# Patient Record
Sex: Male | Born: 1935
Health system: Southern US, Community
[De-identification: ages and names within clinical notes are randomized; demographics above are authoritative.]

## PROBLEM LIST (undated history)

## (undated) DIAGNOSIS — Z8711 Personal history of peptic ulcer disease: Secondary | ICD-10-CM

## (undated) DIAGNOSIS — N400 Enlarged prostate without lower urinary tract symptoms: Secondary | ICD-10-CM

## (undated) DIAGNOSIS — D494 Neoplasm of unspecified behavior of bladder: Secondary | ICD-10-CM

## (undated) DIAGNOSIS — IMO0001 Reserved for inherently not codable concepts without codable children: Secondary | ICD-10-CM

## (undated) DIAGNOSIS — Z8719 Personal history of other diseases of the digestive system: Secondary | ICD-10-CM

## (undated) DIAGNOSIS — E785 Hyperlipidemia, unspecified: Secondary | ICD-10-CM

## (undated) DIAGNOSIS — H9190 Unspecified hearing loss, unspecified ear: Secondary | ICD-10-CM

## (undated) DIAGNOSIS — I10 Essential (primary) hypertension: Secondary | ICD-10-CM

## (undated) DIAGNOSIS — M549 Dorsalgia, unspecified: Secondary | ICD-10-CM

## (undated) HISTORY — PX: TONSILLECTOMY: SUR1361

## (undated) HISTORY — DX: Hyperlipidemia, unspecified: E78.5

## (undated) HISTORY — DX: Essential (primary) hypertension: I10

## (undated) HISTORY — PX: CATARACT EXTRACTION W/ INTRAOCULAR LENS  IMPLANT, BILATERAL: SHX1307

---
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
## 2001-09-29 ENCOUNTER — Other Ambulatory Visit: Admission: RE | Admit: 2001-09-29 | Discharge: 2001-09-29 | Payer: Self-pay | Admitting: Internal Medicine

## 2004-12-24 ENCOUNTER — Ambulatory Visit: Payer: Self-pay | Admitting: Internal Medicine

## 2005-01-13 ENCOUNTER — Ambulatory Visit: Payer: Self-pay | Admitting: Internal Medicine

## 2005-01-20 ENCOUNTER — Encounter: Admission: RE | Admit: 2005-01-20 | Discharge: 2005-01-20 | Payer: Self-pay | Admitting: Internal Medicine

## 2009-12-27 ENCOUNTER — Encounter (INDEPENDENT_AMBULATORY_CARE_PROVIDER_SITE_OTHER): Payer: Self-pay | Admitting: *Deleted

## 2010-07-12 ENCOUNTER — Telehealth: Payer: Self-pay | Admitting: Internal Medicine

## 2011-01-10 ENCOUNTER — Encounter (INDEPENDENT_AMBULATORY_CARE_PROVIDER_SITE_OTHER): Payer: Self-pay | Admitting: *Deleted

## 2011-01-14 NOTE — Progress Notes (Signed)
Summary: Schedule Colonoscopy  Phone Note Outgoing Call Call back at Home Phone [phone]   Call placed by: Harlow Mares CMA Duncan Dull),  July 12, 2010 1:59 PM Call placed to: Patient Summary of Call: spoke to the pt and he will call back to schedule, he is not interested at this time.  Initial call taken by: Harlow Mares CMA (AAMA),  July 12, 2010 2:00 PM

## 2011-01-14 NOTE — Letter (Signed)
Summary: Colonoscopy Letter  [facility]  [address] [place], [place] [address]   Phone: [phone]  Fax: [phone]      December 27, 2009 MRN: [id number]   GABRYEL FILES [address] [place], [place]  [address]   Dear Mr. DOREN,   According to your medical record, it is time for you to schedule a Colonoscopy. The American Cancer Society recommends this procedure as a method to detect early colon cancer. Patients with a family history of colon cancer, or a personal history of colon polyps or inflammatory bowel disease are at increased risk.  This letter has beeen generated based on the recommendations made at the time of your procedure. If you feel that in your particular situation this may no longer apply, please contact our office.  Please call our office at [phone] to schedule this appointment or to update your records at your earliest convenience.  Thank you for cooperating with [place] to provide you with the very best care possible.   Sincerely,  Wilhemina Bonito. Marina Goodell, M.D.  [facility] [phone]

## 2011-01-16 NOTE — Letter (Signed)
Summary: Pre Visit Letter Revised  [facility]  [address] [place], [place] [address]   Phone: [phone]  Fax: [phone]        01/10/2011 MRN: [id number] Paul [name] [address] [place], [place]  [address]             Procedure Date:  02/11/2011 @ 10:30   Recall colon-Dr. Marina Goodell   Welcome to the Gastroenterology Division at [facility].    You are scheduled to see a nurse for your pre-procedure visit on 01/28/2011 at 11:00 on the 3rd floor at [facility], [address]  We ask that you try to arrive at our office 15 minutes prior to your appointment time to allow for check-in.  Please take a minute to review the attached form.  If you answer "Yes" to one or more of the questions on the first page, we ask that you call the person listed at your earliest opportunity.  If you answer "No" to all of the questions, please complete the rest of the form and bring it to your appointment.    Your nurse visit will consist of discussing your medical and surgical history, your immediate family medical history, and your medications.   If you are unable to list all of your medications on the form, please bring the medication bottles to your appointment and we will list them.  We will need to be aware of both prescribed and over the counter drugs.  We will need to know exact dosage information as well.    Please be prepared to read and sign documents such as consent forms, a financial agreement, and acknowledgement forms.  If necessary, and with your consent, a friend or relative is welcome to sit-in on the nurse visit with you.  Please bring your insurance card so that we may make a copy of it.  If your insurance requires a referral to see a specialist, please bring your referral form from your primary care physician.  No co-pay is required for this nurse visit.     If you cannot keep your appointment, please call [phone] to cancel or reschedule prior to  your appointment date.  This allows [place] the opportunity to schedule an appointment for another patient in need of care.    Thank you for choosing [facility] for your medical needs.  We appreciate the opportunity to care for you.  Please visit [place] at our website  to learn more about our practice.  Sincerely, The Gastroenterology Division

## 2011-01-27 ENCOUNTER — Encounter (INDEPENDENT_AMBULATORY_CARE_PROVIDER_SITE_OTHER): Payer: Self-pay | Admitting: *Deleted

## 2011-01-28 ENCOUNTER — Encounter: Payer: Self-pay | Admitting: Internal Medicine

## 2011-02-05 NOTE — Miscellaneous (Signed)
Summary: [facility] Previsit/prep  Clinical Lists Changes  Medications: Added new medication of MOVIPREP 100 GM  SOLR (PEG-KCL-NACL-NASULF-NA ASC-C) As per prep instructions. - Signed Rx of MOVIPREP 100 GM  SOLR (PEG-KCL-NACL-NASULF-NA ASC-C) As per prep instructions.;  #1 x 0;  Signed;  Entered by: [name] RN;  Authorized by: Hilarie Fredrickson MD;  Method used: Electronically to [facility] [address], [place], [place], [place]  [address], Ph: [phone], Fax: [phone] Observations: Added new observation of NKA: T (01/28/2011 10:47)    Prescriptions: MOVIPREP 100 GM  SOLR (PEG-KCL-NACL-NASULF-NA ASC-C) As per prep instructions.  #1 x 0   Entered by:   [name] RN   Authorized by:   Hilarie Fredrickson MD   Signed by:   [name] RN on 01/28/2011   Method used:   Electronically to        [facility] (retail)       [address]       [place], [place]  [address]       Ph: [phone]       Fax: [phone]   RxID:   [id number]

## 2011-02-05 NOTE — Letter (Signed)
Summary: [facility] Instructions  [facility]  [address] [place], [place] [address]   Phone: [phone]  Fax: [phone]       Paul [name]    [birth year]    MRN: [id number]        Procedure Day Dorna Bloom:  Paul [name]  02/11/11     Arrival Time:  9:30AM     Procedure Time:  10:30AM     Location of Procedure:                    _ X_  [facility] (4th Floor)  PREPARATION FOR COLONOSCOPY WITH MOVIPREP   Starting 5 days prior to your procedure 02/06/11 do not eat nuts, seeds, popcorn, corn, beans, peas,  salads, or any raw vegetables.  Do not take any fiber supplements (e.g. Metamucil, Citrucel, and Benefiber).  THE DAY BEFORE YOUR PROCEDURE         DATE: 02/10/11  DAY: MONDAY  1.  Drink clear liquids the entire day-NO SOLID FOOD  2.  Do not drink anything colored red or purple.  Avoid juices with pulp.  No orange juice.  3.  Drink at least 64 oz. (8 glasses) of fluid/clear liquids during the day to prevent dehydration and help the prep work efficiently.  CLEAR LIQUIDS INCLUDE: Water Jello Ice Popsicles Tea (sugar ok, no milk/cream) Powdered fruit flavored drinks Coffee (sugar ok, no milk/cream) Gatorade Juice: apple, white grape, white cranberry  Lemonade Clear bullion, consomm, broth Carbonated beverages (any kind) Strained chicken noodle soup Hard Candy                             4.  In the morning, mix first dose of MoviPrep solution:    Empty 1 Pouch A and 1 Pouch B into the disposable container    Add lukewarm drinking water to the top line of the container. Mix to dissolve    Refrigerate (mixed solution should be used within 24 hrs)  5.  Begin drinking the prep at 5:00 p.m. The MoviPrep container is divided by 4 marks.   Every 15 minutes drink the solution down to the next mark (approximately 8 oz) until the full liter is complete.   6.  Follow completed prep with 16 oz of clear liquid of your choice (Nothing red or purple).   Continue to drink clear liquids until bedtime.  7.  Before going to bed, mix second dose of MoviPrep solution:    Empty 1 Pouch A and 1 Pouch B into the disposable container    Add lukewarm drinking water to the top line of the container. Mix to dissolve    Refrigerate  THE DAY OF YOUR PROCEDURE      DATE: 02/11/11   DAY: TUESDAY  Beginning at 5:30AM (5 hours before procedure):         1. Every 15 minutes, drink the solution down to the next mark (approx 8 oz) until the full liter is complete.  2. Follow completed prep with 16 oz. of clear liquid of your choice.    3. You may drink clear liquids until 8:30AM (2 HOURS BEFORE PROCEDURE).   MEDICATION INSTRUCTIONS  Unless otherwise instructed, you should take regular prescription medications with a small sip of water   as early as possible the morning of your procedure.      OTHER INSTRUCTIONS  You will need a responsible adult at least 75 years  of age to accompany you and drive you home.   This person must remain in the waiting room during your procedure.  Wear loose fitting clothing that is easily removed.  Leave jewelry and other valuables at home.  However, you may wish to bring a book to read or  an iPod/MP3 player to listen to music as you wait for your procedure to start.  Remove all body piercing jewelry and leave at home.  Total time from sign-in until discharge is approximately 2-3 hours.  You should go home directly after your procedure and rest.  You can resume normal activities the  day after your procedure.  The day of your procedure you should not:   Drive   Make legal decisions   Operate machinery   Drink alcohol   Return to work  You will receive specific instructions about eating, activities and medications before you leave.    The above instructions have been reviewed and explained to me by  [name] RN  January 28, 2011 11:10 AM     I fully understand and can verbalize these  instructions _____________________________ Date _________

## 2011-02-11 ENCOUNTER — Other Ambulatory Visit: Payer: Self-pay | Admitting: Internal Medicine

## 2011-02-11 ENCOUNTER — Other Ambulatory Visit (AMBULATORY_SURGERY_CENTER): Payer: Medicare Other | Admitting: Internal Medicine

## 2011-02-11 DIAGNOSIS — Z86010 Personal history of colonic polyps: Secondary | ICD-10-CM

## 2011-02-11 DIAGNOSIS — D126 Benign neoplasm of colon, unspecified: Secondary | ICD-10-CM

## 2011-02-11 DIAGNOSIS — Z1211 Encounter for screening for malignant neoplasm of colon: Secondary | ICD-10-CM

## 2011-02-11 DIAGNOSIS — K5730 Diverticulosis of large intestine without perforation or abscess without bleeding: Secondary | ICD-10-CM

## 2011-02-17 ENCOUNTER — Encounter: Payer: Self-pay | Admitting: Internal Medicine

## 2011-02-20 NOTE — Procedures (Addendum)
Summary: Colonoscopy  Patient: Paul [name] Note: All result statuses are Final unless otherwise noted.  Tests: (1) Colonoscopy (COL)   COL Colonoscopy           DONE     [facility]     [address]     [place], [place]  [address]          COLONOSCOPY PROCEDURE REPORT          PATIENT:  [name], Paul  MR#:  [id number]     BIRTHDATE:  February 13, 1930, 80 yrs. old  GENDER:  male     ENDOSCOPIST:  Wilhemina Bonito. Eda Keys, MD     REF. BY:  Surveillance Program Recall,     PROCEDURE DATE:  02/11/2011     PROCEDURE:  Colonoscopy with snare polypectomy x 1     ASA CLASS:  Class II     INDICATIONS:  history of pre-cancerous (adenomatous) colon polyps,     surveillance and high-risk screening 2002, 2006 w/ TA     MEDICATIONS:   Fentanyl 50 mcg IV, Versed 5 mg IV          DESCRIPTION OF PROCEDURE:   After the risks benefits and     alternatives of the procedure were thoroughly explained, informed     consent was obtained.  Digital rectal exam was performed and     revealed no abnormalities.   The LB 180AL [id number] endoscope was     introduced through the anus and advanced to the cecum, which was     identified by both the appendix and ileocecal valve, without     limitations.Time to cecum =8:12 min.  The quality of the prep was     excellent, using MoviPrep.  The instrument was then slowly     withdrawn (time = 8:57 min) as the colon was fully examined.     <<PROCEDUREIMAGES>>          FINDINGS:  A diminutive polyp was found in the sigmoid colon.     Polyp was snared without cautery. Retrieval was successful.     Moderate diverticulosis was found in the left colon.  Otherwise     normal colonoscopy without other polyps, masses, vascular     ectasias, or inflammatory changes.   Retroflexed views in the     rectum revealed small  internal hemorrhoids.    The scope was then     withdrawn from the patient and the procedure completed.          COMPLICATIONS:  None       ENDOSCOPIC IMPRESSION:     1) Diminutive polyp in the sigmoid colon - removed     2) Moderate diverticulosis in the left colon     3) Otherwise normal colonoscopy     4) Internal hemorrhoids     RECOMMENDATIONS:     1) Return to the care of your primary provider. GI follow up as     needed          ______________________________     Wilhemina Bonito. Eda Keys, MD          CC:  Rodrigo Ran, MD;  The Patient          n.     eSIGNED:   Wilhemina Bonito. Eda Keys at 02/11/2011 11:57 AM          Coral Spikes, [id number]  Note: An exclamation mark (!) indicates a result that was not dispersed into  the flowsheet. Document Creation Date: 02/11/2011 11:58 AM _______________________________________________________________________  (1) Order result status: Final Collection or observation date-time: 02/11/2011 11:46 Requested date-time:  Receipt date-time:  Reported date-time:  Referring Physician:   Ordering Physician: Fransico Setters [phone]) Specimen Source:  Source: Launa Grill Order Number: [id number] Lab site:

## 2011-02-25 NOTE — Letter (Signed)
Summary: Patient Notice- Polyp Results  [facility]  [address] [place], [place] [address]   Phone: [phone]  Fax: [phone]        February 17, 2011 MRN: [id number]    Paul [name] [address] [place], [place]  [address]    Dear [name],  I am pleased to inform you that the colon polyp(s) removed during your recent colonoscopy was (were) found to be benign (no cancer detected) upon pathologic examination.    Additional information/recommendations:  __ No further action with gastroenterology is needed at this time. Please      follow-up with your primary care physician for your other healthcare      needs.  _  Please call us if you are having persistent problems or have questions about your condition that have not been fully answered at this time.  Sincerely,  Hilarie Fredrickson MD  This letter has been electronically signed by your physician.  Appended Document: Patient Notice- Polyp Results letter mailed

## 2012-01-16 ENCOUNTER — Other Ambulatory Visit: Payer: Self-pay | Admitting: Urology

## 2012-01-17 MED ORDER — MITOMYCIN CHEMO FOR BLADDER INSTILLATION 40 MG: 40.0000 mg | Freq: Once | INTRAVENOUS | Status: DC

## 2012-01-21 ENCOUNTER — Encounter (HOSPITAL_BASED_OUTPATIENT_CLINIC_OR_DEPARTMENT_OTHER): Payer: Self-pay | Admitting: *Deleted

## 2012-01-21 NOTE — Progress Notes (Signed)
NPO AFTER MN. ARRIVES AT 1015. NEEDS ISTAT AND EKG. MAY TAKE TYLENOL IF NEEDED W/ SIP OF WATER.

## 2012-01-25 DIAGNOSIS — C679 Malignant neoplasm of bladder, unspecified: Secondary | ICD-10-CM

## 2012-01-26 ENCOUNTER — Encounter (HOSPITAL_BASED_OUTPATIENT_CLINIC_OR_DEPARTMENT_OTHER): Payer: Self-pay | Admitting: Anesthesiology

## 2012-01-26 ENCOUNTER — Ambulatory Visit (HOSPITAL_BASED_OUTPATIENT_CLINIC_OR_DEPARTMENT_OTHER)
Admission: RE | Admit: 2012-01-26 | Discharge: 2012-01-26 | Disposition: A | Discharge status: Home / Self Care | Payer: Medicare Other | Source: Ambulatory Visit | Attending: Urology | Admitting: Urology

## 2012-01-26 ENCOUNTER — Ambulatory Visit (HOSPITAL_BASED_OUTPATIENT_CLINIC_OR_DEPARTMENT_OTHER): Payer: Medicare Other | Admitting: Anesthesiology

## 2012-01-26 ENCOUNTER — Other Ambulatory Visit: Payer: Self-pay

## 2012-01-26 ENCOUNTER — Encounter (HOSPITAL_BASED_OUTPATIENT_CLINIC_OR_DEPARTMENT_OTHER)
Admission: RE | Disposition: A | Discharge status: Home / Self Care | Payer: Self-pay | Source: Ambulatory Visit | Attending: Urology

## 2012-01-26 ENCOUNTER — Other Ambulatory Visit: Payer: Self-pay | Admitting: Urology

## 2012-01-26 ENCOUNTER — Encounter (HOSPITAL_BASED_OUTPATIENT_CLINIC_OR_DEPARTMENT_OTHER): Payer: Self-pay | Admitting: *Deleted

## 2012-01-26 DIAGNOSIS — I10 Essential (primary) hypertension: Secondary | ICD-10-CM | POA: Insufficient documentation

## 2012-01-26 DIAGNOSIS — Z7982 Long term (current) use of aspirin: Secondary | ICD-10-CM | POA: Insufficient documentation

## 2012-01-26 DIAGNOSIS — C679 Malignant neoplasm of bladder, unspecified: Secondary | ICD-10-CM

## 2012-01-26 DIAGNOSIS — E7800 Pure hypercholesterolemia, unspecified: Secondary | ICD-10-CM | POA: Insufficient documentation

## 2012-01-26 DIAGNOSIS — Z79899 Other long term (current) drug therapy: Secondary | ICD-10-CM | POA: Insufficient documentation

## 2012-01-26 DIAGNOSIS — R3129 Other microscopic hematuria: Secondary | ICD-10-CM | POA: Insufficient documentation

## 2012-01-26 HISTORY — DX: Unspecified hearing loss, unspecified ear: H91.90

## 2012-01-26 HISTORY — DX: Neoplasm of unspecified behavior of bladder: D49.4

## 2012-01-26 HISTORY — DX: Benign prostatic hyperplasia without lower urinary tract symptoms: N40.0

## 2012-01-26 HISTORY — PX: TRANSURETHRAL RESECTION OF BLADDER TUMOR: SHX2575

## 2012-01-26 HISTORY — DX: Personal history of peptic ulcer disease: Z87.11

## 2012-01-26 HISTORY — DX: Personal history of other diseases of the digestive system: Z87.19

## 2012-01-26 HISTORY — DX: Reserved for inherently not codable concepts without codable children: IMO0001

## 2012-01-26 HISTORY — DX: Dorsalgia, unspecified: M54.9

## 2012-01-26 LAB — POCT I-STAT 4, (NA,K, GLUC, HGB,HCT)
Glucose, Bld: 100 mg/dL — ABNORMAL HIGH (ref 70–99)
HCT: 42 % (ref 39.0–52.0)
Hemoglobin: 14.3 g/dL (ref 13.0–17.0)
Potassium: 4.2 mEq/L (ref 3.5–5.1)
Sodium: 141 mEq/L (ref 135–145)

## 2012-01-26 SURGERY — TURBT (TRANSURETHRAL RESECTION OF BLADDER TUMOR)
Anesthesia: General | Site: Bladder | Wound class: Clean Contaminated

## 2012-01-26 MED ORDER — OXYBUTYNIN CHLORIDE 5 MG PO TABS: 5.0000 mg | ORAL_TABLET | Freq: Once | ORAL | Status: DC

## 2012-01-26 MED ORDER — PHENAZOPYRIDINE HCL 200 MG PO TABS: 200.0000 mg | ORAL_TABLET | Freq: Three times a day (TID) | ORAL | Status: AC | PRN

## 2012-01-26 MED ORDER — STERILE WATER FOR IRRIGATION IR SOLN
Status: DC | PRN
  Administered 2012-01-26: 10 mL

## 2012-01-26 MED ORDER — PROPOFOL 10 MG/ML IV EMUL
INTRAVENOUS | Status: DC | PRN
  Administered 2012-01-26: 190 mg via INTRAVENOUS

## 2012-01-26 MED ORDER — PHENAZOPYRIDINE HCL 200 MG PO TABS
200.0000 mg | ORAL_TABLET | Freq: Once | ORAL | Status: AC
  Administered 2012-01-26: 200 mg via ORAL

## 2012-01-26 MED ORDER — SODIUM CHLORIDE 0.9 % IR SOLN
Status: DC | PRN
  Administered 2012-01-26: 6000 mL

## 2012-01-26 MED ORDER — MITOMYCIN CHEMO FOR BLADDER INSTILLATION 40 MG
40.0000 mg | Freq: Once | INTRAVENOUS | Status: AC
  Administered 2012-01-26: 40 mg via INTRAVESICAL
  Filled 2012-01-26: qty 40

## 2012-01-26 MED ORDER — HYDROCODONE-ACETAMINOPHEN 10-325 MG PO TABS
1.0000 | ORAL_TABLET | Freq: Four times a day (QID) | ORAL | Status: DC | PRN
  Administered 2012-01-26: 1 via ORAL

## 2012-01-26 MED ORDER — LACTATED RINGERS IV SOLN: INTRAVENOUS | Status: DC

## 2012-01-26 MED ORDER — MEPERIDINE HCL 25 MG/ML IJ SOLN: 6.2500 mg | INTRAMUSCULAR | Status: DC | PRN

## 2012-01-26 MED ORDER — ONDANSETRON HCL 4 MG/2ML IJ SOLN
INTRAMUSCULAR | Status: DC | PRN
  Administered 2012-01-26: 4 mg via INTRAVENOUS

## 2012-01-26 MED ORDER — PROMETHAZINE HCL 25 MG/ML IJ SOLN: 6.2500 mg | INTRAMUSCULAR | Status: DC | PRN

## 2012-01-26 MED ORDER — CIPROFLOXACIN IN D5W 200 MG/100ML IV SOLN
200.0000 mg | INTRAVENOUS | Status: AC
  Administered 2012-01-26: 200 mg via INTRAVENOUS

## 2012-01-26 MED ORDER — HYDROCODONE-ACETAMINOPHEN 10-300 MG PO TABS: 1.0000 | ORAL_TABLET | Freq: Four times a day (QID) | ORAL | Status: DC | PRN

## 2012-01-26 MED ORDER — LACTATED RINGERS IV SOLN
INTRAVENOUS | Status: DC
  Administered 2012-01-26 (×2): via INTRAVENOUS

## 2012-01-26 MED ORDER — FENTANYL CITRATE 0.05 MG/ML IJ SOLN: 25.0000 ug | INTRAMUSCULAR | Status: DC | PRN

## 2012-01-26 MED ORDER — LIDOCAINE HCL (CARDIAC) 20 MG/ML IV SOLN
INTRAVENOUS | Status: DC | PRN
  Administered 2012-01-26: 80 mg via INTRAVENOUS

## 2012-01-26 MED ORDER — FENTANYL CITRATE 0.05 MG/ML IJ SOLN
INTRAMUSCULAR | Status: DC | PRN
  Administered 2012-01-26: 50 ug via INTRAVENOUS

## 2012-01-26 MED ORDER — TAMSULOSIN HCL 0.4 MG PO CAPS: 0.4000 mg | ORAL_CAPSULE | Freq: Once | ORAL | Status: DC

## 2012-01-26 SURGICAL SUPPLY — 30 items
BAG DRAIN URO-CYSTO SKYTR STRL (DRAIN) ×2 IMPLANT
BAG URINE DRAINAGE (UROLOGICAL SUPPLIES) ×2 IMPLANT
BAG URINE LEG 19OZ MD ST LTX (BAG) IMPLANT
CANISTER SUCT LVC 12 LTR MEDI- (MISCELLANEOUS) ×2 IMPLANT
CATH COUDE FOLEY 2W 5CC 20FR (CATHETERS) ×2 IMPLANT
CATH FOLEY 2WAY SLVR  5CC 20FR (CATHETERS)
CATH FOLEY 2WAY SLVR  5CC 22FR (CATHETERS)
CATH FOLEY 2WAY SLVR  5CC 24FR (CATHETERS) ×1
CATH FOLEY 2WAY SLVR 5CC 20FR (CATHETERS) IMPLANT
CATH FOLEY 2WAY SLVR 5CC 22FR (CATHETERS) IMPLANT
CATH FOLEY 2WAY SLVR 5CC 24FR (CATHETERS) ×1 IMPLANT
CLOTH BEACON ORANGE TIMEOUT ST (SAFETY) ×2 IMPLANT
DRAPE CAMERA CLOSED 9X96 (DRAPES) ×2 IMPLANT
ELECT BUTTON BIOP 24F 90D PLAS (MISCELLANEOUS) IMPLANT
ELECT LOOP HF 26F 30D .35MM (CUTTING LOOP) IMPLANT
ELECT REM PT RETURN 9FT ADLT (ELECTROSURGICAL) ×2
ELECTRODE REM PT RTRN 9FT ADLT (ELECTROSURGICAL) ×1 IMPLANT
EVACUATOR MICROVAS BLADDER (UROLOGICAL SUPPLIES) ×2 IMPLANT
GLOVE BIO SURGEON STRL SZ8 (GLOVE) ×2 IMPLANT
GOWN PREVENTION PLUS LG XLONG (DISPOSABLE) ×2 IMPLANT
GOWN STRL REIN XL XLG (GOWN DISPOSABLE) ×2 IMPLANT
HOLDER FOLEY CATH W/STRAP (MISCELLANEOUS) ×2 IMPLANT
IV NS IRRIG 3000ML ARTHROMATIC (IV SOLUTION) ×6 IMPLANT
KIT ASPIRATION TUBING (SET/KITS/TRAYS/PACK) ×2 IMPLANT
LOOP CUTTING 24FR OLYMPUS (CUTTING LOOP) ×2 IMPLANT
PACK CYSTOSCOPY (CUSTOM PROCEDURE TRAY) ×2 IMPLANT
PLUG CATH AND CAP STER (CATHETERS) IMPLANT
SET ASPIRATION TUBING (TUBING) IMPLANT
SYRINGE IRR TOOMEY STRL 70CC (SYRINGE) ×2 IMPLANT
WATER STERILE IRR 3000ML UROMA (IV SOLUTION) ×2 IMPLANT

## 2012-01-26 NOTE — Transfer of Care (Signed)
Immediate Anesthesia Transfer of Care Note  Patient: Paul [name]  Procedure(s) Performed:  TRANSURETHRAL RESECTION OF BLADDER TUMOR (TURBT) - GYRUS MYTOMICIN C   Patient Location: PACU  Anesthesia Type: General  Level of Consciousness: sedated  Airway & Oxygen Therapy: Patient Spontanous Breathing and Patient connected to nasal cannula oxygen  Post-op Assessment: Report given to PACU RN and Post -op Vital signs reviewed and stable  Post vital signs: Reviewed and stable  Complications: No apparent anesthesia complications

## 2012-01-26 NOTE — Op Note (Signed)
PATIENT:  Coral Spikes  PRE-OPERATIVE DIAGNOSIS: Bladder tumor  POST-OPERATIVE DIAGNOSIS: Same  PROCEDURE:  Procedure(s): TRANSURETHRAL RESECTION OF BLADDER TUMOR (TURBT) (1.6cm.)  SURGEON:  Surgeon(s): Garnett Farm  ANESTHESIA:   General  EBL:  less than 50 mL  DRAINS: Urinary Catheter (20 Fr. Foley)   SPECIMEN:  Source of Specimen: 1. Bladder tumor 2. Base of bladder tumor  DISPOSITION OF SPECIMEN:  PATHOLOGY  Indication:  Mr. Delker is a 78 year old male patient who was referred for microscopic hematuria. He was found on evaluation to have no abnormality of the kidneys or ureters. Cystoscopically I noted a tumor on the superior wall of the bladder on the left-hand side. We therefore discussed resection of the tumor for staging and grading purposes.  Description of operation: The patient was taken to the operating room and administered general anesthesia. He was then placed on the table and moved to the dorsal lithotomy position after which his genitalia was sterilely prepped and draped. An official timeout was then performed.  I noted his urethral meatus was somewhat snug and therefore dilated the meatus with [employer] sounds from 24 up to 28 [place]. The 26 French resectoscope with Timberlake obturator was then introduced into the bladder and the obturator was removed. The resectoscope element with 12 lens was then inserted and the bladder was fully and systematically inspected. Ureteral orifices were noted to be in the normal anatomic positions. 2+ trabeculation was noted. The tumor was identified on the superior wall bladder on the left-hand side. No other lesions were identified within the bladder.  I first began by resecting the papillary portion of the tumor down to the bladder wall. I then used the Microvasive evacuator to remove all the portions of the bladder tumor that were resected. I then resected the base of the bladder tumor and fulgurated the base as well as  surrounding mucosa. Reinspection of the bladder revealed all obvious tumor had been fully resected and there was no evidence of perforation. The Microvasive evacuator was then used to irrigate the bladder and remove all of the portions of tissue which were sent to pathology. I then removed the resectoscope.  A 20 French Foley catheter was then inserted in the bladder and irrigated. The irrigant returned slightly pink with no clots. The catheter was placed on mild traction. The patient was taken to recovery room in stable and satisfactory condition. He tolerated procedure well and there were no intraoperative complications.   In the recovery room he was instilled with 40 mg of mitomycin-C in 40 cc of water and the catheter was plugged. This will remain indwelling for approximately one hour. It will then be drained from the bladder and the catheter will be removed and the patient discharged home.  PLAN OF CARE: Discharge to home after PACU  PATIENT DISPOSITION:  PACU - hemodynamically stable.

## 2012-01-26 NOTE — Anesthesia Procedure Notes (Signed)
Procedure Name: LMA Insertion Performed by: Helyne Genther Pre-anesthesia Checklist: Patient identified, Emergency Drugs available, Suction available and Patient being monitored Patient Re-evaluated:Patient Re-evaluated prior to inductionOxygen Delivery Method: Circle System Utilized Preoxygenation: Pre-oxygenation with 100% oxygen Intubation Type: IV induction Ventilation: Mask ventilation without difficulty LMA: LMA inserted LMA Size: 4.0 Number of attempts: 1 Placement Confirmation: positive ETCO2 Tube secured with: Tape Dental Injury: Teeth and Oropharynx as per pre-operative assessment      

## 2012-01-26 NOTE — Anesthesia Preprocedure Evaluation (Addendum)
Anesthesia Evaluation  Patient identified by MRN, date of birth, ID band Patient awake    Reviewed: Allergy & Precautions, H&P , NPO status , Patient's Chart, lab work & pertinent test results  Airway Mallampati: II TM Distance: >3 FB Neck ROM: Full    Dental No notable dental hx. (+) Partial Lower and Partial Upper   Pulmonary neg pulmonary ROS,  clear to auscultation  Pulmonary exam normal       Cardiovascular neg cardio ROS Regular Normal    Neuro/Psych Negative Neurological ROS  Negative Psych ROS   GI/Hepatic negative GI ROS, Neg liver ROS,   Endo/Other  Negative Endocrine ROS  Renal/GU negative Renal ROS  Genitourinary negative   Musculoskeletal negative musculoskeletal ROS (+)   Abdominal   Peds negative pediatric ROS (+)  Hematology negative hematology ROS (+)   Anesthesia Other Findings   Reproductive/Obstetrics negative OB ROS                          Anesthesia Physical Anesthesia Plan  ASA: II  Anesthesia Plan: General   Post-op Pain Management:    Induction: Intravenous  Airway Management Planned:   Additional Equipment:   Intra-op Plan:   Post-operative Plan: Extubation in OR  Informed Consent: I have reviewed the patients History and Physical, chart, labs and discussed the procedure including the risks, benefits and alternatives for the proposed anesthesia with the patient or authorized representative who has indicated his/her understanding and acceptance.   Dental advisory given  Plan Discussed with: CRNA  Anesthesia Plan Comments:         Anesthesia Quick Evaluation

## 2012-01-26 NOTE — Anesthesia Postprocedure Evaluation (Signed)
  Anesthesia Post-op Note  Patient: Paul [name]  Procedure(s) Performed:  TRANSURETHRAL RESECTION OF BLADDER TUMOR (TURBT) - GYRUS MYTOMICIN C   Patient Location: PACU  Anesthesia Type: General  Level of Consciousness: awake and alert   Airway and Oxygen Therapy: Patient Spontanous Breathing  Post-op Pain: mild  Post-op Assessment: Post-op Vital signs reviewed, Patient's Cardiovascular Status Stable, Respiratory Function Stable, Patent Airway and No signs of Nausea or vomiting  Post-op Vital Signs: stable  Complications: No apparent anesthesia complications

## 2012-01-26 NOTE — H&P (Signed)
History of Present Illness          Paul [name] is a 78 year old male patient who returns for followup of persistent microscopic hematuria. He was found in 12/12 and a normal creatinine of 0.9, a serum calcium of 9.8 and a PSA on finasteride of 0.35. He has never seen any gross hematuria nor has he every been told he has had microscopic hematuria in the past. He has no history of kidney stones although his father has had kidney stones in the past. He has no voiding symptoms either irritative or obstructive at this time.   Interval history: Since I seen him last he has not noted any gross hematuria nor has he had any new irritative voiding symptoms or passed anything that he would describe his tissue or blood clots.   Past Medical History Problems  1. History of  Hypercholesterolemia 272.0 2. History of  Hypertension 401.9  Surgical History Problems  1. History of  Appendectomy 2. History of  Tonsillectomy  Current Meds 1. Aspirin 81 MG Oral Tablet; Therapy: (Recorded:17Jan2013) to 2. Finasteride 5 MG Oral Tablet; Therapy: (Recorded:17Jan2013) to 3. Lisinopril 20 MG Oral Tablet; Therapy: (Recorded:17Jan2013) to 4. Simvastatin 20 MG Oral Tablet; Therapy: (Recorded:17Jan2013) to 5. Zoloft 100 MG Oral Tablet; Therapy: (Recorded:17Jan2013) to  Allergies Medication  1. No Known Drug Allergies  Family History Problems  1. Family history of  Death In The Family Father 2. Family history of  Death In The Family Mother 3. Family history of  Family Health Status Children ___ Living Daughters 4. Family history of  Family Health Status Children ___ Living Sons 5. Family history of  Lung Cancer V16.1 6. Family history of  Nephrolithiasis  Social History Problems  1. Caffeine Use 2. Marital History - Currently Married 3. Never A Smoker 4. Occupation: Retired Nurse, children's  5. History of  Alcohol Use  Review of Systems Genitourinary, constitutional, skin, eye, otolaryngeal,  hematologic/lymphatic, cardiovascular, pulmonary, endocrine, musculoskeletal, gastrointestinal, neurological and psychiatric system(s) were reviewed and pertinent findings if present are noted.  Genitourinary: hematuria.  Gastrointestinal: diarrhea.  Hematologic/Lymphatic: a tendency to easily bruise.  Musculoskeletal: back pain and joint pain.  Neurological: dizziness.    Vitals Vital Signs BMI Calculated: 24.55 BSA Calculated: 1.87 Height: 5 ft 8 in Weight: 162 lb  Blood Pressure: 146 / 83 Heart Rate: 83  Physical Exam Constitutional: Well nourished and well developed . No acute distress.  ENT:. The ears and nose are normal in appearance.  Neck: The appearance of the neck is normal and no neck mass is present.  Pulmonary: No respiratory distress and normal respiratory rhythm and effort.  Cardiovascular: Heart rate and rhythm are normal . No peripheral edema.  Abdomen: The abdomen is soft and nontender. No masses are palpated. No CVA tenderness. No hernias are palpable. No hepatosplenomegaly noted.  Rectal: Rectal exam demonstrates normal sphincter tone, no tenderness and no masses. The prostate has no nodularity and is not tender. The left seminal vesicle is nonpalpable. The right seminal vesicle is nonpalpable. The perineum is normal on inspection.  Genitourinary: Examination of the penis demonstrates no discharge, no masses, no lesions and a normal meatus. The scrotum is without lesions. The right epididymis is palpably normal and non-tender. The left epididymis is palpably normal and non-tender. The right testis is non-tender and without masses. The left testis is non-tender and without masses.  Lymphatics: The femoral and inguinal nodes are not enlarged or tender.  Skin: Normal skin turgor, no visible rash and  no visible skin lesions.  Neuro/Psych:. Mood and affect are appropriate.   AU CT-HEMATURIA PROTOCOL 21Jan2013 12:00AM Paul [name]   Test Name Result Flag Reference  **  RADIOLOGY REPORT BY [facility] ** ORIGINAL APPROVED BY: Genevive Bi, M.D. ON: 01/05/2012 16:27:22   *RADIOLOGY REPORT*  Clinical Data: Microscopic hematuria.  CT ABDOMEN AND PELVIS WITHOUT AND WITH CONTRAST  Technique: Multidetector CT imaging of the abdomen and pelvis was performed without contrast material in one or both body regions, followed by contrast material(s) and further sections in one or both body regions.  Contrast: 125 ml Isovue  Comparison: None.  Findings:  Renal: No nephrolithiasis or ureterolithiasis. No enhancing renal cortical lesion are present. Delayed pyelogram phase imaging demonstrates no filling defects within the collecting systems or ureters. Within the left anterior wall the bladder, there is a rounded filling defect with frond like projections measuring 16 mm x 16 mm (image 72, series 6). The lesions enhances on the earlier contrast series (image 69, series 3).  There is lung bases are clear. There is a small hypodense lesion in the right hepatic lobe measuring 5 mm (image 22) which is too small to characterize. The gallbladder, pancreas, spleen, adrenal glands are normal.  The stomach, small bowel, and colon show no acute findings. There are diverticula of the sigmoid colon.  Abdominal aorta is heavily calcified but nonaneurysmal. No retroperitoneal lymphadenopathy.  Prostate gland is mildly enlarged at 53 mm. Enhancing lesion within the left anterior wall the bladder as described above. No pelvic lymphadenopathy. Review of bone windows demonstrates no aggressive osseous lesions.  IMPRESSION:  1.. Enhancing lesion within the left anterior wall of the bladder with frond like projections is concerning for a bladder neoplasm. Recommend cystoscopy for further evaluation. 2. No evidence of filling defects within the renal collecting systems ureters.  3. Small hypodense lesion within the right hepatic lobe is too small to  characterize. 4. No evidence of lymphadenopathy. 5. Prostate hypertrophy    Assessment Assessed  1. Working diagnosis of  Transitional Cell Carcinoma Of The Bladder 188.9 2. Benign Prostatic Hypertrophy With Urinary Obstruction 600.01   I went over his CT scan results with him today which is revealed no abnormality of the upper tract. Cystoscopically I found a bladder tumor on the posterior left wall bladder that appears to be a papillary transitional cell carcinoma. I went over the fact that this needs to be resected and that it is almost certainly malignant. The resection will allow both grading and staging of the tumor. I then went over the procedure in detail including its risks and complications. We discussed the alternatives and the probability of success. He understands and has elected to proceed.   Plan   1. I'm going to have him stop his aspirin until after the surgery. 2. He will be scheduled for outpatient transurethral resection of his bladder tumor.

## 2012-01-27 ENCOUNTER — Encounter (HOSPITAL_BASED_OUTPATIENT_CLINIC_OR_DEPARTMENT_OTHER): Payer: Self-pay | Admitting: Urology

## 2012-01-27 NOTE — Progress Notes (Signed)
Patient having difficulty passing his urine has called Dr. Vernie Ammons office  And spoke with his nurse

## 2012-02-05 ENCOUNTER — Other Ambulatory Visit: Payer: Self-pay | Admitting: Urology

## 2012-02-27 ENCOUNTER — Encounter (HOSPITAL_BASED_OUTPATIENT_CLINIC_OR_DEPARTMENT_OTHER): Payer: Self-pay | Admitting: *Deleted

## 2012-02-27 NOTE — Progress Notes (Signed)
To [facility] at 0615.Istat on arrival,Ekg in epic chart. Npo after mn.

## 2012-03-05 NOTE — H&P (Signed)
History of Present Illness     Transitional cell carcinoma of the bladder: He was evaluated for the source of microscopic hematuria and underwent a CT scan which revealed normal upper tracts and cystoscopy which revealed an obvious tumor in his bladder. Cystoscopically I noted a tumor on the left superior wall of the bladder which was resected on 01/26/12. His pathology revealed high-grade transitional cell carcinoma but there was no evidence of stromal or muscular invasion (Ta,G3).  Interval history:he has tolerated his catheter well. He returns today to undergo voiding trial after having been placed on an alpha-blocker in addition to his 5 alpha reductase inhibitor. He's been tolerating his catheter well.   Past Medical History Problems  1. History of  Acute Urinary Retention 788.20 2. History of  Hypercholesterolemia 272.0 3. History of  Hypertension 401.9  Surgical History Problems  1. History of  Appendectomy 2. History of  Tonsillectomy  Current Meds 1. Aspirin 81 MG Oral Tablet; Therapy: (Recorded:17Jan2013) to 2. Finasteride 5 MG Oral Tablet; Therapy: (Recorded:17Jan2013) to 3. Lisinopril 20 MG Oral Tablet; Therapy: (Recorded:17Jan2013) to 4. Simvastatin 20 MG Oral Tablet; Therapy: (Recorded:17Jan2013) to 5. Zoloft 100 MG Oral Tablet; Therapy: (Recorded:17Jan2013) to  Allergies Medication  1. No Known Drug Allergies  Family History Problems  1. Family history of  Death In The Family Father 2. Family history of  Death In The Family Mother 3. Family history of  Family Health Status Children ___ Living Daughters 4. Family history of  Family Health Status Children ___ Living Sons 5. Family history of  Lung Cancer V16.1 6. Family history of  Nephrolithiasis  Social History Problems  1. Caffeine Use 2. Marital History - Currently Married 3. Never A Smoker 4. Occupation: Retired Nurse, children's  5. History of  Alcohol Use  Review of Systems Genitourinary, constitutional, skin,  eye, otolaryngeal, hematologic/lymphatic, cardiovascular, pulmonary, endocrine, musculoskeletal, gastrointestinal, neurological and psychiatric system(s) were reviewed and pertinent findings if present are noted.  Genitourinary: hematuria.  Gastrointestinal: diarrhea.  Hematologic/Lymphatic: a tendency to easily bruise.  Musculoskeletal: back pain and joint pain.  Neurological: dizziness.    Vitals Vital Signs BMI Calculated: 24.55 BSA Calculated: 1.87 Height: 5 ft 8 in Weight: 162 lb  Blood Pressure: 146 / 83 Heart Rate: 83  Physical Exam Constitutional: Well nourished and well developed . No acute distress.  ENT:. The ears and nose are normal in appearance.  Neck: The appearance of the neck is normal and no neck mass is present.  Pulmonary: No respiratory distress and normal respiratory rhythm and effort.  Cardiovascular: Heart rate and rhythm are normal . No peripheral edema.  Abdomen: The abdomen is soft and nontender. No masses are palpated. No CVA tenderness. No hernias are palpable. No hepatosplenomegaly noted.  Rectal: Rectal exam demonstrates normal sphincter tone, no tenderness and no masses. The prostate has no nodularity and is not tender. The left seminal vesicle is nonpalpable. The right seminal vesicle is nonpalpable. The perineum is normal on inspection.  Genitourinary: Examination of the penis demonstrates no discharge, no masses, no lesions and a normal meatus. The scrotum is without lesions. The right epididymis is palpably normal and non-tender. The left epididymis is palpably normal and non-tender. The right testis is non-tender and without masses. The left testis is non-tender and without masses.  Lymphatics: The femoral and inguinal nodes are not enlarged or tender.  Skin: Normal skin turgor, no visible rash and no visible skin lesions.  Neuro/Psych:. Mood and affect are appropriate.    Results/Data  The following images/tracing/specimen were independently  visualized:  Flow rate as below.  Flow Rate: Instilled volume 250 ml . Voided 214 ml. A peak flow rate of 36ml/s, mean flow rate of 53ml/s and Bell-shaped flow curve .    Assessment Assessed  1. Acute Urinary Retention 788.20 2. Transitional Cell Carcinoma Of The Bladder 188.9 3. Benign Prostatic Hypertrophy With Urinary Obstruction 600.01      We will go over his pathology report today which has revealed high-grade transitional cell carcinoma with no evidence of stromal invasion (Ta,G3). He received mitomycin-C postoperatively. We discussed the fact that with a high grade tumor such as this and he repeat biopsy of the bladder at the site of resection is indicated in order to both be sure of complete clearance of the tumor as well as document the absence of muscular invasion. If in fact there is no evidence of invasion then he would benefit from an induction course of BCG followed by maintenance.  Because he developed urinary retention I have recommended he remain on tamsulosin in addition to his finasteride. We then discussed the fact that when I go back in for my repeat biopsy of his bladder there is again the risk of retention although that risk would probably be less since he is on an alpha-blocker as well. We discussed transurethral resection of the prostate which I think is more than is necessary but he would very possibly benefit from a transurethral incision of his prostate and we discussed that procedure today. I went over its potential risks and complications and he would like to proceed with that at the time of his repeat bladder biopsy.   Plan Benign Prostatic Hypertrophy With Urinary Obstruction (600.01)     1. Cipro 500 mg for 2 doses. 2. He'll be scheduled for repeat bladder biopsy and transurethral incision of his prostate as an outpatient.

## 2012-03-08 ENCOUNTER — Ambulatory Visit (HOSPITAL_BASED_OUTPATIENT_CLINIC_OR_DEPARTMENT_OTHER)
Admission: RE | Admit: 2012-03-08 | Discharge: 2012-03-08 | Disposition: A | Discharge status: Home / Self Care | Payer: Medicare Other | Source: Ambulatory Visit | Attending: Urology | Admitting: Urology

## 2012-03-08 ENCOUNTER — Encounter (HOSPITAL_BASED_OUTPATIENT_CLINIC_OR_DEPARTMENT_OTHER): Payer: Self-pay | Admitting: *Deleted

## 2012-03-08 ENCOUNTER — Encounter (HOSPITAL_BASED_OUTPATIENT_CLINIC_OR_DEPARTMENT_OTHER): Payer: Self-pay | Admitting: Anesthesiology

## 2012-03-08 ENCOUNTER — Encounter (HOSPITAL_BASED_OUTPATIENT_CLINIC_OR_DEPARTMENT_OTHER)
Admission: RE | Disposition: A | Discharge status: Home / Self Care | Payer: Self-pay | Source: Ambulatory Visit | Attending: Urology

## 2012-03-08 ENCOUNTER — Ambulatory Visit (HOSPITAL_BASED_OUTPATIENT_CLINIC_OR_DEPARTMENT_OTHER): Payer: Medicare Other | Admitting: Anesthesiology

## 2012-03-08 DIAGNOSIS — E7800 Pure hypercholesterolemia, unspecified: Secondary | ICD-10-CM | POA: Insufficient documentation

## 2012-03-08 DIAGNOSIS — N138 Other obstructive and reflux uropathy: Secondary | ICD-10-CM | POA: Insufficient documentation

## 2012-03-08 DIAGNOSIS — N401 Enlarged prostate with lower urinary tract symptoms: Secondary | ICD-10-CM | POA: Insufficient documentation

## 2012-03-08 DIAGNOSIS — Z79899 Other long term (current) drug therapy: Secondary | ICD-10-CM | POA: Insufficient documentation

## 2012-03-08 DIAGNOSIS — C679 Malignant neoplasm of bladder, unspecified: Secondary | ICD-10-CM

## 2012-03-08 DIAGNOSIS — I10 Essential (primary) hypertension: Secondary | ICD-10-CM | POA: Insufficient documentation

## 2012-03-08 DIAGNOSIS — Z7982 Long term (current) use of aspirin: Secondary | ICD-10-CM | POA: Insufficient documentation

## 2012-03-08 HISTORY — PX: TRANSURETHRAL INCISION OF PROSTATE: SHX2573

## 2012-03-08 HISTORY — PX: CYSTOSCOPY WITH BIOPSY: SHX5122

## 2012-03-08 LAB — POCT I-STAT 4, (NA,K, GLUC, HGB,HCT)
Glucose, Bld: 102 mg/dL — ABNORMAL HIGH (ref 70–99)
HCT: 42 % (ref 39.0–52.0)
Hemoglobin: 14.3 g/dL (ref 13.0–17.0)
Potassium: 3.8 mEq/L (ref 3.5–5.1)
Sodium: 144 mEq/L (ref 135–145)

## 2012-03-08 SURGERY — CYSTOSCOPY, WITH BIOPSY
Anesthesia: General | Site: Bladder | Wound class: Clean Contaminated

## 2012-03-08 MED ORDER — FENTANYL CITRATE 0.05 MG/ML IJ SOLN
INTRAMUSCULAR | Status: DC | PRN
  Administered 2012-03-08: 25 ug via INTRAVENOUS
  Administered 2012-03-08: 50 ug via INTRAVENOUS
  Administered 2012-03-08: 25 ug via INTRAVENOUS

## 2012-03-08 MED ORDER — HYDROCODONE-ACETAMINOPHEN 10-300 MG PO TABS: 1.0000 | ORAL_TABLET | Freq: Four times a day (QID) | ORAL | Status: DC | PRN

## 2012-03-08 MED ORDER — LACTATED RINGERS IV SOLN
INTRAVENOUS | Status: DC
  Administered 2012-03-08 (×3): via INTRAVENOUS

## 2012-03-08 MED ORDER — SODIUM CHLORIDE 0.9 % IR SOLN
Status: DC | PRN
  Administered 2012-03-08: 3000 mL

## 2012-03-08 MED ORDER — FENTANYL CITRATE 0.05 MG/ML IJ SOLN: 25.0000 ug | INTRAMUSCULAR | Status: DC | PRN

## 2012-03-08 MED ORDER — MEPERIDINE HCL 25 MG/ML IJ SOLN: 6.2500 mg | INTRAMUSCULAR | Status: DC | PRN

## 2012-03-08 MED ORDER — LACTATED RINGERS IV SOLN: INTRAVENOUS | Status: DC

## 2012-03-08 MED ORDER — CIPROFLOXACIN IN D5W 200 MG/100ML IV SOLN
200.0000 mg | INTRAVENOUS | Status: AC
  Administered 2012-03-08: 200 mg via INTRAVENOUS

## 2012-03-08 MED ORDER — PHENAZOPYRIDINE HCL 200 MG PO TABS
200.0000 mg | ORAL_TABLET | Freq: Once | ORAL | Status: AC
  Administered 2012-03-08: 200 mg via ORAL

## 2012-03-08 MED ORDER — LIDOCAINE HCL (CARDIAC) 20 MG/ML IV SOLN
INTRAVENOUS | Status: DC | PRN
  Administered 2012-03-08: 80 mg via INTRAVENOUS

## 2012-03-08 MED ORDER — PROPOFOL 10 MG/ML IV EMUL
INTRAVENOUS | Status: DC | PRN
  Administered 2012-03-08: 200 mg via INTRAVENOUS

## 2012-03-08 MED ORDER — PHENAZOPYRIDINE HCL 200 MG PO TABS: 200.0000 mg | ORAL_TABLET | Freq: Three times a day (TID) | ORAL | Status: AC | PRN

## 2012-03-08 MED ORDER — EPHEDRINE SULFATE 50 MG/ML IJ SOLN
INTRAMUSCULAR | Status: DC | PRN
  Administered 2012-03-08: 10 mg via INTRAVENOUS

## 2012-03-08 MED ORDER — PROMETHAZINE HCL 25 MG/ML IJ SOLN: 6.2500 mg | INTRAMUSCULAR | Status: DC | PRN

## 2012-03-08 SURGICAL SUPPLY — 35 items
BAG DRAIN URO-CYSTO SKYTR STRL (DRAIN) ×2 IMPLANT
BAG URINE DRAINAGE (UROLOGICAL SUPPLIES) IMPLANT
BAG URINE LEG 19OZ MD ST LTX (BAG) IMPLANT
CANISTER SUCT LVC 12 LTR MEDI- (MISCELLANEOUS) ×4 IMPLANT
CATH FOLEY 2WAY SLVR  5CC 20FR (CATHETERS)
CATH FOLEY 2WAY SLVR  5CC 22FR (CATHETERS)
CATH FOLEY 2WAY SLVR  5CC 24FR (CATHETERS)
CATH FOLEY 2WAY SLVR 5CC 20FR (CATHETERS) IMPLANT
CATH FOLEY 2WAY SLVR 5CC 22FR (CATHETERS) IMPLANT
CATH FOLEY 2WAY SLVR 5CC 24FR (CATHETERS) IMPLANT
CLOTH BEACON ORANGE TIMEOUT ST (SAFETY) ×2 IMPLANT
DRAPE CAMERA CLOSED 9X96 (DRAPES) ×2 IMPLANT
ELECT BUTTON BIOP 24F 90D PLAS (MISCELLANEOUS) IMPLANT
ELECT LOOP HF 26F 30D .35MM (CUTTING LOOP) IMPLANT
ELECT NEEDLE 45D HF 24-28F 12D (CUTTING LOOP) IMPLANT
ELECT REM PT RETURN 9FT ADLT (ELECTROSURGICAL)
ELECT RESECT VAPORIZE 12D CBL (ELECTRODE) ×2 IMPLANT
ELECTRODE REM PT RTRN 9FT ADLT (ELECTROSURGICAL) IMPLANT
EVACUATOR MICROVAS BLADDER (UROLOGICAL SUPPLIES) IMPLANT
GLOVE BIO SURGEON STRL SZ8 (GLOVE) ×2 IMPLANT
GLOVE BIOGEL M 6.5 STRL (GLOVE) ×2 IMPLANT
GLOVE INDICATOR 6.5 STRL GRN (GLOVE) ×2 IMPLANT
GOWN PREVENTION PLUS LG XLONG (DISPOSABLE) ×2 IMPLANT
GOWN STRL REIN XL XLG (GOWN DISPOSABLE) ×2 IMPLANT
GOWN XL W/COTTON TOWEL STD (GOWNS) ×2 IMPLANT
HOLDER FOLEY CATH W/STRAP (MISCELLANEOUS) IMPLANT
IV NS IRRIG 3000ML ARTHROMATIC (IV SOLUTION) ×4 IMPLANT
KIT ASPIRATION TUBING (SET/KITS/TRAYS/PACK) ×2 IMPLANT
LOOP CUTTING 24FR OLYMPUS (CUTTING LOOP) IMPLANT
LOOP ELECTRODE 28FR (MISCELLANEOUS) IMPLANT
NEEDLE HYPO 22GX1.5 SAFETY (NEEDLE) IMPLANT
NS IRRIG 500ML POUR BTL (IV SOLUTION) ×2 IMPLANT
PACK CYSTOSCOPY (CUSTOM PROCEDURE TRAY) ×2 IMPLANT
PLUG CATH AND CAP STER (CATHETERS) IMPLANT
WATER STERILE IRR 3000ML UROMA (IV SOLUTION) IMPLANT

## 2012-03-08 NOTE — Anesthesia Preprocedure Evaluation (Signed)
Anesthesia Evaluation  Patient identified by MRN, date of birth, ID band Patient awake    Reviewed: Allergy & Precautions, H&P , NPO status , Patient's Chart, lab work & pertinent test results  Airway Mallampati: II TM Distance: >3 FB Neck ROM: Full    Dental No notable dental hx. (+) Partial Lower and Partial Upper   Pulmonary neg pulmonary ROS,  breath sounds clear to auscultation  Pulmonary exam normal       Cardiovascular negative cardio ROS  Rhythm:Regular Rate:Normal     Neuro/Psych negative neurological ROS  negative psych ROS   GI/Hepatic negative GI ROS, Neg liver ROS,   Endo/Other  negative endocrine ROS  Renal/GU negative Renal ROS  negative genitourinary   Musculoskeletal negative musculoskeletal ROS (+)   Abdominal   Peds negative pediatric ROS (+)  Hematology negative hematology ROS (+)   Anesthesia Other Findings   Reproductive/Obstetrics negative OB ROS                           Anesthesia Physical  Anesthesia Plan  ASA: II  Anesthesia Plan: General   Post-op Pain Management:    Induction: Intravenous  Airway Management Planned: LMA  Additional Equipment:   Intra-op Plan:   Post-operative Plan:   Informed Consent: I have reviewed the patients History and Physical, chart, labs and discussed the procedure including the risks, benefits and alternatives for the proposed anesthesia with the patient or authorized representative who has indicated his/her understanding and acceptance.   Dental advisory given  Plan Discussed with: CRNA  Anesthesia Plan Comments:         Anesthesia Quick Evaluation

## 2012-03-08 NOTE — Interval H&P Note (Signed)
History and Physical Interval Note:  03/08/2012 7:21 AM  Paul [name]  has presented today for surgery, with the diagnosis of bladder cancer, bph  The various methods of treatment have been discussed with the patient and family. After consideration of risks, benefits and other options for treatment, the patient has consented to  Procedure(s) (LRB): CYSTOSCOPY WITH BIOPSY (N/A) TRANSURETHRAL INCISION OF THE PROSTATE (TUIP) (N/A) as a surgical intervention .  The patients' history has been reviewed, patient examined, no change in status, stable for surgery.  I have reviewed the patients' chart and labs.  Questions were answered to the patient's satisfaction.     Garnett Farm

## 2012-03-08 NOTE — Anesthesia Postprocedure Evaluation (Signed)
  Anesthesia Post-op Note  Patient: Paul [name]  Procedure(s) Performed: Procedure(s) (LRB): CYSTOSCOPY WITH BIOPSY (N/A) TRANSURETHRAL INCISION OF THE PROSTATE (TUIP) (N/A)  Patient Location: PACU  Anesthesia Type: General  Level of Consciousness: awake and alert   Airway and Oxygen Therapy: Patient Spontanous Breathing  Post-op Pain: mild  Post-op Assessment: Post-op Vital signs reviewed, Patient's Cardiovascular Status Stable, Respiratory Function Stable, Patent Airway and No signs of Nausea or vomiting  Post-op Vital Signs: stable  Complications: No apparent anesthesia complications

## 2012-03-08 NOTE — Transfer of Care (Signed)
Immediate Anesthesia Transfer of Care Note  Patient: Paul [name]  Procedure(s) Performed: Procedure(s) (LRB): CYSTOSCOPY WITH BIOPSY (N/A) TRANSURETHRAL INCISION OF THE PROSTATE (TUIP) (N/A)  Patient Location: Patient transported to PACU with oxygen via face mask at 4 Liters / Min  Anesthesia Type: General  Level of Consciousness: awake and alert   Airway & Oxygen Therapy: Patient Spontanous Breathing and Patient connected to face mask oxygen  Post-op Assessment: Report given to PACU RN and Post -op Vital signs reviewed and stable  Post vital signs: Reviewed and stable  Dentition: Teeth and oropharynx remain in pre-op condition  Complications: No apparent anesthesia complications

## 2012-03-08 NOTE — Anesthesia Procedure Notes (Signed)
Procedure Name: LMA Insertion Date/Time: 03/08/2012 7:45 AM Performed by: Fran Lowes Pre-anesthesia Checklist: Patient identified, Emergency Drugs available, Suction available and Patient being monitored Patient Re-evaluated:Patient Re-evaluated prior to inductionOxygen Delivery Method: Circle System Utilized Preoxygenation: Pre-oxygenation with 100% oxygen Intubation Type: IV induction Ventilation: Mask ventilation without difficulty LMA: LMA inserted LMA Size: 4.0 Number of attempts: 1 Airway Equipment and Method: bite block Placement Confirmation: positive ETCO2 Tube secured with: Tape Dental Injury: Teeth and Oropharynx as per pre-operative assessment

## 2012-03-08 NOTE — Discharge Instructions (Signed)

## 2012-03-08 NOTE — Op Note (Signed)
PATIENT:  Paul [name]  PRE-OPERATIVE DIAGNOSIS: 1. History of high-grade transitional cell carcinoma of the bladder 2. BPH with outlet obstruction  POST-OPERATIVE DIAGNOSIS: Same  PROCEDURE:  Procedure(s): 1. Cold cup biopsy of the bladder. 2. Transurethral incision of the prostate  SURGEON:  Surgeon(s): Garnett Farm  ANESTHESIA:   General  EBL:  Minimal  DRAINS: None  SPECIMEN:  Source of Specimen: Previous tumor resection site  DISPOSITION OF SPECIMEN:  PATHOLOGY  Indication: Paul [name] is a 78 year old male who was evaluated for microscopic hematuria. CT scan revealed normal upper tacks and cystoscopy I found a tumor in the bladder on the left superior wall. This was resected and revealed high-grade transitional cell carcinoma with no evidence of invasion. He received postoperative mitomycin-C. He returns today for repeat biopsy of his resection site. Because he developed urinary retention postoperatively and has underlying significant outlet obstructive symptoms we discussed proceeding with a transurethral incision of the prostate.  Description of operation: The patient was taken to the operating room and administered general anesthesia. He was then placed on the table and moved to the dorsal lithotomy position after which his genitalia was sterilely prepped and draped. An official timeout was then performed.  The 22 French cystoscope was then passed under direct vision with the 12 lens and the urethra was noted be normal. The prostatic urethra revealed some slight bilobar hypertrophy with a high median lobe/median bar. The bladder was entered and again the ureteral orifices were noted to be of normal configuration and position and well away from the bladder neck. The bladder was then fully and systematically inspected and no obvious tumors stones or bladder lesions were seen. The previous resection site on the superior wall to the left of midline was again noted. There was an  area adjacent to this that appeared to possibly be TCCA. I introduced the cold cup biopsy forceps and obtained a biopsy from the most suspicious-appearing area and then obtained deeper biopsies from the previous resection site.  The 26 French resectoscope with Timberlake obturator was then introduced into the bladder and the obturator was removed. The resectoscope element with 12 lens was then inserted and the bladder. I used the Gyrus button and first fulgurated the biopsy site. I then fulgurated the surrounding mucosa especially in the area where it appeared somewhat abnormal. No further abnormal mucosa could be identified after this was complete.  I then turned my attention to the prostatic urethra and began incising at the 6:00 position from the bladder neck back to the level of the ureter. As I made this incision the prostatic urethra opened up nicely. I incised deeply into the prostate in the midline and then fulgurated all bleeding points. I reinspected the bladder and noted to be intact with no evidence of perforation or injury. Ureteral orifices were noted to be intact. I therefore removed the resectoscope after draining the bladder and the patient was awakened and taken to the recovery room in stable and satisfactory condition. He tolerated the procedure well no intraoperative complications.  PLAN OF CARE: Discharge to home after PACU  PATIENT DISPOSITION:  PACU - hemodynamically stable.

## 2012-03-09 ENCOUNTER — Encounter (HOSPITAL_BASED_OUTPATIENT_CLINIC_OR_DEPARTMENT_OTHER): Payer: Self-pay | Admitting: Urology

## 2012-03-16 ENCOUNTER — Ambulatory Visit (HOSPITAL_COMMUNITY)
Admission: RE | Admit: 2012-03-16 | Discharge: 2012-03-16 | Disposition: A | Discharge status: Home / Self Care | Payer: Medicare Other | Source: Ambulatory Visit | Attending: Internal Medicine | Admitting: Internal Medicine

## 2012-03-16 DIAGNOSIS — R0989 Other specified symptoms and signs involving the circulatory and respiratory systems: Secondary | ICD-10-CM | POA: Insufficient documentation

## 2012-03-16 DIAGNOSIS — R0609 Other forms of dyspnea: Secondary | ICD-10-CM | POA: Insufficient documentation

## 2012-03-16 MED ORDER — ALBUTEROL SULFATE (5 MG/ML) 0.5% IN NEBU
2.5000 mg | INHALATION_SOLUTION | Freq: Once | RESPIRATORY_TRACT | Status: AC
  Administered 2012-03-16: 2.5 mg via RESPIRATORY_TRACT

## 2012-04-08 ENCOUNTER — Other Ambulatory Visit: Payer: Self-pay | Admitting: Cardiology

## 2012-04-09 ENCOUNTER — Ambulatory Visit (INDEPENDENT_AMBULATORY_CARE_PROVIDER_SITE_OTHER): Payer: Medicare Other | Admitting: Cardiology

## 2012-04-09 ENCOUNTER — Encounter: Payer: Self-pay | Admitting: Cardiology

## 2012-04-09 VITALS — BP 117/63 | HR 75 | Ht 67.0 in | Wt 154.0 lb

## 2012-04-09 DIAGNOSIS — I739 Peripheral vascular disease, unspecified: Secondary | ICD-10-CM

## 2012-04-09 DIAGNOSIS — I10 Essential (primary) hypertension: Secondary | ICD-10-CM

## 2012-04-09 DIAGNOSIS — R0609 Other forms of dyspnea: Secondary | ICD-10-CM

## 2012-04-09 DIAGNOSIS — R0600 Dyspnea, unspecified: Secondary | ICD-10-CM | POA: Insufficient documentation

## 2012-04-09 NOTE — Assessment & Plan Note (Signed)
Patient has left femoral bruit and diminished DP pulse on the left. He has claudication but only after walking three quarters of a mile. Check ABIs with Doppler. Add aspirin 81 mg daily. He has not tolerated statins previously.

## 2012-04-09 NOTE — Assessment & Plan Note (Signed)
Etiology unclear. He apparently has had pulmonary evaluation which was unremarkable. I will schedule a stress echocardiogram to quantify LV function and to exclude ischemia. There may be a component of deconditioning as he has had decreasing activities for one year. He attributes this to depression.

## 2012-04-09 NOTE — Progress Notes (Signed)
HPI: 78 year-old male for evaluation of dyspnea. Note recent laboratories showed a normal TSH. Patient states that in the past 2 years he has had problems with depression. He retired 2-1/2 years ago and lost his grandson approximately 4 years ago. He has had minimal activity in the past year. Over the past year he has noticed progressive dyspnea on exertion. There is no orthopnea, PND, pedal edema, palpitations, syncope or chest pain. He has also had some decreased appetite and weight loss. He also notes pain in his left lower extremity after ambulating approximately 3/4 of a mile. Because of his dyspnea we were asked to evaluate.  Current Outpatient Prescriptions  Medication Sig Dispense Refill  . acetaminophen (TYLENOL) 500 MG tablet Take 500 mg by mouth every 6 (six) hours as needed.      . Aspirin-Acetaminophen-Caffeine (GOODY HEADACHE PO) Take by mouth as needed. GOODY POWDER      . buPROPion (WELLBUTRIN) 100 MG tablet 150 mg daily      . finasteride (PROSCAR) 5 MG tablet Take 5 mg by mouth daily.      Marland Kitchen lisinopril (PRINIVIL,ZESTRIL) 20 MG tablet Take 20 mg by mouth daily.      . sertraline (ZOLOFT) 100 MG tablet 150 mg po qd      . Tamsulosin HCl (FLOMAX) 0.4 MG CAPS Take 0.4 mg by mouth daily.      Marland Kitchen aspirin EC 81 MG tablet Take 1 tablet (81 mg total) by mouth daily.  150 tablet  2  . DISCONTD: simvastatin (ZOCOR) 20 MG tablet Take 20 mg by mouth every evening.        No Known Allergies  Past Medical History  Diagnosis Date  . Bladder tumor   . Personal history of gastric ulcer   . Impaired hearing BILATERAL HEARING AIDS  . BPH (benign prostatic hyperplasia)   . Hypertension   . Hyperlipidemia   . Back pain     Past Surgical History  Procedure Date  . Appendectomy 1957  . Tonsillectomy CHILD  . Cataract extraction w/ intraocular lens  implant, bilateral   . Transurethral resection of bladder tumor 01/26/2012    Procedure: TRANSURETHRAL RESECTION OF BLADDER TUMOR (TURBT);   Surgeon: Garnett Farm, MD;  Location: [facility];  Service: Urology;  Laterality: N/A;  GYRUS MYTOMICIN C   . Cystoscopy with biopsy 03/08/2012    Procedure: CYSTOSCOPY WITH BIOPSY;  Surgeon: Garnett Farm, MD;  Location: [facility];  Service: Urology;  Laterality: N/A;  gyrus  . Transurethral incision of prostate 03/08/2012    Procedure: TRANSURETHRAL INCISION OF THE PROSTATE (TUIP);  Surgeon: Garnett Farm, MD;  Location: [facility];  Service: Urology;  Laterality: N/A;    History   Social History  . Marital Status: Married    Spouse Name: N/A    Number of Children: 3  . Years of Education: N/A   Occupational History  . Not on file.   Social History Main Topics  . Smoking status: Former Smoker -- 30 years    Types: Cigarettes    Quit date: 01/20/1981  . Smokeless tobacco: Never Used  . Alcohol Use: No  . Drug Use: No  . Sexually Active:    Other Topics Concern  . Not on file   Social History Narrative  . No narrative on file    No family history on file.  ROS: Recent problems with hematuria and bladder tumor removed. Also complains of weight  loss, depression and dyspnea. no fevers or chills, productive cough, hemoptysis, dysphasia, odynophagia, melena, hematochezia, dysuria,  rash, seizure activity, orthopnea, PND, pedal edema. Remaining systems are negative.  Physical Exam:   Blood pressure 117/63, pulse 75, height 5\' 7"  (1.702 m), weight 69.854 kg (154 lb).  General:  Well developed/well nourished in NAD Skin warm/dry Patient not depressed No peripheral clubbing Back-normal HEENT-normal/normal eyelids Neck supple/normal carotid upstroke bilaterally; no bruits; no JVD; no thyromegaly chest - CTA/ normal expansion CV - RRR/normal S1 and S2; no murmurs, rubs or gallops;  PMI nondisplaced Abdomen -NT/ND, no HSM, no mass, + bowel sounds, no bruit 2+ femoral pulses, left femoral bruit Ext-no edema, chords, 2+ DP on  the right and left not palpable. Neuro-grossly nonfocal  ECG 01/26/2012-sinus rhythm at a rate of 80. Left axis deviation. RV conduction delay. No ST changes.

## 2012-04-09 NOTE — Patient Instructions (Signed)
Your physician recommends that you schedule a follow-up appointment in: AS NEEDED PENDING TEST RESULTS  Your physician has requested that you have a stress echocardiogram. For further information please visit [email or web address] Please follow instruction sheet as given.   Your physician has requested that you have a lower extremity arterial duplex. During this test ultrasound are used to evaluate arterial blood flow in the legs. Allow one hour for this exam. There are no restrictions or special instructions.    START ASPIRIN 81 MG ONCE DAILY

## 2012-04-09 NOTE — Assessment & Plan Note (Signed)
Blood pressure controlled. Continue present medications. 

## 2012-04-16 ENCOUNTER — Other Ambulatory Visit: Payer: Self-pay | Admitting: Cardiology

## 2012-04-16 DIAGNOSIS — R0609 Other forms of dyspnea: Secondary | ICD-10-CM

## 2012-04-16 DIAGNOSIS — R0989 Other specified symptoms and signs involving the circulatory and respiratory systems: Secondary | ICD-10-CM

## 2012-04-16 DIAGNOSIS — R0600 Dyspnea, unspecified: Secondary | ICD-10-CM

## 2012-04-20 ENCOUNTER — Encounter (INDEPENDENT_AMBULATORY_CARE_PROVIDER_SITE_OTHER): Payer: Medicare Other

## 2012-04-20 DIAGNOSIS — I70219 Atherosclerosis of native arteries of extremities with intermittent claudication, unspecified extremity: Secondary | ICD-10-CM

## 2012-04-20 DIAGNOSIS — I739 Peripheral vascular disease, unspecified: Secondary | ICD-10-CM

## 2012-04-21 ENCOUNTER — Other Ambulatory Visit: Payer: Self-pay | Admitting: Cardiology

## 2012-04-21 DIAGNOSIS — I739 Peripheral vascular disease, unspecified: Secondary | ICD-10-CM

## 2012-04-23 ENCOUNTER — Other Ambulatory Visit (HOSPITAL_COMMUNITY): Payer: Medicare Other

## 2012-04-27 ENCOUNTER — Other Ambulatory Visit: Payer: Self-pay

## 2012-04-27 ENCOUNTER — Ambulatory Visit (HOSPITAL_COMMUNITY): Payer: Medicare Other | Attending: Cardiology

## 2012-04-27 DIAGNOSIS — R0602 Shortness of breath: Secondary | ICD-10-CM

## 2012-04-27 DIAGNOSIS — R0600 Dyspnea, unspecified: Secondary | ICD-10-CM

## 2012-04-27 DIAGNOSIS — E119 Type 2 diabetes mellitus without complications: Secondary | ICD-10-CM | POA: Insufficient documentation

## 2012-04-27 DIAGNOSIS — R0989 Other specified symptoms and signs involving the circulatory and respiratory systems: Secondary | ICD-10-CM | POA: Insufficient documentation

## 2012-04-27 DIAGNOSIS — R0609 Other forms of dyspnea: Secondary | ICD-10-CM | POA: Insufficient documentation

## 2012-05-04 ENCOUNTER — Encounter (INDEPENDENT_AMBULATORY_CARE_PROVIDER_SITE_OTHER): Payer: Medicare Other

## 2012-05-04 DIAGNOSIS — I739 Peripheral vascular disease, unspecified: Secondary | ICD-10-CM

## 2012-05-04 DIAGNOSIS — I70219 Atherosclerosis of native arteries of extremities with intermittent claudication, unspecified extremity: Secondary | ICD-10-CM

## 2012-05-05 ENCOUNTER — Encounter: Payer: Self-pay | Admitting: Physician Assistant

## 2012-05-05 ENCOUNTER — Ambulatory Visit (INDEPENDENT_AMBULATORY_CARE_PROVIDER_SITE_OTHER): Payer: Medicare Other | Admitting: Physician Assistant

## 2012-05-05 DIAGNOSIS — R0600 Dyspnea, unspecified: Secondary | ICD-10-CM

## 2012-05-05 DIAGNOSIS — R0602 Shortness of breath: Secondary | ICD-10-CM

## 2012-05-05 DIAGNOSIS — R0609 Other forms of dyspnea: Secondary | ICD-10-CM

## 2012-05-05 NOTE — Procedures (Signed)
Exercise Treadmill Test  Pre-Exercise Testing Evaluation Rhythm: normal sinus  Rate: 77   PR:  .17 QRS:  .09  QT:  .36 QTc: .41     Test  Exercise Tolerance Test Ordering MD: Olga Millers, MD  Interpreting MD: Tereso Newcomer PA-C  Unique Test No: 1  Treadmill:  1  Indication for ETT: exertional dyspnea  Contraindication to ETT: No   Stress Modality: exercise - treadmill  Cardiac Imaging Performed: non   Protocol: Naughton B/P199/70  Max MPHR (bpm):  141 85% MPR (bpm):  119  MPHR obtained (bpm):  122 % MPHR obtained:  86%  Reached 85% MPHR (min:sec):  12:00 Total Exercise Time (min-sec):  12:20  Workload in METS:  6.3 Borg Scale: 13  Reason ETT Terminated:  desired heart rate attained    ST Segment Analysis At Rest: normal ST segments - no evidence of significant ST depression With Exercise: no evidence of significant ST depression  Other Information Arrhythmia:  No Angina during ETT:  absent (0) Quality of ETT:  diagnostic  ETT Interpretation:  normal - no evidence of ischemia by ST analysis  Comments: Fair exercise tolerance. Patient with poor balance at the beginning of the test and we switched him to Naughton protocol. No chest pain. Normal BP response to exercise. No ST-T changes to suggest ischemia.   Recommendations: Follow up with Dr. Olga Millers as directed. Tereso Newcomer, PA-C  3:37 PM 05/05/2012

## 2012-05-26 ENCOUNTER — Ambulatory Visit (INDEPENDENT_AMBULATORY_CARE_PROVIDER_SITE_OTHER): Payer: Medicare Other | Admitting: Cardiovascular Disease

## 2012-05-26 ENCOUNTER — Encounter: Payer: Self-pay | Admitting: Cardiovascular Disease

## 2012-05-26 VITALS — BP 128/70 | HR 68 | Ht 67.0 in | Wt 154.4 lb

## 2012-05-26 DIAGNOSIS — I739 Peripheral vascular disease, unspecified: Secondary | ICD-10-CM | POA: Insufficient documentation

## 2012-05-26 MED ORDER — CILOSTAZOL 50 MG PO TABS: 50.0000 mg | ORAL_TABLET | Freq: Two times a day (BID) | ORAL | Status: DC

## 2012-05-26 NOTE — Assessment & Plan Note (Signed)
The patient has evidence of underlying peripheral arterial disease. He has left calf claudication which does not seem to be lifestyle limiting. However, this definitely affected his ability to exercise as he used to walk 2-3 miles everyday in the past. Currently he started having discomfort after three quarters of a mile.  He does not have evidence of critical limb ischemia. I discussed with him the natural history and management of peripheral arterial disease. He is not a smoker. He is on aspirin daily. Unfortunately, he is intolerant to statins. I discussed with him the importance of healthy diet and regular exercise. I advised him to continue his walking program which can likely improve his walking distance. We also discussed the possibility of proceeding with invasive angiography and possible percutaneous intervention. However, at this point he is having other issues which cannot be accounted for by his peripheral disease such as fatigue and weight loss. He is also undergoing treatment for bladder cancer. I will start him today on Pletal 50 mg twice daily. I will have him followup in 6 months from now. I explained to him that if his symptoms worsen he can always call me to schedule abdominal aortogram and lower extremity runoff.

## 2012-05-26 NOTE — Patient Instructions (Addendum)
Your physician wants you to follow-up in: 6 months.   You will receive a reminder letter in the mail two months in advance. If you don't receive a letter, please call our office to schedule the follow-up appointment.  Your physician has recommended you make the following change in your medication: Start Pletal 50mg  twice daily

## 2012-05-26 NOTE — Progress Notes (Signed)
HPI  This is a 78 year old male who was referred by Dr. Jens Som for evaluation and management of newly diagnosed peripheral arterial disease. He was seen recently for evaluation of fatigue and dyspnea. He underwent an echocardiogram as well as a treadmill stress test. Both of them were unremarkable. He was noted on physical exam to have femoral bruits with absent pulses in the left foot. He underwent evaluation with an ABI which was normal on the right side and mildly reduced on the left side at 0.72. He had an arterial duplex ultrasound performed which showed significant focal mid SFA stenosis.  The patient has been suffering over the last few months from symptoms of fatigue, depression and weight loss. He was diagnosed with bladder cancer in March of this year and is currently undergoing treatment with localized intra- bladder injection likely with BCG. He does have left calf claudication which started a few years ago. It has been overall stable. It happens after he walks about three quarters of a mile. He used to be more active in the past and was able to walk 2-3 miles without limitations. There is no rest pain. There is no history of nonhealing ulcers. He is not able to tolerate statins due to severe muscle cramps.  No Known Allergies   Current Outpatient Prescriptions on File Prior to Visit  Medication Sig Dispense Refill  . acetaminophen (TYLENOL) 500 MG tablet Take 500 mg by mouth every 6 (six) hours as needed.      Marland Kitchen aspirin EC 81 MG tablet Take 1 tablet (81 mg total) by mouth daily.  150 tablet  2  . Aspirin-Acetaminophen-Caffeine (GOODY HEADACHE PO) Take by mouth as needed. GOODY POWDER      . buPROPion (WELLBUTRIN) 100 MG tablet 150 mg daily      . finasteride (PROSCAR) 5 MG tablet Take 5 mg by mouth daily.      Marland Kitchen lisinopril (PRINIVIL,ZESTRIL) 20 MG tablet Take 20 mg by mouth daily.      . sertraline (ZOLOFT) 100 MG tablet 150 mg po qd      . Tamsulosin HCl (FLOMAX) 0.4 MG CAPS  Take 0.4 mg by mouth daily.      Marland Kitchen DISCONTD: simvastatin (ZOCOR) 20 MG tablet Take 20 mg by mouth every evening.         Past Medical History  Diagnosis Date  . Bladder tumor   . Personal history of gastric ulcer   . Impaired hearing BILATERAL HEARING AIDS  . BPH (benign prostatic hyperplasia)   . Hypertension   . Hyperlipidemia   . Back pain      Past Surgical History  Procedure Date  . Appendectomy 1957  . Tonsillectomy CHILD  . Cataract extraction w/ intraocular lens  implant, bilateral   . Transurethral resection of bladder tumor 01/26/2012    Procedure: TRANSURETHRAL RESECTION OF BLADDER TUMOR (TURBT);  Surgeon: Garnett Farm, MD;  Location: [facility];  Service: Urology;  Laterality: N/A;  GYRUS MYTOMICIN C   . Cystoscopy with biopsy 03/08/2012    Procedure: CYSTOSCOPY WITH BIOPSY;  Surgeon: Garnett Farm, MD;  Location: [facility];  Service: Urology;  Laterality: N/A;  gyrus  . Transurethral incision of prostate 03/08/2012    Procedure: TRANSURETHRAL INCISION OF THE PROSTATE (TUIP);  Surgeon: Garnett Farm, MD;  Location: [facility];  Service: Urology;  Laterality: N/A;     No family history on file.   History   Social  History  . Marital Status: Married    Spouse Name: N/A    Number of Children: 3  . Years of Education: N/A   Occupational History  . Not on file.   Social History Main Topics  . Smoking status: Former Smoker -- 30 years    Types: Cigarettes    Quit date: 01/20/1981  . Smokeless tobacco: Never Used  . Alcohol Use: No  . Drug Use: No  . Sexually Active:    Other Topics Concern  . Not on file   Social History Narrative  . No narrative on file     PHYSICAL EXAM   BP 128/70  Pulse 68  Ht 5\' 7"  (1.702 m)  Wt 154 lb 6.4 oz (70.035 kg)  BMI 24.18 kg/m2  Constitutional: He is oriented to person, place, and time. He appears well-developed and well-nourished. No distress.  HENT: No  nasal discharge.  Head: Normocephalic and atraumatic.  Eyes: Pupils are equal and round. Right eye exhibits no discharge. Left eye exhibits no discharge.  Neck: Normal range of motion. Neck supple. No JVD present. No thyromegaly present.  Cardiovascular: Normal rate, regular rhythm, normal heart sounds and. Exam reveals no gallop and no friction rub. No murmur heard.  Pulmonary/Chest: Effort normal and breath sounds normal. No stridor. No respiratory distress. He has no wheezes. He has no rales. He exhibits no tenderness.  Abdominal: Soft. Bowel sounds are normal. He exhibits no distension. There is no tenderness. There is no rebound and no guarding.  Musculoskeletal: Normal range of motion. He exhibits no edema and no tenderness.  Neurological: He is alert and oriented to person, place, and time. Coordination normal.  Skin: Skin is warm and dry. No rash noted. He is not diaphoretic. No erythema. No pallor.  Psychiatric: He has a normal mood and affect. His behavior is normal. Judgment and thought content normal.  Vascular: Femoral pulses are normal bilaterally but there are bilateral bruits louder on the left side. PT/DP normal and the right side and very faint on the left side.      ASSESSMENT AND PLAN

## 2012-09-15 ENCOUNTER — Other Ambulatory Visit: Payer: Self-pay | Admitting: Dermatology

## 2012-11-01 ENCOUNTER — Other Ambulatory Visit: Payer: Self-pay | Admitting: Dermatology

## 2012-11-24 ENCOUNTER — Ambulatory Visit (INDEPENDENT_AMBULATORY_CARE_PROVIDER_SITE_OTHER): Payer: Medicare Other | Admitting: Cardiovascular Disease

## 2012-11-24 ENCOUNTER — Encounter: Payer: Self-pay | Admitting: Cardiovascular Disease

## 2012-11-24 VITALS — BP 168/82 | HR 83 | Ht 67.0 in | Wt 159.8 lb

## 2012-11-24 DIAGNOSIS — I10 Essential (primary) hypertension: Secondary | ICD-10-CM

## 2012-11-24 DIAGNOSIS — I739 Peripheral vascular disease, unspecified: Secondary | ICD-10-CM

## 2012-11-24 MED ORDER — CILOSTAZOL 50 MG PO TABS: 50.0000 mg | ORAL_TABLET | Freq: Two times a day (BID) | ORAL | Status: AC

## 2012-11-24 NOTE — Patient Instructions (Addendum)
Your physician wants you to follow-up in: 6 months  You will receive a reminder letter in the mail two months in advance. If you don't receive a letter, please call our office to schedule the follow-up appointment.  Your physician recommends that you continue on your current medications as directed. Please refer to the Current Medication list given to you today.  

## 2012-11-25 ENCOUNTER — Encounter: Payer: Self-pay | Admitting: Cardiovascular Disease

## 2012-11-25 NOTE — Assessment & Plan Note (Signed)
Patient had mild claudication with mildly reduced ABI. He responded very well to treatment with Pletal with no physical limitations at this time. Thus, I recommend continuing current management. No indication to proceed with angiography. I will have him followup with me in 6 months. I will likely repeat his ABI in a year from now to ensure stability.

## 2012-11-25 NOTE — Progress Notes (Signed)
HPI  This is an 78 year old male who is here today for a followup visit regarding mild claudication and peripheral arterial disease. He was seen by Dr. Jens Som early this year for evaluation of fatigue and dyspnea. He underwent an echocardiogram as well as a treadmill stress test. Both of them were unremarkable. He was noted on physical exam to have femoral bruits with absent pulses in the left foot. He underwent evaluation with an ABI which was normal on the right side and mildly reduced on the left side at 0.72. He had an arterial duplex ultrasound performed which showed significant focal mid SFA stenosis.   He was diagnosed with bladder cancer in March of this year and underwent treatment with localized intra- bladder injection . He reported left calf claudication after  three quarters of a mile. Due to mild symptoms, I elected to start him on Pletal and encouraged regular walking program. Within 2 weeks of starting Pletal, he reports complete resolution of claudication. He is walking almost daily more than a mile without any symptoms.  No Known Allergies   Current Outpatient Prescriptions on File Prior to Visit  Medication Sig Dispense Refill  . acetaminophen (TYLENOL) 500 MG tablet Take 500 mg by mouth every 6 (six) hours as needed.      Marland Kitchen aspirin EC 81 MG tablet Take 1 tablet (81 mg total) by mouth daily.  150 tablet  2  . Aspirin-Acetaminophen-Caffeine (GOODY HEADACHE PO) Take by mouth as needed. GOODY POWDER      . Tamsulosin HCl (FLOMAX) 0.4 MG CAPS Take 0.4 mg by mouth daily.      . [DISCONTINUED] simvastatin (ZOCOR) 20 MG tablet Take 20 mg by mouth every evening.         Past Medical History  Diagnosis Date  . Bladder tumor   . Personal history of gastric ulcer   . Impaired hearing BILATERAL HEARING AIDS  . BPH (benign prostatic hyperplasia)   . Hypertension   . Hyperlipidemia   . Back pain      Past Surgical History  Procedure Date  . Appendectomy 1957  .  Tonsillectomy CHILD  . Cataract extraction w/ intraocular lens  implant, bilateral   . Transurethral resection of bladder tumor 01/26/2012    Procedure: TRANSURETHRAL RESECTION OF BLADDER TUMOR (TURBT);  Surgeon: Garnett Farm, MD;  Location: [facility];  Service: Urology;  Laterality: N/A;  GYRUS MYTOMICIN C   . Cystoscopy with biopsy 03/08/2012    Procedure: CYSTOSCOPY WITH BIOPSY;  Surgeon: Garnett Farm, MD;  Location: [facility];  Service: Urology;  Laterality: N/A;  gyrus  . Transurethral incision of prostate 03/08/2012    Procedure: TRANSURETHRAL INCISION OF THE PROSTATE (TUIP);  Surgeon: Garnett Farm, MD;  Location: [facility];  Service: Urology;  Laterality: N/A;     No family history on file.   History   Social History  . Marital Status: Married    Spouse Name: N/A    Number of Children: 3  . Years of Education: N/A   Occupational History  . Not on file.   Social History Main Topics  . Smoking status: Former Smoker -- 30 years    Types: Cigarettes    Quit date: 01/20/1981  . Smokeless tobacco: Never Used  . Alcohol Use: No  . Drug Use: No  . Sexually Active:    Other Topics Concern  . Not on file   Social History Narrative  .  No narrative on file     PHYSICAL EXAM   BP 168/82  Pulse 83  Ht 5\' 7"  (1.702 m)  Wt 159 lb 12.8 oz (72.485 kg)  BMI 25.03 kg/m2  SpO2 96%  Constitutional: He is oriented to person, place, and time. He appears well-developed and well-nourished. No distress.  HENT: No nasal discharge.  Head: Normocephalic and atraumatic.  Eyes: Pupils are equal and round. Right eye exhibits no discharge. Left eye exhibits no discharge.  Neck: Normal range of motion. Neck supple. No JVD present. No thyromegaly present.  Cardiovascular: Normal rate, regular rhythm, normal heart sounds and. Exam reveals no gallop and no friction rub. No murmur heard.  Pulmonary/Chest: Effort normal and breath  sounds normal. No stridor. No respiratory distress. He has no wheezes. He has no rales. He exhibits no tenderness.  Abdominal: Soft. Bowel sounds are normal. He exhibits no distension. There is no tenderness. There is no rebound and no guarding.  Musculoskeletal: Normal range of motion. He exhibits no edema and no tenderness.  Neurological: He is alert and oriented to person, place, and time. Coordination normal.  Skin: Skin is warm and dry. No rash noted. He is not diaphoretic. No erythema. No pallor.  Psychiatric: He has a normal mood and affect. His behavior is normal. Judgment and thought content normal.  Vascular: Femoral pulses are normal bilaterally but there are bilateral bruits louder on the left side. PT/DP normal and the right side and very faint on the left side.      ASSESSMENT AND PLAN

## 2012-11-25 NOTE — Assessment & Plan Note (Signed)
The patient was taken off lisinopril recently due to low blood pressure. His blood pressure is mildly elevated today. Continue to monitor for now.

## 2012-12-14 ENCOUNTER — Other Ambulatory Visit: Payer: Self-pay | Admitting: Dermatology

## 2013-03-17 ENCOUNTER — Telehealth: Payer: Self-pay | Admitting: Cardiovascular Disease

## 2013-03-17 NOTE — Telephone Encounter (Signed)
Pt was notified.  Ok was faxed to Dr Morrison Old at the listed number.

## 2013-03-17 NOTE — Telephone Encounter (Signed)
He can come off Pletal as needed.

## 2013-03-17 NOTE — Telephone Encounter (Signed)
New Prob   Pt is having some dental work done. Dentist requesting pt to come off PLETAL until further notice. Requesting fax to be sent to release pt off this medication. Would like to speak to nurse.  Fax Number: [phone]

## 2013-03-17 NOTE — Telephone Encounter (Signed)
Paul [name] is having all his teeth pulled for dentures.  He needs to be off his Pletal until he gets his dentures.  It will probably be at least a month.  Paul Rodeheaver states his bruising is worse since starting the Pletal.

## 2013-05-24 ENCOUNTER — Ambulatory Visit (INDEPENDENT_AMBULATORY_CARE_PROVIDER_SITE_OTHER): Payer: Medicare Other | Admitting: Cardiovascular Disease

## 2013-05-24 ENCOUNTER — Encounter: Payer: Self-pay | Admitting: Cardiovascular Disease

## 2013-05-24 VITALS — BP 130/60 | HR 79 | Ht 67.0 in | Wt 154.8 lb

## 2013-05-24 DIAGNOSIS — I739 Peripheral vascular disease, unspecified: Secondary | ICD-10-CM

## 2013-05-24 NOTE — Assessment & Plan Note (Signed)
Patient had mild claudication with mildly reduced ABI. He responded very well to treatment with Pletal with no physical limitations at this time. He is able to walk 2 miles without symptoms. Thus, I recommend continuing current management. No indication for angiography.  Follow up in 1 year or earlier if needed.  He reports no hyperlipidemia. I recommend considering a statin if LDL >100 given given presence if PAD.  He is not a smoker and does not have diabetes. Thus, risk of progression is overall low.

## 2013-05-24 NOTE — Patient Instructions (Addendum)
Your physician wants you to follow-up in: 1 YEAR.  You will receive a reminder letter in the mail two months in advance. If you don't receive a letter, please call our office to schedule the follow-up appointment.  Your physician recommends that you continue on your current medications as directed. Please refer to the Current Medication list given to you today.  

## 2013-05-24 NOTE — Progress Notes (Signed)
HPI  This is an 79 year old male who is here today for a followup visit regarding mild claudication and peripheral arterial disease. He was seen last year by Dr. Jens Som for evaluation of fatigue and dyspnea. He underwent an echocardiogram as well as a treadmill stress test. Both of them were unremarkable. He was noted on physical exam to have femoral bruits with absent pulses in the left foot. He underwent evaluation with an ABI which was normal on the right side and mildly reduced on the left side at 0.72. He had an arterial duplex ultrasound performed which showed significant focal mid SFA stenosis.   He was diagnosed with bladder cancer in March of 2013 and underwent treatment with localized intra- bladder injection . He reported left calf claudication after  three quarters of a mile. Due to mild symptoms, I elected to start him on Pletal and encouraged regular walking program. Within 2 weeks of starting Pletal, he had complete resolution of claudication.  He is doing well with no recurrent symptoms. He is able to walk 2 miles without claudication.   No Known Allergies   Current Outpatient Prescriptions on File Prior to Visit  Medication Sig Dispense Refill  . acetaminophen (TYLENOL) 500 MG tablet Take 500 mg by mouth every 6 (six) hours as needed.      . Aspirin-Acetaminophen-Caffeine (GOODY HEADACHE PO) Take by mouth as needed. GOODY POWDER      . cilostazol (PLETAL) 50 MG tablet Take 1 tablet (50 mg total) by mouth 2 (two) times daily.  60 tablet  6  . Tamsulosin HCl (FLOMAX) 0.4 MG CAPS Take 0.4 mg by mouth daily.      . [DISCONTINUED] simvastatin (ZOCOR) 20 MG tablet Take 20 mg by mouth every evening.       No current facility-administered medications on file prior to visit.     Past Medical History  Diagnosis Date  . Bladder tumor   . Personal history of gastric ulcer   . Impaired hearing BILATERAL HEARING AIDS  . BPH (benign prostatic hyperplasia)   . Hypertension   .  Hyperlipidemia   . Back pain      Past Surgical History  Procedure Laterality Date  . Appendectomy  1957  . Tonsillectomy  CHILD  . Cataract extraction w/ intraocular lens  implant, bilateral    . Transurethral resection of bladder tumor  01/26/2012    Procedure: TRANSURETHRAL RESECTION OF BLADDER TUMOR (TURBT);  Surgeon: Garnett Farm, MD;  Location: [facility];  Service: Urology;  Laterality: N/A;  GYRUS MYTOMICIN C   . Cystoscopy with biopsy  03/08/2012    Procedure: CYSTOSCOPY WITH BIOPSY;  Surgeon: Garnett Farm, MD;  Location: [facility];  Service: Urology;  Laterality: N/A;  gyrus  . Transurethral incision of prostate  03/08/2012    Procedure: TRANSURETHRAL INCISION OF THE PROSTATE (TUIP);  Surgeon: Garnett Farm, MD;  Location: [facility];  Service: Urology;  Laterality: N/A;     No family history on file.   History   Social History  . Marital Status: Married    Spouse Name: N/A    Number of Children: 3  . Years of Education: N/A   Occupational History  . Not on file.   Social History Main Topics  . Smoking status: Former Smoker -- 30 years    Types: Cigarettes    Quit date: 01/20/1981  . Smokeless tobacco: Never Used  . Alcohol Use: No  .  Drug Use: No  . Sexually Active:    Other Topics Concern  . Not on file   Social History Narrative  . No narrative on file     PHYSICAL EXAM   BP 130/60  Pulse 79  Ht 5\' 7"  (1.702 m)  Wt 154 lb 12.8 oz (70.217 kg)  BMI 24.24 kg/m2  SpO2 98%  Constitutional: He is oriented to person, place, and time. He appears well-developed and well-nourished. No distress.  HENT: No nasal discharge.  Head: Normocephalic and atraumatic.  Eyes: Pupils are equal and round. Right eye exhibits no discharge. Left eye exhibits no discharge.  Neck: Normal range of motion. Neck supple. No JVD present. No thyromegaly present.  Cardiovascular: Normal rate, regular rhythm, normal heart  sounds and. Exam reveals no gallop and no friction rub. No murmur heard.  Pulmonary/Chest: Effort normal and breath sounds normal. No stridor. No respiratory distress. He has no wheezes. He has no rales. He exhibits no tenderness.  Abdominal: Soft. Bowel sounds are normal. He exhibits no distension. There is no tenderness. There is no rebound and no guarding.  Musculoskeletal: Normal range of motion. He exhibits no edema and no tenderness.  Neurological: He is alert and oriented to person, place, and time. Coordination normal.  Skin: Skin is warm and dry. No rash noted. He is not diaphoretic. No erythema. No pallor.  Psychiatric: He has a normal mood and affect. His behavior is normal. Judgment and thought content normal.  Vascular: Femoral pulses are normal bilaterally but there are bilateral bruits louder on the left side. PT/DP normal and the right side and very faint on the left side.      ASSESSMENT AND PLAN

## 2013-06-07 ENCOUNTER — Ambulatory Visit: Payer: Medicare Other | Admitting: Cardiovascular Disease

## 2014-02-28 ENCOUNTER — Other Ambulatory Visit: Payer: Self-pay

## 2014-02-28 MED ORDER — CILOSTAZOL 50 MG PO TABS: 50.0000 mg | ORAL_TABLET | Freq: Two times a day (BID) | ORAL | Status: DC

## 2014-05-30 ENCOUNTER — Encounter: Payer: Self-pay | Admitting: Cardiovascular Disease

## 2014-05-30 ENCOUNTER — Ambulatory Visit (INDEPENDENT_AMBULATORY_CARE_PROVIDER_SITE_OTHER): Payer: Medicare HMO | Admitting: Cardiovascular Disease

## 2014-05-30 VITALS — BP 120/60 | HR 73 | Ht 67.0 in | Wt 152.8 lb

## 2014-05-30 DIAGNOSIS — I10 Essential (primary) hypertension: Secondary | ICD-10-CM

## 2014-05-30 DIAGNOSIS — I739 Peripheral vascular disease, unspecified: Secondary | ICD-10-CM

## 2014-05-30 NOTE — Assessment & Plan Note (Signed)
He is doing well overall with no reported claudication. Continue small dose Pletal.

## 2014-05-30 NOTE — Progress Notes (Signed)
HPI  This is an 79 year old male who is here today for a followup visit regarding mild claudication and peripheral arterial disease.Previous echocardiogram and treadmill stress test were unremarkable. He was noted on physical exam to have femoral bruits with absent pulses in the left foot. He underwent evaluation with an ABI which was normal on the right side and mildly reduced on the left side at 0.72. He had an arterial duplex ultrasound performed which showed significant focal left mid SFA stenosis.   He was diagnosed with bladder cancer in March of 2013 and underwent treatment with localized intra- bladder injection . He reported left calf claudication after  three quarters of a mile. Due to mild symptoms, I elected to start him on Pletal and encouraged regular walking program. He denies claudication. No chest pain or dyspnea. He just feels not as energetic as before.   No Known Allergies   Current Outpatient Prescriptions on File Prior to Visit  Medication Sig Dispense Refill  . acetaminophen (TYLENOL) 500 MG tablet Take 500 mg by mouth every 6 (six) hours as needed.      . Aspirin-Acetaminophen (GOODYS BODY PAIN PO) Take by mouth. Pt stated he takes [employer] as needed      . Aspirin-Acetaminophen-Caffeine (GOODY HEADACHE PO) Take by mouth as needed. GOODY POWDER      . cilostazol (PLETAL) 50 MG tablet Take 1 tablet (50 mg total) by mouth 2 (two) times daily.  60 tablet  3  . lisinopril (PRINIVIL,ZESTRIL) 20 MG tablet Take 20 mg by mouth daily.       . Tamsulosin HCl (FLOMAX) 0.4 MG CAPS Take 0.4 mg by mouth daily.      . [DISCONTINUED] simvastatin (ZOCOR) 20 MG tablet Take 20 mg by mouth every evening.       No current facility-administered medications on file prior to visit.     Past Medical History  Diagnosis Date  . Bladder tumor   . Personal history of gastric ulcer   . Impaired hearing BILATERAL HEARING AIDS  . BPH (benign prostatic hyperplasia)   . Hypertension     . Hyperlipidemia   . Back pain      Past Surgical History  Procedure Laterality Date  . Appendectomy  1957  . Tonsillectomy  CHILD  . Cataract extraction w/ intraocular lens  implant, bilateral    . Transurethral resection of bladder tumor  01/26/2012    Procedure: TRANSURETHRAL RESECTION OF BLADDER TUMOR (TURBT);  Surgeon: Claybon Jabs, MD;  Location: [facility];  Service: Urology;  Laterality: N/A;  GYRUS MYTOMICIN C   . Cystoscopy with biopsy  03/08/2012    Procedure: CYSTOSCOPY WITH BIOPSY;  Surgeon: Claybon Jabs, MD;  Location: [facility];  Service: Urology;  Laterality: N/A;  gyrus  . Transurethral incision of prostate  03/08/2012    Procedure: TRANSURETHRAL INCISION OF THE PROSTATE (TUIP);  Surgeon: Claybon Jabs, MD;  Location: [facility];  Service: Urology;  Laterality: N/A;     No family history on file.   History   Social History  . Marital Status: Married    Spouse Name: N/A    Number of Children: 3  . Years of Education: N/A   Occupational History  . Not on file.   Social History Main Topics  . Smoking status: Former Smoker -- 30 years    Types: Cigarettes    Quit date: 01/20/1981  . Smokeless tobacco: Never Used  .  Alcohol Use: No  . Drug Use: No  . Sexual Activity:    Other Topics Concern  . Not on file   Social History Narrative  . No narrative on file     PHYSICAL EXAM   BP 120/60  Pulse 73  Ht 5\' 7"  (1.702 m)  Wt 152 lb 12.8 oz (69.31 kg)  BMI 23.93 kg/m2  Constitutional: He is oriented to person, place, and time. He appears well-developed and well-nourished. No distress.  HENT: No nasal discharge.  Head: Normocephalic and atraumatic.  Eyes: Pupils are equal and round. Right eye exhibits no discharge. Left eye exhibits no discharge.  Neck: Normal range of motion. Neck supple. No JVD present. No thyromegaly present.  Cardiovascular: Normal rate, regular rhythm, normal heart sounds  and. Exam reveals no gallop and no friction rub. No murmur heard.  Pulmonary/Chest: Effort normal and breath sounds normal. No stridor. No respiratory distress. He has no wheezes. He has no rales. He exhibits no tenderness.  Abdominal: Soft. Bowel sounds are normal. He exhibits no distension. There is no tenderness. There is no rebound and no guarding.  Musculoskeletal: Normal range of motion. He exhibits no edema and no tenderness.  Neurological: He is alert and oriented to person, place, and time. Coordination normal.  Skin: Skin is warm and dry. No rash noted. He is not diaphoretic. No erythema. No pallor.  Psychiatric: He has a normal mood and affect. His behavior is normal. Judgment and thought content normal.  Vascular: Femoral pulses are normal bilaterally but there are bilateral bruits louder on the left side. PT/DP normal and the right side and very faint on the left side.   EKG: NSR   ASSESSMENT AND PLAN

## 2014-05-30 NOTE — Assessment & Plan Note (Signed)
BP is well controlled 

## 2014-05-30 NOTE — Patient Instructions (Signed)
Your physician wants you to follow-up in: 1 YEAR with Dr Fletcher Anon.  You will receive a reminder letter in the mail two months in advance. If you don't receive a letter, please call our office to schedule the follow-up appointment.  Your physician recommends that you continue on your current medications as directed. Please refer to the Current Medication list given to you today.

## 2014-08-15 ENCOUNTER — Other Ambulatory Visit: Payer: Self-pay | Admitting: *Deleted

## 2014-08-15 MED ORDER — CILOSTAZOL 50 MG PO TABS: 50.0000 mg | ORAL_TABLET | Freq: Two times a day (BID) | ORAL | Status: DC

## 2015-05-08 ENCOUNTER — Other Ambulatory Visit: Payer: Self-pay | Admitting: *Deleted

## 2015-05-08 MED ORDER — CILOSTAZOL 50 MG PO TABS: 50.0000 mg | ORAL_TABLET | Freq: Two times a day (BID) | ORAL | Status: DC

## 2015-07-20 ENCOUNTER — Other Ambulatory Visit: Payer: Self-pay | Admitting: Cardiovascular Disease

## 2015-07-20 NOTE — Telephone Encounter (Signed)
Please review for refill. Thanks!  

## 2015-08-28 ENCOUNTER — Encounter: Payer: Self-pay | Admitting: Cardiovascular Disease

## 2015-08-28 ENCOUNTER — Ambulatory Visit (INDEPENDENT_AMBULATORY_CARE_PROVIDER_SITE_OTHER): Payer: PPO | Admitting: Cardiovascular Disease

## 2015-08-28 VITALS — BP 120/64 | HR 68 | Ht 67.0 in | Wt 151.6 lb

## 2015-08-28 DIAGNOSIS — R0609 Other forms of dyspnea: Secondary | ICD-10-CM

## 2015-08-28 DIAGNOSIS — R0600 Dyspnea, unspecified: Secondary | ICD-10-CM

## 2015-08-28 DIAGNOSIS — I10 Essential (primary) hypertension: Secondary | ICD-10-CM

## 2015-08-28 DIAGNOSIS — F329 Major depressive disorder, single episode, unspecified: Secondary | ICD-10-CM

## 2015-08-28 DIAGNOSIS — R0602 Shortness of breath: Secondary | ICD-10-CM

## 2015-08-28 DIAGNOSIS — I739 Peripheral vascular disease, unspecified: Secondary | ICD-10-CM

## 2015-08-28 DIAGNOSIS — F32A Depression, unspecified: Secondary | ICD-10-CM

## 2015-08-28 NOTE — Assessment & Plan Note (Signed)
The patient has known left SFA disease but currently is asymptomatic likely due to being an active. Continue medical therapy.

## 2015-08-28 NOTE — Assessment & Plan Note (Signed)
I requested a pharmacologic nuclear stress test to evaluate this. The patient is not able to exercise on a treadmill.

## 2015-08-28 NOTE — Progress Notes (Signed)
HPI  This is an 81 year old male who is here today for a followup visit regarding mild claudication and peripheral arterial disease. Previous echocardiogram and treadmill stress test were unremarkable in 2013. He was noted on physical exam to have femoral bruits with absent pulses in the left foot. He underwent evaluation with an ABI which was normal on the right side and mildly reduced on the left side at 0.72. He had an arterial duplex ultrasound performed which showed significant focal left mid SFA stenosis.   He was diagnosed with bladder cancer in March of 2013 and underwent treatment with localized intra- bladder injection . He is not aware of left calf claudication but he admits to not being very active. He complains of exertional dyspnea and getting tired and fatigued easily with no chest discomfort. He reports low energy with negative workup done with his primary care physician. He has arthritis affecting his right knee. The patient reports lack of interest in doing things. His wife thinks that he is depressed.  Allergies  Allergen Reactions  . Livalo [Pitavastatin] Other (See Comments)    Cramping and back pain     Current Outpatient Prescriptions on File Prior to Visit  Medication Sig Dispense Refill  . Aspirin-Acetaminophen (GOODYS BODY PAIN PO) Take by mouth. Pt stated he takes [employer] as needed    . lisinopril (PRINIVIL,ZESTRIL) 20 MG tablet Take 20 mg by mouth daily.     . Tamsulosin HCl (FLOMAX) 0.4 MG CAPS Take 0.4 mg by mouth daily.    . [DISCONTINUED] simvastatin (ZOCOR) 20 MG tablet Take 20 mg by mouth every evening.     No current facility-administered medications on file prior to visit.     Past Medical History  Diagnosis Date  . Bladder tumor   . Personal history of gastric ulcer   . Impaired hearing BILATERAL HEARING AIDS  . BPH (benign prostatic hyperplasia)   . Hypertension   . Hyperlipidemia   . Back pain      Past Surgical History    Procedure Laterality Date  . Appendectomy  1957  . Tonsillectomy  CHILD  . Cataract extraction w/ intraocular lens  implant, bilateral    . Transurethral resection of bladder tumor  01/26/2012    Procedure: TRANSURETHRAL RESECTION OF BLADDER TUMOR (TURBT);  Surgeon: Claybon Jabs, MD;  Location: [facility];  Service: Urology;  Laterality: N/A;  GYRUS MYTOMICIN C   . Cystoscopy with biopsy  03/08/2012    Procedure: CYSTOSCOPY WITH BIOPSY;  Surgeon: Claybon Jabs, MD;  Location: [facility];  Service: Urology;  Laterality: N/A;  gyrus  . Transurethral incision of prostate  03/08/2012    Procedure: TRANSURETHRAL INCISION OF THE PROSTATE (TUIP);  Surgeon: Claybon Jabs, MD;  Location: [facility];  Service: Urology;  Laterality: N/A;     Family History  Problem Relation Age of Onset  . Kidney cancer Mother   . Hypertension Father      Social History   Social History  . Marital Status: Married    Spouse Name: N/A  . Number of Children: 3  . Years of Education: N/A   Occupational History  . Not on file.   Social History Main Topics  . Smoking status: Former Smoker -- 30 years    Types: Cigarettes    Quit date: 01/20/1981  . Smokeless tobacco: Never Used  . Alcohol Use: No  . Drug Use: No  . Sexual  Activity: Not on file   Other Topics Concern  . Not on file   Social History Narrative     PHYSICAL EXAM   BP 120/64 mmHg  Pulse 68  Ht 5\' 7"  (1.702 m)  Wt 151 lb 9.6 oz (68.765 kg)  BMI 23.74 kg/m2  Constitutional: He is oriented to person, place, and time. He appears well-developed and well-nourished. No distress.  HENT: No nasal discharge.  Head: Normocephalic and atraumatic.  Eyes: Pupils are equal and round. Right eye exhibits no discharge. Left eye exhibits no discharge.  Neck: Normal range of motion. Neck supple. No JVD present. No thyromegaly present.  Cardiovascular: Normal rate, regular rhythm, normal heart  sounds and. Exam reveals no gallop and no friction rub. No murmur heard.  Pulmonary/Chest: Effort normal and breath sounds normal. No stridor. No respiratory distress. He has no wheezes. He has no rales. He exhibits no tenderness.  Abdominal: Soft. Bowel sounds are normal. He exhibits no distension. There is no tenderness. There is no rebound and no guarding.  Musculoskeletal: Normal range of motion. He exhibits no edema and no tenderness.  Neurological: He is alert and oriented to person, place, and time. Coordination normal.  Skin: Skin is warm and dry. No rash noted. He is not diaphoretic. No erythema. No pallor.  Psychiatric: He has a normal mood and affect. His behavior is normal. Judgment and thought content normal.  Vascular: Femoral pulses are normal bilaterally but there are bilateral bruits louder on the left side. PT/DP normal and the right side and very faint on the left side.   EKG: NSR   ASSESSMENT AND PLAN

## 2015-08-28 NOTE — Assessment & Plan Note (Signed)
The patient reports lack of interest and motivation. His wife definitely thinks that he is depressed. He had gradual weight loss over the last few years.  I think he might benefit from treatment with an SSRI and I asked him to address that with his primary care physician.

## 2015-08-28 NOTE — Patient Instructions (Signed)
Medication Instructions:  Your physician recommends that you continue on your current medications as directed. Please refer to the Current Medication list given to you today.  Labwork: No new orders.   Testing/Procedures: Your physician has requested that you have a lexiscan myoview. For further information please visit [email or web address]. Please follow instruction sheet, as given.  Follow-Up: Your physician wants you to follow-up in: 1 YEAR with Dr Fletcher Anon.  You will receive a reminder letter in the mail two months in advance. If you don't receive a letter, please call our office to schedule the follow-up appointment.   Any Other Special Instructions Will Be Listed Below (If Applicable).

## 2015-08-28 NOTE — Assessment & Plan Note (Signed)
Blood pressure is controlled on current medications. 

## 2015-08-30 ENCOUNTER — Telehealth (HOSPITAL_COMMUNITY): Payer: Self-pay

## 2015-08-30 NOTE — Telephone Encounter (Signed)
Patient given detailed instructions per Myocardial Perfusion Study Information Sheet for test on 09-04-2015 at 0700. Patient notified to arrive 15 minutes early and that it is imperative to arrive on time for appointment to keep from having the test rescheduled.  If you need to cancel or reschedule your appointment, please call the office within 24 hours of your appointment. Failure to do so may result in a cancellation of your appointment, and a $50 no show fee. Patient verbalized understanding. Oletta Lamas, Shoua Ressler A

## 2015-09-04 ENCOUNTER — Ambulatory Visit (HOSPITAL_COMMUNITY): Payer: PPO | Attending: Cardiology

## 2015-09-04 DIAGNOSIS — R5383 Other fatigue: Secondary | ICD-10-CM | POA: Insufficient documentation

## 2015-09-04 DIAGNOSIS — I10 Essential (primary) hypertension: Secondary | ICD-10-CM | POA: Insufficient documentation

## 2015-09-04 DIAGNOSIS — R0609 Other forms of dyspnea: Secondary | ICD-10-CM | POA: Insufficient documentation

## 2015-09-04 DIAGNOSIS — R0602 Shortness of breath: Secondary | ICD-10-CM | POA: Insufficient documentation

## 2015-09-04 DIAGNOSIS — R9439 Abnormal result of other cardiovascular function study: Secondary | ICD-10-CM | POA: Diagnosis not present

## 2015-09-04 LAB — MYOCARDIAL PERFUSION IMAGING
CHL CUP NUCLEAR SDS: 4
CHL CUP NUCLEAR SRS: 2
CHL CUP NUCLEAR SSS: 5
LHR: 0.29
LV sys vol: 31 mL
LVDIAVOL: 87 mL
NUC STRESS TID: 1.02
Peak HR: 93 {beats}/min
Rest HR: 68 {beats}/min

## 2015-09-04 MED ORDER — TECHNETIUM TC 99M SESTAMIBI GENERIC - CARDIOLITE
32.2000 | Freq: Once | INTRAVENOUS | Status: AC | PRN
  Administered 2015-09-04: 32.2 via INTRAVENOUS

## 2015-09-04 MED ORDER — TECHNETIUM TC 99M SESTAMIBI GENERIC - CARDIOLITE
10.1000 | Freq: Once | INTRAVENOUS | Status: AC | PRN
  Administered 2015-09-04: 10.1 via INTRAVENOUS

## 2015-09-04 MED ORDER — REGADENOSON 0.4 MG/5ML IV SOLN
0.4000 mg | Freq: Once | INTRAVENOUS | Status: AC
  Administered 2015-09-04: 0.4 mg via INTRAVENOUS

## 2016-01-01 DIAGNOSIS — N138 Other obstructive and reflux uropathy: Secondary | ICD-10-CM | POA: Diagnosis not present

## 2016-01-01 DIAGNOSIS — N401 Enlarged prostate with lower urinary tract symptoms: Secondary | ICD-10-CM | POA: Diagnosis not present

## 2016-01-01 DIAGNOSIS — Z8551 Personal history of malignant neoplasm of bladder: Secondary | ICD-10-CM | POA: Diagnosis not present

## 2016-01-01 DIAGNOSIS — Z0000 Encounter for general adult medical examination without abnormal findings: Secondary | ICD-10-CM | POA: Diagnosis not present

## 2016-01-01 DIAGNOSIS — N99111 Postprocedural bulbous urethral stricture: Secondary | ICD-10-CM | POA: Diagnosis not present

## 2016-01-18 ENCOUNTER — Other Ambulatory Visit: Payer: Self-pay | Admitting: Cardiovascular Disease

## 2016-02-20 ENCOUNTER — Encounter: Payer: Self-pay | Admitting: Internal Medicine

## 2016-04-15 DIAGNOSIS — H04123 Dry eye syndrome of bilateral lacrimal glands: Secondary | ICD-10-CM | POA: Diagnosis not present

## 2016-04-15 DIAGNOSIS — H11153 Pinguecula, bilateral: Secondary | ICD-10-CM | POA: Diagnosis not present

## 2016-04-15 DIAGNOSIS — H18413 Arcus senilis, bilateral: Secondary | ICD-10-CM | POA: Diagnosis not present

## 2016-04-15 DIAGNOSIS — Z961 Presence of intraocular lens: Secondary | ICD-10-CM | POA: Diagnosis not present

## 2016-04-15 DIAGNOSIS — D2312 Other benign neoplasm of skin of left eyelid, including canthus: Secondary | ICD-10-CM | POA: Diagnosis not present

## 2016-05-09 DIAGNOSIS — I10 Essential (primary) hypertension: Secondary | ICD-10-CM | POA: Diagnosis not present

## 2016-05-09 DIAGNOSIS — E784 Other hyperlipidemia: Secondary | ICD-10-CM | POA: Diagnosis not present

## 2016-05-09 DIAGNOSIS — Z125 Encounter for screening for malignant neoplasm of prostate: Secondary | ICD-10-CM | POA: Diagnosis not present

## 2016-05-09 DIAGNOSIS — E538 Deficiency of other specified B group vitamins: Secondary | ICD-10-CM | POA: Diagnosis not present

## 2016-05-09 DIAGNOSIS — R8299 Other abnormal findings in urine: Secondary | ICD-10-CM | POA: Diagnosis not present

## 2016-05-09 DIAGNOSIS — N390 Urinary tract infection, site not specified: Secondary | ICD-10-CM | POA: Diagnosis not present

## 2016-05-09 DIAGNOSIS — R7301 Impaired fasting glucose: Secondary | ICD-10-CM | POA: Diagnosis not present

## 2016-05-16 DIAGNOSIS — D126 Benign neoplasm of colon, unspecified: Secondary | ICD-10-CM | POA: Diagnosis not present

## 2016-05-16 DIAGNOSIS — R413 Other amnesia: Secondary | ICD-10-CM | POA: Diagnosis not present

## 2016-05-16 DIAGNOSIS — I7389 Other specified peripheral vascular diseases: Secondary | ICD-10-CM | POA: Diagnosis not present

## 2016-05-16 DIAGNOSIS — E538 Deficiency of other specified B group vitamins: Secondary | ICD-10-CM | POA: Diagnosis not present

## 2016-05-16 DIAGNOSIS — R252 Cramp and spasm: Secondary | ICD-10-CM | POA: Diagnosis not present

## 2016-05-16 DIAGNOSIS — E298 Other testicular dysfunction: Secondary | ICD-10-CM | POA: Diagnosis not present

## 2016-05-16 DIAGNOSIS — Z6822 Body mass index (BMI) 22.0-22.9, adult: Secondary | ICD-10-CM | POA: Diagnosis not present

## 2016-05-16 DIAGNOSIS — R3129 Other microscopic hematuria: Secondary | ICD-10-CM | POA: Diagnosis not present

## 2016-05-16 DIAGNOSIS — F329 Major depressive disorder, single episode, unspecified: Secondary | ICD-10-CM | POA: Diagnosis not present

## 2016-05-16 DIAGNOSIS — Z1389 Encounter for screening for other disorder: Secondary | ICD-10-CM | POA: Diagnosis not present

## 2016-05-16 DIAGNOSIS — R634 Abnormal weight loss: Secondary | ICD-10-CM | POA: Diagnosis not present

## 2016-05-16 DIAGNOSIS — Z0000 Encounter for general adult medical examination without abnormal findings: Secondary | ICD-10-CM | POA: Diagnosis not present

## 2016-05-16 DIAGNOSIS — C679 Malignant neoplasm of bladder, unspecified: Secondary | ICD-10-CM | POA: Diagnosis not present

## 2016-05-20 ENCOUNTER — Other Ambulatory Visit: Payer: Self-pay | Admitting: Internal Medicine

## 2016-05-20 DIAGNOSIS — R413 Other amnesia: Secondary | ICD-10-CM

## 2016-05-21 DIAGNOSIS — Z1212 Encounter for screening for malignant neoplasm of rectum: Secondary | ICD-10-CM | POA: Diagnosis not present

## 2016-05-27 ENCOUNTER — Ambulatory Visit
Admission: RE | Admit: 2016-05-27 | Discharge: 2016-05-27 | Disposition: A | Discharge status: Home / Self Care | Payer: PPO | Source: Ambulatory Visit | Attending: Internal Medicine | Admitting: Internal Medicine

## 2016-05-27 DIAGNOSIS — R413 Other amnesia: Secondary | ICD-10-CM

## 2016-06-09 DIAGNOSIS — H0014 Chalazion left upper eyelid: Secondary | ICD-10-CM | POA: Diagnosis not present

## 2016-09-10 DIAGNOSIS — Z9849 Cataract extraction status, unspecified eye: Secondary | ICD-10-CM | POA: Diagnosis not present

## 2016-09-10 DIAGNOSIS — H18413 Arcus senilis, bilateral: Secondary | ICD-10-CM | POA: Diagnosis not present

## 2016-09-10 DIAGNOSIS — H52223 Regular astigmatism, bilateral: Secondary | ICD-10-CM | POA: Diagnosis not present

## 2016-09-10 DIAGNOSIS — Z961 Presence of intraocular lens: Secondary | ICD-10-CM | POA: Diagnosis not present

## 2016-09-10 DIAGNOSIS — D3130 Benign neoplasm of unspecified choroid: Secondary | ICD-10-CM | POA: Diagnosis not present

## 2016-09-10 DIAGNOSIS — H524 Presbyopia: Secondary | ICD-10-CM | POA: Diagnosis not present

## 2016-09-10 DIAGNOSIS — H04123 Dry eye syndrome of bilateral lacrimal glands: Secondary | ICD-10-CM | POA: Diagnosis not present

## 2016-09-10 DIAGNOSIS — H11153 Pinguecula, bilateral: Secondary | ICD-10-CM | POA: Diagnosis not present

## 2016-09-10 DIAGNOSIS — H11423 Conjunctival edema, bilateral: Secondary | ICD-10-CM | POA: Diagnosis not present

## 2016-09-10 DIAGNOSIS — H5202 Hypermetropia, left eye: Secondary | ICD-10-CM | POA: Diagnosis not present

## 2016-10-03 ENCOUNTER — Other Ambulatory Visit: Payer: Self-pay | Admitting: Cardiovascular Disease

## 2016-10-06 ENCOUNTER — Other Ambulatory Visit: Payer: Self-pay

## 2016-10-06 NOTE — Telephone Encounter (Signed)
Please review for refill. Thanks!  

## 2016-11-13 DIAGNOSIS — R7301 Impaired fasting glucose: Secondary | ICD-10-CM | POA: Diagnosis not present

## 2016-11-13 DIAGNOSIS — I10 Essential (primary) hypertension: Secondary | ICD-10-CM | POA: Diagnosis not present

## 2016-11-13 DIAGNOSIS — N401 Enlarged prostate with lower urinary tract symptoms: Secondary | ICD-10-CM | POA: Diagnosis not present

## 2016-11-13 DIAGNOSIS — R413 Other amnesia: Secondary | ICD-10-CM | POA: Diagnosis not present

## 2016-11-13 DIAGNOSIS — Z6823 Body mass index (BMI) 23.0-23.9, adult: Secondary | ICD-10-CM | POA: Diagnosis not present

## 2016-12-09 ENCOUNTER — Other Ambulatory Visit: Payer: Self-pay | Admitting: Cardiovascular Disease

## 2017-01-05 DIAGNOSIS — N99111 Postprocedural bulbous urethral stricture: Secondary | ICD-10-CM | POA: Diagnosis not present

## 2017-01-05 DIAGNOSIS — Z8551 Personal history of malignant neoplasm of bladder: Secondary | ICD-10-CM | POA: Diagnosis not present

## 2017-01-29 DIAGNOSIS — I739 Peripheral vascular disease, unspecified: Secondary | ICD-10-CM | POA: Diagnosis not present

## 2017-01-29 DIAGNOSIS — H539 Unspecified visual disturbance: Secondary | ICD-10-CM | POA: Diagnosis not present

## 2017-01-29 DIAGNOSIS — R42 Dizziness and giddiness: Secondary | ICD-10-CM | POA: Diagnosis not present

## 2017-01-29 DIAGNOSIS — R5383 Other fatigue: Secondary | ICD-10-CM | POA: Diagnosis not present

## 2017-01-29 DIAGNOSIS — R112 Nausea with vomiting, unspecified: Secondary | ICD-10-CM | POA: Diagnosis not present

## 2017-01-29 DIAGNOSIS — I10 Essential (primary) hypertension: Secondary | ICD-10-CM | POA: Diagnosis not present

## 2017-01-30 ENCOUNTER — Other Ambulatory Visit: Payer: Self-pay | Admitting: Internal Medicine

## 2017-01-30 DIAGNOSIS — H539 Unspecified visual disturbance: Secondary | ICD-10-CM

## 2017-01-30 DIAGNOSIS — R42 Dizziness and giddiness: Secondary | ICD-10-CM

## 2017-01-30 DIAGNOSIS — I639 Cerebral infarction, unspecified: Secondary | ICD-10-CM | POA: Diagnosis not present

## 2017-02-02 ENCOUNTER — Other Ambulatory Visit: Payer: Self-pay | Admitting: Internal Medicine

## 2017-02-02 DIAGNOSIS — H539 Unspecified visual disturbance: Secondary | ICD-10-CM

## 2017-02-02 DIAGNOSIS — I69998 Other sequelae following unspecified cerebrovascular disease: Principal | ICD-10-CM

## 2017-02-04 ENCOUNTER — Ambulatory Visit (HOSPITAL_COMMUNITY)
Admission: RE | Admit: 2017-02-04 | Discharge: 2017-02-04 | Disposition: A | Discharge status: Home / Self Care | Payer: PPO | Source: Ambulatory Visit | Attending: Vascular Surgery | Admitting: Vascular Surgery

## 2017-02-04 ENCOUNTER — Other Ambulatory Visit (HOSPITAL_COMMUNITY): Payer: Self-pay | Admitting: Internal Medicine

## 2017-02-04 DIAGNOSIS — I6381 Other cerebral infarction due to occlusion or stenosis of small artery: Secondary | ICD-10-CM

## 2017-02-04 DIAGNOSIS — I639 Cerebral infarction, unspecified: Secondary | ICD-10-CM

## 2017-02-04 LAB — VAS US CAROTID
LCCAPDIAS: 24 cm/s
LEFT ECA DIAS: -14 cm/s
LICADDIAS: -21 cm/s
LICAPDIAS: -25 cm/s
Left CCA dist dias: 21 cm/s
Left CCA dist sys: 96 cm/s
Left CCA prox sys: 124 cm/s
Left ICA dist sys: -67 cm/s
Left ICA prox sys: -93 cm/s
RCCADSYS: -88 cm/s
RCCAPDIAS: 16 cm/s
RIGHT CCA MID DIAS: 13 cm/s
RIGHT ECA DIAS: 9 cm/s
Right CCA prox sys: 65 cm/s

## 2017-02-12 DIAGNOSIS — Z6822 Body mass index (BMI) 22.0-22.9, adult: Secondary | ICD-10-CM | POA: Diagnosis not present

## 2017-02-12 DIAGNOSIS — R7301 Impaired fasting glucose: Secondary | ICD-10-CM | POA: Diagnosis not present

## 2017-02-12 DIAGNOSIS — I10 Essential (primary) hypertension: Secondary | ICD-10-CM | POA: Diagnosis not present

## 2017-02-12 DIAGNOSIS — I638 Other cerebral infarction: Secondary | ICD-10-CM | POA: Diagnosis not present

## 2017-02-12 DIAGNOSIS — I7389 Other specified peripheral vascular diseases: Secondary | ICD-10-CM | POA: Diagnosis not present

## 2017-02-12 DIAGNOSIS — H539 Unspecified visual disturbance: Secondary | ICD-10-CM | POA: Diagnosis not present

## 2017-02-25 DIAGNOSIS — D3131 Benign neoplasm of right choroid: Secondary | ICD-10-CM | POA: Diagnosis not present

## 2017-02-25 DIAGNOSIS — H40013 Open angle with borderline findings, low risk, bilateral: Secondary | ICD-10-CM | POA: Diagnosis not present

## 2017-02-25 DIAGNOSIS — H524 Presbyopia: Secondary | ICD-10-CM | POA: Diagnosis not present

## 2017-02-25 DIAGNOSIS — H5202 Hypermetropia, left eye: Secondary | ICD-10-CM | POA: Diagnosis not present

## 2017-02-25 DIAGNOSIS — H52223 Regular astigmatism, bilateral: Secondary | ICD-10-CM | POA: Diagnosis not present

## 2017-02-25 DIAGNOSIS — H35033 Hypertensive retinopathy, bilateral: Secondary | ICD-10-CM | POA: Diagnosis not present

## 2017-02-25 DIAGNOSIS — I10 Essential (primary) hypertension: Secondary | ICD-10-CM | POA: Diagnosis not present

## 2017-06-29 DIAGNOSIS — N390 Urinary tract infection, site not specified: Secondary | ICD-10-CM | POA: Diagnosis not present

## 2017-06-29 DIAGNOSIS — R7301 Impaired fasting glucose: Secondary | ICD-10-CM | POA: Diagnosis not present

## 2017-06-29 DIAGNOSIS — E538 Deficiency of other specified B group vitamins: Secondary | ICD-10-CM | POA: Diagnosis not present

## 2017-06-29 DIAGNOSIS — R8299 Other abnormal findings in urine: Secondary | ICD-10-CM | POA: Diagnosis not present

## 2017-06-29 DIAGNOSIS — Z125 Encounter for screening for malignant neoplasm of prostate: Secondary | ICD-10-CM | POA: Diagnosis not present

## 2017-06-29 DIAGNOSIS — I10 Essential (primary) hypertension: Secondary | ICD-10-CM | POA: Diagnosis not present

## 2017-06-29 DIAGNOSIS — E784 Other hyperlipidemia: Secondary | ICD-10-CM | POA: Diagnosis not present

## 2017-07-06 DIAGNOSIS — Z0000 Encounter for general adult medical examination without abnormal findings: Secondary | ICD-10-CM | POA: Diagnosis not present

## 2017-07-06 DIAGNOSIS — N401 Enlarged prostate with lower urinary tract symptoms: Secondary | ICD-10-CM | POA: Diagnosis not present

## 2017-07-06 DIAGNOSIS — R42 Dizziness and giddiness: Secondary | ICD-10-CM | POA: Diagnosis not present

## 2017-07-06 DIAGNOSIS — I638 Other cerebral infarction: Secondary | ICD-10-CM | POA: Diagnosis not present

## 2017-07-06 DIAGNOSIS — C679 Malignant neoplasm of bladder, unspecified: Secondary | ICD-10-CM | POA: Diagnosis not present

## 2017-07-06 DIAGNOSIS — I7389 Other specified peripheral vascular diseases: Secondary | ICD-10-CM | POA: Diagnosis not present

## 2017-07-06 DIAGNOSIS — R808 Other proteinuria: Secondary | ICD-10-CM | POA: Diagnosis not present

## 2017-07-06 DIAGNOSIS — R413 Other amnesia: Secondary | ICD-10-CM | POA: Diagnosis not present

## 2017-07-06 DIAGNOSIS — R7301 Impaired fasting glucose: Secondary | ICD-10-CM | POA: Diagnosis not present

## 2017-07-06 DIAGNOSIS — Z1389 Encounter for screening for other disorder: Secondary | ICD-10-CM | POA: Diagnosis not present

## 2017-07-06 DIAGNOSIS — H538 Other visual disturbances: Secondary | ICD-10-CM | POA: Diagnosis not present

## 2017-07-06 DIAGNOSIS — Z6823 Body mass index (BMI) 23.0-23.9, adult: Secondary | ICD-10-CM | POA: Diagnosis not present

## 2017-07-07 DIAGNOSIS — Z1212 Encounter for screening for malignant neoplasm of rectum: Secondary | ICD-10-CM | POA: Diagnosis not present

## 2017-11-11 DIAGNOSIS — N401 Enlarged prostate with lower urinary tract symptoms: Secondary | ICD-10-CM | POA: Diagnosis not present

## 2017-11-11 DIAGNOSIS — E298 Other testicular dysfunction: Secondary | ICD-10-CM | POA: Diagnosis not present

## 2017-12-17 DIAGNOSIS — H52223 Regular astigmatism, bilateral: Secondary | ICD-10-CM | POA: Diagnosis not present

## 2017-12-17 DIAGNOSIS — H43812 Vitreous degeneration, left eye: Secondary | ICD-10-CM | POA: Diagnosis not present

## 2017-12-17 DIAGNOSIS — H11153 Pinguecula, bilateral: Secondary | ICD-10-CM | POA: Diagnosis not present

## 2017-12-17 DIAGNOSIS — Z961 Presence of intraocular lens: Secondary | ICD-10-CM | POA: Diagnosis not present

## 2017-12-17 DIAGNOSIS — H40003 Preglaucoma, unspecified, bilateral: Secondary | ICD-10-CM | POA: Diagnosis not present

## 2017-12-17 DIAGNOSIS — H11441 Conjunctival cysts, right eye: Secondary | ICD-10-CM | POA: Diagnosis not present

## 2017-12-17 DIAGNOSIS — H04123 Dry eye syndrome of bilateral lacrimal glands: Secondary | ICD-10-CM | POA: Diagnosis not present

## 2017-12-17 DIAGNOSIS — H40013 Open angle with borderline findings, low risk, bilateral: Secondary | ICD-10-CM | POA: Diagnosis not present

## 2017-12-17 DIAGNOSIS — H5202 Hypermetropia, left eye: Secondary | ICD-10-CM | POA: Diagnosis not present

## 2017-12-17 DIAGNOSIS — H18413 Arcus senilis, bilateral: Secondary | ICD-10-CM | POA: Diagnosis not present

## 2017-12-17 DIAGNOSIS — D3131 Benign neoplasm of right choroid: Secondary | ICD-10-CM | POA: Diagnosis not present

## 2018-01-04 DIAGNOSIS — R413 Other amnesia: Secondary | ICD-10-CM | POA: Diagnosis not present

## 2018-01-04 DIAGNOSIS — I10 Essential (primary) hypertension: Secondary | ICD-10-CM | POA: Diagnosis not present

## 2018-01-04 DIAGNOSIS — Z6824 Body mass index (BMI) 24.0-24.9, adult: Secondary | ICD-10-CM | POA: Diagnosis not present

## 2018-01-04 DIAGNOSIS — D692 Other nonthrombocytopenic purpura: Secondary | ICD-10-CM | POA: Diagnosis not present

## 2018-01-04 DIAGNOSIS — I6389 Other cerebral infarction: Secondary | ICD-10-CM | POA: Diagnosis not present

## 2018-01-06 DIAGNOSIS — N3943 Post-void dribbling: Secondary | ICD-10-CM | POA: Diagnosis not present

## 2018-01-06 DIAGNOSIS — N99111 Postprocedural bulbous urethral stricture: Secondary | ICD-10-CM | POA: Diagnosis not present

## 2018-01-06 DIAGNOSIS — Z8551 Personal history of malignant neoplasm of bladder: Secondary | ICD-10-CM | POA: Diagnosis not present

## 2018-01-25 DIAGNOSIS — T1490XA Injury, unspecified, initial encounter: Secondary | ICD-10-CM | POA: Diagnosis not present

## 2018-08-02 DIAGNOSIS — R7301 Impaired fasting glucose: Secondary | ICD-10-CM | POA: Diagnosis not present

## 2018-08-02 DIAGNOSIS — E7849 Other hyperlipidemia: Secondary | ICD-10-CM | POA: Diagnosis not present

## 2018-08-02 DIAGNOSIS — R82998 Other abnormal findings in urine: Secondary | ICD-10-CM | POA: Diagnosis not present

## 2018-08-02 DIAGNOSIS — I10 Essential (primary) hypertension: Secondary | ICD-10-CM | POA: Diagnosis not present

## 2018-08-02 DIAGNOSIS — E538 Deficiency of other specified B group vitamins: Secondary | ICD-10-CM | POA: Diagnosis not present

## 2018-08-02 DIAGNOSIS — Z125 Encounter for screening for malignant neoplasm of prostate: Secondary | ICD-10-CM | POA: Diagnosis not present

## 2018-08-11 DIAGNOSIS — Z6822 Body mass index (BMI) 22.0-22.9, adult: Secondary | ICD-10-CM | POA: Diagnosis not present

## 2018-08-11 DIAGNOSIS — E538 Deficiency of other specified B group vitamins: Secondary | ICD-10-CM | POA: Diagnosis not present

## 2018-08-11 DIAGNOSIS — Z1389 Encounter for screening for other disorder: Secondary | ICD-10-CM | POA: Diagnosis not present

## 2018-08-11 DIAGNOSIS — D126 Benign neoplasm of colon, unspecified: Secondary | ICD-10-CM | POA: Diagnosis not present

## 2018-08-11 DIAGNOSIS — I6389 Other cerebral infarction: Secondary | ICD-10-CM | POA: Diagnosis not present

## 2018-08-11 DIAGNOSIS — D692 Other nonthrombocytopenic purpura: Secondary | ICD-10-CM | POA: Diagnosis not present

## 2018-08-11 DIAGNOSIS — C679 Malignant neoplasm of bladder, unspecified: Secondary | ICD-10-CM | POA: Diagnosis not present

## 2018-08-11 DIAGNOSIS — Z23 Encounter for immunization: Secondary | ICD-10-CM | POA: Diagnosis not present

## 2018-08-11 DIAGNOSIS — N401 Enlarged prostate with lower urinary tract symptoms: Secondary | ICD-10-CM | POA: Diagnosis not present

## 2018-08-11 DIAGNOSIS — I7389 Other specified peripheral vascular diseases: Secondary | ICD-10-CM | POA: Diagnosis not present

## 2018-08-11 DIAGNOSIS — H9193 Unspecified hearing loss, bilateral: Secondary | ICD-10-CM | POA: Diagnosis not present

## 2018-08-11 DIAGNOSIS — R413 Other amnesia: Secondary | ICD-10-CM | POA: Diagnosis not present

## 2018-08-11 DIAGNOSIS — Z0000 Encounter for general adult medical examination without abnormal findings: Secondary | ICD-10-CM | POA: Diagnosis not present

## 2018-08-17 DIAGNOSIS — Z1212 Encounter for screening for malignant neoplasm of rectum: Secondary | ICD-10-CM | POA: Diagnosis not present

## 2018-08-20 ENCOUNTER — Other Ambulatory Visit (HOSPITAL_COMMUNITY): Payer: Self-pay | Admitting: Internal Medicine

## 2018-08-20 DIAGNOSIS — R1310 Dysphagia, unspecified: Secondary | ICD-10-CM

## 2018-08-27 ENCOUNTER — Ambulatory Visit (HOSPITAL_COMMUNITY)
Admission: RE | Admit: 2018-08-27 | Discharge: 2018-08-27 | Disposition: A | Discharge status: Home / Self Care | Payer: PPO | Source: Ambulatory Visit | Attending: Internal Medicine | Admitting: Internal Medicine

## 2018-08-27 DIAGNOSIS — E785 Hyperlipidemia, unspecified: Secondary | ICD-10-CM | POA: Insufficient documentation

## 2018-08-27 DIAGNOSIS — R1310 Dysphagia, unspecified: Secondary | ICD-10-CM | POA: Diagnosis not present

## 2018-08-27 DIAGNOSIS — I739 Peripheral vascular disease, unspecified: Secondary | ICD-10-CM | POA: Diagnosis not present

## 2018-08-27 DIAGNOSIS — R1314 Dysphagia, pharyngoesophageal phase: Secondary | ICD-10-CM | POA: Diagnosis not present

## 2018-08-27 DIAGNOSIS — M5030 Other cervical disc degeneration, unspecified cervical region: Secondary | ICD-10-CM | POA: Diagnosis not present

## 2018-08-27 DIAGNOSIS — Z8711 Personal history of peptic ulcer disease: Secondary | ICD-10-CM | POA: Insufficient documentation

## 2018-08-27 DIAGNOSIS — I10 Essential (primary) hypertension: Secondary | ICD-10-CM | POA: Insufficient documentation

## 2018-08-27 DIAGNOSIS — Z87891 Personal history of nicotine dependence: Secondary | ICD-10-CM | POA: Insufficient documentation

## 2018-08-27 DIAGNOSIS — M2578 Osteophyte, vertebrae: Secondary | ICD-10-CM | POA: Insufficient documentation

## 2018-08-27 DIAGNOSIS — F329 Major depressive disorder, single episode, unspecified: Secondary | ICD-10-CM | POA: Diagnosis not present

## 2018-08-27 DIAGNOSIS — Z8673 Personal history of transient ischemic attack (TIA), and cerebral infarction without residual deficits: Secondary | ICD-10-CM | POA: Diagnosis not present

## 2018-08-27 DIAGNOSIS — R05 Cough: Secondary | ICD-10-CM | POA: Insufficient documentation

## 2018-08-27 DIAGNOSIS — N400 Enlarged prostate without lower urinary tract symptoms: Secondary | ICD-10-CM | POA: Insufficient documentation

## 2018-10-15 DIAGNOSIS — H5202 Hypermetropia, left eye: Secondary | ICD-10-CM | POA: Diagnosis not present

## 2018-10-15 DIAGNOSIS — H18413 Arcus senilis, bilateral: Secondary | ICD-10-CM | POA: Diagnosis not present

## 2018-10-15 DIAGNOSIS — H5211 Myopia, right eye: Secondary | ICD-10-CM | POA: Diagnosis not present

## 2018-10-15 DIAGNOSIS — H524 Presbyopia: Secondary | ICD-10-CM | POA: Diagnosis not present

## 2018-10-15 DIAGNOSIS — H40011 Open angle with borderline findings, low risk, right eye: Secondary | ICD-10-CM | POA: Diagnosis not present

## 2018-10-15 DIAGNOSIS — Z961 Presence of intraocular lens: Secondary | ICD-10-CM | POA: Diagnosis not present

## 2018-10-15 DIAGNOSIS — H11153 Pinguecula, bilateral: Secondary | ICD-10-CM | POA: Diagnosis not present

## 2018-10-15 DIAGNOSIS — H52223 Regular astigmatism, bilateral: Secondary | ICD-10-CM | POA: Diagnosis not present

## 2018-10-15 DIAGNOSIS — H5111 Convergence insufficiency: Secondary | ICD-10-CM | POA: Diagnosis not present

## 2019-02-22 DIAGNOSIS — R1319 Other dysphagia: Secondary | ICD-10-CM | POA: Diagnosis not present

## 2019-02-22 DIAGNOSIS — L570 Actinic keratosis: Secondary | ICD-10-CM | POA: Diagnosis not present

## 2019-02-22 DIAGNOSIS — I6389 Other cerebral infarction: Secondary | ICD-10-CM | POA: Diagnosis not present

## 2019-02-22 DIAGNOSIS — I10 Essential (primary) hypertension: Secondary | ICD-10-CM | POA: Diagnosis not present

## 2019-02-22 DIAGNOSIS — Z6823 Body mass index (BMI) 23.0-23.9, adult: Secondary | ICD-10-CM | POA: Diagnosis not present

## 2019-02-22 DIAGNOSIS — D692 Other nonthrombocytopenic purpura: Secondary | ICD-10-CM | POA: Diagnosis not present

## 2019-02-22 DIAGNOSIS — M1990 Unspecified osteoarthritis, unspecified site: Secondary | ICD-10-CM | POA: Diagnosis not present

## 2019-02-22 DIAGNOSIS — I7389 Other specified peripheral vascular diseases: Secondary | ICD-10-CM | POA: Diagnosis not present

## 2019-02-22 DIAGNOSIS — F3289 Other specified depressive episodes: Secondary | ICD-10-CM | POA: Diagnosis not present

## 2019-02-24 DIAGNOSIS — Z8551 Personal history of malignant neoplasm of bladder: Secondary | ICD-10-CM | POA: Diagnosis not present

## 2019-02-24 DIAGNOSIS — N99111 Postprocedural bulbous urethral stricture: Secondary | ICD-10-CM | POA: Diagnosis not present

## 2019-09-07 DIAGNOSIS — I10 Essential (primary) hypertension: Secondary | ICD-10-CM | POA: Diagnosis not present

## 2019-09-07 DIAGNOSIS — Z125 Encounter for screening for malignant neoplasm of prostate: Secondary | ICD-10-CM | POA: Diagnosis not present

## 2019-09-07 DIAGNOSIS — E538 Deficiency of other specified B group vitamins: Secondary | ICD-10-CM | POA: Diagnosis not present

## 2019-09-07 DIAGNOSIS — R7301 Impaired fasting glucose: Secondary | ICD-10-CM | POA: Diagnosis not present

## 2019-09-13 DIAGNOSIS — R809 Proteinuria, unspecified: Secondary | ICD-10-CM | POA: Diagnosis not present

## 2019-09-13 DIAGNOSIS — R82998 Other abnormal findings in urine: Secondary | ICD-10-CM | POA: Diagnosis not present

## 2019-09-14 DIAGNOSIS — D126 Benign neoplasm of colon, unspecified: Secondary | ICD-10-CM | POA: Diagnosis not present

## 2019-09-14 DIAGNOSIS — R809 Proteinuria, unspecified: Secondary | ICD-10-CM | POA: Diagnosis not present

## 2019-09-14 DIAGNOSIS — D692 Other nonthrombocytopenic purpura: Secondary | ICD-10-CM | POA: Diagnosis not present

## 2019-09-14 DIAGNOSIS — R1310 Dysphagia, unspecified: Secondary | ICD-10-CM | POA: Diagnosis not present

## 2019-09-14 DIAGNOSIS — I739 Peripheral vascular disease, unspecified: Secondary | ICD-10-CM | POA: Diagnosis not present

## 2019-09-14 DIAGNOSIS — Z1331 Encounter for screening for depression: Secondary | ICD-10-CM | POA: Diagnosis not present

## 2019-09-14 DIAGNOSIS — Z0000 Encounter for general adult medical examination without abnormal findings: Secondary | ICD-10-CM | POA: Diagnosis not present

## 2019-09-14 DIAGNOSIS — E291 Testicular hypofunction: Secondary | ICD-10-CM | POA: Diagnosis not present

## 2019-09-14 DIAGNOSIS — R634 Abnormal weight loss: Secondary | ICD-10-CM | POA: Diagnosis not present

## 2019-09-14 DIAGNOSIS — C679 Malignant neoplasm of bladder, unspecified: Secondary | ICD-10-CM | POA: Diagnosis not present

## 2019-09-14 DIAGNOSIS — I639 Cerebral infarction, unspecified: Secondary | ICD-10-CM | POA: Diagnosis not present

## 2019-09-14 DIAGNOSIS — R413 Other amnesia: Secondary | ICD-10-CM | POA: Diagnosis not present

## 2019-09-14 DIAGNOSIS — Z1212 Encounter for screening for malignant neoplasm of rectum: Secondary | ICD-10-CM | POA: Diagnosis not present

## 2019-09-14 DIAGNOSIS — N401 Enlarged prostate with lower urinary tract symptoms: Secondary | ICD-10-CM | POA: Diagnosis not present

## 2019-09-15 DIAGNOSIS — Z23 Encounter for immunization: Secondary | ICD-10-CM | POA: Diagnosis not present

## 2019-09-19 DIAGNOSIS — I739 Peripheral vascular disease, unspecified: Secondary | ICD-10-CM | POA: Diagnosis not present

## 2019-09-19 DIAGNOSIS — H9190 Unspecified hearing loss, unspecified ear: Secondary | ICD-10-CM | POA: Diagnosis not present

## 2019-09-19 DIAGNOSIS — I119 Hypertensive heart disease without heart failure: Secondary | ICD-10-CM | POA: Diagnosis not present

## 2019-09-19 DIAGNOSIS — H539 Unspecified visual disturbance: Secondary | ICD-10-CM | POA: Diagnosis not present

## 2019-09-19 DIAGNOSIS — K5790 Diverticulosis of intestine, part unspecified, without perforation or abscess without bleeding: Secondary | ICD-10-CM | POA: Diagnosis not present

## 2019-09-19 DIAGNOSIS — N529 Male erectile dysfunction, unspecified: Secondary | ICD-10-CM | POA: Diagnosis not present

## 2019-09-19 DIAGNOSIS — N401 Enlarged prostate with lower urinary tract symptoms: Secondary | ICD-10-CM | POA: Diagnosis not present

## 2019-09-19 DIAGNOSIS — R1310 Dysphagia, unspecified: Secondary | ICD-10-CM | POA: Diagnosis not present

## 2019-09-19 DIAGNOSIS — E291 Testicular hypofunction: Secondary | ICD-10-CM | POA: Diagnosis not present

## 2019-09-19 DIAGNOSIS — F4320 Adjustment disorder, unspecified: Secondary | ICD-10-CM | POA: Diagnosis not present

## 2019-09-19 DIAGNOSIS — D126 Benign neoplasm of colon, unspecified: Secondary | ICD-10-CM | POA: Diagnosis not present

## 2019-09-19 DIAGNOSIS — M5136 Other intervertebral disc degeneration, lumbar region: Secondary | ICD-10-CM | POA: Diagnosis not present

## 2019-09-19 DIAGNOSIS — F329 Major depressive disorder, single episode, unspecified: Secondary | ICD-10-CM | POA: Diagnosis not present

## 2019-09-19 DIAGNOSIS — R413 Other amnesia: Secondary | ICD-10-CM | POA: Diagnosis not present

## 2019-09-19 DIAGNOSIS — R634 Abnormal weight loss: Secondary | ICD-10-CM | POA: Diagnosis not present

## 2019-09-19 DIAGNOSIS — E538 Deficiency of other specified B group vitamins: Secondary | ICD-10-CM | POA: Diagnosis not present

## 2019-09-19 DIAGNOSIS — D692 Other nonthrombocytopenic purpura: Secondary | ICD-10-CM | POA: Diagnosis not present

## 2019-09-19 DIAGNOSIS — G629 Polyneuropathy, unspecified: Secondary | ICD-10-CM | POA: Diagnosis not present

## 2019-09-19 DIAGNOSIS — E785 Hyperlipidemia, unspecified: Secondary | ICD-10-CM | POA: Diagnosis not present

## 2019-09-19 DIAGNOSIS — N138 Other obstructive and reflux uropathy: Secondary | ICD-10-CM | POA: Diagnosis not present

## 2019-09-19 DIAGNOSIS — R42 Dizziness and giddiness: Secondary | ICD-10-CM | POA: Diagnosis not present

## 2019-09-19 DIAGNOSIS — L570 Actinic keratosis: Secondary | ICD-10-CM | POA: Diagnosis not present

## 2019-09-19 DIAGNOSIS — M47816 Spondylosis without myelopathy or radiculopathy, lumbar region: Secondary | ICD-10-CM | POA: Diagnosis not present

## 2019-09-19 DIAGNOSIS — Z7902 Long term (current) use of antithrombotics/antiplatelets: Secondary | ICD-10-CM | POA: Diagnosis not present

## 2019-09-19 DIAGNOSIS — R7301 Impaired fasting glucose: Secondary | ICD-10-CM | POA: Diagnosis not present

## 2019-09-27 DIAGNOSIS — L570 Actinic keratosis: Secondary | ICD-10-CM | POA: Diagnosis not present

## 2019-09-27 DIAGNOSIS — N138 Other obstructive and reflux uropathy: Secondary | ICD-10-CM | POA: Diagnosis not present

## 2019-09-27 DIAGNOSIS — K5790 Diverticulosis of intestine, part unspecified, without perforation or abscess without bleeding: Secondary | ICD-10-CM | POA: Diagnosis not present

## 2019-09-27 DIAGNOSIS — M47816 Spondylosis without myelopathy or radiculopathy, lumbar region: Secondary | ICD-10-CM | POA: Diagnosis not present

## 2019-09-27 DIAGNOSIS — R1310 Dysphagia, unspecified: Secondary | ICD-10-CM | POA: Diagnosis not present

## 2019-09-27 DIAGNOSIS — R42 Dizziness and giddiness: Secondary | ICD-10-CM | POA: Diagnosis not present

## 2019-09-27 DIAGNOSIS — D692 Other nonthrombocytopenic purpura: Secondary | ICD-10-CM | POA: Diagnosis not present

## 2019-09-27 DIAGNOSIS — F329 Major depressive disorder, single episode, unspecified: Secondary | ICD-10-CM | POA: Diagnosis not present

## 2019-09-27 DIAGNOSIS — H9190 Unspecified hearing loss, unspecified ear: Secondary | ICD-10-CM | POA: Diagnosis not present

## 2019-09-27 DIAGNOSIS — E785 Hyperlipidemia, unspecified: Secondary | ICD-10-CM | POA: Diagnosis not present

## 2019-09-27 DIAGNOSIS — N529 Male erectile dysfunction, unspecified: Secondary | ICD-10-CM | POA: Diagnosis not present

## 2019-09-27 DIAGNOSIS — N401 Enlarged prostate with lower urinary tract symptoms: Secondary | ICD-10-CM | POA: Diagnosis not present

## 2019-09-27 DIAGNOSIS — D126 Benign neoplasm of colon, unspecified: Secondary | ICD-10-CM | POA: Diagnosis not present

## 2019-09-27 DIAGNOSIS — E291 Testicular hypofunction: Secondary | ICD-10-CM | POA: Diagnosis not present

## 2019-09-27 DIAGNOSIS — R413 Other amnesia: Secondary | ICD-10-CM | POA: Diagnosis not present

## 2019-09-27 DIAGNOSIS — I119 Hypertensive heart disease without heart failure: Secondary | ICD-10-CM | POA: Diagnosis not present

## 2019-09-27 DIAGNOSIS — R634 Abnormal weight loss: Secondary | ICD-10-CM | POA: Diagnosis not present

## 2019-09-27 DIAGNOSIS — M5136 Other intervertebral disc degeneration, lumbar region: Secondary | ICD-10-CM | POA: Diagnosis not present

## 2019-09-27 DIAGNOSIS — Z7902 Long term (current) use of antithrombotics/antiplatelets: Secondary | ICD-10-CM | POA: Diagnosis not present

## 2019-09-27 DIAGNOSIS — F4320 Adjustment disorder, unspecified: Secondary | ICD-10-CM | POA: Diagnosis not present

## 2019-09-27 DIAGNOSIS — H539 Unspecified visual disturbance: Secondary | ICD-10-CM | POA: Diagnosis not present

## 2019-09-27 DIAGNOSIS — I739 Peripheral vascular disease, unspecified: Secondary | ICD-10-CM | POA: Diagnosis not present

## 2019-09-27 DIAGNOSIS — R7301 Impaired fasting glucose: Secondary | ICD-10-CM | POA: Diagnosis not present

## 2019-09-27 DIAGNOSIS — G629 Polyneuropathy, unspecified: Secondary | ICD-10-CM | POA: Diagnosis not present

## 2019-09-27 DIAGNOSIS — E538 Deficiency of other specified B group vitamins: Secondary | ICD-10-CM | POA: Diagnosis not present

## 2019-10-10 DIAGNOSIS — I119 Hypertensive heart disease without heart failure: Secondary | ICD-10-CM | POA: Diagnosis not present

## 2019-10-10 DIAGNOSIS — N529 Male erectile dysfunction, unspecified: Secondary | ICD-10-CM | POA: Diagnosis not present

## 2019-10-10 DIAGNOSIS — R413 Other amnesia: Secondary | ICD-10-CM | POA: Diagnosis not present

## 2019-10-10 DIAGNOSIS — K5790 Diverticulosis of intestine, part unspecified, without perforation or abscess without bleeding: Secondary | ICD-10-CM | POA: Diagnosis not present

## 2019-10-10 DIAGNOSIS — E291 Testicular hypofunction: Secondary | ICD-10-CM | POA: Diagnosis not present

## 2019-10-10 DIAGNOSIS — H9190 Unspecified hearing loss, unspecified ear: Secondary | ICD-10-CM | POA: Diagnosis not present

## 2019-10-10 DIAGNOSIS — R42 Dizziness and giddiness: Secondary | ICD-10-CM | POA: Diagnosis not present

## 2019-10-10 DIAGNOSIS — Z7902 Long term (current) use of antithrombotics/antiplatelets: Secondary | ICD-10-CM | POA: Diagnosis not present

## 2019-10-10 DIAGNOSIS — H539 Unspecified visual disturbance: Secondary | ICD-10-CM | POA: Diagnosis not present

## 2019-10-10 DIAGNOSIS — I739 Peripheral vascular disease, unspecified: Secondary | ICD-10-CM | POA: Diagnosis not present

## 2019-10-10 DIAGNOSIS — N401 Enlarged prostate with lower urinary tract symptoms: Secondary | ICD-10-CM | POA: Diagnosis not present

## 2019-10-10 DIAGNOSIS — R7301 Impaired fasting glucose: Secondary | ICD-10-CM | POA: Diagnosis not present

## 2019-10-10 DIAGNOSIS — N138 Other obstructive and reflux uropathy: Secondary | ICD-10-CM | POA: Diagnosis not present

## 2019-10-10 DIAGNOSIS — F4320 Adjustment disorder, unspecified: Secondary | ICD-10-CM | POA: Diagnosis not present

## 2019-10-10 DIAGNOSIS — F329 Major depressive disorder, single episode, unspecified: Secondary | ICD-10-CM | POA: Diagnosis not present

## 2019-10-10 DIAGNOSIS — G629 Polyneuropathy, unspecified: Secondary | ICD-10-CM | POA: Diagnosis not present

## 2019-10-10 DIAGNOSIS — D126 Benign neoplasm of colon, unspecified: Secondary | ICD-10-CM | POA: Diagnosis not present

## 2019-10-10 DIAGNOSIS — D692 Other nonthrombocytopenic purpura: Secondary | ICD-10-CM | POA: Diagnosis not present

## 2019-10-10 DIAGNOSIS — R1310 Dysphagia, unspecified: Secondary | ICD-10-CM | POA: Diagnosis not present

## 2019-10-10 DIAGNOSIS — E785 Hyperlipidemia, unspecified: Secondary | ICD-10-CM | POA: Diagnosis not present

## 2019-10-10 DIAGNOSIS — R634 Abnormal weight loss: Secondary | ICD-10-CM | POA: Diagnosis not present

## 2019-10-10 DIAGNOSIS — M47816 Spondylosis without myelopathy or radiculopathy, lumbar region: Secondary | ICD-10-CM | POA: Diagnosis not present

## 2019-10-10 DIAGNOSIS — M5136 Other intervertebral disc degeneration, lumbar region: Secondary | ICD-10-CM | POA: Diagnosis not present

## 2019-10-10 DIAGNOSIS — L570 Actinic keratosis: Secondary | ICD-10-CM | POA: Diagnosis not present

## 2019-10-10 DIAGNOSIS — E538 Deficiency of other specified B group vitamins: Secondary | ICD-10-CM | POA: Diagnosis not present

## 2020-02-10 ENCOUNTER — Ambulatory Visit: Payer: PPO | Attending: Internal Medicine

## 2020-02-10 DIAGNOSIS — Z23 Encounter for immunization: Secondary | ICD-10-CM

## 2020-02-10 NOTE — Progress Notes (Signed)
   Covid-19 Vaccination Clinic  Name:  CAMILE RUDDLE    MRN: [id number] DOB: 02/17/1930  02/10/2020  Mr. Writt was observed post Covid-19 immunization for 15 minutes without incidence. He was provided with Vaccine Information Sheet and instruction to access the V-Safe system.   Mr. Gongwer was instructed to call 911 with any severe reactions post vaccine: Marland Kitchen Difficulty breathing  . Swelling of your face and throat  . A fast heartbeat  . A bad rash all over your body  . Dizziness and weakness    Immunizations Administered    Name Date Dose VIS Date Route   Pfizer COVID-19 Vaccine 02/10/2020 12:33 PM 0.3 mL 11/25/2019 Intramuscular   Manufacturer: [place]   Lot: [id number]   [place]: [id number]

## 2020-03-06 ENCOUNTER — Ambulatory Visit: Payer: PPO | Attending: Internal Medicine

## 2020-03-06 DIAGNOSIS — Z23 Encounter for immunization: Secondary | ICD-10-CM

## 2020-03-06 NOTE — Progress Notes (Signed)
   Covid-19 Vaccination Clinic  Name:  Paul [name]    MRN: [id number] DOB: [birth year]  03/06/2020  [name] was observed post Covid-19 immunization for 15 minutes without incident. He was provided with Vaccine Information Sheet and instruction to access the V-Safe system.   [name] was instructed to call 911 with any severe reactions post vaccine: Marland Kitchen Difficulty breathing  . Swelling of face and throat  . A fast heartbeat  . A bad rash all over body  . Dizziness and weakness   Immunizations Administered    Name Date Dose VIS Date Route   Pfizer COVID-19 Vaccine 03/06/2020  3:37 PM 0.3 mL 11/25/2019 Intramuscular   Manufacturer: [place]   Lot: [id number]   [place]: [id number]

## 2020-03-21 DIAGNOSIS — R634 Abnormal weight loss: Secondary | ICD-10-CM | POA: Diagnosis not present

## 2020-03-21 DIAGNOSIS — C679 Malignant neoplasm of bladder, unspecified: Secondary | ICD-10-CM | POA: Diagnosis not present

## 2020-03-21 DIAGNOSIS — I10 Essential (primary) hypertension: Secondary | ICD-10-CM | POA: Diagnosis not present

## 2020-03-21 DIAGNOSIS — R7301 Impaired fasting glucose: Secondary | ICD-10-CM | POA: Diagnosis not present

## 2020-03-21 DIAGNOSIS — D692 Other nonthrombocytopenic purpura: Secondary | ICD-10-CM | POA: Diagnosis not present

## 2020-03-21 DIAGNOSIS — F0390 Unspecified dementia without behavioral disturbance: Secondary | ICD-10-CM | POA: Diagnosis not present

## 2020-03-21 DIAGNOSIS — I639 Cerebral infarction, unspecified: Secondary | ICD-10-CM | POA: Diagnosis not present

## 2020-03-21 DIAGNOSIS — F329 Major depressive disorder, single episode, unspecified: Secondary | ICD-10-CM | POA: Diagnosis not present

## 2020-03-21 DIAGNOSIS — R2689 Other abnormalities of gait and mobility: Secondary | ICD-10-CM | POA: Diagnosis not present

## 2020-04-24 ENCOUNTER — Emergency Department (HOSPITAL_COMMUNITY): Payer: PPO

## 2020-04-24 ENCOUNTER — Encounter (HOSPITAL_COMMUNITY): Payer: Self-pay | Admitting: Emergency Medicine

## 2020-04-24 ENCOUNTER — Emergency Department (HOSPITAL_COMMUNITY)
Admission: EM | Admit: 2020-04-24 | Discharge: 2020-04-25 | Disposition: A | Discharge status: Home / Self Care | Payer: PPO | Attending: Emergency Medicine | Admitting: Emergency Medicine

## 2020-04-24 ENCOUNTER — Other Ambulatory Visit: Payer: Self-pay

## 2020-04-24 DIAGNOSIS — S3991XA Unspecified injury of abdomen, initial encounter: Secondary | ICD-10-CM | POA: Diagnosis present

## 2020-04-24 DIAGNOSIS — I10 Essential (primary) hypertension: Secondary | ICD-10-CM | POA: Diagnosis not present

## 2020-04-24 DIAGNOSIS — S2242XA Multiple fractures of ribs, left side, initial encounter for closed fracture: Secondary | ICD-10-CM | POA: Insufficient documentation

## 2020-04-24 DIAGNOSIS — N3090 Cystitis, unspecified without hematuria: Secondary | ICD-10-CM | POA: Insufficient documentation

## 2020-04-24 DIAGNOSIS — Y92018 Other place in single-family (private) house as the place of occurrence of the external cause: Secondary | ICD-10-CM | POA: Insufficient documentation

## 2020-04-24 DIAGNOSIS — W19XXXA Unspecified fall, initial encounter: Secondary | ICD-10-CM

## 2020-04-24 DIAGNOSIS — N400 Enlarged prostate without lower urinary tract symptoms: Secondary | ICD-10-CM | POA: Diagnosis not present

## 2020-04-24 DIAGNOSIS — R52 Pain, unspecified: Secondary | ICD-10-CM

## 2020-04-24 DIAGNOSIS — Z8551 Personal history of malignant neoplasm of bladder: Secondary | ICD-10-CM | POA: Diagnosis not present

## 2020-04-24 DIAGNOSIS — S0990XA Unspecified injury of head, initial encounter: Secondary | ICD-10-CM | POA: Diagnosis not present

## 2020-04-24 DIAGNOSIS — S3992XA Unspecified injury of lower back, initial encounter: Secondary | ICD-10-CM | POA: Diagnosis not present

## 2020-04-24 DIAGNOSIS — Z79899 Other long term (current) drug therapy: Secondary | ICD-10-CM | POA: Diagnosis not present

## 2020-04-24 DIAGNOSIS — Z87891 Personal history of nicotine dependence: Secondary | ICD-10-CM | POA: Diagnosis not present

## 2020-04-24 DIAGNOSIS — S301XXA Contusion of abdominal wall, initial encounter: Secondary | ICD-10-CM | POA: Insufficient documentation

## 2020-04-24 DIAGNOSIS — W01198A Fall on same level from slipping, tripping and stumbling with subsequent striking against other object, initial encounter: Secondary | ICD-10-CM | POA: Diagnosis not present

## 2020-04-24 DIAGNOSIS — Y9389 Activity, other specified: Secondary | ICD-10-CM | POA: Diagnosis not present

## 2020-04-24 DIAGNOSIS — Y998 Other external cause status: Secondary | ICD-10-CM | POA: Diagnosis not present

## 2020-04-24 LAB — URINALYSIS, ROUTINE W REFLEX MICROSCOPIC
Bilirubin Urine: NEGATIVE
Glucose, UA: NEGATIVE mg/dL
Ketones, ur: NEGATIVE mg/dL
Nitrite: POSITIVE — AB
Protein, ur: 30 mg/dL — AB
Specific Gravity, Urine: 1.025 (ref 1.005–1.030)
pH: 5 (ref 5.0–8.0)

## 2020-04-24 LAB — I-STAT CHEM 8, ED
BUN: 23 mg/dL (ref 8–23)
Calcium, Ion: 1.19 mmol/L (ref 1.15–1.40)
Chloride: 103 mmol/L (ref 98–111)
Creatinine, Ser: 1.3 mg/dL — ABNORMAL HIGH (ref 0.61–1.24)
Glucose, Bld: 127 mg/dL — ABNORMAL HIGH (ref 70–99)
HCT: 43 % (ref 39.0–52.0)
Hemoglobin: 14.6 g/dL (ref 13.0–17.0)
Potassium: 4.3 mmol/L (ref 3.5–5.1)
Sodium: 142 mmol/L (ref 135–145)
TCO2: 27 mmol/L (ref 22–32)

## 2020-04-24 LAB — CBG MONITORING, ED: Glucose-Capillary: 129 mg/dL — ABNORMAL HIGH (ref 70–99)

## 2020-04-24 MED ORDER — ACETAMINOPHEN 325 MG PO TABS
650.0000 mg | ORAL_TABLET | Freq: Once | ORAL | Status: AC
  Administered 2020-04-24: 23:00:00 650 mg via ORAL
  Filled 2020-04-24: qty 2

## 2020-04-24 MED ORDER — CEPHALEXIN 500 MG PO CAPS: 500.0000 mg | ORAL_CAPSULE | Freq: Four times a day (QID) | ORAL | 0 refills | Status: AC

## 2020-04-24 MED ORDER — ACETAMINOPHEN 325 MG PO TABS: 650.0000 mg | ORAL_TABLET | Freq: Four times a day (QID) | ORAL | 1 refills | Status: DC | PRN

## 2020-04-24 MED ORDER — IOHEXOL 300 MG/ML  SOLN
100.0000 mL | Freq: Once | INTRAMUSCULAR | Status: AC | PRN
  Administered 2020-04-24: 23:00:00 100 mL via INTRAVENOUS

## 2020-04-24 MED ORDER — CEPHALEXIN 250 MG PO CAPS
500.0000 mg | ORAL_CAPSULE | Freq: Once | ORAL | Status: AC
  Administered 2020-04-24: 500 mg via ORAL
  Filled 2020-04-24: qty 2

## 2020-04-24 MED ORDER — LIDOCAINE 5 % EX PTCH
1.0000 | MEDICATED_PATCH | CUTANEOUS | Status: DC
  Administered 2020-04-24: 1 via TRANSDERMAL
  Filled 2020-04-24: qty 1

## 2020-04-24 NOTE — ED Provider Notes (Signed)
[facility] EMERGENCY DEPARTMENT Provider Note   CSN: [id number] Arrival date & time: 04/24/20  2152     History Chief Complaint  Patient presents with  . Fall    Back Pain     Paul [name] is a 86 y.o. male presenting to emergency department with mechanical fall.  The patient reportedly was at home and lost his balance and fell over backwards.  He struck his left back and left flank on a desk [employer], which his daughter says was split in half.  Patient also reports he struck the back of his head.  He did not lose consciousness.  He is on Plavix.  He has not gone up since his injury.  He reports significant pain around his left flank where he has a region of bruising.  He denies any pain or numbness in his hips or his lower extremities.  He denies any pain in his neck.  HPI     Past Medical History:  Diagnosis Date  . Back pain   . Bladder tumor   . BPH (benign prostatic hyperplasia)   . Hyperlipidemia   . Hypertension   . Impaired hearing BILATERAL HEARING AIDS  . Personal history of gastric ulcer     Patient Active Problem List   Diagnosis Date Noted  . Exertional dyspnea 08/28/2015  . Depressive disorder 08/28/2015  . PAD (peripheral artery disease) ([place]) 05/26/2012  . Dyspnea 04/09/2012  . Hypertension 04/09/2012  . Claudication ([place]) 04/09/2012  . Transitional cell carcinoma of bladder ([place]) 01/25/2012    Past Surgical History:  Procedure Laterality Date  . APPENDECTOMY  1957  . CATARACT EXTRACTION W/ INTRAOCULAR LENS  IMPLANT, BILATERAL    . CYSTOSCOPY WITH BIOPSY  03/08/2012   Procedure: CYSTOSCOPY WITH BIOPSY;  Surgeon: Claybon Jabs, MD;  Location: [facility];  Service: Urology;  Laterality: N/A;  gyrus  . TONSILLECTOMY  CHILD  . TRANSURETHRAL INCISION OF PROSTATE  03/08/2012   Procedure: TRANSURETHRAL INCISION OF THE PROSTATE (TUIP);  Surgeon: Claybon Jabs, MD;  Location: [facility];  Service:  Urology;  Laterality: N/A;  . TRANSURETHRAL RESECTION OF BLADDER TUMOR  01/26/2012   Procedure: TRANSURETHRAL RESECTION OF BLADDER TUMOR (TURBT);  Surgeon: Claybon Jabs, MD;  Location: [facility];  Service: Urology;  Laterality: N/A;  GYRUS MYTOMICIN C        Family History  Problem Relation Age of Onset  . Kidney cancer Mother   . Hypertension Father     Social History   Tobacco Use  . Smoking status: Former Smoker    Years: 30.00    Types: Cigarettes    Quit date: 01/20/1981    Years since quitting: 39.2  . Smokeless tobacco: Never Used  Substance Use Topics  . Alcohol use: No  . Drug use: No    Home Medications Prior to Admission medications   Medication Sig Start Date End Date Taking? Authorizing Provider  Aspirin-Acetaminophen (GOODYS BODY PAIN PO) Take by mouth. Pt stated he takes [employer] as needed    [provider]  cilostazol (PLETAL) 50 MG tablet TAKE ONE TABLET BY MOUTH TWICE DAILY 10/06/16   Wellington Hampshire, MD  ezetimibe (ZETIA) 10 MG tablet Take 5 mg by mouth daily.    [provider]  lisinopril (PRINIVIL,ZESTRIL) 20 MG tablet Take 20 mg by mouth daily.  05/21/13   [provider]  Tamsulosin HCl (FLOMAX) 0.4 MG CAPS Take  0.4 mg by mouth daily.    [provider]  simvastatin (ZOCOR) 20 MG tablet Take 20 mg by mouth every evening.  02/27/12  [provider]    Allergies    Livalo [pitavastatin]  Review of Systems   Review of Systems  Constitutional: Negative for chills and fever.  Eyes: Negative for pain and visual disturbance.  Respiratory: Negative for cough and shortness of breath.   Cardiovascular: Negative for chest pain and palpitations.  Gastrointestinal: Negative for abdominal pain and vomiting.  Genitourinary: Negative for dysuria and hematuria.  Musculoskeletal: Positive for arthralgias and myalgias. Negative for neck pain and neck stiffness.  Skin: Negative for color change  and rash.  Neurological: Negative for syncope, light-headedness and headaches.  Psychiatric/Behavioral: Negative for agitation and confusion.  All other systems reviewed and are negative.   Physical Exam Updated Vital Signs BP (!) 210/117 (BP Location: Right Arm)   Pulse 82   Temp 98.5 F (36.9 C) (Oral)   Resp 14   Ht 5\' 11"  (1.803 m)   Wt 75 kg   SpO2 96%   BMI 23.06 kg/m   Physical Exam Vitals and nursing note reviewed.  Constitutional:      Appearance: He is well-developed.  HENT:     Head: Normocephalic and atraumatic.  Eyes:     Conjunctiva/sclera: Conjunctivae normal.  Cardiovascular:     Rate and Rhythm: Normal rate and regular rhythm.     Pulses: Normal pulses.  Pulmonary:     Effort: Pulmonary effort is normal.     Breath sounds: Normal breath sounds.  Abdominal:     Palpations: Abdomen is soft.     Tenderness: There is no abdominal tenderness.  Musculoskeletal:     Cervical back: Neck supple.     Comments: Ecchymoses and tenderness to left flank, hematoma approx size of palm at this location Paraspinal lower back muscular tenderness, R > L No pelvic tenderness or instability Full active and passive ROM at the hips bilaterally  Skin:    General: Skin is warm and dry.  Neurological:     General: No focal deficit present.     Mental Status: He is alert and oriented to person, place, and time.     Comments: No T or C spine tenderness Minor L spine tenderness (also present off midline)  Psychiatric:        Mood and Affect: Mood normal.        Behavior: Behavior normal.     ED Results / Procedures / Treatments   Labs (all labs ordered are listed, but only abnormal results are displayed) Labs Reviewed  CBG MONITORING, ED - Abnormal; Notable for the following components:      Result Value   Glucose-Capillary 129 (*)    All other components within normal limits    EKG EKG Interpretation  Date/Time:  Tuesday Apr 24 2020 22:04:05 EDT Ventricular  Rate:  82 PR Interval:    QRS Duration: 95 QT Interval:  347 QTC Calculation: 406 R Axis:   -39 Text Interpretation: Sinus rhythm Left axis deviation No STEMI Confirmed by Octaviano Glow [phone]) on 04/24/2020 10:05:41 PM   Radiology No results found.  Procedures Procedures (including critical care time)  Medications Ordered in ED Medications - No data to display  ED Course  I have reviewed the triage vital signs and the nursing notes.  Pertinent labs & imaging results that were available during my care of the patient were reviewed by me and  considered in my medical decision making (see chart for details).  86 yo male here after a mechanical fall, with left flank pain and hematoma on exam.  No focal rib tenderness.  Possible L-spine tenderness, although he is having lower back muscular spasms, difficult to determine.  Also struck his head on the ground, no LOC.  Plan for [facility], CT abdomen/pelvis and L-spine imaging.  Neurovascularly intact Doubtful of hip fracture  Will check UA for evidence of blood, cannot exclude possibility of renal contusion given location of injury  Ordered lidoderm patch and tylenol for pain control istat for Cr check  Signed out to overnight EDP with plan to f/u on CT imaging, if benign can consider discharge.  Home PT face-to-face order placed.  Clinical Course as of Apr 24 2302  Tue Apr 24, 2020  2303 Nitrite(!): POSITIVE [MT]    Clinical Course User Index [MT] Langston Masker Carola Rhine, MD    Final Clinical Impression(s) / ED Diagnoses Final diagnoses:  None    Rx / DC Orders ED Discharge Orders    None       Dalaya Suppa, Carola Rhine, MD 04/25/20 [phone]

## 2020-04-24 NOTE — ED Notes (Signed)
CBG Resuslts of 129 reported to [place], Therapist, sports.

## 2020-04-24 NOTE — ED Triage Notes (Signed)
Patient arrived with PTAR from home lost his balance and fell backwards , no LOC , reports left low back pain , alert and oriented x4, respirations unlabored .

## 2020-04-24 NOTE — ED Notes (Signed)
Transported to CT scan

## 2020-04-25 MED ORDER — DOCUSATE SODIUM 100 MG PO CAPS
100.0000 mg | ORAL_CAPSULE | Freq: Two times a day (BID) | ORAL | 0 refills | Status: DC
Start: 2020-04-25 — End: 2021-01-02

## 2020-04-25 MED ORDER — CEPHALEXIN 500 MG PO CAPS
500.0000 mg | ORAL_CAPSULE | Freq: Four times a day (QID) | ORAL | 0 refills | Status: DC
Start: 2020-04-25 — End: 2020-05-31

## 2020-04-25 MED ORDER — IBUPROFEN 400 MG PO TABS
400.0000 mg | ORAL_TABLET | Freq: Three times a day (TID) | ORAL | 0 refills | Status: AC
Start: 2020-04-25 — End: 2020-04-28

## 2020-04-25 MED ORDER — OXYCODONE-ACETAMINOPHEN 5-325 MG PO TABS: 1.0000 | ORAL_TABLET | Freq: Two times a day (BID) | ORAL | 0 refills | Status: DC | PRN

## 2020-04-25 MED ORDER — OXYCODONE-ACETAMINOPHEN 5-325 MG PO TABS
1.0000 | ORAL_TABLET | Freq: Once | ORAL | Status: AC
  Administered 2020-04-25: 1 via ORAL
  Filled 2020-04-25: qty 1

## 2020-04-25 NOTE — Discharge Instructions (Addendum)
Please use the oxycodone only as absolutely necessary preferably only at night when the pain is severe.  Use ibuprofen 3 times a day for the first 3 days and then as needed after that up to 3 times a day.  This can have side effect of ulcers or GI bleeding so if you start having dark stools or blood in your stools please stop it immediately.  Please use incentive spirometer multiple times throughout the day to help keep your lungs inflated.

## 2020-04-25 NOTE — ED Provider Notes (Signed)
1:24 AM Assumed care from Dr. Langston Masker, please see their note for full history, physical and decision making until this point. In brief this is a 86 y.o. year old male who presented to the ED tonight with Fall (Back Pain )     Total discharge pending CT scan to evaluate for traumatic injury after a mechanical fall and found to have 3 rib fractures that explains his pain.  Instructed how to use incentive spirometer.  Careful instruction on when to use narcotic pain medication and to be using stool softeners if taking it regularly.  Also discussed how long it would take to heal.  Found to have an enlarged prostate which has a history of prostate cancer so he will follow-up with his urologist regarding that.  Already started on Keflex by previous provider so prescription given for the same.  Decided to treat for 10 days based on his enlarged prostate in case it was prostatitis.  Discharge instructions, including strict return precautions for new or worsening symptoms, given. Patient and/or family verbalized understanding and agreement with the plan as described.   Labs, studies and imaging reviewed by myself and considered in medical decision making if ordered. Imaging interpreted by radiology.  Labs Reviewed  URINALYSIS, ROUTINE W REFLEX MICROSCOPIC - Abnormal; Notable for the following components:      Result Value   Hgb urine dipstick SMALL (*)    Protein, ur 30 (*)    Nitrite POSITIVE (*)    Leukocytes,Ua TRACE (*)    Bacteria, UA RARE (*)    All other components within normal limits  CBG MONITORING, ED - Abnormal; Notable for the following components:   Glucose-Capillary 129 (*)    All other components within normal limits  I-STAT CHEM 8, ED - Abnormal; Notable for the following components:   Creatinine, Ser 1.30 (*)    Glucose, Bld 127 (*)    All other components within normal limits  URINE CULTURE    CT L-SPINE NO CHARGE  Final Result    CT ABDOMEN PELVIS W CONTRAST  Final Result    CT Head Wo Contrast  Final Result      No follow-ups on file.    Lakhia Gengler, Corene Cornea, MD 04/25/20 [phone]

## 2020-04-26 LAB — URINE CULTURE

## 2020-05-01 DIAGNOSIS — Z8551 Personal history of malignant neoplasm of bladder: Secondary | ICD-10-CM | POA: Diagnosis not present

## 2020-05-01 DIAGNOSIS — R310 Gross hematuria: Secondary | ICD-10-CM | POA: Diagnosis not present

## 2020-05-01 DIAGNOSIS — R8279 Other abnormal findings on microbiological examination of urine: Secondary | ICD-10-CM | POA: Diagnosis not present

## 2020-05-03 DIAGNOSIS — M1990 Unspecified osteoarthritis, unspecified site: Secondary | ICD-10-CM | POA: Diagnosis not present

## 2020-05-03 DIAGNOSIS — F0390 Unspecified dementia without behavioral disturbance: Secondary | ICD-10-CM | POA: Diagnosis not present

## 2020-05-03 DIAGNOSIS — H9190 Unspecified hearing loss, unspecified ear: Secondary | ICD-10-CM | POA: Diagnosis not present

## 2020-05-03 DIAGNOSIS — N39498 Other specified urinary incontinence: Secondary | ICD-10-CM | POA: Diagnosis not present

## 2020-05-03 DIAGNOSIS — N401 Enlarged prostate with lower urinary tract symptoms: Secondary | ICD-10-CM | POA: Diagnosis not present

## 2020-05-03 DIAGNOSIS — F329 Major depressive disorder, single episode, unspecified: Secondary | ICD-10-CM | POA: Diagnosis not present

## 2020-05-03 DIAGNOSIS — M519 Unspecified thoracic, thoracolumbar and lumbosacral intervertebral disc disorder: Secondary | ICD-10-CM | POA: Diagnosis not present

## 2020-05-03 DIAGNOSIS — F4320 Adjustment disorder, unspecified: Secondary | ICD-10-CM | POA: Diagnosis not present

## 2020-05-03 DIAGNOSIS — R1310 Dysphagia, unspecified: Secondary | ICD-10-CM | POA: Diagnosis not present

## 2020-05-03 DIAGNOSIS — N3090 Cystitis, unspecified without hematuria: Secondary | ICD-10-CM | POA: Diagnosis not present

## 2020-05-03 DIAGNOSIS — H539 Unspecified visual disturbance: Secondary | ICD-10-CM | POA: Diagnosis not present

## 2020-05-03 DIAGNOSIS — S301XXD Contusion of abdominal wall, subsequent encounter: Secondary | ICD-10-CM | POA: Diagnosis not present

## 2020-05-03 DIAGNOSIS — Z9181 History of falling: Secondary | ICD-10-CM | POA: Diagnosis not present

## 2020-05-03 DIAGNOSIS — Z87891 Personal history of nicotine dependence: Secondary | ICD-10-CM | POA: Diagnosis not present

## 2020-05-03 DIAGNOSIS — Z8546 Personal history of malignant neoplasm of prostate: Secondary | ICD-10-CM | POA: Diagnosis not present

## 2020-05-03 DIAGNOSIS — J90 Pleural effusion, not elsewhere classified: Secondary | ICD-10-CM | POA: Diagnosis not present

## 2020-05-03 DIAGNOSIS — E785 Hyperlipidemia, unspecified: Secondary | ICD-10-CM | POA: Diagnosis not present

## 2020-05-03 DIAGNOSIS — Z8673 Personal history of transient ischemic attack (TIA), and cerebral infarction without residual deficits: Secondary | ICD-10-CM | POA: Diagnosis not present

## 2020-05-03 DIAGNOSIS — Z8551 Personal history of malignant neoplasm of bladder: Secondary | ICD-10-CM | POA: Diagnosis not present

## 2020-05-03 DIAGNOSIS — I739 Peripheral vascular disease, unspecified: Secondary | ICD-10-CM | POA: Diagnosis not present

## 2020-05-03 DIAGNOSIS — S2242XD Multiple fractures of ribs, left side, subsequent encounter for fracture with routine healing: Secondary | ICD-10-CM | POA: Diagnosis not present

## 2020-05-03 DIAGNOSIS — D126 Benign neoplasm of colon, unspecified: Secondary | ICD-10-CM | POA: Diagnosis not present

## 2020-05-10 DIAGNOSIS — R8279 Other abnormal findings on microbiological examination of urine: Secondary | ICD-10-CM | POA: Diagnosis not present

## 2020-05-15 DIAGNOSIS — Z8551 Personal history of malignant neoplasm of bladder: Secondary | ICD-10-CM | POA: Diagnosis not present

## 2020-05-15 DIAGNOSIS — D126 Benign neoplasm of colon, unspecified: Secondary | ICD-10-CM | POA: Diagnosis not present

## 2020-05-15 DIAGNOSIS — S301XXD Contusion of abdominal wall, subsequent encounter: Secondary | ICD-10-CM | POA: Diagnosis not present

## 2020-05-15 DIAGNOSIS — N3090 Cystitis, unspecified without hematuria: Secondary | ICD-10-CM | POA: Diagnosis not present

## 2020-05-15 DIAGNOSIS — I739 Peripheral vascular disease, unspecified: Secondary | ICD-10-CM | POA: Diagnosis not present

## 2020-05-15 DIAGNOSIS — H9190 Unspecified hearing loss, unspecified ear: Secondary | ICD-10-CM | POA: Diagnosis not present

## 2020-05-15 DIAGNOSIS — E785 Hyperlipidemia, unspecified: Secondary | ICD-10-CM | POA: Diagnosis not present

## 2020-05-15 DIAGNOSIS — N401 Enlarged prostate with lower urinary tract symptoms: Secondary | ICD-10-CM | POA: Diagnosis not present

## 2020-05-15 DIAGNOSIS — M519 Unspecified thoracic, thoracolumbar and lumbosacral intervertebral disc disorder: Secondary | ICD-10-CM | POA: Diagnosis not present

## 2020-05-15 DIAGNOSIS — Z87891 Personal history of nicotine dependence: Secondary | ICD-10-CM | POA: Diagnosis not present

## 2020-05-15 DIAGNOSIS — N39498 Other specified urinary incontinence: Secondary | ICD-10-CM | POA: Diagnosis not present

## 2020-05-15 DIAGNOSIS — M1990 Unspecified osteoarthritis, unspecified site: Secondary | ICD-10-CM | POA: Diagnosis not present

## 2020-05-15 DIAGNOSIS — R1310 Dysphagia, unspecified: Secondary | ICD-10-CM | POA: Diagnosis not present

## 2020-05-15 DIAGNOSIS — F329 Major depressive disorder, single episode, unspecified: Secondary | ICD-10-CM | POA: Diagnosis not present

## 2020-05-15 DIAGNOSIS — Z9181 History of falling: Secondary | ICD-10-CM | POA: Diagnosis not present

## 2020-05-15 DIAGNOSIS — F4320 Adjustment disorder, unspecified: Secondary | ICD-10-CM | POA: Diagnosis not present

## 2020-05-15 DIAGNOSIS — J90 Pleural effusion, not elsewhere classified: Secondary | ICD-10-CM | POA: Diagnosis not present

## 2020-05-15 DIAGNOSIS — H539 Unspecified visual disturbance: Secondary | ICD-10-CM | POA: Diagnosis not present

## 2020-05-15 DIAGNOSIS — Z8546 Personal history of malignant neoplasm of prostate: Secondary | ICD-10-CM | POA: Diagnosis not present

## 2020-05-15 DIAGNOSIS — S2242XD Multiple fractures of ribs, left side, subsequent encounter for fracture with routine healing: Secondary | ICD-10-CM | POA: Diagnosis not present

## 2020-05-15 DIAGNOSIS — F0390 Unspecified dementia without behavioral disturbance: Secondary | ICD-10-CM | POA: Diagnosis not present

## 2020-05-15 DIAGNOSIS — Z8673 Personal history of transient ischemic attack (TIA), and cerebral infarction without residual deficits: Secondary | ICD-10-CM | POA: Diagnosis not present

## 2020-05-16 DIAGNOSIS — F4320 Adjustment disorder, unspecified: Secondary | ICD-10-CM | POA: Diagnosis not present

## 2020-05-16 DIAGNOSIS — S2242XD Multiple fractures of ribs, left side, subsequent encounter for fracture with routine healing: Secondary | ICD-10-CM | POA: Diagnosis not present

## 2020-05-16 DIAGNOSIS — M519 Unspecified thoracic, thoracolumbar and lumbosacral intervertebral disc disorder: Secondary | ICD-10-CM | POA: Diagnosis not present

## 2020-05-16 DIAGNOSIS — E785 Hyperlipidemia, unspecified: Secondary | ICD-10-CM | POA: Diagnosis not present

## 2020-05-16 DIAGNOSIS — J90 Pleural effusion, not elsewhere classified: Secondary | ICD-10-CM | POA: Diagnosis not present

## 2020-05-16 DIAGNOSIS — Z9181 History of falling: Secondary | ICD-10-CM | POA: Diagnosis not present

## 2020-05-16 DIAGNOSIS — N3090 Cystitis, unspecified without hematuria: Secondary | ICD-10-CM | POA: Diagnosis not present

## 2020-05-16 DIAGNOSIS — Z8551 Personal history of malignant neoplasm of bladder: Secondary | ICD-10-CM | POA: Diagnosis not present

## 2020-05-16 DIAGNOSIS — N401 Enlarged prostate with lower urinary tract symptoms: Secondary | ICD-10-CM | POA: Diagnosis not present

## 2020-05-16 DIAGNOSIS — Z8546 Personal history of malignant neoplasm of prostate: Secondary | ICD-10-CM | POA: Diagnosis not present

## 2020-05-16 DIAGNOSIS — N39498 Other specified urinary incontinence: Secondary | ICD-10-CM | POA: Diagnosis not present

## 2020-05-16 DIAGNOSIS — M1990 Unspecified osteoarthritis, unspecified site: Secondary | ICD-10-CM | POA: Diagnosis not present

## 2020-05-16 DIAGNOSIS — R1310 Dysphagia, unspecified: Secondary | ICD-10-CM | POA: Diagnosis not present

## 2020-05-16 DIAGNOSIS — H9190 Unspecified hearing loss, unspecified ear: Secondary | ICD-10-CM | POA: Diagnosis not present

## 2020-05-16 DIAGNOSIS — I739 Peripheral vascular disease, unspecified: Secondary | ICD-10-CM | POA: Diagnosis not present

## 2020-05-16 DIAGNOSIS — F0390 Unspecified dementia without behavioral disturbance: Secondary | ICD-10-CM | POA: Diagnosis not present

## 2020-05-16 DIAGNOSIS — S301XXD Contusion of abdominal wall, subsequent encounter: Secondary | ICD-10-CM | POA: Diagnosis not present

## 2020-05-16 DIAGNOSIS — H539 Unspecified visual disturbance: Secondary | ICD-10-CM | POA: Diagnosis not present

## 2020-05-16 DIAGNOSIS — Z8673 Personal history of transient ischemic attack (TIA), and cerebral infarction without residual deficits: Secondary | ICD-10-CM | POA: Diagnosis not present

## 2020-05-16 DIAGNOSIS — Z87891 Personal history of nicotine dependence: Secondary | ICD-10-CM | POA: Diagnosis not present

## 2020-05-16 DIAGNOSIS — F329 Major depressive disorder, single episode, unspecified: Secondary | ICD-10-CM | POA: Diagnosis not present

## 2020-05-16 DIAGNOSIS — D126 Benign neoplasm of colon, unspecified: Secondary | ICD-10-CM | POA: Diagnosis not present

## 2020-05-31 ENCOUNTER — Other Ambulatory Visit: Payer: Self-pay

## 2020-05-31 ENCOUNTER — Ambulatory Visit: Payer: PPO | Admitting: Podiatry

## 2020-05-31 ENCOUNTER — Encounter: Payer: Self-pay | Admitting: Podiatry

## 2020-05-31 VITALS — Temp 97.6°F

## 2020-05-31 DIAGNOSIS — M2041 Other hammer toe(s) (acquired), right foot: Secondary | ICD-10-CM | POA: Diagnosis not present

## 2020-05-31 DIAGNOSIS — M79674 Pain in right toe(s): Secondary | ICD-10-CM

## 2020-05-31 DIAGNOSIS — M79675 Pain in left toe(s): Secondary | ICD-10-CM | POA: Diagnosis not present

## 2020-05-31 DIAGNOSIS — B351 Tinea unguium: Secondary | ICD-10-CM

## 2020-05-31 DIAGNOSIS — L84 Corns and callosities: Secondary | ICD-10-CM | POA: Diagnosis not present

## 2020-05-31 NOTE — Progress Notes (Signed)
   Subjective:    Patient ID: Paul [name], male    DOB: 11-Nov-1930, 86 y.o.   MRN: [id number]  HPI    Review of Systems  All other systems reviewed and are negative.      Objective:   Physical Exam        Assessment & Plan:

## 2020-06-01 NOTE — Progress Notes (Signed)
Subjective:   Patient ID: Paul [name], male   DOB: 86 y.o.   MRN: [id number]   HPI Patient presents with caregiver with chronic nail disease 1-5 both feet that are very thickened and they cannot cut and lesion fifth digit right that is painful when pressed and make shoe gear difficult.  Patient's family states that he is not in good health and that they cannot handle this condition for him and he does not smoke and is not active   Review of Systems  All other systems reviewed and are negative.       Objective:  Physical Exam Vitals and nursing note reviewed.  Constitutional:      Appearance: He is well-developed.  Pulmonary:     Effort: Pulmonary effort is normal.  Musculoskeletal:        General: Normal range of motion.  Skin:    General: Skin is warm.  Neurological:     Mental Status: He is alert.     Neurovascular status was found to be diminished with diminished pulses PT DP bilateral and diminished range of motion of the subtalar midtarsal joint along with diminished muscle strength.  Patient is using a walker has severely thickened nailbeds 1-5 both feet that are painful when pressed and make shoe gear painful.  Also has keratotic lesion digit 5 right with moderate rotation of the toe that is painful when palpated and has good digital perfusion      Assessment:  At risk patient with vascular disease with mycotic nail infection 1-5 both feet painful hammertoe deformity fifth right with rotation of the toe and keratotic lesion formation     Plan:  H&P reviewed condition with family.  Today aggressive debridement accomplished carefully taking out the corners and lesion debridement accomplished fifth digit.  I educated them on shoe gear modifications and patient will be seen back by physician for regular care every 3 months and is encouraged to call with concerns

## 2020-06-04 DIAGNOSIS — N39498 Other specified urinary incontinence: Secondary | ICD-10-CM | POA: Diagnosis not present

## 2020-06-04 DIAGNOSIS — F4320 Adjustment disorder, unspecified: Secondary | ICD-10-CM | POA: Diagnosis not present

## 2020-06-04 DIAGNOSIS — H9190 Unspecified hearing loss, unspecified ear: Secondary | ICD-10-CM | POA: Diagnosis not present

## 2020-06-04 DIAGNOSIS — R1310 Dysphagia, unspecified: Secondary | ICD-10-CM | POA: Diagnosis not present

## 2020-06-04 DIAGNOSIS — Z8673 Personal history of transient ischemic attack (TIA), and cerebral infarction without residual deficits: Secondary | ICD-10-CM | POA: Diagnosis not present

## 2020-06-04 DIAGNOSIS — N3090 Cystitis, unspecified without hematuria: Secondary | ICD-10-CM | POA: Diagnosis not present

## 2020-06-04 DIAGNOSIS — Z87891 Personal history of nicotine dependence: Secondary | ICD-10-CM | POA: Diagnosis not present

## 2020-06-04 DIAGNOSIS — E785 Hyperlipidemia, unspecified: Secondary | ICD-10-CM | POA: Diagnosis not present

## 2020-06-04 DIAGNOSIS — Z9181 History of falling: Secondary | ICD-10-CM | POA: Diagnosis not present

## 2020-06-04 DIAGNOSIS — N401 Enlarged prostate with lower urinary tract symptoms: Secondary | ICD-10-CM | POA: Diagnosis not present

## 2020-06-04 DIAGNOSIS — M519 Unspecified thoracic, thoracolumbar and lumbosacral intervertebral disc disorder: Secondary | ICD-10-CM | POA: Diagnosis not present

## 2020-06-04 DIAGNOSIS — M1990 Unspecified osteoarthritis, unspecified site: Secondary | ICD-10-CM | POA: Diagnosis not present

## 2020-06-04 DIAGNOSIS — Z8551 Personal history of malignant neoplasm of bladder: Secondary | ICD-10-CM | POA: Diagnosis not present

## 2020-06-04 DIAGNOSIS — Z8546 Personal history of malignant neoplasm of prostate: Secondary | ICD-10-CM | POA: Diagnosis not present

## 2020-06-04 DIAGNOSIS — J90 Pleural effusion, not elsewhere classified: Secondary | ICD-10-CM | POA: Diagnosis not present

## 2020-06-04 DIAGNOSIS — I739 Peripheral vascular disease, unspecified: Secondary | ICD-10-CM | POA: Diagnosis not present

## 2020-06-04 DIAGNOSIS — F329 Major depressive disorder, single episode, unspecified: Secondary | ICD-10-CM | POA: Diagnosis not present

## 2020-06-04 DIAGNOSIS — D126 Benign neoplasm of colon, unspecified: Secondary | ICD-10-CM | POA: Diagnosis not present

## 2020-06-04 DIAGNOSIS — S2242XD Multiple fractures of ribs, left side, subsequent encounter for fracture with routine healing: Secondary | ICD-10-CM | POA: Diagnosis not present

## 2020-06-04 DIAGNOSIS — H539 Unspecified visual disturbance: Secondary | ICD-10-CM | POA: Diagnosis not present

## 2020-06-04 DIAGNOSIS — S301XXD Contusion of abdominal wall, subsequent encounter: Secondary | ICD-10-CM | POA: Diagnosis not present

## 2020-06-04 DIAGNOSIS — F0390 Unspecified dementia without behavioral disturbance: Secondary | ICD-10-CM | POA: Diagnosis not present

## 2020-06-27 DIAGNOSIS — H539 Unspecified visual disturbance: Secondary | ICD-10-CM | POA: Diagnosis not present

## 2020-06-27 DIAGNOSIS — N3090 Cystitis, unspecified without hematuria: Secondary | ICD-10-CM | POA: Diagnosis not present

## 2020-06-27 DIAGNOSIS — Z8673 Personal history of transient ischemic attack (TIA), and cerebral infarction without residual deficits: Secondary | ICD-10-CM | POA: Diagnosis not present

## 2020-06-27 DIAGNOSIS — I739 Peripheral vascular disease, unspecified: Secondary | ICD-10-CM | POA: Diagnosis not present

## 2020-06-27 DIAGNOSIS — M1990 Unspecified osteoarthritis, unspecified site: Secondary | ICD-10-CM | POA: Diagnosis not present

## 2020-06-27 DIAGNOSIS — F0390 Unspecified dementia without behavioral disturbance: Secondary | ICD-10-CM | POA: Diagnosis not present

## 2020-06-27 DIAGNOSIS — J90 Pleural effusion, not elsewhere classified: Secondary | ICD-10-CM | POA: Diagnosis not present

## 2020-06-27 DIAGNOSIS — F4320 Adjustment disorder, unspecified: Secondary | ICD-10-CM | POA: Diagnosis not present

## 2020-06-27 DIAGNOSIS — S301XXD Contusion of abdominal wall, subsequent encounter: Secondary | ICD-10-CM | POA: Diagnosis not present

## 2020-06-27 DIAGNOSIS — Z87891 Personal history of nicotine dependence: Secondary | ICD-10-CM | POA: Diagnosis not present

## 2020-06-27 DIAGNOSIS — D126 Benign neoplasm of colon, unspecified: Secondary | ICD-10-CM | POA: Diagnosis not present

## 2020-06-27 DIAGNOSIS — M519 Unspecified thoracic, thoracolumbar and lumbosacral intervertebral disc disorder: Secondary | ICD-10-CM | POA: Diagnosis not present

## 2020-06-27 DIAGNOSIS — H9190 Unspecified hearing loss, unspecified ear: Secondary | ICD-10-CM | POA: Diagnosis not present

## 2020-06-27 DIAGNOSIS — Z8551 Personal history of malignant neoplasm of bladder: Secondary | ICD-10-CM | POA: Diagnosis not present

## 2020-06-27 DIAGNOSIS — F329 Major depressive disorder, single episode, unspecified: Secondary | ICD-10-CM | POA: Diagnosis not present

## 2020-06-27 DIAGNOSIS — Z9181 History of falling: Secondary | ICD-10-CM | POA: Diagnosis not present

## 2020-06-27 DIAGNOSIS — R1310 Dysphagia, unspecified: Secondary | ICD-10-CM | POA: Diagnosis not present

## 2020-06-27 DIAGNOSIS — N39498 Other specified urinary incontinence: Secondary | ICD-10-CM | POA: Diagnosis not present

## 2020-06-27 DIAGNOSIS — N401 Enlarged prostate with lower urinary tract symptoms: Secondary | ICD-10-CM | POA: Diagnosis not present

## 2020-06-27 DIAGNOSIS — S2242XD Multiple fractures of ribs, left side, subsequent encounter for fracture with routine healing: Secondary | ICD-10-CM | POA: Diagnosis not present

## 2020-06-27 DIAGNOSIS — Z8546 Personal history of malignant neoplasm of prostate: Secondary | ICD-10-CM | POA: Diagnosis not present

## 2020-06-27 DIAGNOSIS — E785 Hyperlipidemia, unspecified: Secondary | ICD-10-CM | POA: Diagnosis not present

## 2020-06-29 ENCOUNTER — Emergency Department (HOSPITAL_COMMUNITY)
Admission: EM | Admit: 2020-06-29 | Discharge: 2020-06-29 | Disposition: A | Discharge status: Home / Self Care | Payer: PPO | Attending: Emergency Medicine | Admitting: Emergency Medicine

## 2020-06-29 ENCOUNTER — Emergency Department (HOSPITAL_COMMUNITY): Payer: PPO

## 2020-06-29 DIAGNOSIS — Y939 Activity, unspecified: Secondary | ICD-10-CM | POA: Diagnosis not present

## 2020-06-29 DIAGNOSIS — S0990XA Unspecified injury of head, initial encounter: Secondary | ICD-10-CM | POA: Diagnosis present

## 2020-06-29 DIAGNOSIS — R001 Bradycardia, unspecified: Secondary | ICD-10-CM | POA: Diagnosis not present

## 2020-06-29 DIAGNOSIS — Y999 Unspecified external cause status: Secondary | ICD-10-CM | POA: Diagnosis not present

## 2020-06-29 DIAGNOSIS — Y92009 Unspecified place in unspecified non-institutional (private) residence as the place of occurrence of the external cause: Secondary | ICD-10-CM | POA: Insufficient documentation

## 2020-06-29 DIAGNOSIS — I10 Essential (primary) hypertension: Secondary | ICD-10-CM | POA: Diagnosis not present

## 2020-06-29 DIAGNOSIS — W19XXXA Unspecified fall, initial encounter: Secondary | ICD-10-CM | POA: Diagnosis not present

## 2020-06-29 DIAGNOSIS — W133XXA Fall through floor, initial encounter: Secondary | ICD-10-CM | POA: Insufficient documentation

## 2020-06-29 DIAGNOSIS — S0083XA Contusion of other part of head, initial encounter: Secondary | ICD-10-CM

## 2020-06-29 DIAGNOSIS — M7981 Nontraumatic hematoma of soft tissue: Secondary | ICD-10-CM | POA: Diagnosis not present

## 2020-06-29 DIAGNOSIS — R0902 Hypoxemia: Secondary | ICD-10-CM | POA: Diagnosis not present

## 2020-06-29 DIAGNOSIS — S80211A Abrasion, right knee, initial encounter: Secondary | ICD-10-CM | POA: Diagnosis not present

## 2020-06-29 NOTE — ED Triage Notes (Signed)
Pt arrived via [facility] from home s/p mechanical fall. Pt states he is suppose to use walker with all ambulation. Pt states he was not using his walker and attempted to walk onto back board falling and hitting head only. Pt denies LOC. Small hematoma above R eyebrow. A & O on arrival, 18g R AC

## 2020-06-29 NOTE — Discharge Instructions (Addendum)
Make sure you are using your walker at all times.  If you start having confusion, trouble walking or other complaints return to the ER

## 2020-06-29 NOTE — ED Notes (Signed)
Pt ambulated independently with steady gait using a walker.

## 2020-06-29 NOTE — ED Notes (Signed)
Pt in CT at this time.

## 2020-06-29 NOTE — ED Provider Notes (Signed)
[facility] EMERGENCY DEPARTMENT Provider Note   CSN: [id number] Arrival date & time: 06/29/20  1934     History Chief Complaint  Patient presents with  . Fall    Paul [name] is a 86 y.o. male.  Patient is an 86 year old male with a history of hypertension, hyperlipidemia, PAD on Plavix who presents today as a level two trauma after a fall with head injury. Patient reports that he was walking in from the porch and was not using his walker when his legs gave out causing him to fall forward and hit his face on the floor. He remembers the fall and injury and denies any loss of consciousness. Patient reports that he has having some tenderness over the right eyebrow where he hit his head but denies vision changes, neck pain. He has no pain in his chest, abdomen or upper or lower extremities. Patient reports earlier today he was in his normal state of health. He has been eating and drinking normally. He denies any dizziness or palpitations before his fall.  The history is provided by the patient and the EMS personnel.  Fall This is a new problem. The current episode started 1 to 2 hours ago. The problem occurs constantly. The problem has not changed since onset.Associated symptoms include headaches. Pertinent negatives include no chest pain, no abdominal pain and no shortness of breath. Nothing aggravates the symptoms. Nothing relieves the symptoms. He has tried nothing for the symptoms.       No past medical history on file.  There are no problems to display for this patient.      No family history on file.  Social History   Tobacco Use  . Smoking status: Not on file  Substance Use Topics  . Alcohol use: Not on file  . Drug use: Not on file    Home Medications Prior to Admission medications   Not on File    Allergies    Patient has no allergy information on record.  Review of Systems   Review of Systems  Respiratory: Negative for shortness of  breath.   Cardiovascular: Negative for chest pain.  Gastrointestinal: Negative for abdominal pain.  Neurological: Positive for headaches.  All other systems reviewed and are negative.   Physical Exam Updated Vital Signs BP (!) 194/102   Pulse 77   Temp 98.6 F (37 C) (Oral)   Resp 18   Ht 5\' 8"  (1.727 m)   Wt 65.8 kg   SpO2 97%   BMI 22.05 kg/m   Physical Exam Vitals and nursing note reviewed.  Constitutional:      General: He is not in acute distress.    Appearance: He is well-developed and normal weight.  HENT:     Head: Normocephalic. Contusion present.   Eyes:     Conjunctiva/sclera: Conjunctivae normal.     Pupils: Pupils are equal, round, and reactive to light.  Cardiovascular:     Rate and Rhythm: Normal rate and regular rhythm.     Heart sounds: No murmur heard.   Pulmonary:     Effort: Pulmonary effort is normal. No respiratory distress.     Breath sounds: Normal breath sounds. No wheezing or rales.  Abdominal:     General: There is no distension.     Palpations: Abdomen is soft.     Tenderness: There is no abdominal tenderness. There is no guarding or rebound.  Musculoskeletal:        General: No tenderness. Normal  range of motion.     Cervical back: Normal range of motion and neck supple. No spinous process tenderness or muscular tenderness.     Comments: Minimal superficial abrasion over the right patella but no knee pain and full rom of the knee  Skin:    General: Skin is warm and dry.     Findings: No erythema or rash.  Neurological:     General: No focal deficit present.     Mental Status: He is alert and oriented to person, place, and time. Mental status is at baseline.  Psychiatric:        Mood and Affect: Mood normal.        Behavior: Behavior normal.        Thought Content: Thought content normal.     ED Results / Procedures / Treatments   Labs (all labs ordered are listed, but only abnormal results are displayed) Labs Reviewed - No  data to display  EKG None  Radiology CT HEAD WO CONTRAST  Result Date: 06/29/2020 CLINICAL DATA:  Mechanical fall EXAM: CT HEAD WITHOUT CONTRAST TECHNIQUE: Contiguous axial images were obtained from the base of the skull through the vertex without intravenous contrast. COMPARISON:  None. FINDINGS: Brain: No evidence of acute territorial infarction, hemorrhage, hydrocephalus,extra-axial collection or mass lesion/mass effect. There is dilatation the ventricles and sulci consistent with age-related atrophy. Low-attenuation changes in the deep white matter consistent with small vessel ischemia. Vascular: No hyperdense vessel or unexpected calcification. Skull: The skull is intact. No fracture or focal lesion identified. Sinuses/Orbits: The visualized paranasal sinuses and mastoid air cells are clear. The orbits and globes intact. Other: Small soft tissue hematoma seen overlying the right frontal skull. IMPRESSION: No acute intracranial abnormality. Findings consistent with age related atrophy and chronic small vessel ischemia Small soft tissue hematoma overlying the right frontal skull. Electronically Signed   By: Prudencio Pair M.D.   On: 06/29/2020 20:14    Procedures Procedures (including critical care time)  Medications Ordered in ED Medications - No data to display  ED Course  I have reviewed the triage vital signs and the nursing notes.  Pertinent labs & imaging results that were available during my care of the patient were reviewed by me and considered in my medical decision making (see chart for details).    MDM Rules/Calculators/A&P                         Elderly male presenting from home today after a fall at home when he was not using his walker.  He denies unilateral weakness or numbness.  He has no evidence of stroke on exam but is hypertensive here.  Patient did have an injury to the right eyebrow and is on Plavix for PAD.  He denies any loss of consciousness and denies symptoms  concerning for respiratory or cardiac cause for his fall.  No symptoms to suggest syncope.  Patient has no neck pain or other signs of injury.  Will do head CT to rule out hemorrhage.  9:09 PM CT neg for intracranial injury.  Pt was able to ambulate with a walker and daughter was present and agrees he is at his baseline.  MDM Number of Diagnoses or Management Options   Amount and/or Complexity of Data Reviewed Tests in the radiology section of CPT: ordered and reviewed Independent visualization of images, tracings, or specimens: yes  Risk of Complications, Morbidity, and/or Mortality Presenting problems: moderate Diagnostic procedures: low  Management options: low  Patient Progress Patient progress: stable    Final Clinical Impression(s) / ED Diagnoses Final diagnoses:  Fall, initial encounter  Traumatic hematoma of forehead, initial encounter    Rx / DC Orders ED Discharge Orders    None       Blanchie Dessert, MD 06/29/20 2112

## 2020-06-29 NOTE — ED Notes (Signed)
Pt returned from CT, family at bedside.

## 2020-09-05 ENCOUNTER — Ambulatory Visit: Payer: PPO | Admitting: Podiatry

## 2020-09-14 DIAGNOSIS — E785 Hyperlipidemia, unspecified: Secondary | ICD-10-CM | POA: Diagnosis not present

## 2020-09-14 DIAGNOSIS — E538 Deficiency of other specified B group vitamins: Secondary | ICD-10-CM | POA: Diagnosis not present

## 2020-09-14 DIAGNOSIS — E291 Testicular hypofunction: Secondary | ICD-10-CM | POA: Diagnosis not present

## 2020-09-19 DIAGNOSIS — I10 Essential (primary) hypertension: Secondary | ICD-10-CM | POA: Diagnosis not present

## 2020-09-19 DIAGNOSIS — R809 Proteinuria, unspecified: Secondary | ICD-10-CM | POA: Diagnosis not present

## 2020-09-19 DIAGNOSIS — C679 Malignant neoplasm of bladder, unspecified: Secondary | ICD-10-CM | POA: Diagnosis not present

## 2020-09-19 DIAGNOSIS — Z1331 Encounter for screening for depression: Secondary | ICD-10-CM | POA: Diagnosis not present

## 2020-09-19 DIAGNOSIS — Z0000 Encounter for general adult medical examination without abnormal findings: Secondary | ICD-10-CM | POA: Diagnosis not present

## 2020-09-19 DIAGNOSIS — R0609 Other forms of dyspnea: Secondary | ICD-10-CM | POA: Diagnosis not present

## 2020-09-19 DIAGNOSIS — D692 Other nonthrombocytopenic purpura: Secondary | ICD-10-CM | POA: Diagnosis not present

## 2020-09-19 DIAGNOSIS — R7301 Impaired fasting glucose: Secondary | ICD-10-CM | POA: Diagnosis not present

## 2020-09-19 DIAGNOSIS — I739 Peripheral vascular disease, unspecified: Secondary | ICD-10-CM | POA: Diagnosis not present

## 2020-09-19 DIAGNOSIS — F0390 Unspecified dementia without behavioral disturbance: Secondary | ICD-10-CM | POA: Diagnosis not present

## 2020-09-19 DIAGNOSIS — R2689 Other abnormalities of gait and mobility: Secondary | ICD-10-CM | POA: Diagnosis not present

## 2020-09-19 DIAGNOSIS — I639 Cerebral infarction, unspecified: Secondary | ICD-10-CM | POA: Diagnosis not present

## 2020-09-22 DIAGNOSIS — Z23 Encounter for immunization: Secondary | ICD-10-CM | POA: Diagnosis not present

## 2020-12-26 ENCOUNTER — Emergency Department (HOSPITAL_COMMUNITY): Payer: PPO

## 2020-12-26 ENCOUNTER — Other Ambulatory Visit: Payer: Self-pay

## 2020-12-26 ENCOUNTER — Emergency Department (HOSPITAL_COMMUNITY)
Admission: EM | Admit: 2020-12-26 | Discharge: 2020-12-26 | Disposition: A | Discharge status: Home / Self Care | Payer: PPO | Attending: Emergency Medicine | Admitting: Emergency Medicine

## 2020-12-26 DIAGNOSIS — R0781 Pleurodynia: Secondary | ICD-10-CM | POA: Insufficient documentation

## 2020-12-26 DIAGNOSIS — Z5321 Procedure and treatment not carried out due to patient leaving prior to being seen by health care provider: Secondary | ICD-10-CM | POA: Insufficient documentation

## 2020-12-26 DIAGNOSIS — R1111 Vomiting without nausea: Secondary | ICD-10-CM | POA: Diagnosis not present

## 2020-12-26 DIAGNOSIS — J90 Pleural effusion, not elsewhere classified: Secondary | ICD-10-CM | POA: Diagnosis not present

## 2020-12-26 DIAGNOSIS — R0789 Other chest pain: Secondary | ICD-10-CM | POA: Diagnosis not present

## 2020-12-26 DIAGNOSIS — R079 Chest pain, unspecified: Secondary | ICD-10-CM | POA: Diagnosis not present

## 2020-12-26 DIAGNOSIS — R0902 Hypoxemia: Secondary | ICD-10-CM | POA: Diagnosis not present

## 2020-12-26 LAB — BASIC METABOLIC PANEL
Anion gap: 10 (ref 5–15)
BUN: 17 mg/dL (ref 8–23)
CO2: 25 mmol/L (ref 22–32)
Calcium: 9.2 mg/dL (ref 8.9–10.3)
Chloride: 104 mmol/L (ref 98–111)
Creatinine, Ser: 1.11 mg/dL (ref 0.61–1.24)
GFR, Estimated: >60 mL/min (ref >60)
Glucose, Bld: 119 mg/dL — ABNORMAL HIGH (ref 70–99)
Potassium: 4.3 mmol/L (ref 3.5–5.1)
Sodium: 139 mmol/L (ref 135–145)

## 2020-12-26 LAB — CBC
HCT: 44 % (ref 39.0–52.0)
Hemoglobin: 13.8 g/dL (ref 13.0–17.0)
MCH: 28.8 pg (ref 26.0–34.0)
MCHC: 31.4 g/dL (ref 30.0–36.0)
MCV: 91.7 fL (ref 80.0–100.0)
Platelets: 356 10*3/uL (ref 150–400)
RBC: 4.8 MIL/uL (ref 4.22–5.81)
RDW: 13.3 % (ref 11.5–15.5)
WBC: 11.6 10*3/uL — ABNORMAL HIGH (ref 4.0–10.5)
nRBC: 0 % (ref 0.0–0.2)

## 2020-12-26 LAB — TROPONIN I (HIGH SENSITIVITY): Troponin I (High Sensitivity): 5 ng/L (ref <18)

## 2020-12-26 NOTE — ED Notes (Signed)
Pts daughter arrived to pick pt up. Pt does not want to stay in waiting room.

## 2020-12-26 NOTE — ED Triage Notes (Signed)
Pt reports L sided rib cage pain that he noticed after he woke up this morning. Pain worse with inspiration and movement. 1 nitro and 324 ASA given by EMS. Denies dizziness/shob, although endorses nausea at the onset of his pain but denies at present.

## 2020-12-28 DIAGNOSIS — J189 Pneumonia, unspecified organism: Secondary | ICD-10-CM | POA: Diagnosis not present

## 2020-12-28 DIAGNOSIS — R0789 Other chest pain: Secondary | ICD-10-CM | POA: Diagnosis not present

## 2020-12-28 DIAGNOSIS — H6123 Impacted cerumen, bilateral: Secondary | ICD-10-CM | POA: Diagnosis not present

## 2020-12-28 DIAGNOSIS — I10 Essential (primary) hypertension: Secondary | ICD-10-CM | POA: Diagnosis not present

## 2020-12-28 DIAGNOSIS — E785 Hyperlipidemia, unspecified: Secondary | ICD-10-CM | POA: Diagnosis not present

## 2020-12-28 DIAGNOSIS — F329 Major depressive disorder, single episode, unspecified: Secondary | ICD-10-CM | POA: Diagnosis not present

## 2021-01-02 ENCOUNTER — Encounter: Payer: Self-pay | Admitting: Cardiovascular Disease

## 2021-01-02 ENCOUNTER — Ambulatory Visit: Payer: PPO | Admitting: Cardiovascular Disease

## 2021-01-02 ENCOUNTER — Telehealth: Payer: Self-pay | Admitting: Cardiovascular Disease

## 2021-01-02 ENCOUNTER — Other Ambulatory Visit: Payer: Self-pay

## 2021-01-02 VITALS — BP 110/52 | HR 79 | Ht 68.0 in | Wt 152.0 lb

## 2021-01-02 DIAGNOSIS — I739 Peripheral vascular disease, unspecified: Secondary | ICD-10-CM | POA: Diagnosis not present

## 2021-01-02 DIAGNOSIS — I10 Essential (primary) hypertension: Secondary | ICD-10-CM | POA: Diagnosis not present

## 2021-01-02 DIAGNOSIS — E785 Hyperlipidemia, unspecified: Secondary | ICD-10-CM | POA: Diagnosis not present

## 2021-01-02 DIAGNOSIS — R0789 Other chest pain: Secondary | ICD-10-CM

## 2021-01-02 NOTE — Telephone Encounter (Signed)
lmov to schedule appt per  Per Dr Joylene Draft: STAT to dr. Fletcher Anon or his PA next week. had an er visit for CP  2 days ago. i am calling it unstable angina. i have added imdur. he is elderly and we will not be "too" aggressive but need him seen next week

## 2021-01-02 NOTE — Patient Instructions (Signed)
Medication Instructions:  Your physician recommends that you continue on your current medications as directed. Please refer to the Current Medication list given to you today.  *If you need a refill on your cardiac medications before your next appointment, please call your pharmacy*   Lab Work: None ordered If you have labs (blood work) drawn today and your tests are completely normal, you will receive your results only by: . MyChart Message (if you have MyChart) OR . A paper copy in the mail If you have any lab test that is abnormal or we need to change your treatment, we will call you to review the results.   Testing/Procedures: None ordered   Follow-Up: At [facility], you and your health needs are our priority.  As part of our continuing mission to provide you with exceptional heart care, we have created designated Provider Care Teams.  These Care Teams include your primary Cardiologist (physician) and Advanced Practice Providers (APPs -  Physician Assistants and Nurse Practitioners) who all work together to provide you with the care you need, when you need it.  We recommend signing up for the patient portal called "MyChart".  Sign up information is provided on this After Visit Summary.  MyChart is used to connect with patients for Virtual Visits (Telemedicine).  Patients are able to view lab/test results, encounter notes, upcoming appointments, etc.  Non-urgent messages can be sent to your provider as well.   To learn more about what you can do with MyChart, go to [email or web address]://[email or web address].com.    Your next appointment:   As needed   The format for your next appointment:   In Person  Provider:   You may see Muhammad Arida, MD or one of the following Advanced Practice Providers on your designated Care Team:    Christopher Berge, NP  Ryan Dunn, PA-C  Jacquelyn Visser, PA-C  Cadence Furth, PA-C  Caitlin Walker, NP    Other Instructions N/A  

## 2021-01-02 NOTE — Progress Notes (Signed)
Cardiology Office Note   Date:  01/02/2021   ID:  Paul [name], DOB Nov 28, 1930, MRN [id number]  PCP:  Crist Infante, MD  Cardiologist:   Kathlyn Sacramento, MD   Chief Complaint  Patient presents with  . New Patient (Initial Visit)    Referred by Dr. Joylene Draft for chest pain. Patient states he has swelling in ankles from time to time.  Meds reviewed verbally with patient.       History of Present Illness: Paul [name] is a 87 y.o. male who was referred by Dr. Haynes Kerns for evaluation of chest pain. The patient was seen by me in the past most recently in 2016. Previous echocardiogram and treadmill stress test were unremarkable in 2013. He was diagnosed with peripheral arterial disease in 2013. ABI which was normal on the right side and mildly reduced on the left side at 0.72. Arterial duplex which showed severe focal left mid SFA stenosis. He was treated medically given lack of symptoms. He had bladder cancer in March of 2013 and underwent treatment with localized intra- bladder injection . He did have a Lexiscan Myoview done in September 2016 which overall was low risk although there was small reversibility in the distal anterior and apical segment. EF was normal. He has chronic medical conditions include hyperlipidemia and hypertension. He went to the emergency room on January 12th with left-sided rib cage pain which he noticed after he woke up from sleep. Pain was worse with inspiration and movement. He had labs done which were unremarkable with the exception of mild leukocytosis at 11.6. Troponin was normal at 5. Chest x-ray showed small to moderate left pleural effusion with possible left basilar opacity representing atelectasis, aspiration or pneumonia. He is a poor historian overall but reports that the chest pain happened at night and it was a dull aching sensation with no radiation.  It lasted for about 12 hours continuous and then symptoms resolved.  No shortness of breath.  He did  feel nauseous.  He is not very active and walks slowly with a walker and frequently uses a wheelchair.  He does report recent dry cough but no fever.  He was prescribed Imdur recently but reports that the chest pain was resolved even before starting the medication.   Past Medical History:  Diagnosis Date  . Back pain   . Bladder tumor   . BPH (benign prostatic hyperplasia)   . Hyperlipidemia   . Hypertension   . Impaired hearing BILATERAL HEARING AIDS  . Personal history of gastric ulcer     Past Surgical History:  Procedure Laterality Date  . APPENDECTOMY  1957  . CATARACT EXTRACTION W/ INTRAOCULAR LENS  IMPLANT, BILATERAL    . CYSTOSCOPY WITH BIOPSY  03/08/2012   Procedure: CYSTOSCOPY WITH BIOPSY;  Surgeon: Claybon Jabs, MD;  Location: [facility];  Service: Urology;  Laterality: N/A;  gyrus  . TONSILLECTOMY  CHILD  . TRANSURETHRAL INCISION OF PROSTATE  03/08/2012   Procedure: TRANSURETHRAL INCISION OF THE PROSTATE (TUIP);  Surgeon: Claybon Jabs, MD;  Location: [facility];  Service: Urology;  Laterality: N/A;  . TRANSURETHRAL RESECTION OF BLADDER TUMOR  01/26/2012   Procedure: TRANSURETHRAL RESECTION OF BLADDER TUMOR (TURBT);  Surgeon: Claybon Jabs, MD;  Location: [facility];  Service: Urology;  Laterality: N/A;  GYRUS MYTOMICIN C      Current Outpatient Medications  Medication Sig Dispense Refill  . acetaminophen (TYLENOL) 325 MG tablet Take 2  tablets (650 mg total) by mouth every 6 (six) hours as needed for mild pain or moderate pain. 30 tablet 1  . acetaminophen (TYLENOL) 500 MG tablet Take 500 mg by mouth daily as needed for mild pain or headache.    . clopidogrel (PLAVIX) 75 MG tablet Take 75 mg by mouth daily.    Marland Kitchen escitalopram (LEXAPRO) 20 MG tablet Take 20 mg by mouth daily.    Marland Kitchen ezetimibe (ZETIA) 10 MG tablet Take 10 mg by mouth daily.    Marland Kitchen lisinopril (PRINIVIL,ZESTRIL) 20 MG tablet Take 20 mg by mouth daily.     .  isosorbide mononitrate (IMDUR) 60 MG 24 hr tablet Take 60 mg by mouth every morning.     No current facility-administered medications for this visit.    Allergies:   Livalo [pitavastatin], Livalo [pitavastatin], and Other    Social History:  The patient  reports that he quit smoking about 39 years ago. His smoking use included cigarettes. He quit after 30.00 years of use. He has never used smokeless tobacco. He reports that he does not drink alcohol and does not use drugs.   Family History:  The patient's family history includes Hypertension in his father; Kidney cancer in his mother.    ROS:  Please see the history of present illness.   Otherwise, review of systems are positive for none.   All other systems are reviewed and negative.    PHYSICAL EXAM: VS:  BP (!) 110/52 (BP Location: Left Arm, Patient Position: Sitting, Cuff Size: Normal)   Pulse 79   Ht 5\' 8"  (1.727 m)   Wt 152 lb (68.9 kg)   SpO2 94%   BMI 23.11 kg/m  , BMI Body mass index is 23.11 kg/m. GEN: Well nourished, well developed, in no acute distress  HEENT: normal  Neck: no JVD, carotid bruits, or masses Cardiac: RRR; no murmurs, rubs, or gallops,no edema  Respiratory:  clear to auscultation bilaterally, normal work of breathing GI: soft, nontender, nondistended, + BS MS: no deformity or atrophy  Skin: warm and dry, no rash Neuro:  Strength and sensation are intact Psych: euthymic mood, full affect   EKG:  EKG is ordered today. The ekg ordered today demonstrates normal sinus rhythm with no significant ST or T wave changes.   Recent Labs: 12/26/2020: BUN 17; Creatinine, Ser 1.11; Hemoglobin 13.8; Platelets 356; Potassium 4.3; Sodium 139    Lipid Panel No results found for: CHOL, TRIG, HDL, CHOLHDL, VLDL, LDLCALC, LDLDIRECT    Wt Readings from Last 3 Encounters:  01/02/21 152 lb (68.9 kg)  06/29/20 145 lb (65.8 kg)  04/24/20 165 lb 5.5 oz (75 kg)      No flowsheet data found.    ASSESSMENT AND  PLAN:  1. Chest pain: Seems to be atypical and likely musculoskeletal based on his symptoms.  He reports no further chest pain.  Given risk factors and vague symptoms, I suggested a Lexiscan nuclear stress test for risk stratification but the patient wants to think about it and talk with his wife before proceeding.  2. Peripheral arterial disease: No claudication.  His functional capacity is not great.  3. Essential hypertension: Blood pressures controlled.  4. Hyperlipidemia: Currently on Zetia.  5.  Small left pleural effusion with possible underlying atelectasis or mass.  No clear symptoms of pneumonia.  I recommend a repeat chest x-ray in few weeks to ensure resolution or improvement and if there is no improvement, consider CT scan or referral to pulmonary.  Disposition:   FU with  Me as needed  Signed,  Kathlyn Sacramento, MD  01/02/2021 4:57 PM    [place]

## 2021-01-09 ENCOUNTER — Telehealth: Payer: Self-pay | Admitting: Cardiovascular Disease

## 2021-01-09 NOTE — Telephone Encounter (Signed)
Patient daughter on dpr calling to confirm home medication discussed at visit recently   Patient taking   Plavix 75 mg po q d  Lisinopril 20 mg po q d  Donepezil 5 mg po q d  Isosorbide ER 60 mg po q d  lexepro 20 mg po q d  Ezetimibe 10 mg po q d  Men over 50 gummy vitamin 2 gummies q d    Daughter asking which leg and how severe his blockage is and if interventions are needed or possible.  Patient and family still thinking over doing stress test.

## 2021-01-09 NOTE — Telephone Encounter (Signed)
Spoke with the patients daughter [name]. Updated the patients medication list.  Reviewed the patients recent o/v with Dr. Fletcher Anon. Melanie's questions answered to the best if my ability. She has no further questions at this time. [name] sts that the patient has not had any reoccurrence of chest pain. Patient is to f/u with Dr. Fletcher Anon prn. They will contact the office to schedule the myoview if cardiac symptoms develop.

## 2021-04-13 DIAGNOSIS — E785 Hyperlipidemia, unspecified: Secondary | ICD-10-CM | POA: Diagnosis not present

## 2021-04-13 DIAGNOSIS — F0390 Unspecified dementia without behavioral disturbance: Secondary | ICD-10-CM | POA: Diagnosis not present

## 2021-04-13 DIAGNOSIS — I10 Essential (primary) hypertension: Secondary | ICD-10-CM | POA: Diagnosis not present

## 2021-05-14 DIAGNOSIS — E785 Hyperlipidemia, unspecified: Secondary | ICD-10-CM | POA: Diagnosis not present

## 2021-05-14 DIAGNOSIS — F0390 Unspecified dementia without behavioral disturbance: Secondary | ICD-10-CM | POA: Diagnosis not present

## 2021-05-14 DIAGNOSIS — I10 Essential (primary) hypertension: Secondary | ICD-10-CM | POA: Diagnosis not present

## 2021-06-13 DIAGNOSIS — I10 Essential (primary) hypertension: Secondary | ICD-10-CM | POA: Diagnosis not present

## 2021-06-13 DIAGNOSIS — F0390 Unspecified dementia without behavioral disturbance: Secondary | ICD-10-CM | POA: Diagnosis not present

## 2021-06-13 DIAGNOSIS — E785 Hyperlipidemia, unspecified: Secondary | ICD-10-CM | POA: Diagnosis not present

## 2021-07-04 DIAGNOSIS — E785 Hyperlipidemia, unspecified: Secondary | ICD-10-CM | POA: Diagnosis not present

## 2021-07-04 DIAGNOSIS — E538 Deficiency of other specified B group vitamins: Secondary | ICD-10-CM | POA: Diagnosis not present

## 2021-07-04 DIAGNOSIS — R634 Abnormal weight loss: Secondary | ICD-10-CM | POA: Diagnosis not present

## 2021-07-04 DIAGNOSIS — D692 Other nonthrombocytopenic purpura: Secondary | ICD-10-CM | POA: Diagnosis not present

## 2021-07-04 DIAGNOSIS — R531 Weakness: Secondary | ICD-10-CM | POA: Diagnosis not present

## 2021-07-04 DIAGNOSIS — I10 Essential (primary) hypertension: Secondary | ICD-10-CM | POA: Diagnosis not present

## 2021-07-04 DIAGNOSIS — R2681 Unsteadiness on feet: Secondary | ICD-10-CM | POA: Diagnosis not present

## 2021-07-04 DIAGNOSIS — I739 Peripheral vascular disease, unspecified: Secondary | ICD-10-CM | POA: Diagnosis not present

## 2021-07-04 DIAGNOSIS — C679 Malignant neoplasm of bladder, unspecified: Secondary | ICD-10-CM | POA: Diagnosis not present

## 2021-07-04 DIAGNOSIS — F0390 Unspecified dementia without behavioral disturbance: Secondary | ICD-10-CM | POA: Diagnosis not present

## 2021-07-04 DIAGNOSIS — F329 Major depressive disorder, single episode, unspecified: Secondary | ICD-10-CM | POA: Diagnosis not present

## 2021-07-26 DIAGNOSIS — Z8551 Personal history of malignant neoplasm of bladder: Secondary | ICD-10-CM | POA: Diagnosis not present

## 2021-08-14 DIAGNOSIS — I10 Essential (primary) hypertension: Secondary | ICD-10-CM | POA: Diagnosis not present

## 2021-08-14 DIAGNOSIS — F0390 Unspecified dementia without behavioral disturbance: Secondary | ICD-10-CM | POA: Diagnosis not present

## 2021-08-14 DIAGNOSIS — E785 Hyperlipidemia, unspecified: Secondary | ICD-10-CM | POA: Diagnosis not present

## 2021-09-04 ENCOUNTER — Inpatient Hospital Stay (HOSPITAL_COMMUNITY): Payer: PPO

## 2021-09-04 ENCOUNTER — Encounter (HOSPITAL_COMMUNITY): Payer: Self-pay

## 2021-09-04 ENCOUNTER — Inpatient Hospital Stay (HOSPITAL_COMMUNITY)
Admission: EM | Admit: 2021-09-04 | Discharge: 2021-09-07 | DRG: 178 | Disposition: A | Discharge status: Home Health | Payer: PPO | Attending: Family Medicine | Admitting: Family Medicine

## 2021-09-04 ENCOUNTER — Other Ambulatory Visit: Payer: Self-pay

## 2021-09-04 ENCOUNTER — Emergency Department (HOSPITAL_COMMUNITY): Payer: PPO

## 2021-09-04 DIAGNOSIS — Z79899 Other long term (current) drug therapy: Secondary | ICD-10-CM

## 2021-09-04 DIAGNOSIS — Z87891 Personal history of nicotine dependence: Secondary | ICD-10-CM | POA: Diagnosis not present

## 2021-09-04 DIAGNOSIS — J918 Pleural effusion in other conditions classified elsewhere: Secondary | ICD-10-CM | POA: Diagnosis present

## 2021-09-04 DIAGNOSIS — R0602 Shortness of breath: Secondary | ICD-10-CM

## 2021-09-04 DIAGNOSIS — R531 Weakness: Secondary | ICD-10-CM | POA: Diagnosis not present

## 2021-09-04 DIAGNOSIS — Z8249 Family history of ischemic heart disease and other diseases of the circulatory system: Secondary | ICD-10-CM

## 2021-09-04 DIAGNOSIS — F05 Delirium due to known physiological condition: Secondary | ICD-10-CM | POA: Diagnosis not present

## 2021-09-04 DIAGNOSIS — L8990 Pressure ulcer of unspecified site, unspecified stage: Secondary | ICD-10-CM | POA: Insufficient documentation

## 2021-09-04 DIAGNOSIS — R059 Cough, unspecified: Secondary | ICD-10-CM | POA: Diagnosis not present

## 2021-09-04 DIAGNOSIS — J9811 Atelectasis: Secondary | ICD-10-CM | POA: Diagnosis not present

## 2021-09-04 DIAGNOSIS — I2510 Atherosclerotic heart disease of native coronary artery without angina pectoris: Secondary | ICD-10-CM | POA: Diagnosis present

## 2021-09-04 DIAGNOSIS — N400 Enlarged prostate without lower urinary tract symptoms: Secondary | ICD-10-CM | POA: Diagnosis present

## 2021-09-04 DIAGNOSIS — E785 Hyperlipidemia, unspecified: Secondary | ICD-10-CM

## 2021-09-04 DIAGNOSIS — Z8051 Family history of malignant neoplasm of kidney: Secondary | ICD-10-CM

## 2021-09-04 DIAGNOSIS — L89312 Pressure ulcer of right buttock, stage 2: Secondary | ICD-10-CM | POA: Diagnosis not present

## 2021-09-04 DIAGNOSIS — I10 Essential (primary) hypertension: Secondary | ICD-10-CM | POA: Diagnosis not present

## 2021-09-04 DIAGNOSIS — F32A Depression, unspecified: Secondary | ICD-10-CM | POA: Diagnosis present

## 2021-09-04 DIAGNOSIS — Z8551 Personal history of malignant neoplasm of bladder: Secondary | ICD-10-CM

## 2021-09-04 DIAGNOSIS — J90 Pleural effusion, not elsewhere classified: Secondary | ICD-10-CM | POA: Diagnosis not present

## 2021-09-04 DIAGNOSIS — I739 Peripheral vascular disease, unspecified: Secondary | ICD-10-CM | POA: Diagnosis not present

## 2021-09-04 DIAGNOSIS — Z7902 Long term (current) use of antithrombotics/antiplatelets: Secondary | ICD-10-CM

## 2021-09-04 DIAGNOSIS — U071 COVID-19: Principal | ICD-10-CM

## 2021-09-04 DIAGNOSIS — F03918 Unspecified dementia, unspecified severity, with other behavioral disturbance: Secondary | ICD-10-CM

## 2021-09-04 DIAGNOSIS — W19XXXA Unspecified fall, initial encounter: Secondary | ICD-10-CM

## 2021-09-04 DIAGNOSIS — Z9181 History of falling: Secondary | ICD-10-CM | POA: Diagnosis not present

## 2021-09-04 DIAGNOSIS — R0902 Hypoxemia: Secondary | ICD-10-CM | POA: Diagnosis not present

## 2021-09-04 DIAGNOSIS — I700 Atherosclerosis of aorta: Secondary | ICD-10-CM | POA: Diagnosis not present

## 2021-09-04 DIAGNOSIS — F0390 Unspecified dementia without behavioral disturbance: Secondary | ICD-10-CM | POA: Diagnosis not present

## 2021-09-04 DIAGNOSIS — S0990XA Unspecified injury of head, initial encounter: Secondary | ICD-10-CM | POA: Diagnosis not present

## 2021-09-04 LAB — CBC WITH DIFFERENTIAL/PLATELET
Abs Immature Granulocytes: 0.01 10*3/uL (ref 0.00–0.07)
Basophils Absolute: 0 10*3/uL (ref 0.0–0.1)
Basophils Relative: 0 %
Eosinophils Absolute: 0 10*3/uL (ref 0.0–0.5)
Eosinophils Relative: 0 %
HCT: 39.3 % (ref 39.0–52.0)
Hemoglobin: 12.3 g/dL — ABNORMAL LOW (ref 13.0–17.0)
Immature Granulocytes: 0 %
Lymphocytes Relative: 15 %
Lymphs Abs: 0.8 10*3/uL (ref 0.7–4.0)
MCH: 28.7 pg (ref 26.0–34.0)
MCHC: 31.3 g/dL (ref 30.0–36.0)
MCV: 91.8 fL (ref 80.0–100.0)
Monocytes Absolute: 0.9 10*3/uL (ref 0.1–1.0)
Monocytes Relative: 17 %
Neutro Abs: 3.5 10*3/uL (ref 1.7–7.7)
Neutrophils Relative %: 68 %
Platelets: 289 10*3/uL (ref 150–400)
RBC: 4.28 MIL/uL (ref 4.22–5.81)
RDW: 13.9 % (ref 11.5–15.5)
WBC: 5.2 10*3/uL (ref 4.0–10.5)
nRBC: 0 % (ref 0.0–0.2)

## 2021-09-04 LAB — COMPREHENSIVE METABOLIC PANEL
ALT: 17 U/L (ref 0–44)
AST: 20 U/L (ref 15–41)
Albumin: 4 g/dL (ref 3.5–5.0)
Alkaline Phosphatase: 79 U/L (ref 38–126)
Anion gap: 12 (ref 5–15)
BUN: 22 mg/dL (ref 8–23)
CO2: 24 mmol/L (ref 22–32)
Calcium: 9.1 mg/dL (ref 8.9–10.3)
Chloride: 102 mmol/L (ref 98–111)
Creatinine, Ser: 1.07 mg/dL (ref 0.61–1.24)
GFR, Estimated: >60 mL/min (ref >60)
Glucose, Bld: 91 mg/dL (ref 70–99)
Potassium: 4.6 mmol/L (ref 3.5–5.1)
Sodium: 138 mmol/L (ref 135–145)
Total Bilirubin: 0.6 mg/dL (ref 0.3–1.2)
Total Protein: 7.4 g/dL (ref 6.5–8.1)

## 2021-09-04 LAB — URINALYSIS, ROUTINE W REFLEX MICROSCOPIC
Bilirubin Urine: NEGATIVE
Glucose, UA: NEGATIVE mg/dL
Leukocytes,Ua: NEGATIVE
Nitrite: NEGATIVE
Protein, ur: 30 mg/dL — AB
Specific Gravity, Urine: 1.02 (ref 1.005–1.030)
pH: 6 (ref 5.0–8.0)

## 2021-09-04 MED ORDER — SODIUM CHLORIDE 0.9 % IV SOLN
100.0000 mg | Freq: Once | INTRAVENOUS | Status: AC
  Administered 2021-09-05: 100 mg via INTRAVENOUS
  Filled 2021-09-04: qty 20

## 2021-09-04 MED ORDER — ESCITALOPRAM OXALATE 10 MG PO TABS
20.0000 mg | ORAL_TABLET | Freq: Every day | ORAL | Status: DC
  Administered 2021-09-05 – 2021-09-07 (×3): 20 mg via ORAL
  Filled 2021-09-04 (×3): qty 2

## 2021-09-04 MED ORDER — DONEPEZIL HCL 5 MG PO TABS
10.0000 mg | ORAL_TABLET | Freq: Every day | ORAL | Status: DC
  Administered 2021-09-05 – 2021-09-06 (×3): 10 mg via ORAL
  Filled 2021-09-04 (×3): qty 2

## 2021-09-04 MED ORDER — METHYLPREDNISOLONE SODIUM SUCC 40 MG IJ SOLR
40.0000 mg | Freq: Every day | INTRAMUSCULAR | Status: DC
  Administered 2021-09-05 – 2021-09-07 (×4): 40 mg via INTRAVENOUS
  Filled 2021-09-04 (×4): qty 1

## 2021-09-04 MED ORDER — CLOPIDOGREL BISULFATE 75 MG PO TABS
75.0000 mg | ORAL_TABLET | Freq: Every day | ORAL | Status: DC
  Administered 2021-09-05 – 2021-09-07 (×3): 75 mg via ORAL
  Filled 2021-09-04 (×3): qty 1

## 2021-09-04 MED ORDER — EZETIMIBE 10 MG PO TABS
10.0000 mg | ORAL_TABLET | Freq: Every day | ORAL | Status: DC
  Administered 2021-09-05 – 2021-09-07 (×3): 10 mg via ORAL
  Filled 2021-09-04 (×3): qty 1

## 2021-09-04 MED ORDER — ENOXAPARIN SODIUM 40 MG/0.4ML IJ SOSY
40.0000 mg | PREFILLED_SYRINGE | INTRAMUSCULAR | Status: DC
  Administered 2021-09-05 – 2021-09-06 (×3): 40 mg via SUBCUTANEOUS
  Filled 2021-09-04 (×3): qty 0.4

## 2021-09-04 MED ORDER — ACETAMINOPHEN 500 MG PO TABS: 500.0000 mg | ORAL_TABLET | Freq: Every day | ORAL | Status: DC | PRN

## 2021-09-04 MED ORDER — LISINOPRIL 10 MG PO TABS
10.0000 mg | ORAL_TABLET | Freq: Every day | ORAL | Status: DC
  Administered 2021-09-05 – 2021-09-07 (×3): 10 mg via ORAL
  Filled 2021-09-04 (×3): qty 1

## 2021-09-04 MED ORDER — ISOSORBIDE MONONITRATE ER 60 MG PO TB24
60.0000 mg | ORAL_TABLET | Freq: Every morning | ORAL | Status: DC
  Administered 2021-09-05 – 2021-09-07 (×3): 60 mg via ORAL
  Filled 2021-09-04 (×3): qty 1

## 2021-09-04 MED ORDER — ALBUTEROL SULFATE HFA 108 (90 BASE) MCG/ACT IN AERS: 2.0000 | INHALATION_SPRAY | RESPIRATORY_TRACT | Status: DC | PRN

## 2021-09-04 MED ORDER — SODIUM CHLORIDE 0.9 % IV SOLN
100.0000 mg | Freq: Every day | INTRAVENOUS | Status: DC
  Administered 2021-09-05 – 2021-09-07 (×3): 100 mg via INTRAVENOUS
  Filled 2021-09-04 (×3): qty 20

## 2021-09-04 NOTE — ED Notes (Signed)
Attempted to ambulate pt to monitor pulse oximetry. Pt unable to ambulate even with assistance. Pt was alert but seemed physically weak.

## 2021-09-04 NOTE — ED Notes (Signed)
ERP at bedside

## 2021-09-04 NOTE — ED Notes (Signed)
Pt placed on 2l O2 via N/C

## 2021-09-04 NOTE — ED Notes (Signed)
Patient transported to CT 

## 2021-09-04 NOTE — ED Triage Notes (Signed)
Patient brought in via ems from home. Patient states he has been feeling weak and tired for a few weeks. Family called PCP this morning who tested him for covid-came back positive. Per family patient is normally able to care for self, but has recently not been getting out of bed. PCP sent him here for "infusion" but unable to specify

## 2021-09-04 NOTE — ED Provider Notes (Signed)
[facility] DEPT Provider Note   CSN: [id number] Arrival date & time: 09/04/21  1729     History Chief Complaint  Patient presents with   Fatigue    Paul [name] is a 87 y.o. male.  The history is provided by the patient and medical records. No language interpreter was used.  Cough Cough characteristics:  Non-productive Sputum characteristics:  Unable to specify Severity:  Moderate Onset quality:  Gradual Duration:  2 days (chjronci cough but worse in last 2 days) Timing:  Constant Progression:  Worsening Chronicity:  New Relieved by:  Nothing Worsened by:  Nothing Ineffective treatments:  None tried Associated symptoms: chills, headaches, shortness of breath and sinus congestion   Associated symptoms: no chest pain, no diaphoresis, no fever, no rash, no sore throat and no wheezing       Past Medical History:  Diagnosis Date   Back pain    Bladder tumor    BPH (benign prostatic hyperplasia)    Hyperlipidemia    Hypertension    Impaired hearing BILATERAL HEARING AIDS   Personal history of gastric ulcer     Patient Active Problem List   Diagnosis Date Noted   Exertional dyspnea 08/28/2015   Depressive disorder 08/28/2015   PAD (peripheral artery disease) ([place]) 05/26/2012   Dyspnea 04/09/2012   Hypertension 04/09/2012   Claudication ([place]) 04/09/2012   Transitional cell carcinoma of bladder ([place]) 01/25/2012    Past Surgical History:  Procedure Laterality Date   APPENDECTOMY  1957   CATARACT EXTRACTION W/ INTRAOCULAR LENS  IMPLANT, BILATERAL     CYSTOSCOPY WITH BIOPSY  03/08/2012   Procedure: CYSTOSCOPY WITH BIOPSY;  Surgeon: Claybon Jabs, MD;  Location: [facility];  Service: Urology;  Laterality: N/A;  gyrus   TONSILLECTOMY  CHILD   TRANSURETHRAL INCISION OF PROSTATE  03/08/2012   Procedure: TRANSURETHRAL INCISION OF THE PROSTATE (TUIP);  Surgeon: Claybon Jabs, MD;  Location: [facility];   Service: Urology;  Laterality: N/A;   TRANSURETHRAL RESECTION OF BLADDER TUMOR  01/26/2012   Procedure: TRANSURETHRAL RESECTION OF BLADDER TUMOR (TURBT);  Surgeon: Claybon Jabs, MD;  Location[facility];  Service: Urology;  Laterality: N/A;  GYRUS MYTOMICIN C        Family History  Problem Relation Age of Onset   Kidney cancer Mother    Hypertension Father     Social History   Tobacco Use   Smoking status: Former    Years: 30.00    Types: Cigarettes    Quit date: 01/20/1981    Years since quitting: 40.6   Smokeless tobacco: Never  Substance Use Topics   Alcohol use: No   Drug use: No    Home Medications Prior to Admission medications   Medication Sig Start Date End Date Taking? Authorizing Provider  acetaminophen (TYLENOL) 500 MG tablet Take 500 mg by mouth daily as needed for mild pain or headache.    [provider]  clopidogrel (PLAVIX) 75 MG tablet Take 75 mg by mouth daily. 01/27/20   [provider]  donepezil (ARICEPT) 10 MG tablet Take 10 mg by mouth at bedtime.    [provider]  escitalopram (LEXAPRO) 20 MG tablet Take 20 mg by mouth daily. 01/24/20   [provider]  ezetimibe (ZETIA) 10 MG tablet Take 10 mg by mouth daily.    [provider]  isosorbide mononitrate (IMDUR) 60 MG 24 hr tablet Take 60 mg by mouth  every morning. 12/28/20   [provider]  lisinopril (PRINIVIL,ZESTRIL) 20 MG tablet Take 20 mg by mouth daily.  05/21/13   [provider]  simvastatin (ZOCOR) 20 MG tablet Take 20 mg by mouth every evening.  02/27/12  [provider]    Allergies    Livalo [pitavastatin], Livalo [pitavastatin], and Other  Review of Systems   Review of Systems  Constitutional:  Positive for chills and fatigue. Negative for diaphoresis and fever.  HENT:  Negative for congestion and sore throat.   Respiratory:  Positive for cough and shortness of breath. Negative for chest tightness  and wheezing.   Cardiovascular:  Negative for chest pain and palpitations.  Gastrointestinal:  Negative for abdominal pain, constipation, diarrhea, nausea and vomiting.  Genitourinary:  Negative for dysuria, flank pain and frequency.  Musculoskeletal:  Negative for back pain, neck pain and neck stiffness.  Skin:  Negative for rash and wound.  Neurological:  Positive for headaches. Negative for dizziness, weakness, light-headedness and numbness.  Psychiatric/Behavioral:  Negative for agitation and confusion.   All other systems reviewed and are negative.  Physical Exam Updated Vital Signs BP (!) 167/84 (BP Location: Right Arm)   Pulse 71   Temp 99.1 F (37.3 C) (Oral)   Resp (!) 22   Ht 5\' 8"  (1.727 m)   Wt 68 kg   SpO2 95%   BMI 22.81 kg/m   Physical Exam Vitals and nursing note reviewed.  Constitutional:      General: He is not in acute distress.    Appearance: He is well-developed. He is not ill-appearing, toxic-appearing or diaphoretic.  HENT:     Head: Normocephalic and atraumatic.     Nose: No congestion or rhinorrhea.     Mouth/Throat:     Mouth: Mucous membranes are moist.     Pharynx: No oropharyngeal exudate or posterior oropharyngeal erythema.  Eyes:     Extraocular Movements: Extraocular movements intact.     Conjunctiva/sclera: Conjunctivae normal.     Pupils: Pupils are equal, round, and reactive to light.  Cardiovascular:     Rate and Rhythm: Normal rate and regular rhythm.     Heart sounds: No murmur heard. Pulmonary:     Effort: Pulmonary effort is normal. No respiratory distress.     Breath sounds: Rhonchi present. No wheezing or rales.  Chest:     Chest wall: No tenderness.  Abdominal:     Palpations: Abdomen is soft.     Tenderness: There is no abdominal tenderness. There is no right CVA tenderness, left CVA tenderness, guarding or rebound.  Musculoskeletal:        General: No tenderness.     Cervical back: Neck supple. No tenderness.  Skin:     General: Skin is warm and dry.     Capillary Refill: Capillary refill takes less than 2 seconds.     Findings: No erythema or rash.  Neurological:     General: No focal deficit present.     Mental Status: He is alert. Mental status is at baseline.     Sensory: No sensory deficit.     Motor: No weakness.  Psychiatric:        Mood and Affect: Mood normal.    ED Results / Procedures / Treatments   Labs (all labs ordered are listed, but only abnormal results are displayed) Labs Reviewed  CBC WITH DIFFERENTIAL/PLATELET - Abnormal; Notable for the following components:      Result Value   Hemoglobin  12.3 (*)    All other components within normal limits  URINALYSIS, ROUTINE W REFLEX MICROSCOPIC - Abnormal; Notable for the following components:   Hgb urine dipstick TRACE (*)    Ketones, ur TRACE (*)    Protein, ur 30 (*)    Bacteria, UA RARE (*)    All other components within normal limits  URINE CULTURE  COMPREHENSIVE METABOLIC PANEL  PROCALCITONIN  COMPREHENSIVE METABOLIC PANEL    EKG None  Radiology CT HEAD WO CONTRAST (5MM)  Result Date: 09/04/2021 CLINICAL DATA:  Head trauma. EXAM: CT HEAD WITHOUT CONTRAST TECHNIQUE: Contiguous axial images were obtained from the base of the skull through the vertex without intravenous contrast. COMPARISON:  Head CT dated 06/29/2020 FINDINGS: Brain: Mild age-related atrophy and chronic microvascular ischemic changes. There is no acute intracranial hemorrhage. No mass effect or midline shift. No extra-axial fluid collection. Vascular: No hyperdense vessel or unexpected calcification. Skull: Normal. Negative for fracture or focal lesion. Sinuses/Orbits: No acute finding. Other: None IMPRESSION: 1. No acute intracranial pathology. 2. Mild age-related atrophy and chronic microvascular ischemic changes. Electronically Signed   By: Anner Crete M.D.   On: 09/04/2021 20:26   CT CHEST WO CONTRAST  Result Date: 09/04/2021 CLINICAL DATA:  COVID  positive with shortness of breath and history of chronic left-sided pleural effusion since May 2021. EXAM: CT CHEST WITHOUT CONTRAST TECHNIQUE: Multidetector CT imaging of the chest was performed following the standard protocol without IV contrast. COMPARISON:  None. FINDINGS: Cardiovascular: There is marked severity calcification of the aortic arch and descending thoracic aorta, without evidence of aneurysmal dilatation. Normal heart size with moderate severity coronary artery calcification. No pericardial effusion. Mediastinum/Nodes: No enlarged mediastinal or axillary lymph nodes. Thyroid gland, trachea, and esophagus demonstrate no significant findings. Lungs/Pleura: Moderate severity consolidation is seen within the left lower lobe. Mild to moderate severity posterior right lower lobe linear scarring and/or atelectasis is noted. A small, partially loculated left pleural effusion is seen. No pneumothorax is identified. Upper Abdomen: No acute abnormality. Musculoskeletal: A chronic posterior ninth left rib deformity is seen. Multilevel degenerative changes seen throughout the thoracic spine. IMPRESSION: 1. Moderate severity left lower lobe consolidation consistent with an extensive amount of atelectasis and/or infiltrate. 2. Small, partially loculated left pleural effusion. 3. Mild to moderate severity posterior right lower lobe linear scarring and/or atelectasis. 4. Moderate severity coronary artery disease. 5. Aortic atherosclerosis. Aortic Atherosclerosis (ICD10-I70.0). Electronically Signed   By: Virgina Norfolk M.D.   On: 09/04/2021 23:39   DG Chest Port 1 View  Result Date: 09/04/2021 CLINICAL DATA:  Weak and tired for a few weeks.  Positive COVID. EXAM: PORTABLE CHEST 1 VIEW COMPARISON:  12/26/2020 FINDINGS: Heart size and pulmonary vascularity are normal. Left pleural effusion or thickening, unchanged since prior study. Possible consolidation or collapse of the left lower lung. This is also  unchanged. Right lung is clear. Mediastinal contours appear intact. Calcification of the aorta. IMPRESSION: Moderate-sized chronic left pleural effusion versus pleural thickening with collapse or consolidation in the left lung base. Changes are similar to previous study. Consider underlying obstructing lesion. Right lung is clear. Electronically Signed   By: Lucienne Capers M.D.   On: 09/04/2021 19:20    Procedures Procedures   CRITICAL CARE Performed by: Gwenyth Allegra Jearldine Cassady Total critical care time: 35 minutes Critical care time was exclusive of separately billable procedures and treating other patients. Critical care was necessary to treat or prevent imminent or life-threatening deterioration. Critical care was time spent personally by  me on the following activities: development of treatment plan with patient and/or surrogate as well as nursing, discussions with consultants, evaluation of patient's response to treatment, examination of patient, obtaining history from patient or surrogate, ordering and performing treatments and interventions, ordering and review of laboratory studies, ordering and review of radiographic studies, pulse oximetry and re-evaluation of patient's condition.  Medications Ordered in ED Medications  albuterol (VENTOLIN HFA) 108 (90 Base) MCG/ACT inhaler 2 puff (has no administration in time range)  enoxaparin (LOVENOX) injection 40 mg (has no administration in time range)  methylPREDNISolone sodium succinate (SOLU-MEDROL) 40 mg/mL injection 40 mg (has no administration in time range)  remdesivir 100 mg in sodium chloride 0.9 % 100 mL IVPB (has no administration in time range)    Followed by  remdesivir 100 mg in sodium chloride 0.9 % 100 mL IVPB (has no administration in time range)    Followed by  remdesivir 100 mg in sodium chloride 0.9 % 100 mL IVPB (has no administration in time range)  lisinopril (ZESTRIL) tablet 10 mg (has no administration in time range)   isosorbide mononitrate (IMDUR) 24 hr tablet 60 mg (has no administration in time range)  ezetimibe (ZETIA) tablet 10 mg (has no administration in time range)  acetaminophen (TYLENOL) tablet 500 mg (has no administration in time range)  donepezil (ARICEPT) tablet 10 mg (has no administration in time range)  escitalopram (LEXAPRO) tablet 20 mg (has no administration in time range)  clopidogrel (PLAVIX) tablet 75 mg (has no administration in time range)    ED Course  I have reviewed the triage vital signs and the nursing notes.  Pertinent labs & imaging results that were available during my care of the patient were reviewed by me and considered in my medical decision making (see chart for details).    MDM Rules/Calculators/A&P                           Paul [name] is a 87 y.o. male with a past medical history significant for previous bladder cancer, peripheral arterial disease, hypertension, hyperlipidemia, who was diagnosed with COVID-19 this morning who presents at the direction of PCP for evaluation and possible infusion treatment.  According to family and patient, patient has had 3 falls in the last week and a half.  He had the falls before he was concerned about COVID infection but does report some mild headaches ever since the fall on Monday, 3 days ago.  Patient denies any vision changes, nausea, vomiting, numbness, tingling, weakness.  He says that for the last few days he has had more cough and a family ember was found to have COVID several days ago.  Patient tested this morning and was found to be positive.  He called his PCP who told him to come in for infusion treatment if he is able to go home.  Patient does report that he has had more exertional shortness of breath today than normal.  On my exam, patient does have some rhonchi but no chest or back tenderness.  No neck tenderness.  No focal neurologic deficits.  Patient had some visible congestion and rhinorrhea.  Normal strength  and sensation in extremities.  Abdomen nontender.  Patient otherwise well-appearing.  Oxygen saturations did dip into the low 90s during conversation.  As his symptoms are worsened with exertion with shortness of breath,  we will check pulse oximetry with ambulation to determine if he gets hypoxic.  If  he does get hypoxic, anticipate admission for this 87 year old with COVID and exertional shortness breath with cough.  We will get chest x-ray and some basic labs given the fatigue.  We will get CT head due to the fall.  If he does not hypoxic and work-up is reassuring, anticipate discharge home after infusion treatment.  If he is hypoxic, anticipate admission  9:14 PM CT head did not show acute fracture from the falls and chest x-ray shows similar effusion and consolidation to prior.  More concerning was his ambulatory pulse oximetry that was obtained and it dropped to 84% and he was symptomatic.  With the new hypoxia, new COVID diagnosis, and exertional shortness of breath, do not feel he is safe for discharge home.  Will call for admission for hypoxia with COVID and an 87 year old patient.  They did report that he had decreased urine she will add on a urinalysis however I do not think this is primary problem.  Will speak with medicine about antibiotics or not given the similar appearance to his previous x-ray.   Final Clinical Impression(s) / ED Diagnoses Final diagnoses:  COVID-19  Exertional shortness of breath  Cough  Hypoxia  Fall, initial encounter     Clinical Impression: 1. COVID-19   2. SOB (shortness of breath)   3. Exertional shortness of breath   4. Cough   5. Hypoxia   6. Fall, initial encounter     Disposition: Admit  This note was prepared with assistance of Systems analyst. Occasional wrong-word or sound-a-like substitutions may have occurred due to the inherent limitations of voice recognition software.     Nyja Westbrook, Gwenyth Allegra, MD 09/04/21  2352

## 2021-09-04 NOTE — H&P (Addendum)
History and Physical    Paul [name] UUV:[id number] DOB: 09-13-1930 DOA: 09/04/2021  PCP: Paul Infante, MD  Patient coming from: Home  I have personally briefly reviewed patient's old medical records in [place]  Chief Complaint: cough, weakness  HPI: Paul [name] is a 87 y.o. male with medical history significant for dementia, hx of bladder cancer in remission, HTN, PAD, HLD and depression who presents with increasing weakness and cough.   Patient reports that he has been feeling weak for the past month but otherwise unable to provide further history given dementia.  Daughter at bedside helps with history.  She reports that over the past year he has had numerous falls due to unsteady gait.  For the past week he has fallen about 3 times due to weakness in lower extremity.  Normally ambulates with a walker.  For the past several days had increased weakness and cough.  Son who also helps to care for him tested positive for COVID this weekend.  He has received 2 doses of Pfizer but has not received booster.  He then tested positive for COVID at primary care office and was asked to present for further evaluation in the ED.  ED Course: He had temperature of 99.1, blood pressure systolic of 474Q over 59D.  CBC showed no leukocytosis, hemoglobin of 12.3.  BMP otherwise unremarkable.  CT head negative. Chest x-ray shows chronic left-sided pleural effusion.  Review of Systems: Unable to obtain full ROS given dementia  Past Medical History:  Diagnosis Date   Back pain    Bladder tumor    BPH (benign prostatic hyperplasia)    Hyperlipidemia    Hypertension    Impaired hearing BILATERAL HEARING AIDS   Personal history of gastric ulcer     Past Surgical History:  Procedure Laterality Date   APPENDECTOMY  1957   CATARACT EXTRACTION W/ INTRAOCULAR LENS  IMPLANT, BILATERAL     CYSTOSCOPY WITH BIOPSY  03/08/2012   Procedure: CYSTOSCOPY WITH BIOPSY;  Surgeon: Claybon Jabs, MD;   Location[facility];  Service: Urology;  Laterality: N/A;  gyrus   TONSILLECTOMY  CHILD   TRANSURETHRAL INCISION OF PROSTATE  03/08/2012   Procedure: TRANSURETHRAL INCISION OF THE PROSTATE (TUIP);  Surgeon: Claybon Jabs, MD;  Location: [facility];  Service: Urology;  Laterality: N/A;   TRANSURETHRAL RESECTION OF BLADDER TUMOR  01/26/2012   Procedure: TRANSURETHRAL RESECTION OF BLADDER TUMOR (TURBT);  Surgeon: Claybon Jabs, MD;  Location: [facility];  Service: Urology;  Laterality: N/A;  GYRUS MYTOMICIN C      reports that he quit smoking about 40 years ago. His smoking use included cigarettes. He has never used smokeless tobacco. He reports that he does not drink alcohol and does not use drugs. Social History  Allergies  Allergen Reactions   Livalo [Pitavastatin] Other (See Comments)    Cramping and back pain   Livalo [Pitavastatin] Other (See Comments)    Cramping and back pain   Other Diarrhea and Other (See Comments)    Unnamed patch and tablets for dementia caused diarrhea (Possibly galantamine, from outside source)     Family History  Problem Relation Age of Onset   Kidney cancer Mother    Hypertension Father      Prior to Admission medications   Medication Sig Start Date End Date Taking? Authorizing Provider  acetaminophen (TYLENOL) 500 MG tablet Take 500 mg by mouth daily as needed for mild  pain or headache.   Yes [provider]  clopidogrel (PLAVIX) 75 MG tablet Take 75 mg by mouth daily. 01/27/20  Yes [provider]  donepezil (ARICEPT) 10 MG tablet Take 10 mg by mouth at bedtime.   Yes [provider]  escitalopram (LEXAPRO) 20 MG tablet Take 20 mg by mouth daily. 01/24/20  Yes [provider]  ezetimibe (ZETIA) 10 MG tablet Take 10 mg by mouth daily.   Yes [provider]  isosorbide mononitrate (IMDUR) 60 MG 24 hr tablet Take 60 mg by mouth every morning. 12/28/20  Yes  [provider]  lisinopril (ZESTRIL) 10 MG tablet Take 10 mg by mouth daily. 07/08/21  Yes [provider]  lisinopril (PRINIVIL,ZESTRIL) 20 MG tablet Take 20 mg by mouth daily.  Patient not taking: Reported on 09/04/2021 05/21/13   [provider]  simvastatin (ZOCOR) 20 MG tablet Take 20 mg by mouth every evening.  02/27/12  [provider]    Physical Exam: Vitals:   09/04/21 2230 09/04/21 2300 09/04/21 2330 09/04/21 2351  BP:  (!) 149/76 (!) 167/77 (!) 150/73  Pulse: 72  71 73  Resp: 20 (!) 21 20 20   Temp:    99.4 F (37.4 C)  TempSrc:    Oral  SpO2: 97%  95% 95%  Weight:    65.4 kg  Height:    5\' 8"  (1.727 m)    Constitutional: NAD, calm, comfortable, pleasant elderly male with dementia laying flat in bed and periodically picking at a pulse oximetry on his finger Vitals:   09/04/21 2230 09/04/21 2300 09/04/21 2330 09/04/21 2351  BP:  (!) 149/76 (!) 167/77 (!) 150/73  Pulse: 72  71 73  Resp: 20 (!) 21 20 20   Temp:    99.4 F (37.4 C)  TempSrc:    Oral  SpO2: 97%  95% 95%  Weight:    65.4 kg  Height:    5\' 8"  (1.727 m)   Eyes: PERRL, lids and conjunctivae normal ENMT: Mucous membranes are moist.  Neck: normal, supple Respiratory: clear to auscultation bilaterally, no wheezing, no crackles. Normal respiratory effort on 2L via [place]. No accessory muscle use.  Cardiovascular: Regular rate and rhythm, no murmurs / rubs / gallops. No extremity edema.  Abdomen: no tenderness, no masses palpated. Bowel sounds positive.  Musculoskeletal: no clubbing / cyanosis. No joint deformity upper and lower extremities.Normal muscle tone.  Skin: no rashes, lesions, ulcers. No induration Neurologic: CN 2-12 grossly intact. Sensation intact,  Strength 5/5 in all 4.  From bilateral handgrip. Psychiatric: Alert and oriented to self, place but not time.  Communicative and able to follow commands.  Normal mood.     Labs on Admission: I have personally reviewed  following labs and imaging studies  CBC: Recent Labs  Lab 09/04/21 1814  WBC 5.2  NEUTROABS 3.5  HGB 12.3*  HCT 39.3  MCV 91.8  PLT 951   Basic Metabolic Panel: Recent Labs  Lab 09/04/21 1814  NA 138  K 4.6  CL 102  CO2 24  GLUCOSE 91  BUN 22  CREATININE 1.07  CALCIUM 9.1   GFR: Estimated Creatinine Clearance: 43.3 mL/min (by C-G formula based on SCr of 1.07 mg/dL). Liver Function Tests: Recent Labs  Lab 09/04/21 1814  AST 20  ALT 17  ALKPHOS 79  BILITOT 0.6  PROT 7.4  ALBUMIN 4.0   No results for input(s): LIPASE, AMYLASE in the last 168 hours. No results for input(s): AMMONIA  in the last 168 hours. Coagulation Profile: No results for input(s): INR, PROTIME in the last 168 hours. Cardiac Enzymes: No results for input(s): CKTOTAL, CKMB, CKMBINDEX, TROPONINI in the last 168 hours. BNP (last 3 results) No results for input(s): PROBNP in the last 8760 hours. HbA1C: No results for input(s): HGBA1C in the last 72 hours. CBG: No results for input(s): GLUCAP in the last 168 hours. Lipid Profile: No results for input(s): CHOL, HDL, LDLCALC, TRIG, CHOLHDL, LDLDIRECT in the last 72 hours. Thyroid Function Tests: No results for input(s): TSH, T4TOTAL, FREET4, T3FREE, THYROIDAB in the last 72 hours. Anemia Panel: No results for input(s): VITAMINB12, FOLATE, FERRITIN, TIBC, IRON, RETICCTPCT in the last 72 hours. Urine analysis:    Component Value Date/Time   COLORURINE YELLOW 09/04/2021 2120   APPEARANCEUR CLEAR 09/04/2021 2120   LABSPEC 1.020 09/04/2021 2120   PHURINE 6.0 09/04/2021 2120   GLUCOSEU NEGATIVE 09/04/2021 2120   HGBUR TRACE (A) 09/04/2021 2120   BILIRUBINUR NEGATIVE 09/04/2021 2120   KETONESUR TRACE (A) 09/04/2021 2120   PROTEINUR 30 (A) 09/04/2021 2120   NITRITE NEGATIVE 09/04/2021 2120   LEUKOCYTESUR NEGATIVE 09/04/2021 2120    Radiological Exams on Admission: CT HEAD WO CONTRAST (5MM)  Result Date: 09/04/2021 CLINICAL DATA:  Head trauma.  EXAM: CT HEAD WITHOUT CONTRAST TECHNIQUE: Contiguous axial images were obtained from the base of the skull through the vertex without intravenous contrast. COMPARISON:  Head CT dated 06/29/2020 FINDINGS: Brain: Mild age-related atrophy and chronic microvascular ischemic changes. There is no acute intracranial hemorrhage. No mass effect or midline shift. No extra-axial fluid collection. Vascular: No hyperdense vessel or unexpected calcification. Skull: Normal. Negative for fracture or focal lesion. Sinuses/Orbits: No acute finding. Other: None IMPRESSION: 1. No acute intracranial pathology. 2. Mild age-related atrophy and chronic microvascular ischemic changes. Electronically Signed   By: Anner Crete M.D.   On: 09/04/2021 20:26   CT CHEST WO CONTRAST  Result Date: 09/04/2021 CLINICAL DATA:  COVID positive with shortness of breath and history of chronic left-sided pleural effusion since May 2021. EXAM: CT CHEST WITHOUT CONTRAST TECHNIQUE: Multidetector CT imaging of the chest was performed following the standard protocol without IV contrast. COMPARISON:  None. FINDINGS: Cardiovascular: There is marked severity calcification of the aortic arch and descending thoracic aorta, without evidence of aneurysmal dilatation. Normal heart size with moderate severity coronary artery calcification. No pericardial effusion. Mediastinum/Nodes: No enlarged mediastinal or axillary lymph nodes. Thyroid gland, trachea, and esophagus demonstrate no significant findings. Lungs/Pleura: Moderate severity consolidation is seen within the left lower lobe. Mild to moderate severity posterior right lower lobe linear scarring and/or atelectasis is noted. A small, partially loculated left pleural effusion is seen. No pneumothorax is identified. Upper Abdomen: No acute abnormality. Musculoskeletal: A chronic posterior ninth left rib deformity is seen. Multilevel degenerative changes seen throughout the thoracic spine. IMPRESSION: 1.  Moderate severity left lower lobe consolidation consistent with an extensive amount of atelectasis and/or infiltrate. 2. Small, partially loculated left pleural effusion. 3. Mild to moderate severity posterior right lower lobe linear scarring and/or atelectasis. 4. Moderate severity coronary artery disease. 5. Aortic atherosclerosis. Aortic Atherosclerosis (ICD10-I70.0). Electronically Signed   By: Virgina Norfolk M.D.   On: 09/04/2021 23:39   DG Chest Port 1 View  Result Date: 09/04/2021 CLINICAL DATA:  Weak and tired for a few weeks.  Positive COVID. EXAM: PORTABLE CHEST 1 VIEW COMPARISON:  12/26/2020 FINDINGS: Heart size and pulmonary vascularity are normal. Left pleural effusion or thickening, unchanged since prior study.  Possible consolidation or collapse of the left lower lung. This is also unchanged. Right lung is clear. Mediastinal contours appear intact. Calcification of the aorta. IMPRESSION: Moderate-sized chronic left pleural effusion versus pleural thickening with collapse or consolidation in the left lung base. Changes are similar to previous study. Consider underlying obstructing lesion. Right lung is clear. Electronically Signed   By: Lucienne Capers M.D.   On: 09/04/2021 19:20      Assessment/Plan  Acute hypoxia secondary to COVID-19 viral infection -Patient has been vaccinated with 2 doses of [place] but has not received booster - Admitted on 2 L via nasal cannula.  Wean as tolerated. -Start IV remdesivir and Solu-Medrol  Weakness/fall -Ambulate with a walker but continues to have numerous falls due to unsteady gait.  Previously had home PT but daughter reports has not had any due to short staffing.  She also is interested in getting him home health since wife at home has osteoporosis and has trouble caring for him. -consult PT and TOCM  Chronic left-sided consolidation -Has been noted to be present since May 2021 on a CT abdomen.  Dedicated CT chest without contrast was  obtained which shows likely large left-sided atelectasis. -Procalcitonin is pending but doubt superimposed bacterial pneumonia with no elevation in WBC and chronicity of this finding  Hypertension - Elevated today.  Daughter reports missing medication 2 days ago and today. - Continue lisinopril, Imdur  PAD - Continue Plavix  hyperlipidemia - Continue Zetia  dementia - Continue donezepil, Lexapro     DVT prophylaxis:.Lovenox Code Status: Full Family Communication: Plan discussed with daughter at bedside  disposition Plan: Home with at least 2 midnight stays  Consults called:  Admission status: inpatient  Level of care: Med-Surg  Status is: Inpatient  Remains inpatient appropriate because:Inpatient level of care appropriate due to severity of illness  Dispo: The patient is from: Home              Anticipated d/c is to: Home              Patient currently is not medically stable to d/c.   Difficult to place patient No         Orene Desanctis DO [facility]   If 7PM-7AM, please contact night-coverage [email or web address]   09/05/2021, 2:07 AM

## 2021-09-05 DIAGNOSIS — E785 Hyperlipidemia, unspecified: Secondary | ICD-10-CM

## 2021-09-05 DIAGNOSIS — L8990 Pressure ulcer of unspecified site, unspecified stage: Secondary | ICD-10-CM | POA: Insufficient documentation

## 2021-09-05 DIAGNOSIS — F0390 Unspecified dementia without behavioral disturbance: Secondary | ICD-10-CM | POA: Diagnosis not present

## 2021-09-05 DIAGNOSIS — I10 Essential (primary) hypertension: Secondary | ICD-10-CM | POA: Diagnosis not present

## 2021-09-05 DIAGNOSIS — F03918 Unspecified dementia, unspecified severity, with other behavioral disturbance: Secondary | ICD-10-CM

## 2021-09-05 DIAGNOSIS — U071 COVID-19: Secondary | ICD-10-CM | POA: Diagnosis not present

## 2021-09-05 LAB — COMPREHENSIVE METABOLIC PANEL
ALT: 16 U/L (ref 0–44)
AST: 19 U/L (ref 15–41)
Albumin: 3.7 g/dL (ref 3.5–5.0)
Alkaline Phosphatase: 76 U/L (ref 38–126)
Anion gap: 8 (ref 5–15)
BUN: 23 mg/dL (ref 8–23)
CO2: 25 mmol/L (ref 22–32)
Calcium: 9.1 mg/dL (ref 8.9–10.3)
Chloride: 105 mmol/L (ref 98–111)
Creatinine, Ser: 0.94 mg/dL (ref 0.61–1.24)
GFR, Estimated: >60 mL/min (ref >60)
Glucose, Bld: 138 mg/dL — ABNORMAL HIGH (ref 70–99)
Potassium: 4.6 mmol/L (ref 3.5–5.1)
Sodium: 138 mmol/L (ref 135–145)
Total Bilirubin: 0.4 mg/dL (ref 0.3–1.2)
Total Protein: 7.1 g/dL (ref 6.5–8.1)

## 2021-09-05 LAB — PROCALCITONIN: Procalcitonin: <0.1 ng/mL

## 2021-09-05 LAB — C-REACTIVE PROTEIN: CRP: 10.6 mg/dL — ABNORMAL HIGH (ref <1.0)

## 2021-09-05 NOTE — Evaluation (Signed)
Physical Therapy Evaluation Patient Details Name: Paul [name] MRN: [id number] DOB: 06-24-1930 Today's Date: 09/05/2021  History of Present Illness  87 y.o. male admitted 09/04/21 with increasing weakness, cough, 3 falls in past 1 week. Dx of covid. Pt with medical history significant for dementia, hx of bladder cancer in remission, HTN, PAD, HLD and depression.  Clinical Impression  Pt admitted with above diagnosis. Pt ambulated 75' with RW, no overt loss of balance but he required verbal cues to maintain safe distance from RW and min A to navigate obstacles. He is a high fall risk. Supervision with mobility recommended.  Pt currently with functional limitations due to the deficits listed below (see PT Problem List). Pt will benefit from skilled PT to increase their independence and safety with mobility to allow discharge to the venue listed below.          Recommendations for follow up therapy are one component of a multi-disciplinary discharge planning process, led by the attending physician.  Recommendations may be updated based on patient status, additional functional criteria and insurance authorization.  Follow Up Recommendations Home health PT;Supervision for mobility/OOB    Equipment Recommendations       Recommendations for Other Services       Precautions / Restrictions Precautions Precautions: Fall Precaution Comments: 3 falls in past 1 week Restrictions Weight Bearing Restrictions: No      Mobility  Bed Mobility Overal bed mobility: Modified Independent             General bed mobility comments: used rail, increased time    Transfers Overall transfer level: Needs assistance Equipment used: Rolling walker (2 wheeled) Transfers: Sit to/from Stand Sit to Stand: Min guard         General transfer comment: pt attempted to turn the walker around backwards, required assist to correct this; min/guard for safety 2* multiple recent  falls  Ambulation/Gait Ambulation/Gait assistance: Min guard Gait Distance (Feet): 60 Feet Assistive device: Rolling walker (2 wheeled) Gait Pattern/deviations: Step-through pattern;Trunk flexed;Decreased stride length Gait velocity: WFL   General Gait Details: VCs to step closer to RW, trunk flexed, verbal/manual cues and min A to negotiate obstacles  Stairs            Wheelchair Mobility    Modified Rankin (Stroke Patients Only)       Balance Overall balance assessment: Needs assistance;History of Falls Sitting-balance support: Feet supported;No upper extremity supported Sitting balance-Leahy Scale: Good     Standing balance support: Bilateral upper extremity supported Standing balance-Leahy Scale: Fair Standing balance comment: reliant upon BUE support for dynamic standing balance                             Pertinent Vitals/Pain Pain Assessment: No/denies pain    Home Living Family/patient expects to be discharged to:: Private residence Living Arrangements: Spouse/significant other Available Help at Discharge: Family;Available 24 hours/day           Home Equipment: Walker - 2 wheels;Shower seat      Prior Function Level of Independence: Needs assistance   Gait / Transfers Assistance Needed: walks with RW  ADL's / Homemaking Assistance Needed: son assists with showering        Hand Dominance        Extremity/Trunk Assessment   Upper Extremity Assessment Upper Extremity Assessment: Overall WFL for tasks assessed    Lower Extremity Assessment Lower Extremity Assessment: Overall WFL for tasks assessed  Cervical / Trunk Assessment Cervical / Trunk Assessment: Kyphotic  Communication   Communication: HOH  Cognition Arousal/Alertness: Awake/alert Behavior During Therapy: WFL for tasks assessed/performed Overall Cognitive Status: No family/caregiver present to determine baseline cognitive functioning                                  General Comments: pleasantly confused, oriented to self and location, not to month/year; follows commands, decr short term memory (I told pt we couldn't go in the hallway but he kept attempting to go out his door)      General Comments      Exercises     Assessment/Plan    PT Assessment Patient needs continued PT services  PT Problem List Decreased activity tolerance;Decreased balance;Decreased mobility       PT Treatment Interventions DME instruction;Therapeutic activities;Gait training;Therapeutic exercise;Functional mobility training    PT Goals (Current goals can be found in the Care Plan section)  Acute Rehab PT Goals Patient Stated Goal: pt stated he likes to fly planes PT Goal Formulation: With patient Time For Goal Achievement: 09/19/21 Potential to Achieve Goals: Good    Frequency Min 3X/week   Barriers to discharge        Co-evaluation               AM-PAC PT "6 Clicks" Mobility  Outcome Measure Help needed turning from your back to your side while in a flat bed without using bedrails?: None Help needed moving from lying on your back to sitting on the side of a flat bed without using bedrails?: A Little Help needed moving to and from a bed to a chair (including a wheelchair)?: A Little Help needed standing up from a chair using your arms (e.g., wheelchair or bedside chair)?: None Help needed to walk in hospital room?: A Little Help needed climbing 3-5 steps with a railing? : A Little 6 Click Score: 20    End of Session Equipment Utilized During Treatment: Gait belt Activity Tolerance: Patient tolerated treatment well Patient left: in bed;with call bell/phone within reach;with bed alarm set Nurse Communication: Mobility status PT Visit Diagnosis: Difficulty in walking, not elsewhere classified (R26.2);History of falling (Z91.81);Other abnormalities of gait and mobility (R26.89)    Time: 8469-6295 PT Time Calculation (min)  (ACUTE ONLY): 17 min   Charges:   PT Evaluation $PT Eval Moderate Complexity: 1 Mod         Philomena Doheny PT 09/05/2021  Acute Rehabilitation Services Pager [phone] Office [phone]

## 2021-09-05 NOTE — Progress Notes (Signed)
PROGRESS NOTE  Paul [name]  FYB:[id number] DOB: December 25, 1929 DOA: 09/04/2021 PCP: Crist Infante, MD  Brief Narrative: Paul [name] is a 87 y.o. male with medical history significant for dementia, hx of bladder cancer in remission, HTN, PAD, HLD and depression who presents with increasing weakness and cough.    Patient reports that he has been feeling weak for the past month but otherwise unable to provide further history given dementia.  Daughter at bedside helps with history.  She reports that over the past year he has had numerous falls due to unsteady gait.  For the past week he has fallen about 3 times due to weakness in lower extremity.  Normally ambulates with a walker.  For the past several days had increased weakness and cough.  Son who also helps to care for him tested positive for COVID this weekend.  He has received 2 doses of Pfizer but has not received booster.  He then tested positive for COVID at primary care office and was asked to present for further evaluation in the ED.   ED Course: He had temperature of 99.1, blood pressure systolic of 527P over 82U.  CBC showed no leukocytosis, hemoglobin of 12.3.  BMP otherwise unremarkable.  CT head negative. Chest x-ray shows chronic left-sided pleural effusion.  Assessment & Plan: Principal Problem:   COVID-19 virus infection Active Problems:   Hypertension   PAD (peripheral artery disease) (HCC)   Pressure injury of skin   HLD (hyperlipidemia)   Dementia without behavioral disturbance ([place])  Acute hypoxic respiratory failure due to covid-19 infection: s/p 2 doses Pfizer vaccine in Feb and March 2021. CRP elevated at 10.6.  - Continue remdesivir (9/21 >>)  - Continue low dose solumedrol - Wean oxygen as tolerated - 10 days isolation recommended  Weakness, falls: Acute worsening of chronic fall risk due to covid infection.  - HHPT recommended currently.   PAD, also with moderate CAD on CT, HLD: No claudication or angina at  this time. - Continue plavix, zetia (statin allergy)  Dementia:  - Continue aricept  Depression: Quiescent.  - Continue SSRI  HTN:  - continue lisinopril, imdur.  Left-sided opacity: Most consistent with atelectasis given chronicity (noted as early as May 2021) and negative PCT. - Will defer further work up at this time.  - Incentive spirometry  Bladder cancer: In remission.   Right buttock stage 2 pressure injury POA: Offload, local wound care.   DVT prophylaxis: Lovenox Code Status: Full Family Communication: Daughter by phone.  Disposition Plan:  Status is: Inpatient  Remains inpatient appropriate because:Inpatient level of care appropriate due to severity of illness  Dispo: The patient is from: Home              Anticipated d/c is to: Home with home health services. Of note, also has a pending referral to outpatient palliative with Manufacturing engineer.               Patient currently is not medically stable to d/c.   Consultants:  None  Procedures:  None  Antimicrobials: Remdesivir   Subjective: Pt is confused but conversant reporting no chest pain or dyspnea at this time.   Objective: Vitals:   09/04/21 2300 09/04/21 2330 09/04/21 2351 09/05/21 0801  BP: (!) 149/76 (!) 167/77 (!) 150/73 (!) 167/74  Pulse:  71 73 67  Resp: (!) 21 20 20 20   Temp:   99.4 F (37.4 C) 98.5 F (36.9 C)  TempSrc:   Oral Oral  SpO2:  95% 95% 96%  Weight:   65.4 kg   Height:   5\' 8"  (1.727 m)     Intake/Output Summary (Last 24 hours) at 09/05/2021 1457 Last data filed at 09/05/2021 1039 Gross per 24 hour  Intake 440 ml  Output 200 ml  Net 240 ml   Filed Weights   09/04/21 1747 09/04/21 2351  Weight: 68 kg 65.4 kg    Gen: 87 y.o. male in no distress Pulm: Tachypneic at rest with crackles on L. Elsewhere clear to auscultation bilaterally.  CV: Regular rate and rhythm. No murmur, rub, or gallop. No JVD, no pitting pedal edema. GI: Abdomen soft, non-tender,  non-distended, with normoactive bowel sounds. No organomegaly or masses felt. Ext: Warm, no deformities Skin: No rashes, lesions or ulcers Neuro: Alert and incompletely oriented. No focal neurological deficits. Psych: Judgement and insight appear impaired, but interactive and conversant. Mood & affect appropriate.   Data Reviewed: I have personally reviewed following labs and imaging studies  CBC: Recent Labs  Lab 09/04/21 1814  WBC 5.2  NEUTROABS 3.5  HGB 12.3*  HCT 39.3  MCV 91.8  PLT 546   Basic Metabolic Panel: Recent Labs  Lab 09/04/21 1814 09/05/21 0400  NA 138 138  K 4.6 4.6  CL 102 105  CO2 24 25  GLUCOSE 91 138*  BUN 22 23  CREATININE 1.07 0.94  CALCIUM 9.1 9.1   GFR: Estimated Creatinine Clearance: 49.3 mL/min (by C-G formula based on SCr of 0.94 mg/dL). Liver Function Tests: Recent Labs  Lab 09/04/21 1814 09/05/21 0400  AST 20 19  ALT 17 16  ALKPHOS 79 76  BILITOT 0.6 0.4  PROT 7.4 7.1  ALBUMIN 4.0 3.7   No results for input(s): LIPASE, AMYLASE in the last 168 hours. No results for input(s): AMMONIA in the last 168 hours. Coagulation Profile: No results for input(s): INR, PROTIME in the last 168 hours. Cardiac Enzymes: No results for input(s): CKTOTAL, CKMB, CKMBINDEX, TROPONINI in the last 168 hours. BNP (last 3 results) No results for input(s): PROBNP in the last 8760 hours. HbA1C: No results for input(s): HGBA1C in the last 72 hours. CBG: No results for input(s): GLUCAP in the last 168 hours. Lipid Profile: No results for input(s): CHOL, HDL, LDLCALC, TRIG, CHOLHDL, LDLDIRECT in the last 72 hours. Thyroid Function Tests: No results for input(s): TSH, T4TOTAL, FREET4, T3FREE, THYROIDAB in the last 72 hours. Anemia Panel: No results for input(s): VITAMINB12, FOLATE, FERRITIN, TIBC, IRON, RETICCTPCT in the last 72 hours. Urine analysis:    Component Value Date/Time   COLORURINE YELLOW 09/04/2021 2120   APPEARANCEUR CLEAR 09/04/2021 2120    LABSPEC 1.020 09/04/2021 2120   PHURINE 6.0 09/04/2021 2120   GLUCOSEU NEGATIVE 09/04/2021 2120   HGBUR TRACE (A) 09/04/2021 2120   BILIRUBINUR NEGATIVE 09/04/2021 2120   KETONESUR TRACE (A) 09/04/2021 2120   PROTEINUR 30 (A) 09/04/2021 2120   NITRITE NEGATIVE 09/04/2021 2120   LEUKOCYTESUR NEGATIVE 09/04/2021 2120   No results found for this or any previous visit (from the past 240 hour(s)).    Radiology Studies: CT HEAD WO CONTRAST (5MM)  Result Date: 09/04/2021 CLINICAL DATA:  Head trauma. EXAM: CT HEAD WITHOUT CONTRAST TECHNIQUE: Contiguous axial images were obtained from the base of the skull through the vertex without intravenous contrast. COMPARISON:  Head CT dated 06/29/2020 FINDINGS: Brain: Mild age-related atrophy and chronic microvascular ischemic changes. There is no acute intracranial hemorrhage. No mass effect or midline shift. No extra-axial fluid collection.  Vascular: No hyperdense vessel or unexpected calcification. Skull: Normal. Negative for fracture or focal lesion. Sinuses/Orbits: No acute finding. Other: None IMPRESSION: 1. No acute intracranial pathology. 2. Mild age-related atrophy and chronic microvascular ischemic changes. Electronically Signed   By: Anner Crete M.D.   On: 09/04/2021 20:26   CT CHEST WO CONTRAST  Result Date: 09/04/2021 CLINICAL DATA:  COVID positive with shortness of breath and history of chronic left-sided pleural effusion since May 2021. EXAM: CT CHEST WITHOUT CONTRAST TECHNIQUE: Multidetector CT imaging of the chest was performed following the standard protocol without IV contrast. COMPARISON:  None. FINDINGS: Cardiovascular: There is marked severity calcification of the aortic arch and descending thoracic aorta, without evidence of aneurysmal dilatation. Normal heart size with moderate severity coronary artery calcification. No pericardial effusion. Mediastinum/Nodes: No enlarged mediastinal or axillary lymph nodes. Thyroid gland, trachea,  and esophagus demonstrate no significant findings. Lungs/Pleura: Moderate severity consolidation is seen within the left lower lobe. Mild to moderate severity posterior right lower lobe linear scarring and/or atelectasis is noted. A small, partially loculated left pleural effusion is seen. No pneumothorax is identified. Upper Abdomen: No acute abnormality. Musculoskeletal: A chronic posterior ninth left rib deformity is seen. Multilevel degenerative changes seen throughout the thoracic spine. IMPRESSION: 1. Moderate severity left lower lobe consolidation consistent with an extensive amount of atelectasis and/or infiltrate. 2. Small, partially loculated left pleural effusion. 3. Mild to moderate severity posterior right lower lobe linear scarring and/or atelectasis. 4. Moderate severity coronary artery disease. 5. Aortic atherosclerosis. Aortic Atherosclerosis (ICD10-I70.0). Electronically Signed   By: Virgina Norfolk M.D.   On: 09/04/2021 23:39   DG Chest Port 1 View  Result Date: 09/04/2021 CLINICAL DATA:  Weak and tired for a few weeks.  Positive COVID. EXAM: PORTABLE CHEST 1 VIEW COMPARISON:  12/26/2020 FINDINGS: Heart size and pulmonary vascularity are normal. Left pleural effusion or thickening, unchanged since prior study. Possible consolidation or collapse of the left lower lung. This is also unchanged. Right lung is clear. Mediastinal contours appear intact. Calcification of the aorta. IMPRESSION: Moderate-sized chronic left pleural effusion versus pleural thickening with collapse or consolidation in the left lung base. Changes are similar to previous study. Consider underlying obstructing lesion. Right lung is clear. Electronically Signed   By: Lucienne Capers M.D.   On: 09/04/2021 19:20    Scheduled Meds:  clopidogrel  75 mg Oral Daily   donepezil  10 mg Oral QHS   enoxaparin (LOVENOX) injection  40 mg Subcutaneous Q24H   escitalopram  20 mg Oral Daily   ezetimibe  10 mg Oral Daily    isosorbide mononitrate  60 mg Oral q morning   lisinopril  10 mg Oral Daily   methylPREDNISolone (SOLU-MEDROL) injection  40 mg Intravenous Daily   Continuous Infusions:  remdesivir 100 mg in NS 100 mL 100 mg (09/05/21 1035)     LOS: 1 day   Time spent: 25 minutes.  Patrecia Pour, MD [facility] [email or web address] 09/05/2021, 2:57 PM

## 2021-09-05 NOTE — Progress Notes (Signed)
[facility]) Hospital Liaison note:  This is a pending outpatient-based Palliative Care patient. Will continue to follow for disposition.  Please call with any outpatient palliative questions or concerns.  Thank you, Lorelee Market, LPN [facility] Liaison [phone]

## 2021-09-05 NOTE — TOC Progression Note (Signed)
Transition of Care [facility]) - Progression Note    Patient Details  Name: Paul [name] MRN: [id number] Date of Birth: 1930-01-06  Transition of Care [facility]) CM/SW Contact  Purcell Mouton, RN Phone Number: 09/05/2021, 2:38 PM  Clinical Narrative:    Pt from home with his wife. Tried calling pt's wife with no answer, concerning discharge plans.    Expected Discharge Plan: [place] Barriers to Discharge: No Barriers Identified  Expected Discharge Plan and Services Expected Discharge Plan: [place] arrangements for the past 2 months: Single Family Home                                       Social Determinants of Health (SDOH) Interventions    Readmission Risk Interventions No flowsheet data found.

## 2021-09-06 DIAGNOSIS — I10 Essential (primary) hypertension: Secondary | ICD-10-CM | POA: Diagnosis not present

## 2021-09-06 DIAGNOSIS — U071 COVID-19: Secondary | ICD-10-CM | POA: Diagnosis not present

## 2021-09-06 DIAGNOSIS — E785 Hyperlipidemia, unspecified: Secondary | ICD-10-CM | POA: Diagnosis not present

## 2021-09-06 DIAGNOSIS — F0390 Unspecified dementia without behavioral disturbance: Secondary | ICD-10-CM | POA: Diagnosis not present

## 2021-09-06 LAB — URINE CULTURE

## 2021-09-06 LAB — C-REACTIVE PROTEIN: CRP: 7.3 mg/dL — ABNORMAL HIGH (ref <1.0)

## 2021-09-06 MED ORDER — HALOPERIDOL LACTATE 5 MG/ML IJ SOLN
2.5000 mg | Freq: Once | INTRAMUSCULAR | Status: AC
  Administered 2021-09-06: 2.5 mg via INTRAMUSCULAR
  Filled 2021-09-06: qty 1

## 2021-09-06 NOTE — Progress Notes (Signed)
PROGRESS NOTE  Paul [name]  OHY:[id number] DOB: [birth year] DOA: 09/04/2021 PCP: Crist Infante, MD  Brief Narrative: Paul [name] is a 87 y.o. male with medical history significant for dementia, hx of bladder cancer in remission, HTN, PAD, HLD and depression who presents with increasing weakness and cough.    Patient reports that he has been feeling weak for the past month but otherwise unable to provide further history given dementia.  Daughter at bedside helps with history.  She reports that over the past year he has had numerous falls due to unsteady gait.  For the past week he has fallen about 3 times due to weakness in lower extremity.  Normally ambulates with a walker.  For the past several days had increased weakness and cough.  Son who also helps to care for him tested positive for COVID this weekend.  He has received 2 doses of Pfizer but has not received booster.  He then tested positive for COVID at primary care office and was asked to present for further evaluation in the ED.   ED Course: He had temperature of 99.1, blood pressure systolic of 948N over 46E.  CBC showed no leukocytosis, hemoglobin of 12.3.  BMP otherwise unremarkable.  CT head negative. Chest x-ray shows chronic left-sided pleural effusion.  Assessment & Plan: Principal Problem:   COVID-19 virus infection Active Problems:   Hypertension   PAD (peripheral artery disease) (HCC)   Pressure injury of skin   HLD (hyperlipidemia)   Dementia without behavioral disturbance ([place])  Acute hypoxic respiratory failure due to covid-19 infection: s/p 2 doses Pfizer vaccine in Feb and March 2021. CRP improving but remains grossly elevated in this 87yo M. - Continue remdesivir (9/21 >>)  - Continue low dose solumedrol - 10 days isolation recommended  Weakness, falls: Acute worsening of chronic fall risk due to covid infection.  - HHPT recommended currently.  - Continue OOB as much as possible.  PAD, also with moderate  CAD on CT, HLD: No claudication or angina at this time. - Continue plavix, zetia (statin allergy)  Dementia:  - Continue aricept  Depression: Quiescent.  - Continue SSRI  HTN:  - Continue lisinopril, imdur.  Left-sided opacity: Most consistent with atelectasis given chronicity (noted as early as May 2021) and negative PCT. - Will defer further work up at this time.  - Incentive spirometry  Bladder cancer: In remission.   Right buttock stage 2 pressure injury POA: Offload, local wound care.   DVT prophylaxis: Lovenox Code Status: Full Family Communication: Daughter by phone.  Disposition Plan:  Status is: Inpatient  Remains inpatient appropriate because:Inpatient level of care appropriate due to severity of illness  Dispo: The patient is from: Home              Anticipated d/c is to: Home with home health services. Of note, also has a pending referral to outpatient palliative with Manufacturing engineer.               Patient currently is not medically stable to d/c.   Consultants:  None  Procedures:  None  Antimicrobials: Remdesivir 9/21 >>  Subjective: No fevers, chills, feels weakness is improving. Mild shortness of breath with exertion without chest pain or other new concern. Has only gotten 2 doses of remdesivir  Objective: Vitals:   09/05/21 1700 09/05/21 2100 09/06/21 0632 09/06/21 1229  BP: 125/62 128/66 131/64 132/65  Pulse: 80 64 (!) 58 (!) 59  Resp: 18 16 20  16  Temp: 98.5 F (36.9 C) 98.4 F (36.9 C) 97.8 F (36.6 C) 98.3 F (36.8 C)  TempSrc: Oral Oral  Oral  SpO2: 97% 94% 97% 96%  Weight:      Height:        Intake/Output Summary (Last 24 hours) at 09/06/2021 1533 Last data filed at 09/06/2021 1003 Gross per 24 hour  Intake 480 ml  Output 700 ml  Net -220 ml   Filed Weights   09/04/21 1747 09/04/21 2351  Weight: 68 kg 65.4 kg   Gen: Elderly male in no distress Pulm: Nonlabored breathing room air. Clear. CV: Regular rate and rhythm.  No murmur, rub, or gallop. No JVD, no pitting dependent edema. GI: Abdomen soft, non-tender, non-distended, with normoactive bowel sounds.  Ext: Warm, no deformities Skin: No new rashes, lesions or ulcers on visualized skin. Neuro: Alert and incompletely oriented without focal neurological deficits. Psych: Judgement and insight appear impaired. Mood euthymic & affect congruent. Behavior is appropriate.    Data Reviewed: I have personally reviewed following labs and imaging studies  CBC: Recent Labs  Lab 09/04/21 1814  WBC 5.2  NEUTROABS 3.5  HGB 12.3*  HCT 39.3  MCV 91.8  PLT 295   Basic Metabolic Panel: Recent Labs  Lab 09/04/21 1814 09/05/21 0400  NA 138 138  K 4.6 4.6  CL 102 105  CO2 24 25  GLUCOSE 91 138*  BUN 22 23  CREATININE 1.07 0.94  CALCIUM 9.1 9.1   GFR: Estimated Creatinine Clearance: 49.3 mL/min (by C-G formula based on SCr of 0.94 mg/dL). Liver Function Tests: Recent Labs  Lab 09/04/21 1814 09/05/21 0400  AST 20 19  ALT 17 16  ALKPHOS 79 76  BILITOT 0.6 0.4  PROT 7.4 7.1  ALBUMIN 4.0 3.7   No results for input(s): LIPASE, AMYLASE in the last 168 hours. No results for input(s): AMMONIA in the last 168 hours. Coagulation Profile: No results for input(s): INR, PROTIME in the last 168 hours. Cardiac Enzymes: No results for input(s): CKTOTAL, CKMB, CKMBINDEX, TROPONINI in the last 168 hours. BNP (last 3 results) No results for input(s): PROBNP in the last 8760 hours. HbA1C: No results for input(s): HGBA1C in the last 72 hours. CBG: No results for input(s): GLUCAP in the last 168 hours. Lipid Profile: No results for input(s): CHOL, HDL, LDLCALC, TRIG, CHOLHDL, LDLDIRECT in the last 72 hours. Thyroid Function Tests: No results for input(s): TSH, T4TOTAL, FREET4, T3FREE, THYROIDAB in the last 72 hours. Anemia Panel: No results for input(s): VITAMINB12, FOLATE, FERRITIN, TIBC, IRON, RETICCTPCT in the last 72 hours. Urine analysis:    Component  Value Date/Time   COLORURINE YELLOW 09/04/2021 2120   APPEARANCEUR CLEAR 09/04/2021 2120   LABSPEC 1.020 09/04/2021 2120   PHURINE 6.0 09/04/2021 2120   GLUCOSEU NEGATIVE 09/04/2021 2120   HGBUR TRACE (A) 09/04/2021 2120   BILIRUBINUR NEGATIVE 09/04/2021 2120   KETONESUR TRACE (A) 09/04/2021 2120   PROTEINUR 30 (A) 09/04/2021 2120   NITRITE NEGATIVE 09/04/2021 2120   LEUKOCYTESUR NEGATIVE 09/04/2021 2120   Recent Results (from the past 240 hour(s))  Urine Culture     Status: Abnormal   Collection Time: 09/04/21  9:21 PM   Specimen: Urine, Clean Catch  Result Value Ref Range Status   Specimen Description   Final    URINE, CLEAN CATCH Performed at [facility], [place] [address], [place], [place] [address]    Special Requests   Final    NONE Performed at [facility]  [place] [address], [place], [place] [address]    Culture MULTIPLE SPECIES PRESENT, SUGGEST RECOLLECTION (A)  Final   Report Status 09/06/2021 FINAL  Final      Radiology Studies: CT HEAD WO CONTRAST (5MM)  Result Date: 09/04/2021 CLINICAL DATA:  Head trauma. EXAM: CT HEAD WITHOUT CONTRAST TECHNIQUE: Contiguous axial images were obtained from the base of the skull through the vertex without intravenous contrast. COMPARISON:  Head CT dated 06/29/2020 FINDINGS: Brain: Mild age-related atrophy and chronic microvascular ischemic changes. There is no acute intracranial hemorrhage. No mass effect or midline shift. No extra-axial fluid collection. Vascular: No hyperdense vessel or unexpected calcification. Skull: Normal. Negative for fracture or focal lesion. Sinuses/Orbits: No acute finding. Other: None IMPRESSION: 1. No acute intracranial pathology. 2. Mild age-related atrophy and chronic microvascular ischemic changes. Electronically Signed   By: Anner Crete M.D.   On: 09/04/2021 20:26   CT CHEST WO CONTRAST  Result Date: 09/04/2021 CLINICAL DATA:  COVID positive with shortness of  breath and history of chronic left-sided pleural effusion since May 2021. EXAM: CT CHEST WITHOUT CONTRAST TECHNIQUE: Multidetector CT imaging of the chest was performed following the standard protocol without IV contrast. COMPARISON:  None. FINDINGS: Cardiovascular: There is marked severity calcification of the aortic arch and descending thoracic aorta, without evidence of aneurysmal dilatation. Normal heart size with moderate severity coronary artery calcification. No pericardial effusion. Mediastinum/Nodes: No enlarged mediastinal or axillary lymph nodes. Thyroid gland, trachea, and esophagus demonstrate no significant findings. Lungs/Pleura: Moderate severity consolidation is seen within the left lower lobe. Mild to moderate severity posterior right lower lobe linear scarring and/or atelectasis is noted. A small, partially loculated left pleural effusion is seen. No pneumothorax is identified. Upper Abdomen: No acute abnormality. Musculoskeletal: A chronic posterior ninth left rib deformity is seen. Multilevel degenerative changes seen throughout the thoracic spine. IMPRESSION: 1. Moderate severity left lower lobe consolidation consistent with an extensive amount of atelectasis and/or infiltrate. 2. Small, partially loculated left pleural effusion. 3. Mild to moderate severity posterior right lower lobe linear scarring and/or atelectasis. 4. Moderate severity coronary artery disease. 5. Aortic atherosclerosis. Aortic Atherosclerosis (ICD10-I70.0). Electronically Signed   By: Virgina Norfolk M.D.   On: 09/04/2021 23:39   DG Chest Port 1 View  Result Date: 09/04/2021 CLINICAL DATA:  Weak and tired for a few weeks.  Positive COVID. EXAM: PORTABLE CHEST 1 VIEW COMPARISON:  12/26/2020 FINDINGS: Heart size and pulmonary vascularity are normal. Left pleural effusion or thickening, unchanged since prior study. Possible consolidation or collapse of the left lower lung. This is also unchanged. Right lung is clear.  Mediastinal contours appear intact. Calcification of the aorta. IMPRESSION: Moderate-sized chronic left pleural effusion versus pleural thickening with collapse or consolidation in the left lung base. Changes are similar to previous study. Consider underlying obstructing lesion. Right lung is clear. Electronically Signed   By: Lucienne Capers M.D.   On: 09/04/2021 19:20    Scheduled Meds:  clopidogrel  75 mg Oral Daily   donepezil  10 mg Oral QHS   enoxaparin (LOVENOX) injection  40 mg Subcutaneous Q24H   escitalopram  20 mg Oral Daily   ezetimibe  10 mg Oral Daily   isosorbide mononitrate  60 mg Oral q morning   lisinopril  10 mg Oral Daily   methylPREDNISolone (SOLU-MEDROL) injection  40 mg Intravenous Daily   Continuous Infusions:  remdesivir 100 mg in NS 100 mL 100 mg (09/06/21 1032)     LOS: 2  days   Time spent: 25 minutes.  Patrecia Pour, MD [facility] [email or web address] 09/06/2021, 3:33 PM

## 2021-09-06 NOTE — Progress Notes (Signed)
HOSPITAL MEDICINE OVERNIGHT EVENT NOTE    Notified by nursing that patient is becoming increasingly confused and intermittently agitated, attempting to get out of bed and pulling out his IV.  Patient has a known history of dementia and is currently hospitalized for COVID-19.  Agitation/delirium is likely exacerbated by acute illness in the setting of known history of dementia.    Nursing confirmed the patient is not in pain, does not have a fever and does not have an indwelling Foley catheter.  We will administer a one-time dose of 2.5 mg of IM Haldol and attempt to get patient to comply with care plan and remain in bed.  QTC confirmed to be 404 ms.  Patient is currently being monitored on telemetry.  Vernelle Emerald  MD [facility]

## 2021-09-07 DIAGNOSIS — U071 COVID-19: Secondary | ICD-10-CM | POA: Diagnosis not present

## 2021-09-07 DIAGNOSIS — F0390 Unspecified dementia without behavioral disturbance: Secondary | ICD-10-CM | POA: Diagnosis not present

## 2021-09-07 DIAGNOSIS — E785 Hyperlipidemia, unspecified: Secondary | ICD-10-CM | POA: Diagnosis not present

## 2021-09-07 DIAGNOSIS — I10 Essential (primary) hypertension: Secondary | ICD-10-CM | POA: Diagnosis not present

## 2021-09-07 LAB — C-REACTIVE PROTEIN: CRP: 4.6 mg/dL — ABNORMAL HIGH (ref <1.0)

## 2021-09-07 NOTE — Discharge Summary (Signed)
Physician Discharge Summary  Paul [name] ELF:[id number] DOB: 09-May-1930 DOA: 09/04/2021  PCP: Crist Infante, MD  Admit date: 09/04/2021 Discharge date: 09/07/2021  Admitted From: Home Disposition: Home   Recommendations for Outpatient Follow-up:  Follow up with PCP following 10 day isolation period. Recommend to continue following through with outpatient palliative care referral. Consider follow up imaging of chronic left lung findings (see below for details)  Home Health: PT, OT.  Equipment/Devices: Rolling walker at home Discharge Condition: Stable CODE STATUS: Full Diet recommendation: Heart healthy  Brief/Interim Summary: Paul [name] is a 87 y.o. male with medical history significant for dementia, hx of bladder cancer in remission, HTN, PAD, HLD and depression who presents with increasing weakness and cough.    Patient reports that he has been feeling weak for the past month but otherwise unable to provide further history given dementia.  Daughter at bedside helps with history.  She reports that over the past year he has had numerous falls due to unsteady gait.  For the past week he has fallen about 3 times due to weakness in lower extremity.  Normally ambulates with a walker.  For the past several days had increased weakness and cough.  Son who also helps to care for him tested positive for COVID this weekend.  He has received 2 doses of Pfizer but has not received booster.  He then tested positive for COVID at primary care office and was asked to present for further evaluation in the ED.   ED Course: He had temperature of 99.1, blood pressure systolic of 585I over 77O.  CBC showed no leukocytosis, hemoglobin of 12.3.  BMP otherwise unremarkable.  CT head negative. Chest x-ray shows chronic left-sided pleural effusion without acute infiltrate. CRP was elevated to 10.6 though PCT negative.  Hospital Course: Mr. Presti was admitted, started on remdesivir. due to reports of hypoxia  in the ED, steroids were given, though hypoxia rapidly resolved. He has received 4 doses of remdesivir and solumedrol with sustained diminution of inflammatory markers and clinical stability. His functional mobility has improved from PTA, though he remains a fall risk and will have home health therapies for rehabilitation at home. As anticipated, he suffered some sundowning that seemed to progressively worsen during hospitalization. On 9/24 he is stable for discharge, having maximized benefit of inpatient management.   Discharge Diagnoses:  Principal Problem:   COVID-19 virus infection Active Problems:   Hypertension   PAD (peripheral artery disease) (HCC)   Pressure injury of skin   HLD (hyperlipidemia)   Dementia without behavioral disturbance (HCC)  Covid-19 infection: Pt was reportedly hypoxic in the ED requiring 2L O2 though this rapidly resolved and no hypoxic readings are charted. He was treated with steroids because of this, but will not require steroids at discharge. s/p 2 doses Pfizer vaccine in Feb and March 2021. CRP improved s/p remdesivir x4 doses and he is stable for discharge.  - 10 days isolation recommended   Weakness, falls: Acute worsening of chronic fall risk due to covid infection.  - HHPT and OT will be arranged.  - Continue OOB as much as possible.   PAD, also with moderate CAD on CT, HLD: No claudication or angina at this time. - Continue plavix, zetia (statin allergy)   Dementia with acute hospital delirium: Required IM haldol overnight for patient safety. This poses a significant continued risk to the patient by prolonging hospitalization:  - Continue aricept, anticipate improvement in familiar setting at home.   Depression:  Quiescent.  - Continue SSRI   HTN:  - Continue lisinopril, imdur.   Left-sided opacity: Most consistent with atelectasis given chronicity (noted as early as May 2021) and negative PCT. - Will defer further work up at this time.  -  Incentive spirometry   Bladder cancer: In remission.    Right buttock stage 2 pressure injury POA: Offload, local wound care.   Discharge Instructions  Allergies as of 09/07/2021       Reactions   Livalo [pitavastatin] Other (See Comments)   Cramping and back pain   Livalo [pitavastatin] Other (See Comments)   Cramping and back pain   Other Diarrhea, Other (See Comments)   Unnamed patch and tablets for dementia caused diarrhea (Possibly galantamine, from outside source)         Medication List     TAKE these medications    acetaminophen 500 MG tablet Commonly known as: TYLENOL Take 500 mg by mouth daily as needed for mild pain or headache.   clopidogrel 75 MG tablet Commonly known as: PLAVIX Take 75 mg by mouth daily.   donepezil 10 MG tablet Commonly known as: ARICEPT Take 10 mg by mouth at bedtime.   escitalopram 20 MG tablet Commonly known as: LEXAPRO Take 20 mg by mouth daily.   ezetimibe 10 MG tablet Commonly known as: ZETIA Take 10 mg by mouth daily.   isosorbide mononitrate 60 MG 24 hr tablet Commonly known as: IMDUR Take 60 mg by mouth every morning.   lisinopril 10 MG tablet Commonly known as: ZESTRIL Take 10 mg by mouth daily. What changed: Another medication with the same name was removed. Continue taking this medication, and follow the directions you see here.        Follow-up Information     Crist Infante, MD Follow up.   Specialty: Internal Medicine Contact information: [place] [address] [phone]                Allergies  Allergen Reactions   Livalo [Pitavastatin] Other (See Comments)    Cramping and back pain   Livalo [Pitavastatin] Other (See Comments)    Cramping and back pain   Other Diarrhea and Other (See Comments)    Unnamed patch and tablets for dementia caused diarrhea (Possibly galantamine, from outside source)     Consultations: None  Procedures/Studies: CT HEAD WO CONTRAST  (5MM)  Result Date: 09/04/2021 CLINICAL DATA:  Head trauma. EXAM: CT HEAD WITHOUT CONTRAST TECHNIQUE: Contiguous axial images were obtained from the base of the skull through the vertex without intravenous contrast. COMPARISON:  Head CT dated 06/29/2020 FINDINGS: Brain: Mild age-related atrophy and chronic microvascular ischemic changes. There is no acute intracranial hemorrhage. No mass effect or midline shift. No extra-axial fluid collection. Vascular: No hyperdense vessel or unexpected calcification. Skull: Normal. Negative for fracture or focal lesion. Sinuses/Orbits: No acute finding. Other: None IMPRESSION: 1. No acute intracranial pathology. 2. Mild age-related atrophy and chronic microvascular ischemic changes. Electronically Signed   By: Anner Crete M.D.   On: 09/04/2021 20:26   CT CHEST WO CONTRAST  Result Date: 09/04/2021 CLINICAL DATA:  COVID positive with shortness of breath and history of chronic left-sided pleural effusion since May 2021. EXAM: CT CHEST WITHOUT CONTRAST TECHNIQUE: Multidetector CT imaging of the chest was performed following the standard protocol without IV contrast. COMPARISON:  None. FINDINGS: Cardiovascular: There is marked severity calcification of the aortic arch and descending thoracic aorta, without evidence of aneurysmal dilatation. Normal heart size  with moderate severity coronary artery calcification. No pericardial effusion. Mediastinum/Nodes: No enlarged mediastinal or axillary lymph nodes. Thyroid gland, trachea, and esophagus demonstrate no significant findings. Lungs/Pleura: Moderate severity consolidation is seen within the left lower lobe. Mild to moderate severity posterior right lower lobe linear scarring and/or atelectasis is noted. A small, partially loculated left pleural effusion is seen. No pneumothorax is identified. Upper Abdomen: No acute abnormality. Musculoskeletal: A chronic posterior ninth left rib deformity is seen. Multilevel degenerative  changes seen throughout the thoracic spine. IMPRESSION: 1. Moderate severity left lower lobe consolidation consistent with an extensive amount of atelectasis and/or infiltrate. 2. Small, partially loculated left pleural effusion. 3. Mild to moderate severity posterior right lower lobe linear scarring and/or atelectasis. 4. Moderate severity coronary artery disease. 5. Aortic atherosclerosis. Aortic Atherosclerosis (ICD10-I70.0). Electronically Signed   By: Virgina Norfolk M.D.   On: 09/04/2021 23:39   DG Chest Port 1 View  Result Date: 09/04/2021 CLINICAL DATA:  Weak and tired for a few weeks.  Positive COVID. EXAM: PORTABLE CHEST 1 VIEW COMPARISON:  12/26/2020 FINDINGS: Heart size and pulmonary vascularity are normal. Left pleural effusion or thickening, unchanged since prior study. Possible consolidation or collapse of the left lower lung. This is also unchanged. Right lung is clear. Mediastinal contours appear intact. Calcification of the aorta. IMPRESSION: Moderate-sized chronic left pleural effusion versus pleural thickening with collapse or consolidation in the left lung base. Changes are similar to previous study. Consider underlying obstructing lesion. Right lung is clear. Electronically Signed   By: Lucienne Capers M.D.   On: 09/04/2021 19:20     Subjective: Sitting in chair, got up with RN supervision to bathroom today. Had agitation, pulled out IV and was not redirectable last night prompting haldol IM administration. Calm and conversant today but remains confused. Denies dyspnea or any pain.  Discharge Exam: Vitals:   09/06/21 2101 09/07/21 0620  BP: (!) 160/85 (!) 141/57  Pulse: 71 69  Resp: 20 20  Temp: 98.4 F (36.9 C) 97.6 F (36.4 C)  SpO2: 97% 97%   General: Awake, alert, conversant, sitting in chair today Cardiovascular: RRR, S1/S2 +, no rubs, no gallops Respiratory: Nonlabored and clear Abdominal: Soft, NT, ND, bowel sounds + Extremities: No edema, no cyanosis Neuro:  Alert, disoriented without focal deficit.  Labs: BNP (last 3 results) No results for input(s): BNP in the last 8760 hours. Basic Metabolic Panel: Recent Labs  Lab 09/04/21 1814 09/05/21 0400  NA 138 138  K 4.6 4.6  CL 102 105  CO2 24 25  GLUCOSE 91 138*  BUN 22 23  CREATININE 1.07 0.94  CALCIUM 9.1 9.1   Liver Function Tests: Recent Labs  Lab 09/04/21 1814 09/05/21 0400  AST 20 19  ALT 17 16  ALKPHOS 79 76  BILITOT 0.6 0.4  PROT 7.4 7.1  ALBUMIN 4.0 3.7   No results for input(s): LIPASE, AMYLASE in the last 168 hours. No results for input(s): AMMONIA in the last 168 hours. CBC: Recent Labs  Lab 09/04/21 1814  WBC 5.2  NEUTROABS 3.5  HGB 12.3*  HCT 39.3  MCV 91.8  PLT 289   Cardiac Enzymes: No results for input(s): CKTOTAL, CKMB, CKMBINDEX, TROPONINI in the last 168 hours. BNP: Invalid input(s): POCBNP CBG: No results for input(s): GLUCAP in the last 168 hours. D-Dimer No results for input(s): DDIMER in the last 72 hours. Hgb A1c No results for input(s): HGBA1C in the last 72 hours. Lipid Profile No results for input(s): CHOL, HDL,  LDLCALC, TRIG, CHOLHDL, LDLDIRECT in the last 72 hours. Thyroid function studies No results for input(s): TSH, T4TOTAL, T3FREE, THYROIDAB in the last 72 hours.  Invalid input(s): FREET3 Anemia work up No results for input(s): VITAMINB12, FOLATE, FERRITIN, TIBC, IRON, RETICCTPCT in the last 72 hours. Urinalysis    Component Value Date/Time   COLORURINE YELLOW 09/04/2021 2120   APPEARANCEUR CLEAR 09/04/2021 2120   LABSPEC 1.020 09/04/2021 2120   PHURINE 6.0 09/04/2021 2120   GLUCOSEU NEGATIVE 09/04/2021 2120   HGBUR TRACE (A) 09/04/2021 2120   BILIRUBINUR NEGATIVE 09/04/2021 2120   KETONESUR TRACE (A) 09/04/2021 2120   PROTEINUR 30 (A) 09/04/2021 2120   NITRITE NEGATIVE 09/04/2021 2120   LEUKOCYTESUR NEGATIVE 09/04/2021 2120    Microbiology Recent Results (from the past 240 hour(s))  Urine Culture     Status:  Abnormal   Collection Time: 09/04/21  9:21 PM   Specimen: Urine, Clean Catch  Result Value Ref Range Status   Specimen Description   Final    URINE, CLEAN CATCH Performed at [facility], [place] [address] [place], [place] [address]    Special Requests   Final    NONE Performed at [facility], [place] [address] [place], [place] [address]    Culture MULTIPLE SPECIES PRESENT, SUGGEST RECOLLECTION (A)  Final   Report Status 09/06/2021 FINAL  Final    Time coordinating discharge: Approximately 40 minutes  Patrecia Pour, MD  [facility] 09/07/2021, 11:47 AM

## 2021-09-07 NOTE — Plan of Care (Signed)
Discharge instructions reviewed with patient and daughter, questions answered, verbalized understanding.

## 2021-09-07 NOTE — TOC Transition Note (Signed)
Transition of Care [facility]) - CM/SW Discharge Note   Patient Details  Name: Paul [name] MRN: [id number] Date of Birth: 1930/03/22  [facility]) CM/SW Contact:  Iona Beard, [place] Phone Number: 09/07/2021, 1:39 PM   Clinical Narrative:    [facility] consulted for [facility] needs. [facility] has agreed to accept pt for [facility] PT and OT. CSW updated MD and RN of this. Orders for [facility] have been placed. TOC signing off.   Final next level of care: [place] Barriers to Discharge: Barriers Resolved   Patient Goals and CMS Choice Patient states their goals for this hospitalization and ongoing recovery are:: Home with [facility]   Choice offered to / list presented to : Adult Children, Spouse  Discharge Placement                       Discharge Plan and Services                          HH Arranged: PT, OT HH Agency: [facility] Date [place]: 09/07/21      Social Determinants of Health (SDOH) Interventions     Readmission Risk Interventions No flowsheet data found.

## 2021-09-11 ENCOUNTER — Telehealth: Payer: Self-pay | Admitting: Internal Medicine

## 2021-09-11 NOTE — Telephone Encounter (Signed)
Attempted to contact patient's daughter, Marjorie Smolder, to offer to schedule a Palliative Consult, no answer.  Left message with reason for call along with my name and call back number requesting a return call.

## 2021-09-12 ENCOUNTER — Telehealth: Payer: Self-pay | Admitting: Internal Medicine

## 2021-09-12 NOTE — Telephone Encounter (Signed)
Ret'd call to patient's daughter, Marjorie Smolder, and discussed the Palliative referral/services and she was in agreement with scheduling visit.  I have scheduled an In-home Palliative Consult for 10/09/21 @ 3 PM with Dr. Hollace Kinnier, documentation will be noted in [facility]'s EMR, [facility].

## 2021-09-14 DIAGNOSIS — U071 COVID-19: Secondary | ICD-10-CM | POA: Diagnosis not present

## 2021-09-14 DIAGNOSIS — C679 Malignant neoplasm of bladder, unspecified: Secondary | ICD-10-CM | POA: Diagnosis not present

## 2021-09-14 DIAGNOSIS — J9601 Acute respiratory failure with hypoxia: Secondary | ICD-10-CM | POA: Diagnosis not present

## 2021-09-14 DIAGNOSIS — F0390 Unspecified dementia without behavioral disturbance: Secondary | ICD-10-CM | POA: Diagnosis not present

## 2021-09-14 DIAGNOSIS — I700 Atherosclerosis of aorta: Secondary | ICD-10-CM | POA: Diagnosis not present

## 2021-09-14 DIAGNOSIS — I10 Essential (primary) hypertension: Secondary | ICD-10-CM | POA: Diagnosis not present

## 2021-09-14 DIAGNOSIS — I739 Peripheral vascular disease, unspecified: Secondary | ICD-10-CM | POA: Diagnosis not present

## 2021-09-14 DIAGNOSIS — H9190 Unspecified hearing loss, unspecified ear: Secondary | ICD-10-CM | POA: Diagnosis not present

## 2021-09-14 DIAGNOSIS — Z9089 Acquired absence of other organs: Secondary | ICD-10-CM | POA: Diagnosis not present

## 2021-09-14 DIAGNOSIS — N400 Enlarged prostate without lower urinary tract symptoms: Secondary | ICD-10-CM | POA: Diagnosis not present

## 2021-09-14 DIAGNOSIS — Z9181 History of falling: Secondary | ICD-10-CM | POA: Diagnosis not present

## 2021-09-14 DIAGNOSIS — F329 Major depressive disorder, single episode, unspecified: Secondary | ICD-10-CM | POA: Diagnosis not present

## 2021-09-14 DIAGNOSIS — Z7902 Long term (current) use of antithrombotics/antiplatelets: Secondary | ICD-10-CM | POA: Diagnosis not present

## 2021-09-14 DIAGNOSIS — E785 Hyperlipidemia, unspecified: Secondary | ICD-10-CM | POA: Diagnosis not present

## 2021-09-17 DIAGNOSIS — J9601 Acute respiratory failure with hypoxia: Secondary | ICD-10-CM | POA: Diagnosis not present

## 2021-09-17 DIAGNOSIS — E785 Hyperlipidemia, unspecified: Secondary | ICD-10-CM | POA: Diagnosis not present

## 2021-09-17 DIAGNOSIS — U071 COVID-19: Secondary | ICD-10-CM | POA: Diagnosis not present

## 2021-09-17 DIAGNOSIS — H9190 Unspecified hearing loss, unspecified ear: Secondary | ICD-10-CM | POA: Diagnosis not present

## 2021-09-17 DIAGNOSIS — Z7902 Long term (current) use of antithrombotics/antiplatelets: Secondary | ICD-10-CM | POA: Diagnosis not present

## 2021-09-17 DIAGNOSIS — F0390 Unspecified dementia without behavioral disturbance: Secondary | ICD-10-CM | POA: Diagnosis not present

## 2021-09-17 DIAGNOSIS — I10 Essential (primary) hypertension: Secondary | ICD-10-CM | POA: Diagnosis not present

## 2021-09-17 DIAGNOSIS — Z9181 History of falling: Secondary | ICD-10-CM | POA: Diagnosis not present

## 2021-09-17 DIAGNOSIS — I700 Atherosclerosis of aorta: Secondary | ICD-10-CM | POA: Diagnosis not present

## 2021-09-17 DIAGNOSIS — Z9089 Acquired absence of other organs: Secondary | ICD-10-CM | POA: Diagnosis not present

## 2021-09-17 DIAGNOSIS — F329 Major depressive disorder, single episode, unspecified: Secondary | ICD-10-CM | POA: Diagnosis not present

## 2021-09-17 DIAGNOSIS — C679 Malignant neoplasm of bladder, unspecified: Secondary | ICD-10-CM | POA: Diagnosis not present

## 2021-09-17 DIAGNOSIS — I739 Peripheral vascular disease, unspecified: Secondary | ICD-10-CM | POA: Diagnosis not present

## 2021-09-17 DIAGNOSIS — N400 Enlarged prostate without lower urinary tract symptoms: Secondary | ICD-10-CM | POA: Diagnosis not present

## 2021-09-29 IMAGING — CT CT HEAD W/O CM
4 series · 16 of 47 positions shown, 18 images · non-contrast
Comparison: None.

CLINICAL DATA: Mechanical fall

EXAM:
CT HEAD WITHOUT CONTRAST
TECHNIQUE: Contiguous axial images were obtained from the base of the skull
through the vertex without intravenous contrast.

[Series 3: head without · axial · non-contrast · 0.46mm/px · z∈[-122,+3]mm · 7 of 35 slices shown, 9 images]
[im 5/35  brain]
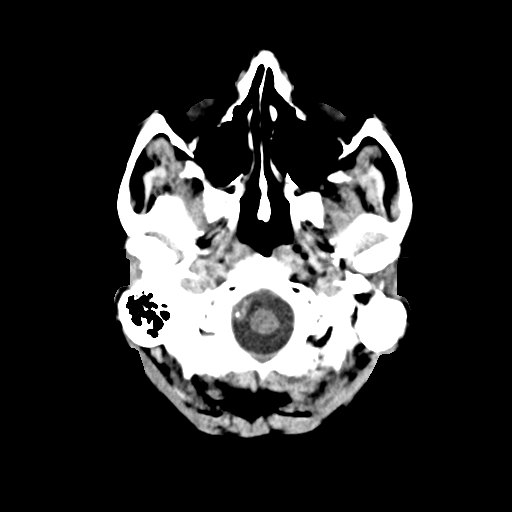
[im 5/35  bone]
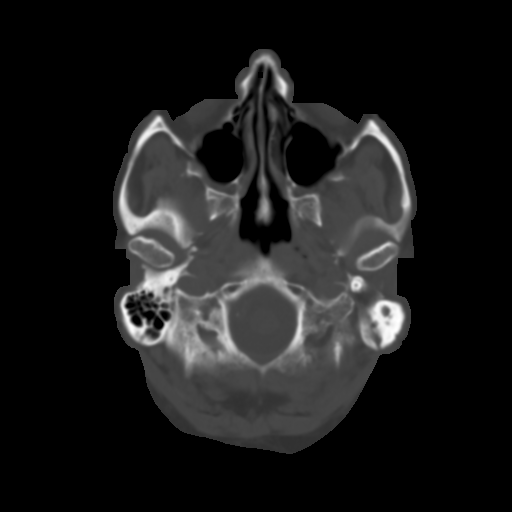
[im 9/35  brain]
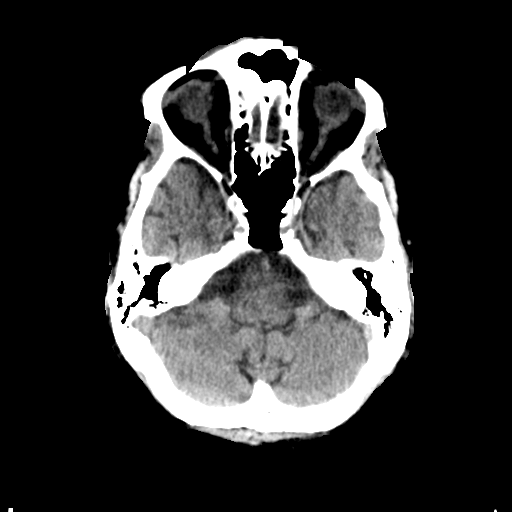
[im 13/35  brain]
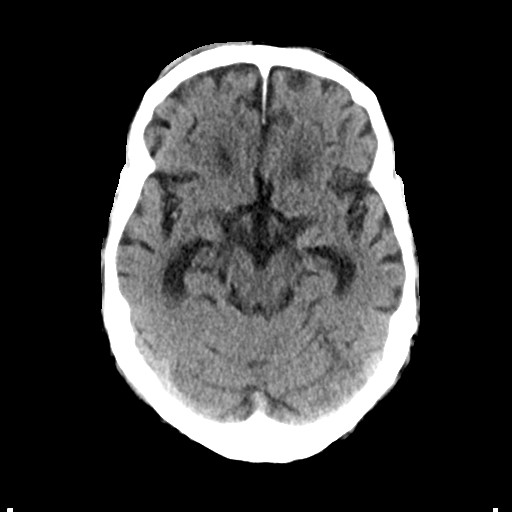
[im 18/35  brain]
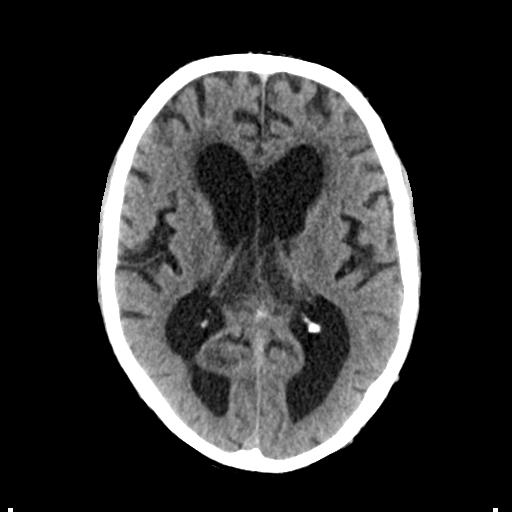
[im 22/35  brain]
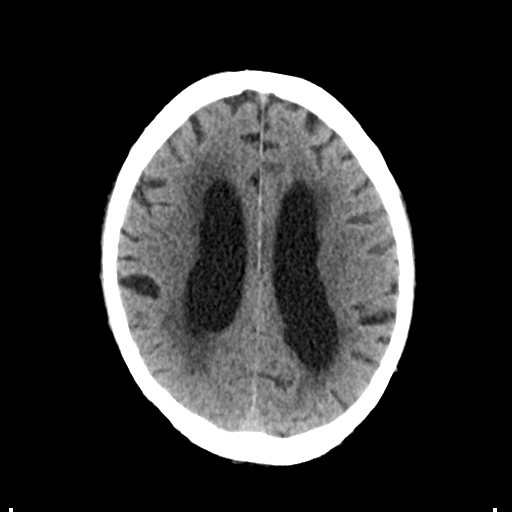
[im 22/35  bone]
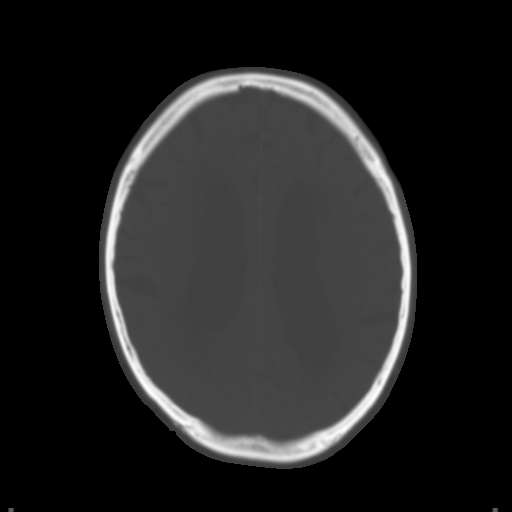
[im 26/35  brain]
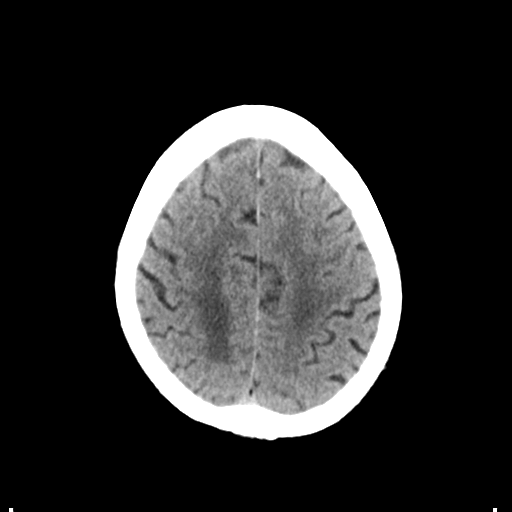
[im 30/35  brain]
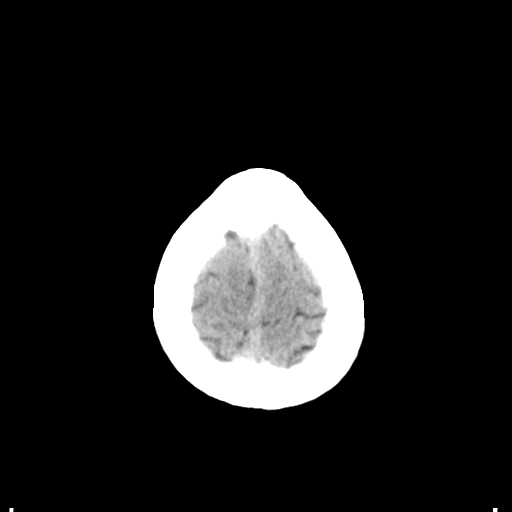

[Series 4: head bone · axial · 0.46mm/px · z∈[-126,-92]mm · 3 of 86 slices shown]
[im 9/86  bone]
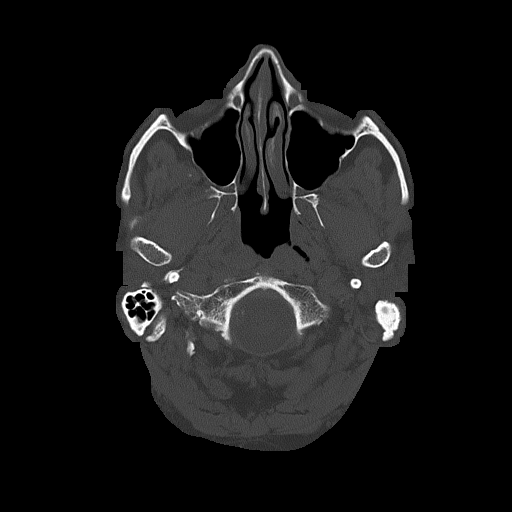
[im 18/86  bone]
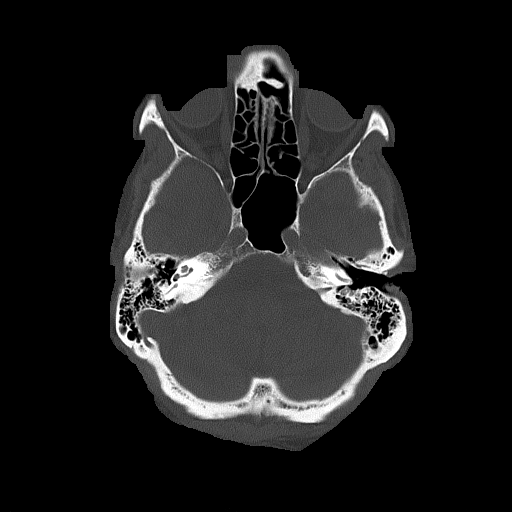
[im 26/86  bone]
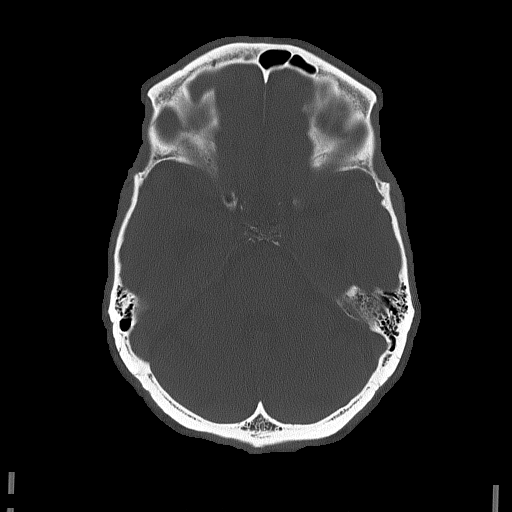

[Series 5: head without cor · coronal · non-contrast · 0.35mm/px · 3 of 73 slices shown]
[im 25/73  brain]
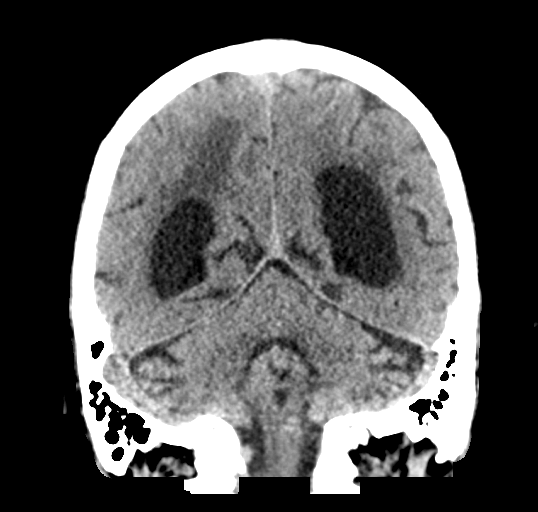
[im 33/73  brain]
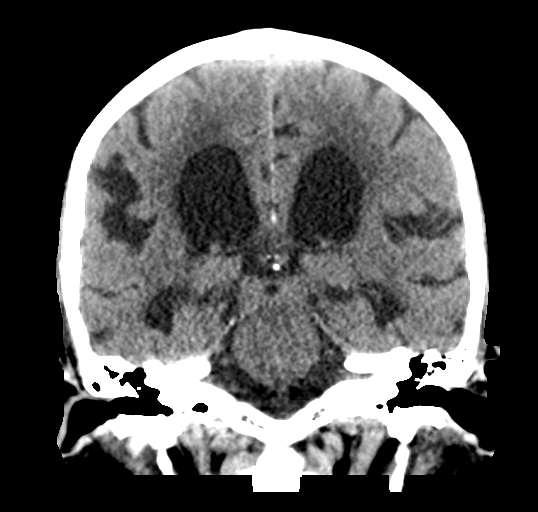
[im 41/73  brain]
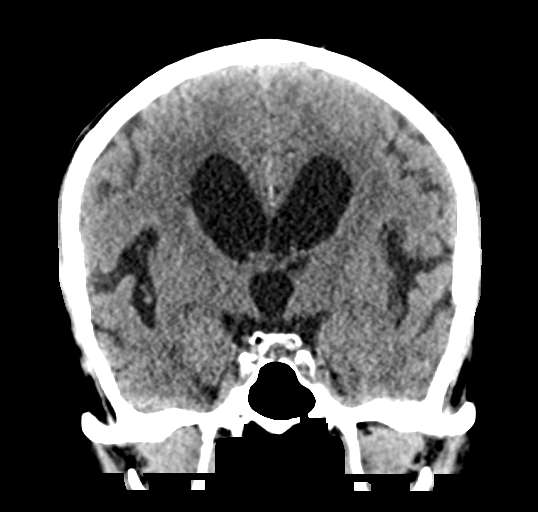

[Series 6: head without sag · sagittal · non-contrast · 0.34mm/px · 3 of 67 slices shown]
[im 23/67  brain]
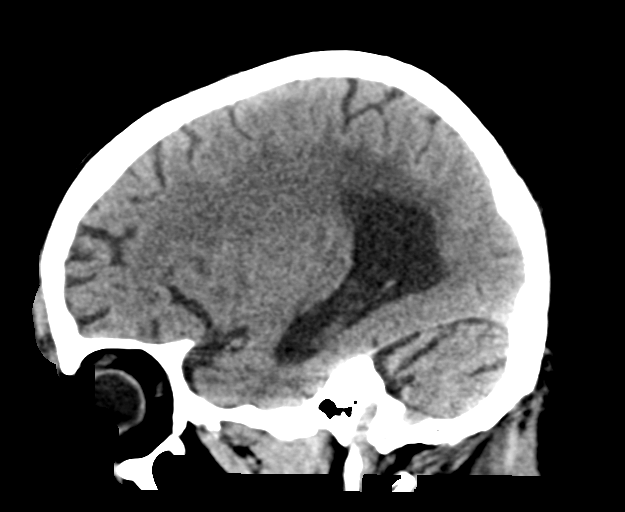
[im 34/67  brain]
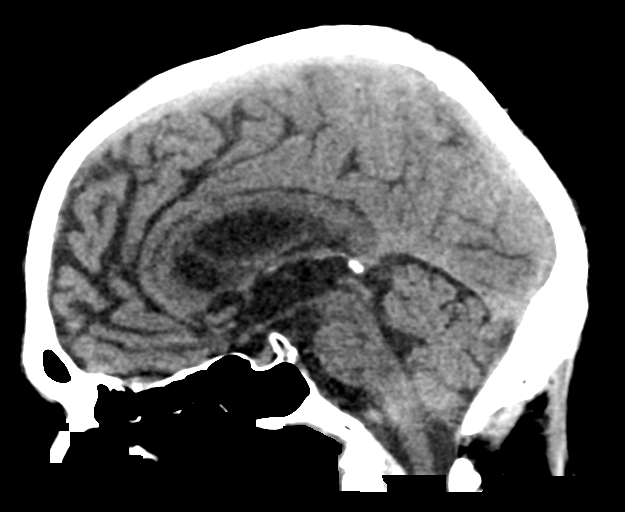
[im 45/67  brain]
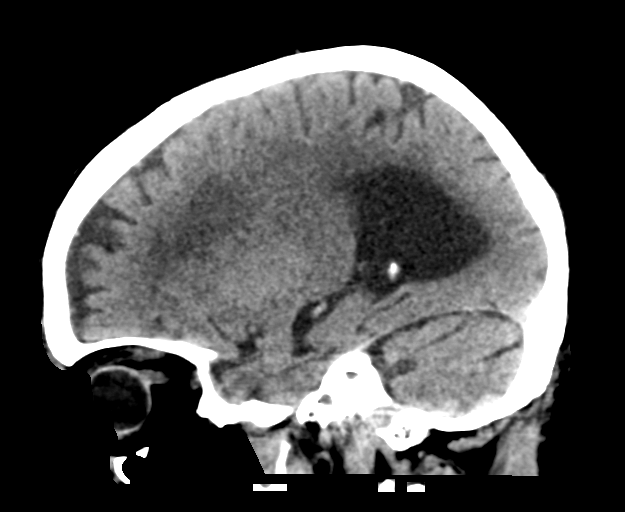

[16 of 47 positions shown; findings below may reference images not displayed]

FINDINGS: Brain: No evidence of acute territorial infarction, hemorrhage,
hydrocephalus,extra-axial collection or mass lesion/mass effect.
There is dilatation the ventricles and sulci consistent with
age-related atrophy. Low-attenuation changes in the deep white
matter consistent with small vessel ischemia.

Vascular: No hyperdense vessel or unexpected calcification.

Skull: The skull is intact. No fracture or focal lesion identified.

Sinuses/Orbits: The visualized paranasal sinuses and mastoid air
cells are clear. The orbits and globes intact.

Other: Small soft tissue hematoma seen overlying the right frontal
skull.
IMPRESSION: No acute intracranial abnormality.

Findings consistent with age related atrophy and chronic small
vessel ischemia

Small soft tissue hematoma overlying the right frontal skull.

## 2021-09-30 DIAGNOSIS — I10 Essential (primary) hypertension: Secondary | ICD-10-CM | POA: Diagnosis not present

## 2021-09-30 DIAGNOSIS — Z9181 History of falling: Secondary | ICD-10-CM | POA: Diagnosis not present

## 2021-09-30 DIAGNOSIS — H9190 Unspecified hearing loss, unspecified ear: Secondary | ICD-10-CM | POA: Diagnosis not present

## 2021-09-30 DIAGNOSIS — E785 Hyperlipidemia, unspecified: Secondary | ICD-10-CM | POA: Diagnosis not present

## 2021-09-30 DIAGNOSIS — C679 Malignant neoplasm of bladder, unspecified: Secondary | ICD-10-CM | POA: Diagnosis not present

## 2021-09-30 DIAGNOSIS — Z9089 Acquired absence of other organs: Secondary | ICD-10-CM | POA: Diagnosis not present

## 2021-09-30 DIAGNOSIS — I739 Peripheral vascular disease, unspecified: Secondary | ICD-10-CM | POA: Diagnosis not present

## 2021-09-30 DIAGNOSIS — F0390 Unspecified dementia without behavioral disturbance: Secondary | ICD-10-CM | POA: Diagnosis not present

## 2021-09-30 DIAGNOSIS — Z7902 Long term (current) use of antithrombotics/antiplatelets: Secondary | ICD-10-CM | POA: Diagnosis not present

## 2021-09-30 DIAGNOSIS — I700 Atherosclerosis of aorta: Secondary | ICD-10-CM | POA: Diagnosis not present

## 2021-09-30 DIAGNOSIS — U071 COVID-19: Secondary | ICD-10-CM | POA: Diagnosis not present

## 2021-09-30 DIAGNOSIS — N400 Enlarged prostate without lower urinary tract symptoms: Secondary | ICD-10-CM | POA: Diagnosis not present

## 2021-09-30 DIAGNOSIS — F329 Major depressive disorder, single episode, unspecified: Secondary | ICD-10-CM | POA: Diagnosis not present

## 2021-09-30 DIAGNOSIS — J9601 Acute respiratory failure with hypoxia: Secondary | ICD-10-CM | POA: Diagnosis not present

## 2021-10-07 DIAGNOSIS — Z23 Encounter for immunization: Secondary | ICD-10-CM | POA: Diagnosis not present

## 2021-10-07 DIAGNOSIS — E538 Deficiency of other specified B group vitamins: Secondary | ICD-10-CM | POA: Diagnosis not present

## 2021-10-07 DIAGNOSIS — R1310 Dysphagia, unspecified: Secondary | ICD-10-CM | POA: Diagnosis not present

## 2021-10-07 DIAGNOSIS — I6990 Unspecified sequelae of unspecified cerebrovascular disease: Secondary | ICD-10-CM | POA: Diagnosis not present

## 2021-10-07 DIAGNOSIS — I10 Essential (primary) hypertension: Secondary | ICD-10-CM | POA: Diagnosis not present

## 2021-10-07 DIAGNOSIS — R634 Abnormal weight loss: Secondary | ICD-10-CM | POA: Diagnosis not present

## 2021-10-07 DIAGNOSIS — I739 Peripheral vascular disease, unspecified: Secondary | ICD-10-CM | POA: Diagnosis not present

## 2021-10-07 DIAGNOSIS — F0390 Unspecified dementia without behavioral disturbance: Secondary | ICD-10-CM | POA: Diagnosis not present

## 2021-10-07 DIAGNOSIS — M1990 Unspecified osteoarthritis, unspecified site: Secondary | ICD-10-CM | POA: Diagnosis not present

## 2021-10-07 DIAGNOSIS — R2681 Unsteadiness on feet: Secondary | ICD-10-CM | POA: Diagnosis not present

## 2021-10-07 DIAGNOSIS — F329 Major depressive disorder, single episode, unspecified: Secondary | ICD-10-CM | POA: Diagnosis not present

## 2021-10-07 DIAGNOSIS — Z0000 Encounter for general adult medical examination without abnormal findings: Secondary | ICD-10-CM | POA: Diagnosis not present

## 2021-10-07 DIAGNOSIS — D692 Other nonthrombocytopenic purpura: Secondary | ICD-10-CM | POA: Diagnosis not present

## 2021-10-09 ENCOUNTER — Other Ambulatory Visit: Payer: PPO | Admitting: Internal Medicine

## 2021-10-09 ENCOUNTER — Other Ambulatory Visit: Payer: Self-pay

## 2021-10-09 DIAGNOSIS — Z515 Encounter for palliative care: Secondary | ICD-10-CM | POA: Diagnosis not present

## 2021-10-09 DIAGNOSIS — F331 Major depressive disorder, recurrent, moderate: Secondary | ICD-10-CM | POA: Diagnosis not present

## 2021-10-09 DIAGNOSIS — R54 Age-related physical debility: Secondary | ICD-10-CM | POA: Diagnosis not present

## 2021-10-09 DIAGNOSIS — F03911 Unspecified dementia, unspecified severity, with agitation: Secondary | ICD-10-CM | POA: Diagnosis not present

## 2021-10-16 DIAGNOSIS — N400 Enlarged prostate without lower urinary tract symptoms: Secondary | ICD-10-CM | POA: Diagnosis not present

## 2021-10-16 DIAGNOSIS — F0390 Unspecified dementia without behavioral disturbance: Secondary | ICD-10-CM | POA: Diagnosis not present

## 2021-10-16 DIAGNOSIS — Z9089 Acquired absence of other organs: Secondary | ICD-10-CM | POA: Diagnosis not present

## 2021-10-16 DIAGNOSIS — I700 Atherosclerosis of aorta: Secondary | ICD-10-CM | POA: Diagnosis not present

## 2021-10-16 DIAGNOSIS — J9601 Acute respiratory failure with hypoxia: Secondary | ICD-10-CM | POA: Diagnosis not present

## 2021-10-16 DIAGNOSIS — E785 Hyperlipidemia, unspecified: Secondary | ICD-10-CM | POA: Diagnosis not present

## 2021-10-16 DIAGNOSIS — I10 Essential (primary) hypertension: Secondary | ICD-10-CM | POA: Diagnosis not present

## 2021-10-16 DIAGNOSIS — U071 COVID-19: Secondary | ICD-10-CM | POA: Diagnosis not present

## 2021-10-16 DIAGNOSIS — Z9181 History of falling: Secondary | ICD-10-CM | POA: Diagnosis not present

## 2021-10-16 DIAGNOSIS — I739 Peripheral vascular disease, unspecified: Secondary | ICD-10-CM | POA: Diagnosis not present

## 2021-10-16 DIAGNOSIS — H9190 Unspecified hearing loss, unspecified ear: Secondary | ICD-10-CM | POA: Diagnosis not present

## 2021-10-16 DIAGNOSIS — F329 Major depressive disorder, single episode, unspecified: Secondary | ICD-10-CM | POA: Diagnosis not present

## 2021-10-16 DIAGNOSIS — Z7902 Long term (current) use of antithrombotics/antiplatelets: Secondary | ICD-10-CM | POA: Diagnosis not present

## 2021-10-16 DIAGNOSIS — C679 Malignant neoplasm of bladder, unspecified: Secondary | ICD-10-CM | POA: Diagnosis not present

## 2021-10-30 DIAGNOSIS — J9601 Acute respiratory failure with hypoxia: Secondary | ICD-10-CM | POA: Diagnosis not present

## 2021-10-30 DIAGNOSIS — F0390 Unspecified dementia without behavioral disturbance: Secondary | ICD-10-CM | POA: Diagnosis not present

## 2021-10-30 DIAGNOSIS — I700 Atherosclerosis of aorta: Secondary | ICD-10-CM | POA: Diagnosis not present

## 2021-10-30 DIAGNOSIS — N400 Enlarged prostate without lower urinary tract symptoms: Secondary | ICD-10-CM | POA: Diagnosis not present

## 2021-10-30 DIAGNOSIS — E785 Hyperlipidemia, unspecified: Secondary | ICD-10-CM | POA: Diagnosis not present

## 2021-10-30 DIAGNOSIS — I10 Essential (primary) hypertension: Secondary | ICD-10-CM | POA: Diagnosis not present

## 2021-10-30 DIAGNOSIS — F329 Major depressive disorder, single episode, unspecified: Secondary | ICD-10-CM | POA: Diagnosis not present

## 2021-10-30 DIAGNOSIS — Z7902 Long term (current) use of antithrombotics/antiplatelets: Secondary | ICD-10-CM | POA: Diagnosis not present

## 2021-10-30 DIAGNOSIS — Z9089 Acquired absence of other organs: Secondary | ICD-10-CM | POA: Diagnosis not present

## 2021-10-30 DIAGNOSIS — Z9181 History of falling: Secondary | ICD-10-CM | POA: Diagnosis not present

## 2021-10-30 DIAGNOSIS — C679 Malignant neoplasm of bladder, unspecified: Secondary | ICD-10-CM | POA: Diagnosis not present

## 2021-10-30 DIAGNOSIS — U071 COVID-19: Secondary | ICD-10-CM | POA: Diagnosis not present

## 2021-10-30 DIAGNOSIS — H9190 Unspecified hearing loss, unspecified ear: Secondary | ICD-10-CM | POA: Diagnosis not present

## 2021-10-30 DIAGNOSIS — I739 Peripheral vascular disease, unspecified: Secondary | ICD-10-CM | POA: Diagnosis not present

## 2021-11-05 DIAGNOSIS — J9601 Acute respiratory failure with hypoxia: Secondary | ICD-10-CM | POA: Diagnosis not present

## 2021-11-05 DIAGNOSIS — Z9181 History of falling: Secondary | ICD-10-CM | POA: Diagnosis not present

## 2021-11-05 DIAGNOSIS — E785 Hyperlipidemia, unspecified: Secondary | ICD-10-CM | POA: Diagnosis not present

## 2021-11-05 DIAGNOSIS — U071 COVID-19: Secondary | ICD-10-CM | POA: Diagnosis not present

## 2021-11-05 DIAGNOSIS — C679 Malignant neoplasm of bladder, unspecified: Secondary | ICD-10-CM | POA: Diagnosis not present

## 2021-11-05 DIAGNOSIS — I700 Atherosclerosis of aorta: Secondary | ICD-10-CM | POA: Diagnosis not present

## 2021-11-05 DIAGNOSIS — Z9089 Acquired absence of other organs: Secondary | ICD-10-CM | POA: Diagnosis not present

## 2021-11-05 DIAGNOSIS — H9190 Unspecified hearing loss, unspecified ear: Secondary | ICD-10-CM | POA: Diagnosis not present

## 2021-11-05 DIAGNOSIS — I739 Peripheral vascular disease, unspecified: Secondary | ICD-10-CM | POA: Diagnosis not present

## 2021-11-05 DIAGNOSIS — Z7902 Long term (current) use of antithrombotics/antiplatelets: Secondary | ICD-10-CM | POA: Diagnosis not present

## 2021-11-05 DIAGNOSIS — I10 Essential (primary) hypertension: Secondary | ICD-10-CM | POA: Diagnosis not present

## 2021-11-05 DIAGNOSIS — F329 Major depressive disorder, single episode, unspecified: Secondary | ICD-10-CM | POA: Diagnosis not present

## 2021-11-05 DIAGNOSIS — F0390 Unspecified dementia without behavioral disturbance: Secondary | ICD-10-CM | POA: Diagnosis not present

## 2021-11-05 DIAGNOSIS — N400 Enlarged prostate without lower urinary tract symptoms: Secondary | ICD-10-CM | POA: Diagnosis not present

## 2021-11-08 DIAGNOSIS — F0390 Unspecified dementia without behavioral disturbance: Secondary | ICD-10-CM | POA: Diagnosis not present

## 2021-11-08 DIAGNOSIS — I739 Peripheral vascular disease, unspecified: Secondary | ICD-10-CM | POA: Diagnosis not present

## 2021-11-08 DIAGNOSIS — F329 Major depressive disorder, single episode, unspecified: Secondary | ICD-10-CM | POA: Diagnosis not present

## 2021-11-08 DIAGNOSIS — N400 Enlarged prostate without lower urinary tract symptoms: Secondary | ICD-10-CM | POA: Diagnosis not present

## 2021-11-08 DIAGNOSIS — I700 Atherosclerosis of aorta: Secondary | ICD-10-CM | POA: Diagnosis not present

## 2021-11-08 DIAGNOSIS — E785 Hyperlipidemia, unspecified: Secondary | ICD-10-CM | POA: Diagnosis not present

## 2021-11-08 DIAGNOSIS — I10 Essential (primary) hypertension: Secondary | ICD-10-CM | POA: Diagnosis not present

## 2021-11-08 DIAGNOSIS — Z9089 Acquired absence of other organs: Secondary | ICD-10-CM | POA: Diagnosis not present

## 2021-11-08 DIAGNOSIS — C679 Malignant neoplasm of bladder, unspecified: Secondary | ICD-10-CM | POA: Diagnosis not present

## 2021-11-08 DIAGNOSIS — U071 COVID-19: Secondary | ICD-10-CM | POA: Diagnosis not present

## 2021-11-08 DIAGNOSIS — Z7902 Long term (current) use of antithrombotics/antiplatelets: Secondary | ICD-10-CM | POA: Diagnosis not present

## 2021-11-08 DIAGNOSIS — H9190 Unspecified hearing loss, unspecified ear: Secondary | ICD-10-CM | POA: Diagnosis not present

## 2021-11-08 DIAGNOSIS — J9601 Acute respiratory failure with hypoxia: Secondary | ICD-10-CM | POA: Diagnosis not present

## 2021-11-08 DIAGNOSIS — Z9181 History of falling: Secondary | ICD-10-CM | POA: Diagnosis not present

## 2021-11-13 DIAGNOSIS — F0390 Unspecified dementia without behavioral disturbance: Secondary | ICD-10-CM | POA: Diagnosis not present

## 2021-11-13 DIAGNOSIS — F329 Major depressive disorder, single episode, unspecified: Secondary | ICD-10-CM | POA: Diagnosis not present

## 2021-11-13 DIAGNOSIS — H9190 Unspecified hearing loss, unspecified ear: Secondary | ICD-10-CM | POA: Diagnosis not present

## 2021-11-13 DIAGNOSIS — I700 Atherosclerosis of aorta: Secondary | ICD-10-CM | POA: Diagnosis not present

## 2021-11-13 DIAGNOSIS — I10 Essential (primary) hypertension: Secondary | ICD-10-CM | POA: Diagnosis not present

## 2021-11-13 DIAGNOSIS — N400 Enlarged prostate without lower urinary tract symptoms: Secondary | ICD-10-CM | POA: Diagnosis not present

## 2021-11-13 DIAGNOSIS — J9601 Acute respiratory failure with hypoxia: Secondary | ICD-10-CM | POA: Diagnosis not present

## 2021-11-13 DIAGNOSIS — Z7902 Long term (current) use of antithrombotics/antiplatelets: Secondary | ICD-10-CM | POA: Diagnosis not present

## 2021-11-13 DIAGNOSIS — E785 Hyperlipidemia, unspecified: Secondary | ICD-10-CM | POA: Diagnosis not present

## 2021-11-13 DIAGNOSIS — C679 Malignant neoplasm of bladder, unspecified: Secondary | ICD-10-CM | POA: Diagnosis not present

## 2021-11-13 DIAGNOSIS — I739 Peripheral vascular disease, unspecified: Secondary | ICD-10-CM | POA: Diagnosis not present

## 2021-11-13 DIAGNOSIS — U071 COVID-19: Secondary | ICD-10-CM | POA: Diagnosis not present

## 2021-11-18 DIAGNOSIS — E785 Hyperlipidemia, unspecified: Secondary | ICD-10-CM | POA: Diagnosis not present

## 2021-11-18 DIAGNOSIS — I10 Essential (primary) hypertension: Secondary | ICD-10-CM | POA: Diagnosis not present

## 2021-11-18 DIAGNOSIS — F03918 Unspecified dementia, unspecified severity, with other behavioral disturbance: Secondary | ICD-10-CM | POA: Diagnosis not present

## 2021-11-18 DIAGNOSIS — M549 Dorsalgia, unspecified: Secondary | ICD-10-CM | POA: Diagnosis not present

## 2021-11-18 DIAGNOSIS — N400 Enlarged prostate without lower urinary tract symptoms: Secondary | ICD-10-CM | POA: Diagnosis not present

## 2021-11-18 DIAGNOSIS — H9190 Unspecified hearing loss, unspecified ear: Secondary | ICD-10-CM | POA: Diagnosis not present

## 2021-11-18 DIAGNOSIS — Z9089 Acquired absence of other organs: Secondary | ICD-10-CM | POA: Diagnosis not present

## 2021-11-18 DIAGNOSIS — I739 Peripheral vascular disease, unspecified: Secondary | ICD-10-CM | POA: Diagnosis not present

## 2021-11-18 DIAGNOSIS — I700 Atherosclerosis of aorta: Secondary | ICD-10-CM | POA: Diagnosis not present

## 2021-11-18 DIAGNOSIS — C679 Malignant neoplasm of bladder, unspecified: Secondary | ICD-10-CM | POA: Diagnosis not present

## 2021-11-18 DIAGNOSIS — Z7902 Long term (current) use of antithrombotics/antiplatelets: Secondary | ICD-10-CM | POA: Diagnosis not present

## 2021-11-18 DIAGNOSIS — Z9181 History of falling: Secondary | ICD-10-CM | POA: Diagnosis not present

## 2021-11-18 DIAGNOSIS — U071 COVID-19: Secondary | ICD-10-CM | POA: Diagnosis not present

## 2021-11-18 DIAGNOSIS — J9601 Acute respiratory failure with hypoxia: Secondary | ICD-10-CM | POA: Diagnosis not present

## 2021-11-18 DIAGNOSIS — F0393 Unspecified dementia, unspecified severity, with mood disturbance: Secondary | ICD-10-CM | POA: Diagnosis not present

## 2021-11-18 DIAGNOSIS — F05 Delirium due to known physiological condition: Secondary | ICD-10-CM | POA: Diagnosis not present

## 2021-11-18 DIAGNOSIS — F329 Major depressive disorder, single episode, unspecified: Secondary | ICD-10-CM | POA: Diagnosis not present

## 2021-11-18 DIAGNOSIS — I2510 Atherosclerotic heart disease of native coronary artery without angina pectoris: Secondary | ICD-10-CM | POA: Diagnosis not present

## 2021-11-18 DIAGNOSIS — Z87891 Personal history of nicotine dependence: Secondary | ICD-10-CM | POA: Diagnosis not present

## 2021-12-11 DIAGNOSIS — F03918 Unspecified dementia, unspecified severity, with other behavioral disturbance: Secondary | ICD-10-CM | POA: Diagnosis not present

## 2021-12-11 DIAGNOSIS — E785 Hyperlipidemia, unspecified: Secondary | ICD-10-CM | POA: Diagnosis not present

## 2021-12-11 DIAGNOSIS — I739 Peripheral vascular disease, unspecified: Secondary | ICD-10-CM | POA: Diagnosis not present

## 2021-12-11 DIAGNOSIS — N400 Enlarged prostate without lower urinary tract symptoms: Secondary | ICD-10-CM | POA: Diagnosis not present

## 2021-12-11 DIAGNOSIS — I10 Essential (primary) hypertension: Secondary | ICD-10-CM | POA: Diagnosis not present

## 2021-12-11 DIAGNOSIS — I2510 Atherosclerotic heart disease of native coronary artery without angina pectoris: Secondary | ICD-10-CM | POA: Diagnosis not present

## 2021-12-11 DIAGNOSIS — Z9089 Acquired absence of other organs: Secondary | ICD-10-CM | POA: Diagnosis not present

## 2021-12-11 DIAGNOSIS — C679 Malignant neoplasm of bladder, unspecified: Secondary | ICD-10-CM | POA: Diagnosis not present

## 2021-12-11 DIAGNOSIS — M549 Dorsalgia, unspecified: Secondary | ICD-10-CM | POA: Diagnosis not present

## 2021-12-11 DIAGNOSIS — I700 Atherosclerosis of aorta: Secondary | ICD-10-CM | POA: Diagnosis not present

## 2021-12-11 DIAGNOSIS — F0393 Unspecified dementia, unspecified severity, with mood disturbance: Secondary | ICD-10-CM | POA: Diagnosis not present

## 2021-12-11 DIAGNOSIS — Z87891 Personal history of nicotine dependence: Secondary | ICD-10-CM | POA: Diagnosis not present

## 2021-12-11 DIAGNOSIS — Z7902 Long term (current) use of antithrombotics/antiplatelets: Secondary | ICD-10-CM | POA: Diagnosis not present

## 2021-12-11 DIAGNOSIS — U071 COVID-19: Secondary | ICD-10-CM | POA: Diagnosis not present

## 2021-12-11 DIAGNOSIS — J9601 Acute respiratory failure with hypoxia: Secondary | ICD-10-CM | POA: Diagnosis not present

## 2021-12-11 DIAGNOSIS — F329 Major depressive disorder, single episode, unspecified: Secondary | ICD-10-CM | POA: Diagnosis not present

## 2021-12-11 DIAGNOSIS — H9190 Unspecified hearing loss, unspecified ear: Secondary | ICD-10-CM | POA: Diagnosis not present

## 2021-12-11 DIAGNOSIS — Z9181 History of falling: Secondary | ICD-10-CM | POA: Diagnosis not present

## 2021-12-11 DIAGNOSIS — F05 Delirium due to known physiological condition: Secondary | ICD-10-CM | POA: Diagnosis not present

## 2021-12-19 DIAGNOSIS — C679 Malignant neoplasm of bladder, unspecified: Secondary | ICD-10-CM | POA: Diagnosis not present

## 2021-12-19 DIAGNOSIS — I10 Essential (primary) hypertension: Secondary | ICD-10-CM | POA: Diagnosis not present

## 2021-12-19 DIAGNOSIS — F0393 Unspecified dementia, unspecified severity, with mood disturbance: Secondary | ICD-10-CM | POA: Diagnosis not present

## 2021-12-19 DIAGNOSIS — U071 COVID-19: Secondary | ICD-10-CM | POA: Diagnosis not present

## 2021-12-19 DIAGNOSIS — Z87891 Personal history of nicotine dependence: Secondary | ICD-10-CM | POA: Diagnosis not present

## 2021-12-19 DIAGNOSIS — I739 Peripheral vascular disease, unspecified: Secondary | ICD-10-CM | POA: Diagnosis not present

## 2021-12-19 DIAGNOSIS — F329 Major depressive disorder, single episode, unspecified: Secondary | ICD-10-CM | POA: Diagnosis not present

## 2021-12-19 DIAGNOSIS — Z7902 Long term (current) use of antithrombotics/antiplatelets: Secondary | ICD-10-CM | POA: Diagnosis not present

## 2021-12-19 DIAGNOSIS — Z9089 Acquired absence of other organs: Secondary | ICD-10-CM | POA: Diagnosis not present

## 2021-12-19 DIAGNOSIS — J9601 Acute respiratory failure with hypoxia: Secondary | ICD-10-CM | POA: Diagnosis not present

## 2021-12-19 DIAGNOSIS — F03918 Unspecified dementia, unspecified severity, with other behavioral disturbance: Secondary | ICD-10-CM | POA: Diagnosis not present

## 2021-12-19 DIAGNOSIS — I700 Atherosclerosis of aorta: Secondary | ICD-10-CM | POA: Diagnosis not present

## 2021-12-19 DIAGNOSIS — M549 Dorsalgia, unspecified: Secondary | ICD-10-CM | POA: Diagnosis not present

## 2021-12-19 DIAGNOSIS — H9190 Unspecified hearing loss, unspecified ear: Secondary | ICD-10-CM | POA: Diagnosis not present

## 2021-12-19 DIAGNOSIS — N400 Enlarged prostate without lower urinary tract symptoms: Secondary | ICD-10-CM | POA: Diagnosis not present

## 2021-12-19 DIAGNOSIS — F05 Delirium due to known physiological condition: Secondary | ICD-10-CM | POA: Diagnosis not present

## 2021-12-19 DIAGNOSIS — I2510 Atherosclerotic heart disease of native coronary artery without angina pectoris: Secondary | ICD-10-CM | POA: Diagnosis not present

## 2021-12-19 DIAGNOSIS — Z9181 History of falling: Secondary | ICD-10-CM | POA: Diagnosis not present

## 2021-12-19 DIAGNOSIS — E785 Hyperlipidemia, unspecified: Secondary | ICD-10-CM | POA: Diagnosis not present

## 2021-12-25 DIAGNOSIS — C679 Malignant neoplasm of bladder, unspecified: Secondary | ICD-10-CM | POA: Diagnosis not present

## 2021-12-25 DIAGNOSIS — R634 Abnormal weight loss: Secondary | ICD-10-CM | POA: Diagnosis not present

## 2021-12-25 DIAGNOSIS — F0390 Unspecified dementia without behavioral disturbance: Secondary | ICD-10-CM | POA: Diagnosis not present

## 2021-12-25 DIAGNOSIS — M1990 Unspecified osteoarthritis, unspecified site: Secondary | ICD-10-CM | POA: Diagnosis not present

## 2021-12-25 DIAGNOSIS — I739 Peripheral vascular disease, unspecified: Secondary | ICD-10-CM | POA: Diagnosis not present

## 2021-12-25 DIAGNOSIS — I10 Essential (primary) hypertension: Secondary | ICD-10-CM | POA: Diagnosis not present

## 2021-12-25 DIAGNOSIS — E538 Deficiency of other specified B group vitamins: Secondary | ICD-10-CM | POA: Diagnosis not present

## 2021-12-25 DIAGNOSIS — I6990 Unspecified sequelae of unspecified cerebrovascular disease: Secondary | ICD-10-CM | POA: Diagnosis not present

## 2021-12-25 DIAGNOSIS — E785 Hyperlipidemia, unspecified: Secondary | ICD-10-CM | POA: Diagnosis not present

## 2021-12-25 DIAGNOSIS — R2681 Unsteadiness on feet: Secondary | ICD-10-CM | POA: Diagnosis not present

## 2021-12-25 DIAGNOSIS — R1310 Dysphagia, unspecified: Secondary | ICD-10-CM | POA: Diagnosis not present

## 2021-12-25 DIAGNOSIS — F329 Major depressive disorder, single episode, unspecified: Secondary | ICD-10-CM | POA: Diagnosis not present

## 2021-12-26 DIAGNOSIS — H9190 Unspecified hearing loss, unspecified ear: Secondary | ICD-10-CM | POA: Diagnosis not present

## 2021-12-26 DIAGNOSIS — Z7902 Long term (current) use of antithrombotics/antiplatelets: Secondary | ICD-10-CM | POA: Diagnosis not present

## 2021-12-26 DIAGNOSIS — Z9089 Acquired absence of other organs: Secondary | ICD-10-CM | POA: Diagnosis not present

## 2021-12-26 DIAGNOSIS — I10 Essential (primary) hypertension: Secondary | ICD-10-CM | POA: Diagnosis not present

## 2021-12-26 DIAGNOSIS — F329 Major depressive disorder, single episode, unspecified: Secondary | ICD-10-CM | POA: Diagnosis not present

## 2021-12-26 DIAGNOSIS — F03918 Unspecified dementia, unspecified severity, with other behavioral disturbance: Secondary | ICD-10-CM | POA: Diagnosis not present

## 2021-12-26 DIAGNOSIS — Z87891 Personal history of nicotine dependence: Secondary | ICD-10-CM | POA: Diagnosis not present

## 2021-12-26 DIAGNOSIS — I2510 Atherosclerotic heart disease of native coronary artery without angina pectoris: Secondary | ICD-10-CM | POA: Diagnosis not present

## 2021-12-26 DIAGNOSIS — I700 Atherosclerosis of aorta: Secondary | ICD-10-CM | POA: Diagnosis not present

## 2021-12-26 DIAGNOSIS — M549 Dorsalgia, unspecified: Secondary | ICD-10-CM | POA: Diagnosis not present

## 2021-12-26 DIAGNOSIS — F0393 Unspecified dementia, unspecified severity, with mood disturbance: Secondary | ICD-10-CM | POA: Diagnosis not present

## 2021-12-26 DIAGNOSIS — J9601 Acute respiratory failure with hypoxia: Secondary | ICD-10-CM | POA: Diagnosis not present

## 2021-12-26 DIAGNOSIS — F05 Delirium due to known physiological condition: Secondary | ICD-10-CM | POA: Diagnosis not present

## 2021-12-26 DIAGNOSIS — I739 Peripheral vascular disease, unspecified: Secondary | ICD-10-CM | POA: Diagnosis not present

## 2021-12-26 DIAGNOSIS — N400 Enlarged prostate without lower urinary tract symptoms: Secondary | ICD-10-CM | POA: Diagnosis not present

## 2021-12-26 DIAGNOSIS — Z9181 History of falling: Secondary | ICD-10-CM | POA: Diagnosis not present

## 2021-12-26 DIAGNOSIS — C679 Malignant neoplasm of bladder, unspecified: Secondary | ICD-10-CM | POA: Diagnosis not present

## 2021-12-26 DIAGNOSIS — E785 Hyperlipidemia, unspecified: Secondary | ICD-10-CM | POA: Diagnosis not present

## 2021-12-26 DIAGNOSIS — U071 COVID-19: Secondary | ICD-10-CM | POA: Diagnosis not present

## 2022-01-09 DIAGNOSIS — F05 Delirium due to known physiological condition: Secondary | ICD-10-CM | POA: Diagnosis not present

## 2022-01-09 DIAGNOSIS — N400 Enlarged prostate without lower urinary tract symptoms: Secondary | ICD-10-CM | POA: Diagnosis not present

## 2022-01-09 DIAGNOSIS — Z9089 Acquired absence of other organs: Secondary | ICD-10-CM | POA: Diagnosis not present

## 2022-01-09 DIAGNOSIS — Z87891 Personal history of nicotine dependence: Secondary | ICD-10-CM | POA: Diagnosis not present

## 2022-01-09 DIAGNOSIS — I2510 Atherosclerotic heart disease of native coronary artery without angina pectoris: Secondary | ICD-10-CM | POA: Diagnosis not present

## 2022-01-09 DIAGNOSIS — Z7902 Long term (current) use of antithrombotics/antiplatelets: Secondary | ICD-10-CM | POA: Diagnosis not present

## 2022-01-09 DIAGNOSIS — F03918 Unspecified dementia, unspecified severity, with other behavioral disturbance: Secondary | ICD-10-CM | POA: Diagnosis not present

## 2022-01-09 DIAGNOSIS — F0393 Unspecified dementia, unspecified severity, with mood disturbance: Secondary | ICD-10-CM | POA: Diagnosis not present

## 2022-01-09 DIAGNOSIS — I10 Essential (primary) hypertension: Secondary | ICD-10-CM | POA: Diagnosis not present

## 2022-01-09 DIAGNOSIS — I739 Peripheral vascular disease, unspecified: Secondary | ICD-10-CM | POA: Diagnosis not present

## 2022-01-09 DIAGNOSIS — F329 Major depressive disorder, single episode, unspecified: Secondary | ICD-10-CM | POA: Diagnosis not present

## 2022-01-09 DIAGNOSIS — I700 Atherosclerosis of aorta: Secondary | ICD-10-CM | POA: Diagnosis not present

## 2022-01-09 DIAGNOSIS — H9190 Unspecified hearing loss, unspecified ear: Secondary | ICD-10-CM | POA: Diagnosis not present

## 2022-01-09 DIAGNOSIS — Z9181 History of falling: Secondary | ICD-10-CM | POA: Diagnosis not present

## 2022-01-09 DIAGNOSIS — J9601 Acute respiratory failure with hypoxia: Secondary | ICD-10-CM | POA: Diagnosis not present

## 2022-01-09 DIAGNOSIS — E785 Hyperlipidemia, unspecified: Secondary | ICD-10-CM | POA: Diagnosis not present

## 2022-01-09 DIAGNOSIS — U071 COVID-19: Secondary | ICD-10-CM | POA: Diagnosis not present

## 2022-01-09 DIAGNOSIS — C679 Malignant neoplasm of bladder, unspecified: Secondary | ICD-10-CM | POA: Diagnosis not present

## 2022-01-09 DIAGNOSIS — M549 Dorsalgia, unspecified: Secondary | ICD-10-CM | POA: Diagnosis not present

## 2022-01-12 DIAGNOSIS — H9190 Unspecified hearing loss, unspecified ear: Secondary | ICD-10-CM | POA: Diagnosis not present

## 2022-01-12 DIAGNOSIS — C679 Malignant neoplasm of bladder, unspecified: Secondary | ICD-10-CM | POA: Diagnosis not present

## 2022-01-12 DIAGNOSIS — N400 Enlarged prostate without lower urinary tract symptoms: Secondary | ICD-10-CM | POA: Diagnosis not present

## 2022-01-12 DIAGNOSIS — F03918 Unspecified dementia, unspecified severity, with other behavioral disturbance: Secondary | ICD-10-CM | POA: Diagnosis not present

## 2022-01-12 DIAGNOSIS — I739 Peripheral vascular disease, unspecified: Secondary | ICD-10-CM | POA: Diagnosis not present

## 2022-01-12 DIAGNOSIS — F05 Delirium due to known physiological condition: Secondary | ICD-10-CM | POA: Diagnosis not present

## 2022-01-12 DIAGNOSIS — I10 Essential (primary) hypertension: Secondary | ICD-10-CM | POA: Diagnosis not present

## 2022-01-12 DIAGNOSIS — I2510 Atherosclerotic heart disease of native coronary artery without angina pectoris: Secondary | ICD-10-CM | POA: Diagnosis not present

## 2022-01-12 DIAGNOSIS — E785 Hyperlipidemia, unspecified: Secondary | ICD-10-CM | POA: Diagnosis not present

## 2022-01-12 DIAGNOSIS — F329 Major depressive disorder, single episode, unspecified: Secondary | ICD-10-CM | POA: Diagnosis not present

## 2022-01-12 DIAGNOSIS — F0393 Unspecified dementia, unspecified severity, with mood disturbance: Secondary | ICD-10-CM | POA: Diagnosis not present

## 2022-01-12 DIAGNOSIS — I700 Atherosclerosis of aorta: Secondary | ICD-10-CM | POA: Diagnosis not present

## 2022-01-17 DIAGNOSIS — I10 Essential (primary) hypertension: Secondary | ICD-10-CM | POA: Diagnosis not present

## 2022-01-17 DIAGNOSIS — Z7902 Long term (current) use of antithrombotics/antiplatelets: Secondary | ICD-10-CM | POA: Diagnosis not present

## 2022-01-17 DIAGNOSIS — F329 Major depressive disorder, single episode, unspecified: Secondary | ICD-10-CM | POA: Diagnosis not present

## 2022-01-17 DIAGNOSIS — F03918 Unspecified dementia, unspecified severity, with other behavioral disturbance: Secondary | ICD-10-CM | POA: Diagnosis not present

## 2022-01-17 DIAGNOSIS — E785 Hyperlipidemia, unspecified: Secondary | ICD-10-CM | POA: Diagnosis not present

## 2022-01-17 DIAGNOSIS — F05 Delirium due to known physiological condition: Secondary | ICD-10-CM | POA: Diagnosis not present

## 2022-01-17 DIAGNOSIS — N400 Enlarged prostate without lower urinary tract symptoms: Secondary | ICD-10-CM | POA: Diagnosis not present

## 2022-01-17 DIAGNOSIS — Z9089 Acquired absence of other organs: Secondary | ICD-10-CM | POA: Diagnosis not present

## 2022-01-17 DIAGNOSIS — F0393 Unspecified dementia, unspecified severity, with mood disturbance: Secondary | ICD-10-CM | POA: Diagnosis not present

## 2022-01-17 DIAGNOSIS — I739 Peripheral vascular disease, unspecified: Secondary | ICD-10-CM | POA: Diagnosis not present

## 2022-01-17 DIAGNOSIS — M549 Dorsalgia, unspecified: Secondary | ICD-10-CM | POA: Diagnosis not present

## 2022-01-17 DIAGNOSIS — Z9181 History of falling: Secondary | ICD-10-CM | POA: Diagnosis not present

## 2022-01-17 DIAGNOSIS — Z87891 Personal history of nicotine dependence: Secondary | ICD-10-CM | POA: Diagnosis not present

## 2022-01-17 DIAGNOSIS — C679 Malignant neoplasm of bladder, unspecified: Secondary | ICD-10-CM | POA: Diagnosis not present

## 2022-01-17 DIAGNOSIS — I2510 Atherosclerotic heart disease of native coronary artery without angina pectoris: Secondary | ICD-10-CM | POA: Diagnosis not present

## 2022-01-17 DIAGNOSIS — Z8616 Personal history of COVID-19: Secondary | ICD-10-CM | POA: Diagnosis not present

## 2022-01-17 DIAGNOSIS — H9190 Unspecified hearing loss, unspecified ear: Secondary | ICD-10-CM | POA: Diagnosis not present

## 2022-01-17 DIAGNOSIS — I700 Atherosclerosis of aorta: Secondary | ICD-10-CM | POA: Diagnosis not present

## 2022-01-30 ENCOUNTER — Emergency Department (HOSPITAL_COMMUNITY): Payer: PPO

## 2022-01-30 ENCOUNTER — Other Ambulatory Visit: Payer: Self-pay

## 2022-01-30 ENCOUNTER — Inpatient Hospital Stay (HOSPITAL_COMMUNITY)
Admission: EM | Admit: 2022-01-30 | Discharge: 2022-02-01 | DRG: 065 | Disposition: A | Discharge status: Home Health | Payer: PPO | Attending: Family Medicine | Admitting: Family Medicine

## 2022-01-30 DIAGNOSIS — Z7902 Long term (current) use of antithrombotics/antiplatelets: Secondary | ICD-10-CM

## 2022-01-30 DIAGNOSIS — I639 Cerebral infarction, unspecified: Principal | ICD-10-CM | POA: Diagnosis present

## 2022-01-30 DIAGNOSIS — F0392 Unspecified dementia, unspecified severity, with psychotic disturbance: Secondary | ICD-10-CM | POA: Diagnosis present

## 2022-01-30 DIAGNOSIS — F0390 Unspecified dementia without behavioral disturbance: Secondary | ICD-10-CM | POA: Diagnosis present

## 2022-01-30 DIAGNOSIS — Z20822 Contact with and (suspected) exposure to covid-19: Secondary | ICD-10-CM | POA: Diagnosis not present

## 2022-01-30 DIAGNOSIS — R29810 Facial weakness: Secondary | ICD-10-CM | POA: Diagnosis present

## 2022-01-30 DIAGNOSIS — Z8551 Personal history of malignant neoplasm of bladder: Secondary | ICD-10-CM | POA: Diagnosis not present

## 2022-01-30 DIAGNOSIS — G319 Degenerative disease of nervous system, unspecified: Secondary | ICD-10-CM | POA: Diagnosis not present

## 2022-01-30 DIAGNOSIS — I6503 Occlusion and stenosis of bilateral vertebral arteries: Secondary | ICD-10-CM | POA: Diagnosis not present

## 2022-01-30 DIAGNOSIS — R29701 NIHSS score 1: Secondary | ICD-10-CM | POA: Diagnosis not present

## 2022-01-30 DIAGNOSIS — I739 Peripheral vascular disease, unspecified: Secondary | ICD-10-CM | POA: Diagnosis not present

## 2022-01-30 DIAGNOSIS — Z79899 Other long term (current) drug therapy: Secondary | ICD-10-CM | POA: Diagnosis not present

## 2022-01-30 DIAGNOSIS — F05 Delirium due to known physiological condition: Secondary | ICD-10-CM | POA: Diagnosis present

## 2022-01-30 DIAGNOSIS — J90 Pleural effusion, not elsewhere classified: Secondary | ICD-10-CM | POA: Diagnosis not present

## 2022-01-30 DIAGNOSIS — R442 Other hallucinations: Secondary | ICD-10-CM | POA: Diagnosis not present

## 2022-01-30 DIAGNOSIS — Z8051 Family history of malignant neoplasm of kidney: Secondary | ICD-10-CM | POA: Diagnosis not present

## 2022-01-30 DIAGNOSIS — H02402 Unspecified ptosis of left eyelid: Secondary | ICD-10-CM | POA: Diagnosis present

## 2022-01-30 DIAGNOSIS — E785 Hyperlipidemia, unspecified: Secondary | ICD-10-CM | POA: Diagnosis not present

## 2022-01-30 DIAGNOSIS — R42 Dizziness and giddiness: Secondary | ICD-10-CM | POA: Diagnosis not present

## 2022-01-30 DIAGNOSIS — R4182 Altered mental status, unspecified: Secondary | ICD-10-CM | POA: Diagnosis not present

## 2022-01-30 DIAGNOSIS — N400 Enlarged prostate without lower urinary tract symptoms: Secondary | ICD-10-CM | POA: Diagnosis not present

## 2022-01-30 DIAGNOSIS — J9811 Atelectasis: Secondary | ICD-10-CM | POA: Diagnosis not present

## 2022-01-30 DIAGNOSIS — D649 Anemia, unspecified: Secondary | ICD-10-CM | POA: Diagnosis not present

## 2022-01-30 DIAGNOSIS — I672 Cerebral atherosclerosis: Secondary | ICD-10-CM | POA: Diagnosis not present

## 2022-01-30 DIAGNOSIS — I6623 Occlusion and stenosis of bilateral posterior cerebral arteries: Secondary | ICD-10-CM | POA: Diagnosis not present

## 2022-01-30 DIAGNOSIS — I651 Occlusion and stenosis of basilar artery: Secondary | ICD-10-CM | POA: Diagnosis not present

## 2022-01-30 DIAGNOSIS — C679 Malignant neoplasm of bladder, unspecified: Secondary | ICD-10-CM | POA: Diagnosis present

## 2022-01-30 DIAGNOSIS — Z8711 Personal history of peptic ulcer disease: Secondary | ICD-10-CM

## 2022-01-30 DIAGNOSIS — Z888 Allergy status to other drugs, medicaments and biological substances status: Secondary | ICD-10-CM | POA: Diagnosis not present

## 2022-01-30 DIAGNOSIS — R410 Disorientation, unspecified: Secondary | ICD-10-CM | POA: Diagnosis not present

## 2022-01-30 DIAGNOSIS — I459 Conduction disorder, unspecified: Secondary | ICD-10-CM | POA: Diagnosis not present

## 2022-01-30 DIAGNOSIS — I63233 Cerebral infarction due to unspecified occlusion or stenosis of bilateral carotid arteries: Secondary | ICD-10-CM | POA: Diagnosis not present

## 2022-01-30 DIAGNOSIS — I10 Essential (primary) hypertension: Secondary | ICD-10-CM | POA: Diagnosis not present

## 2022-01-30 DIAGNOSIS — Z8249 Family history of ischemic heart disease and other diseases of the circulatory system: Secondary | ICD-10-CM | POA: Diagnosis not present

## 2022-01-30 DIAGNOSIS — R1310 Dysphagia, unspecified: Secondary | ICD-10-CM | POA: Diagnosis present

## 2022-01-30 DIAGNOSIS — I6389 Other cerebral infarction: Secondary | ICD-10-CM | POA: Diagnosis not present

## 2022-01-30 DIAGNOSIS — R531 Weakness: Secondary | ICD-10-CM | POA: Diagnosis not present

## 2022-01-30 DIAGNOSIS — I959 Hypotension, unspecified: Secondary | ICD-10-CM | POA: Diagnosis not present

## 2022-01-30 DIAGNOSIS — F03918 Unspecified dementia, unspecified severity, with other behavioral disturbance: Secondary | ICD-10-CM | POA: Diagnosis present

## 2022-01-30 LAB — COMPREHENSIVE METABOLIC PANEL
ALT: 9 U/L (ref 0–44)
AST: 19 U/L (ref 15–41)
Albumin: 3.1 g/dL — ABNORMAL LOW (ref 3.5–5.0)
Alkaline Phosphatase: 67 U/L (ref 38–126)
Anion gap: 8 (ref 5–15)
BUN: 17 mg/dL (ref 8–23)
CO2: 25 mmol/L (ref 22–32)
Calcium: 8.6 mg/dL — ABNORMAL LOW (ref 8.9–10.3)
Chloride: 103 mmol/L (ref 98–111)
Creatinine, Ser: 1.04 mg/dL (ref 0.61–1.24)
GFR, Estimated: >60 mL/min (ref >60)
Glucose, Bld: 107 mg/dL — ABNORMAL HIGH (ref 70–99)
Potassium: 4.3 mmol/L (ref 3.5–5.1)
Sodium: 136 mmol/L (ref 135–145)
Total Bilirubin: 0.4 mg/dL (ref 0.3–1.2)
Total Protein: 5.7 g/dL — ABNORMAL LOW (ref 6.5–8.1)

## 2022-01-30 LAB — I-STAT CHEM 8, ED
BUN: 18 mg/dL (ref 8–23)
Calcium, Ion: 1.23 mmol/L (ref 1.15–1.40)
Chloride: 103 mmol/L (ref 98–111)
Creatinine, Ser: 1 mg/dL (ref 0.61–1.24)
Glucose, Bld: 96 mg/dL (ref 70–99)
HCT: 32 % — ABNORMAL LOW (ref 39.0–52.0)
Hemoglobin: 10.9 g/dL — ABNORMAL LOW (ref 13.0–17.0)
Potassium: 4 mmol/L (ref 3.5–5.1)
Sodium: 140 mmol/L (ref 135–145)
TCO2: 28 mmol/L (ref 22–32)

## 2022-01-30 LAB — RAPID URINE DRUG SCREEN, HOSP PERFORMED
Amphetamines: NOT DETECTED
Barbiturates: NOT DETECTED
Benzodiazepines: NOT DETECTED
Cocaine: NOT DETECTED
Opiates: NOT DETECTED
Tetrahydrocannabinol: NOT DETECTED

## 2022-01-30 LAB — URINALYSIS, ROUTINE W REFLEX MICROSCOPIC
Bilirubin Urine: NEGATIVE
Glucose, UA: NEGATIVE mg/dL
Hgb urine dipstick: NEGATIVE
Ketones, ur: NEGATIVE mg/dL
Leukocytes,Ua: NEGATIVE
Nitrite: NEGATIVE
Protein, ur: NEGATIVE mg/dL
Specific Gravity, Urine: 1.026 (ref 1.005–1.030)
pH: 5 (ref 5.0–8.0)

## 2022-01-30 LAB — DIFFERENTIAL
Abs Immature Granulocytes: 0.03 10*3/uL (ref 0.00–0.07)
Basophils Absolute: 0 10*3/uL (ref 0.0–0.1)
Basophils Relative: 0 %
Eosinophils Absolute: 0.1 10*3/uL (ref 0.0–0.5)
Eosinophils Relative: 1 %
Immature Granulocytes: 0 %
Lymphocytes Relative: 10 %
Lymphs Abs: 0.7 10*3/uL (ref 0.7–4.0)
Monocytes Absolute: 0.7 10*3/uL (ref 0.1–1.0)
Monocytes Relative: 10 %
Neutro Abs: 5.4 10*3/uL (ref 1.7–7.7)
Neutrophils Relative %: 79 %

## 2022-01-30 LAB — CBC
HCT: 35.1 % — ABNORMAL LOW (ref 39.0–52.0)
Hemoglobin: 10.9 g/dL — ABNORMAL LOW (ref 13.0–17.0)
MCH: 28.3 pg (ref 26.0–34.0)
MCHC: 31.1 g/dL (ref 30.0–36.0)
MCV: 91.2 fL (ref 80.0–100.0)
Platelets: 334 10*3/uL (ref 150–400)
RBC: 3.85 MIL/uL — ABNORMAL LOW (ref 4.22–5.81)
RDW: 14.3 % (ref 11.5–15.5)
WBC: 6.9 10*3/uL (ref 4.0–10.5)
nRBC: 0 % (ref 0.0–0.2)

## 2022-01-30 LAB — PROTIME-INR
INR: 1.1 (ref 0.8–1.2)
Prothrombin Time: 14.4 seconds (ref 11.4–15.2)

## 2022-01-30 LAB — RESP PANEL BY RT-PCR (FLU A&B, COVID) ARPGX2
Influenza A by PCR: NEGATIVE
Influenza B by PCR: NEGATIVE
SARS Coronavirus 2 by RT PCR: NEGATIVE

## 2022-01-30 LAB — APTT: aPTT: 29 seconds (ref 24–36)

## 2022-01-30 LAB — ETHANOL: Alcohol, Ethyl (B): <10 mg/dL (ref <10)

## 2022-01-30 NOTE — ED Triage Notes (Signed)
Pt arrived via [facility] from home. Per EMS, pt called c/o dizziness and vertigo for approx 4 days. Family further reported to EMS that pt has had L. Leg weakness, increased confusion, and hallucinations today but noticed the leg weakness when pt got out of bed today after being in bed for 2 days. Pt has hx dementia and CVA. Caox2 at baseline (person and place) which is how pt presents to ED currently.

## 2022-01-30 NOTE — ED Notes (Signed)
Pt still in MRI 

## 2022-01-30 NOTE — ED Provider Notes (Signed)
[facility] EMERGENCY DEPARTMENT Provider Note   CSN: [id number] Arrival date & time: 01/30/22  1508     History  Chief Complaint  Patient presents with   Weakness   Dizziness    Paul [name] is a 88 y.o. male.  Patient brought in by EMS.  Family concerned about a possible stroke.  Patient's had a history of dizziness and some vertigo for the past 4 days but he occasionally gets that.  But with a noticed this morning which was not present yesterday that he had left leg weakness.  Also seemed to have some hallucinations today but he does have a history of dementia.  But this leg weakness was so severe he could not walk.  Patient is on Plavix and is on Aricept.  Patient is now moving his lower extremities well.  And feels that it is better now.  Past medical history is significant for hypertension hyperlipidemia dementia.      Home Medications Prior to Admission medications   Medication Sig Start Date End Date Taking? Authorizing Provider  acetaminophen (TYLENOL) 500 MG tablet Take 500 mg by mouth daily as needed for mild pain or headache.   Yes [provider]  clopidogrel (PLAVIX) 75 MG tablet Take 75 mg by mouth daily. 01/27/20  Yes [provider]  donepezil (ARICEPT) 5 MG tablet Take 5 mg by mouth daily.   Yes [provider]  escitalopram (LEXAPRO) 20 MG tablet Take 20 mg by mouth daily. 01/24/20  Yes [provider]  ezetimibe (ZETIA) 10 MG tablet Take 10 mg by mouth daily.   Yes [provider]  isosorbide mononitrate (IMDUR) 60 MG 24 hr tablet Take 60 mg by mouth every morning. 12/28/20  Yes [provider]  simvastatin (ZOCOR) 20 MG tablet Take 20 mg by mouth every evening.  02/27/12  [provider]      Allergies    Livalo [pitavastatin], Livalo [pitavastatin], and Other    Review of Systems   Review of Systems  Unable to perform ROS: Dementia   Physical Exam Updated Vital Signs BP  121/66    Pulse (!) 56    Temp 98.6 F (37 C) (Oral)    Resp 16    Ht 1.702 m (5\' 7" )    Wt 64.9 kg    SpO2 98%    BMI 22.40 kg/m  Physical Exam Vitals and nursing note reviewed.  Constitutional:      General: He is not in acute distress.    Appearance: Normal appearance. He is well-developed.  HENT:     Head: Normocephalic and atraumatic.  Eyes:     Conjunctiva/sclera: Conjunctivae normal.     Pupils: Pupils are equal, round, and reactive to light.  Cardiovascular:     Rate and Rhythm: Normal rate and regular rhythm.     Heart sounds: No murmur heard. Pulmonary:     Effort: Pulmonary effort is normal. No respiratory distress.     Breath sounds: Normal breath sounds.  Abdominal:     Palpations: Abdomen is soft.     Tenderness: There is no abdominal tenderness.  Musculoskeletal:        General: No swelling.     Cervical back: Normal range of motion and neck supple.  Skin:    General: Skin is warm and dry.     Capillary Refill: Capillary refill takes less than 2 seconds.  Neurological:     General: No focal deficit present.  Mental Status: He is alert. Mental status is at baseline.     Cranial Nerves: No cranial nerve deficit.     Sensory: No sensory deficit.     Motor: No weakness.     Comments: No obvious lower extremity weakness.  Patient not ambulated.  No facial droop.  But some droop to the left upper eyelid.  But the eyebrow goes up very well with good strength and also he can close his eyes tightly.  Psychiatric:        Mood and Affect: Mood normal.    ED Results / Procedures / Treatments   Labs (all labs ordered are listed, but only abnormal results are displayed) Labs Reviewed  CBC - Abnormal; Notable for the following components:      Result Value   RBC 3.85 (*)    Hemoglobin 10.9 (*)    HCT 35.1 (*)    All other components within normal limits  COMPREHENSIVE METABOLIC PANEL - Abnormal; Notable for the following components:   Glucose, Bld 107 (*)     Calcium 8.6 (*)    Total Protein 5.7 (*)    Albumin 3.1 (*)    All other components within normal limits  I-STAT CHEM 8, ED - Abnormal; Notable for the following components:   Hemoglobin 10.9 (*)    HCT 32.0 (*)    All other components within normal limits  RESP PANEL BY RT-PCR (FLU A&B, COVID) ARPGX2  ETHANOL  PROTIME-INR  APTT  DIFFERENTIAL  RAPID URINE DRUG SCREEN, HOSP PERFORMED  URINALYSIS, ROUTINE W REFLEX MICROSCOPIC    EKG None  Radiology CT HEAD WO CONTRAST  Result Date: 01/30/2022 CLINICAL DATA:  Weakness, dizziness, vertigo for 4 days EXAM: CT HEAD WITHOUT CONTRAST TECHNIQUE: Contiguous axial images were obtained from the base of the skull through the vertex without intravenous contrast. RADIATION DOSE REDUCTION: This exam was performed according to the departmental dose-optimization program which includes automated exposure control, adjustment of the mA and/or kV according to patient size and/or use of iterative reconstruction technique. COMPARISON:  09/04/2021 FINDINGS: Brain: Stable hypodensities within the periventricular white matter consistent with chronic small vessel ischemic changes. Diffuse cerebral atrophy and ex vacuo dilatation of the lateral ventricles unchanged. No evidence of acute infarct or hemorrhage. Remaining midline structures are unremarkable. No acute extra-axial fluid collections. No mass effect. Vascular: Stable atherosclerosis.  No hyperdense vessel. Skull: Normal. Negative for fracture or focal lesion. Sinuses/Orbits: No acute finding. Other: None. IMPRESSION: 1. Stable chronic small-vessel ischemic changes and cerebral atrophy. 2. No acute intracranial process. Electronically Signed   By: Randa Ngo M.D.   On: 01/30/2022 17:29   MR Brain Wo Contrast (neuro protocol)  Result Date: 01/30/2022 CLINICAL DATA:  Weakness, dizziness and vertigo EXAM: MRI HEAD WITHOUT CONTRAST TECHNIQUE: Multiplanar, multiecho pulse sequences of the brain and surrounding  structures were obtained without intravenous contrast. COMPARISON:  05/27/2016 FINDINGS: Brain: Punctate focus of abnormal diffusion restriction within the left medulla oblongata. No other diffusion abnormality. No acute or chronic hemorrhage. Hyperintense T2-weighted signal is widespread throughout the white matter. Diffuse, severe atrophy. The midline structures are normal. Vascular: Major flow voids are preserved. Skull and upper cervical spine: Normal calvarium and skull base. Visualized upper cervical spine and soft tissues are normal. Sinuses/Orbits:No paranasal sinus fluid levels or advanced mucosal thickening. No mastoid or middle ear effusion. Normal orbits. IMPRESSION: 1. Small acute infarct of the left medulla oblongata. No hemorrhage or mass effect. 2. Severe atrophy and chronic ischemic microangiopathy. Electronically  Signed   By: Ulyses Jarred M.D.   On: 01/30/2022 21:28   DG Chest Port 1 View  Result Date: 01/30/2022 CLINICAL DATA:  Altered mental status question stroke, leg weakness, increased confusion and hallucinations today EXAM: PORTABLE CHEST 1 VIEW COMPARISON:  Portable exam 1703 hours compared to 09/04/2021 FINDINGS: Normal heart size, mediastinal contours, and pulmonary vascularity. Atherosclerotic calcification aorta. Persistent LEFT pleural effusion and basilar atelectasis. Remaining lungs clear. No new areas of consolidation or pneumothorax. Bones demineralized. IMPRESSION: Persistent LEFT pleural effusion and basilar atelectasis. Aortic Atherosclerosis (ICD10-I70.0). Electronically Signed   By: Lavonia Dana M.D.   On: 01/30/2022 17:31    Procedures Procedures    Medications Ordered in ED Medications - No data to display  ED Course/ Medical Decision Making/ A&P                           Medical Decision Making Amount and/or Complexity of Data Reviewed Labs: ordered. Radiology: ordered.  Risk Decision regarding hospitalization.   CRITICAL CARE Performed by: Fredia Sorrow Total critical care time: 45 minutes Critical care time was exclusive of separately billable procedures and treating other patients. Critical care was necessary to treat or prevent imminent or life-threatening deterioration. Critical care was time spent personally by me on the following activities: development of treatment plan with patient and/or surrogate as well as nursing, discussions with consultants, evaluation of patient's response to treatment, examination of patient, obtaining history from patient or surrogate, ordering and performing treatments and interventions, ordering and review of laboratory studies, ordering and review of radiographic studies, pulse oximetry and re-evaluation of patient's condition.  Patient had stroke order set done.  But no definitive timeline of when this occurred.  Did have some symptoms of vertigo and dizziness for 4 days.  Lower extremity weakness noted this morning.  Went to bed yesterday finds the patient be out of the window for tPA.  Patient is on Plavix already.  Also the concern would be because of the confusion and may be hallucinations may be there was evidence of infection.  Head CT without any acute findings.  However MRI confirmed cva as above.  Lab work-up otherwise without any significant abnormalities.  CBC no leukocytosis hemoglobin 10.9.  Complete metabolic panel without any significant abnormalities renal function GFR is greater than 60 electrolytes normal liver function test normal.  Urine drug screen negative.  Urinalysis negative.  Alcohol negative COVID and influenza negative.  Gust with the neuro hospitalist and he is recommending hospitalist admission and they will consult.  For this CVA acute infarct.    Final Clinical Impression(s) / ED Diagnoses Final diagnoses:  Cerebrovascular accident (CVA), unspecified mechanism [facility])    Rx / DC Orders ED Discharge Orders     None         Fredia Sorrow, MD 01/30/22  2325

## 2022-01-30 NOTE — ED Notes (Signed)
This RN assisted patient to use urinal. Patient tolerated well. Urine sample sent to the lab via tube station at this time.

## 2022-01-31 ENCOUNTER — Observation Stay (HOSPITAL_COMMUNITY): Payer: PPO

## 2022-01-31 ENCOUNTER — Encounter (HOSPITAL_COMMUNITY): Payer: Self-pay | Admitting: Internal Medicine

## 2022-01-31 DIAGNOSIS — Z7902 Long term (current) use of antithrombotics/antiplatelets: Secondary | ICD-10-CM | POA: Diagnosis not present

## 2022-01-31 DIAGNOSIS — I639 Cerebral infarction, unspecified: Principal | ICD-10-CM

## 2022-01-31 DIAGNOSIS — I63233 Cerebral infarction due to unspecified occlusion or stenosis of bilateral carotid arteries: Secondary | ICD-10-CM | POA: Diagnosis not present

## 2022-01-31 DIAGNOSIS — Z8051 Family history of malignant neoplasm of kidney: Secondary | ICD-10-CM | POA: Diagnosis not present

## 2022-01-31 DIAGNOSIS — R531 Weakness: Secondary | ICD-10-CM | POA: Diagnosis present

## 2022-01-31 DIAGNOSIS — F05 Delirium due to known physiological condition: Secondary | ICD-10-CM | POA: Diagnosis present

## 2022-01-31 DIAGNOSIS — Z888 Allergy status to other drugs, medicaments and biological substances status: Secondary | ICD-10-CM | POA: Diagnosis not present

## 2022-01-31 DIAGNOSIS — I739 Peripheral vascular disease, unspecified: Secondary | ICD-10-CM | POA: Diagnosis present

## 2022-01-31 DIAGNOSIS — N400 Enlarged prostate without lower urinary tract symptoms: Secondary | ICD-10-CM | POA: Diagnosis present

## 2022-01-31 DIAGNOSIS — H02402 Unspecified ptosis of left eyelid: Secondary | ICD-10-CM | POA: Diagnosis present

## 2022-01-31 DIAGNOSIS — F0392 Unspecified dementia, unspecified severity, with psychotic disturbance: Secondary | ICD-10-CM | POA: Diagnosis present

## 2022-01-31 DIAGNOSIS — F0390 Unspecified dementia without behavioral disturbance: Secondary | ICD-10-CM

## 2022-01-31 DIAGNOSIS — R29701 NIHSS score 1: Secondary | ICD-10-CM | POA: Diagnosis present

## 2022-01-31 DIAGNOSIS — I6623 Occlusion and stenosis of bilateral posterior cerebral arteries: Secondary | ICD-10-CM | POA: Diagnosis not present

## 2022-01-31 DIAGNOSIS — Z20822 Contact with and (suspected) exposure to covid-19: Secondary | ICD-10-CM | POA: Diagnosis present

## 2022-01-31 DIAGNOSIS — I6503 Occlusion and stenosis of bilateral vertebral arteries: Secondary | ICD-10-CM | POA: Diagnosis not present

## 2022-01-31 DIAGNOSIS — I672 Cerebral atherosclerosis: Secondary | ICD-10-CM | POA: Diagnosis present

## 2022-01-31 DIAGNOSIS — I6389 Other cerebral infarction: Secondary | ICD-10-CM | POA: Diagnosis not present

## 2022-01-31 DIAGNOSIS — Z8551 Personal history of malignant neoplasm of bladder: Secondary | ICD-10-CM | POA: Diagnosis not present

## 2022-01-31 DIAGNOSIS — Z8249 Family history of ischemic heart disease and other diseases of the circulatory system: Secondary | ICD-10-CM | POA: Diagnosis not present

## 2022-01-31 DIAGNOSIS — Z8711 Personal history of peptic ulcer disease: Secondary | ICD-10-CM | POA: Diagnosis not present

## 2022-01-31 DIAGNOSIS — Z79899 Other long term (current) drug therapy: Secondary | ICD-10-CM | POA: Diagnosis not present

## 2022-01-31 DIAGNOSIS — I459 Conduction disorder, unspecified: Secondary | ICD-10-CM | POA: Diagnosis present

## 2022-01-31 DIAGNOSIS — J90 Pleural effusion, not elsewhere classified: Secondary | ICD-10-CM | POA: Diagnosis present

## 2022-01-31 DIAGNOSIS — R29810 Facial weakness: Secondary | ICD-10-CM | POA: Diagnosis present

## 2022-01-31 DIAGNOSIS — E785 Hyperlipidemia, unspecified: Secondary | ICD-10-CM | POA: Diagnosis not present

## 2022-01-31 DIAGNOSIS — I651 Occlusion and stenosis of basilar artery: Secondary | ICD-10-CM | POA: Diagnosis not present

## 2022-01-31 DIAGNOSIS — I10 Essential (primary) hypertension: Secondary | ICD-10-CM | POA: Diagnosis not present

## 2022-01-31 DIAGNOSIS — R1310 Dysphagia, unspecified: Secondary | ICD-10-CM | POA: Diagnosis present

## 2022-01-31 DIAGNOSIS — D649 Anemia, unspecified: Secondary | ICD-10-CM | POA: Diagnosis present

## 2022-01-31 LAB — VITAMIN B12: Vitamin B-12: 402 pg/mL (ref 180–914)

## 2022-01-31 LAB — ECHOCARDIOGRAM COMPLETE
AR max vel: 2.84 cm2
AV Peak grad: 5.4 mmHg
Ao pk vel: 1.16 m/s
Area-P 1/2: 3.1 cm2
Calc EF: 63 %
Height: 67 in
S' Lateral: 3 cm
Single Plane A2C EF: 61 %
Single Plane A4C EF: 64.4 %
Weight: 2288 oz

## 2022-01-31 LAB — LIPID PANEL
Cholesterol: 133 mg/dL (ref 0–200)
HDL: 39 mg/dL — ABNORMAL LOW (ref >40)
LDL Cholesterol: 68 mg/dL (ref 0–99)
Total CHOL/HDL Ratio: 3.4 RATIO
Triglycerides: 128 mg/dL (ref <150)
VLDL: 26 mg/dL (ref 0–40)

## 2022-01-31 LAB — CBC
HCT: 32.9 % — ABNORMAL LOW (ref 39.0–52.0)
Hemoglobin: 10.8 g/dL — ABNORMAL LOW (ref 13.0–17.0)
MCH: 29 pg (ref 26.0–34.0)
MCHC: 32.8 g/dL (ref 30.0–36.0)
MCV: 88.4 fL (ref 80.0–100.0)
Platelets: 274 10*3/uL (ref 150–400)
RBC: 3.72 MIL/uL — ABNORMAL LOW (ref 4.22–5.81)
RDW: 14 % (ref 11.5–15.5)
WBC: 6.3 10*3/uL (ref 4.0–10.5)
nRBC: 0 % (ref 0.0–0.2)

## 2022-01-31 LAB — FOLATE: Folate: 11.7 ng/mL (ref >5.9)

## 2022-01-31 LAB — IRON AND TIBC
Iron: 36 ug/dL — ABNORMAL LOW (ref 45–182)
Saturation Ratios: 15 % — ABNORMAL LOW (ref 17.9–39.5)
TIBC: 239 ug/dL — ABNORMAL LOW (ref 250–450)
UIBC: 203 ug/dL

## 2022-01-31 LAB — FERRITIN: Ferritin: 138 ng/mL (ref 24–336)

## 2022-01-31 LAB — CREATININE, SERUM
Creatinine, Ser: 0.95 mg/dL (ref 0.61–1.24)
GFR, Estimated: >60 mL/min (ref >60)

## 2022-01-31 LAB — RETICULOCYTES
Immature Retic Fract: 12.4 % (ref 2.3–15.9)
RBC.: 3.84 MIL/uL — ABNORMAL LOW (ref 4.22–5.81)
Retic Count, Absolute: 53.4 10*3/uL (ref 19.0–186.0)
Retic Ct Pct: 1.4 % (ref 0.4–3.1)

## 2022-01-31 LAB — HEMOGLOBIN A1C
Hgb A1c MFr Bld: 5.3 % (ref 4.8–5.6)
Mean Plasma Glucose: 105.41 mg/dL

## 2022-01-31 LAB — TSH: TSH: 2.837 u[IU]/mL (ref 0.350–4.500)

## 2022-01-31 MED ORDER — ACETAMINOPHEN 650 MG RE SUPP: 650.0000 mg | RECTAL | Status: DC | PRN

## 2022-01-31 MED ORDER — MELATONIN 5 MG PO TABS
5.0000 mg | ORAL_TABLET | Freq: Every day | ORAL | Status: DC
  Administered 2022-01-31: 5 mg via ORAL
  Filled 2022-01-31: qty 1

## 2022-01-31 MED ORDER — ACETAMINOPHEN 160 MG/5ML PO SOLN
650.0000 mg | ORAL | Status: DC | PRN
Start: 2022-01-31 — End: 2022-02-01

## 2022-01-31 MED ORDER — DONEPEZIL HCL 10 MG PO TABS
5.0000 mg | ORAL_TABLET | Freq: Every day | ORAL | Status: DC
  Administered 2022-01-31 – 2022-02-01 (×2): 5 mg via ORAL
  Filled 2022-01-31 (×3): qty 1

## 2022-01-31 MED ORDER — ACETAMINOPHEN 325 MG PO TABS
650.0000 mg | ORAL_TABLET | ORAL | Status: DC | PRN
  Administered 2022-01-31: 650 mg via ORAL
  Filled 2022-01-31: qty 2

## 2022-01-31 MED ORDER — OLANZAPINE 2.5 MG PO TABS
2.5000 mg | ORAL_TABLET | Freq: Every day | ORAL | Status: DC
  Administered 2022-01-31: 2.5 mg via ORAL
  Filled 2022-01-31: qty 1

## 2022-01-31 MED ORDER — IOHEXOL 350 MG/ML SOLN
75.0000 mL | Freq: Once | INTRAVENOUS | Status: AC | PRN
  Administered 2022-01-31: 75 mL via INTRAVENOUS

## 2022-01-31 MED ORDER — STROKE: EARLY STAGES OF RECOVERY BOOK
Freq: Once | Status: DC
  Filled 2022-01-31: qty 1

## 2022-01-31 MED ORDER — CLOPIDOGREL BISULFATE 75 MG PO TABS
75.0000 mg | ORAL_TABLET | Freq: Every day | ORAL | Status: DC
  Administered 2022-01-31 – 2022-02-01 (×2): 75 mg via ORAL
  Filled 2022-01-31 (×2): qty 1

## 2022-01-31 MED ORDER — EZETIMIBE 10 MG PO TABS
10.0000 mg | ORAL_TABLET | Freq: Every day | ORAL | Status: DC
  Administered 2022-01-31 – 2022-02-01 (×2): 10 mg via ORAL
  Filled 2022-01-31 (×3): qty 1

## 2022-01-31 MED ORDER — HALOPERIDOL LACTATE 5 MG/ML IJ SOLN
2.0000 mg | Freq: Four times a day (QID) | INTRAMUSCULAR | Status: DC | PRN
  Administered 2022-01-31: 2 mg via INTRAVENOUS
  Filled 2022-01-31: qty 1

## 2022-01-31 MED ORDER — SODIUM CHLORIDE 0.9 % IV SOLN: INTRAVENOUS | Status: DC

## 2022-01-31 MED ORDER — ESCITALOPRAM OXALATE 10 MG PO TABS
20.0000 mg | ORAL_TABLET | Freq: Every day | ORAL | Status: DC
  Administered 2022-01-31 – 2022-02-01 (×2): 20 mg via ORAL
  Filled 2022-01-31 (×2): qty 2

## 2022-01-31 MED ORDER — ASPIRIN EC 81 MG PO TBEC
81.0000 mg | DELAYED_RELEASE_TABLET | Freq: Every day | ORAL | Status: DC
  Administered 2022-01-31 – 2022-02-01 (×2): 81 mg via ORAL
  Filled 2022-01-31 (×2): qty 1

## 2022-01-31 MED ORDER — ENOXAPARIN SODIUM 40 MG/0.4ML IJ SOSY
40.0000 mg | PREFILLED_SYRINGE | INTRAMUSCULAR | Status: DC
  Administered 2022-01-31 – 2022-02-01 (×2): 40 mg via SUBCUTANEOUS
  Filled 2022-01-31 (×2): qty 0.4

## 2022-01-31 MED ORDER — MELATONIN 5 MG PO TABS
5.0000 mg | ORAL_TABLET | Freq: Once | ORAL | Status: AC
  Administered 2022-01-31: 5 mg via ORAL
  Filled 2022-01-31: qty 1

## 2022-01-31 MED ORDER — HYDRALAZINE HCL 20 MG/ML IJ SOLN
10.0000 mg | INTRAMUSCULAR | Status: DC | PRN
Start: 2022-01-31 — End: 2022-02-01

## 2022-01-31 NOTE — ED Notes (Signed)
Neurology at bedside.

## 2022-01-31 NOTE — TOC Initial Note (Addendum)
Transition of Care [facility]) - Initial/Assessment Note    Patient Details  Name: WHITFIELD DULAY MRN: [id number] Date of Birth: [birth year]  [facility] of [facility]) CM/SW Contact:    Pollie Friar, RN Phone Number: 01/31/2022, 4:12 PM  Clinical Narrative:                 Patient lives at home with his spouse and his son and daughter rotate providing supervision at the home. Family provides needed transportation. Wife and daughter provide patient his medications. Recommendations are for home health services. Pt was just discharged from [facility] home health last week. Cm has called Erline Levine with [facility] to see if they will pick him back up for services.  Wheelchair for home to be delivered to the room per [employer].  Daughter to reach out to patients insurance and the [place] to see if he has any caregiver benefits through his plans.  Pt has transport home when medically ready.  36: [facility] accepted for home health  Expected Discharge Plan: [place] Barriers to Discharge: Continued Medical Work up   Patient Goals and CMS Choice   CMS Medicare.gov Compare Post Acute Care list provided to:: Patient Represenative (must comment) Choice offered to / list presented to : Adult Children  Expected Discharge Plan and Services Expected Discharge Plan: [place]   Discharge Planning Services: CM Consult Post Acute Care Choice: Home Health, Durable Medical Equipment Living arrangements for the past 2 months: Single Family Home                 DME Arranged: Youth worker wheelchair with seat cushion DME Agency: [employer] Date DME Agency Contacted: 01/31/22   Representative spoke with at DME Agency: Freda Munro HH Arranged: PT, OT, Nurse's Aide, Speech Therapy          Prior Living Arrangements/Services Living arrangements for the past 2 months: Single Family Home Lives with:: Spouse, Adult Children Patient language and need for interpreter  reviewed:: Yes Do you feel safe going back to the place where you live?: Yes      Need for Family Participation in Patient Care: Yes (Comment) Care giver support system in place?: Yes (comment) Current home services: DME (walker/ 3 in 1/ shower seat/ rails in bathroom) Criminal Activity/Legal Involvement Pertinent to Current Situation/Hospitalization: No - Comment as needed  Activities of Daily Living      Permission Sought/Granted                  Emotional Assessment Appearance:: Appears stated age Attitude/Demeanor/Rapport: Engaged Affect (typically observed): Accepting Orientation: : Oriented to Self, Oriented to Place   Psych Involvement: No (comment)  Admission diagnosis:  Acute CVA (cerebrovascular accident) [facility]) [I63.9] Cerebrovascular accident (CVA), unspecified mechanism ([place]) [I63.9] Patient Active Problem List   Diagnosis Date Noted   Acute CVA (cerebrovascular accident) ([place]) 01/30/2022   Pressure injury of skin 09/05/2021   HLD (hyperlipidemia) 09/05/2021   Dementia without behavioral disturbance ([place]) 09/05/2021   COVID-19 virus infection 09/04/2021   Exertional dyspnea 08/28/2015   Depressive disorder 08/28/2015   PAD (peripheral artery disease) ([place]) 05/26/2012   Dyspnea 04/09/2012   Hypertension 04/09/2012   Claudication ([place]) 04/09/2012   Transitional cell carcinoma of bladder ([place]) 01/25/2012   PCP:  Crist Infante, MD Pharmacy:   [facility] ([facility] [place] [address] [place] ([place]) [place] [address] Phone: [phone] Fax: [phone]     Social Determinants  of Health (SDOH) Interventions    Readmission Risk Interventions No flowsheet data found.

## 2022-01-31 NOTE — Evaluation (Signed)
Clinical/Bedside Swallow Evaluation Patient Details  Name: Paul [name] MRN: [id number] Date of Birth: [birth year]  Today's Date: 01/31/2022 Time: SLP Start Time (ACUTE ONLY): 60 SLP Stop Time (ACUTE ONLY): 7 SLP Time Calculation (min) (ACUTE ONLY): 52 min  Past Medical History:  Past Medical History:  Diagnosis Date   Back pain    Bladder tumor    BPH (benign prostatic hyperplasia)    Hyperlipidemia    Hypertension    Impaired hearing BILATERAL HEARING AIDS   Personal history of gastric ulcer    Past Surgical History:  Past Surgical History:  Procedure Laterality Date   APPENDECTOMY  1957   CATARACT EXTRACTION W/ INTRAOCULAR LENS  IMPLANT, BILATERAL     CYSTOSCOPY WITH BIOPSY  03/08/2012   Procedure: CYSTOSCOPY WITH BIOPSY;  Surgeon: Claybon Jabs, MD;  Location: [facility];  Service: Urology;  Laterality: N/A;  gyrus   TONSILLECTOMY  CHILD   TRANSURETHRAL INCISION OF PROSTATE  03/08/2012   Procedure: TRANSURETHRAL INCISION OF THE PROSTATE (TUIP);  Surgeon: Claybon Jabs, MD;  Location: [facility];  Service: Urology;  Laterality: N/A;   TRANSURETHRAL RESECTION OF BLADDER TUMOR  01/26/2012   Procedure: TRANSURETHRAL RESECTION OF BLADDER TUMOR (TURBT);  Surgeon: Claybon Jabs, MD;  Location: [facility];  Service: Urology;  Laterality: N/A;  GYRUS MYTOMICIN C    HPI:  Paul [name] is an 88 y.o. male with a history of dementia, HTN, bladder CA in remission, gastric ulcer, admission for covid-19 in Sept 2022, who presented to the ED 2/16 with a few days of apraxia, abnormal sensation and weakness of the L > R leg as well as left eye/facial droop. MRI revealed stroke involving the left medulla. Pt passed the [place] upon arrival, but during SLP interview with pts daughter she reported a history of dysphagia and also a recent choking event after noticing initial stroke symptoms so a swallow eval was ordered. Pt had an OP MBS in 2019  due to frequent coughing and choking with solids. MBS showed residue due to large cervical osteophyte at C3-6 and CP bar. Pt did not have any aspiration during eval.    Assessment / Plan / Recommendation  Clinical Impression  Swallow eval suggested though pt passed [place], given history of dysphagia and new Left lateral medullary CVA. Pts daughter reported pt frequently coughed during meals, and chart reviewed revealed a baseline structural dyspahgia found on OP MBS with cervical osteophyte and CP bar, but no aspiration. Under observation pt found to have a mild mild left facial droop at rest and question of decreased left palatal elevation. Resonance slightly hypernasal. Pt tolerated large consecutive swallows without coughing and also consumed most of lunch meal (shredded beef with gravy) without difficulty. No coughing or signs of dysphagia. Discussed potential for worsened swallow function given stroke and prior impairment. Encouraged basic aspiration precautions and suggested softening and moistening food if problems arise. Pt may benefit from an OP MBS in the future if problem worsens. He will d/c tomorrow per daughter so this can be deferred. Pt to continue current diet with precautions. Home health SLP suggested for f/u with dysphagia and also cognitive impairment from dementia. SLP Visit Diagnosis: Dysphagia, oropharyngeal phase (R13.12)    Aspiration Risk  Moderate aspiration risk    Diet Recommendation Regular;Thin liquid;Dysphagia 3 (Mech soft)   Liquid Administration via: Cup;Straw Medication Administration: Whole meds with liquid Supervision: Patient able to self feed Compensations: Slow rate;Small sips/bites Postural  Changes: Seated upright at 90 degrees    Other  Recommendations      Recommendations for follow up therapy are one component of a multi-disciplinary discharge planning process, led by the attending physician.  Recommendations may be updated based on patient status,  additional functional criteria and insurance authorization.  Follow up Recommendations Home health SLP      Assistance Recommended at Discharge Set up Supervision/Assistance  Functional Status Assessment Patient has had a recent decline in their functional status and demonstrates the ability to make significant improvements in function in a reasonable and predictable amount of time.  Frequency and Duration            Prognosis        Swallow Study   General HPI: Paul [name] is an 88 y.o. male with a history of dementia, HTN, bladder CA in remission, gastric ulcer, admission for covid-19 in Sept 2022, who presented to the ED 2/16 with a few days of apraxia, abnormal sensation and weakness of the L > R leg as well as left eye/facial droop. MRI revealed stroke involving the left medulla. Pt passed the [place] upon arrival, but during SLP interview with pts daughter she reported a history of dysphagia and also a recent choking event after noticing initial stroke symptoms so a swallow eval was ordered. Pt had an OP MBS in 2019 due to frequent coughing and choking with solids. MBS showed residue due to large cervical osteophyte at C3-6 and CP bar. Pt did not have any aspiration during eval. Type of Study: Bedside Swallow Evaluation Previous Swallow Assessment: see HPI Diet Prior to this Study: NPO Temperature Spikes Noted: No History of Recent Intubation: No Behavior/Cognition: Alert;Cooperative;Pleasant mood Oral Cavity Assessment: Within Functional Limits Oral Care Completed by SLP: No Oral Cavity - Dentition: Dentures, top;Edentulous;Dentures, bottom Self-Feeding Abilities: Able to feed self Patient Positioning: Upright in bed Baseline Vocal Quality: Suspected CN X (Vagus) involvement (Pt with abnormal resonance) Volitional Cough: Strong Volitional Swallow: Able to elicit    Oral/Motor/Sensory Function Overall Oral Motor/Sensory Function: Mild impairment Facial ROM: Within Functional  Limits Facial Symmetry: Abnormal symmetry left;Suspected CN VII (facial) dysfunction Facial Strength: Within Functional Limits Lingual ROM: Within Functional Limits Lingual Symmetry: Within Functional Limits Lingual Strength: Within Functional Limits Lingual Sensation: Within Functional Limits Velum: Impaired left;Suspected CN X (Vagus) dysfunction   Ice Chips     Thin Liquid Thin Liquid: Within functional limits    Nectar Thick Nectar Thick Liquid: Not tested   Honey Thick Honey Thick Liquid: Not tested   Puree Puree: Within functional limits   Solid     Solid: Within functional limits      Nikala Walsworth, Katherene Ponto 01/31/2022,1:46 PM

## 2022-01-31 NOTE — Consult Note (Signed)
NEUROLOGY CONSULTATION NOTE   Date of service: January 31, 2022 Patient Name: Paul [name] MRN:  [id number] DOB:  1930-03-22 Reason for consult: "L medullary stroke" Requesting Provider: Rise Patience, MD _ _ _   _ __   _ __ _ _  __ __   _ __   __ _  History of Present Illness  Paul [name] is a 88 y.o. male with PMH significant for BPH, HTN, HLD, gastric ulcers who presents with 4 days of vertigo and dizziness and has been in the bed for over 24 hours. He has also been having hallucinations that are very bothersome and required a lot of redirection from family. When the family got him up to walk, he was noted to be dragging his L foot.  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  He takes plavix 75mg  daily at home, has had muscle cramps with Atorvastatin in the past and has dementia and is usually oriented x 2 at baseline. Daughter provides much of the history.  LKW: 01/29/22 mRS: 4 tNKASE/Thrombectomy: not offered, outside the window. NIHSS components Score: Comment  1a Level of Conscious 0[x]  1[]  2[]  3[]      1b LOC Questions 0[]  1[x]  2[]       1c LOC Commands 0[x]  1[]  2[]       2 Best Gaze 0[x]  1[]  2[]       3 Visual 0[x]  1[]  2[]  3[]      4 Facial Palsy 0[x]  1[]  2[]  3[]      5a Motor Arm - left 0[x]  1[]  2[]  3[]  4[]  UN[]    5b Motor Arm - Right 0[x]  1[]  2[]  3[]  4[]  UN[]    6a Motor Leg - Left 0[x]  1[]  2[]  3[]  4[]  UN[]    6b Motor Leg - Right 0[x]  1[]  2[]  3[]  4[]  UN[]    7 Limb Ataxia 0[]  1[x]  2[]  3[]  UN[]     8 Sensory 0[x]  1[]  2[]  UN[]      9 Best Language 0[x]  1[]  2[]  3[]      10 Dysarthria 0[x]  1[]  2[]  UN[]      11 Extinct. and Inattention 0[x]  1[]  2[]       TOTAL: 2     ROS   Constitutional Denies weight loss, fever and chills.   HEENT Denies changes in vision and hearing.   Respiratory Denies SOB and cough.   CV Denies palpitations and CP   GI Denies abdominal pain, nausea, vomiting and diarrhea.   GU Denies dysuria and  urinary frequency.   MSK Denies myalgia and joint pain.   Skin Denies rash and pruritus.   Neurological Denies headache and syncope.   Psychiatric Denies recent changes in mood. Denies anxiety and depression.    Past History   Past Medical History:  Diagnosis Date   Back pain    Bladder tumor    BPH (benign prostatic hyperplasia)    Hyperlipidemia    Hypertension    Impaired hearing BILATERAL HEARING AIDS   Personal history of gastric ulcer    Past Surgical History:  Procedure Laterality Date   APPENDECTOMY  1957   CATARACT EXTRACTION W/ INTRAOCULAR LENS  IMPLANT, BILATERAL     CYSTOSCOPY WITH BIOPSY  03/08/2012   Procedure: CYSTOSCOPY WITH BIOPSY;  Surgeon: Claybon Jabs, MD;  Location: [facility];  Service: Urology;  Laterality: N/A;  gyrus   TONSILLECTOMY  CHILD   TRANSURETHRAL INCISION OF PROSTATE  03/08/2012   Procedure: TRANSURETHRAL INCISION OF THE PROSTATE (  TUIP);  Surgeon: Claybon Jabs, MD;  Location: [facility];  Service: Urology;  Laterality: N/A;   TRANSURETHRAL RESECTION OF BLADDER TUMOR  01/26/2012   Procedure: TRANSURETHRAL RESECTION OF BLADDER TUMOR (TURBT);  Surgeon: Claybon Jabs, MD;  Location: [facility];  Service: Urology;  Laterality: N/A;  GYRUS MYTOMICIN C    Family History  Problem Relation Age of Onset   Kidney cancer Mother    Hypertension Father    Social History   Socioeconomic History   Marital status: Married    Spouse name: Not on file   Number of children: 3   Years of education: Not on file   Highest education level: Not on file  Occupational History   Not on file  Tobacco Use   Smoking status: Former    Years: 30.00    Types: Cigarettes    Quit date: 01/20/1981    Years since quitting: 41.0   Smokeless tobacco: Never  Substance and Sexual Activity   Alcohol use: No   Drug use: No   Sexual activity: Not on file  Other Topics Concern   Not on file  Social History Narrative    Not on file   Social Determinants of Health   Financial Resource Strain: Not on file  Food Insecurity: Not on file  Transportation Needs: Not on file  Physical Activity: Not on file  Stress: Not on file  Social Connections: Not on file   Allergies  Allergen Reactions   Livalo [Pitavastatin] Other (See Comments)    Cramping and back pain   Livalo [Pitavastatin] Other (See Comments)    Cramping and back pain   Other Diarrhea and Other (See Comments)    Unnamed patch and tablets for dementia caused diarrhea (Possibly galantamine, from outside source)     Medications  (Not in a hospital admission)    Vitals   Vitals:   01/31/22 0000 01/31/22 0015 01/31/22 0100 01/31/22 0115  BP: (!) 108/46  (!) 102/54   Pulse: 61 (!) 59 (!) 58 (!) 58  Resp: 20 17 18 19   Temp:      TempSrc:      SpO2: 97% 98% 97% 97%  Weight:      Height:         Body mass index is 22.4 kg/m.  Physical Exam   General: Laying comfortably in bed; in no acute distress.  HENT: Normal oropharynx and mucosa. Normal external appearance of ears and nose.  Neck: Supple, no pain or tenderness  CV: No JVD. No peripheral edema.  Pulmonary: Symmetric Chest rise. Normal respiratory effort.  Abdomen: Soft to touch, non-tender.  Ext: No cyanosis, edema, or deformity  Skin: No rash. Normal palpation of skin.   Musculoskeletal: Normal digits and nails by inspection. No clubbing.   Neurologic Examination  Mental status/Cognition: Alert, oriented to self, place, month and year, good attention.  Speech/language: Fluent, comprehension intact, object naming intact, repetition intact.  Cranial nerves:   CN II Pupils equal and reactive to light, no VF deficits    CN III,IV,VI EOM intact, no gaze preference or deviation, no nystagmus    CN V normal sensation in V1, V2, and V3 segments bilaterally    CN VII no asymmetry, no nasolabial fold flattening    CN VIII Hard of hearing.   CN IX & X normal palatal elevation, no  uvular deviation    CN XI 5/5 head turn and 5/5 shoulder shrug bilaterally  CN XII midline tongue protrusion    Motor:  Muscle bulk: poor, tone normal, pronator drift none tremor none Mvmt Root Nerve  Muscle Right Left Comments  SA C5/6 Ax Deltoid 5 5   EF C5/6 Mc Biceps 5 5   EE C6/7/8 Rad Triceps 5 5   WF C6/7 Med FCR     WE C7/8 PIN ECU     F Ab C8/T1 U ADM/FDI 5 4+   HF L1/2/3 Fem Illopsoas 5 4+   KE L2/3/4 Fem Quad 5 5   DF L4/5 D Peron Tib Ant 5 4+   PF S1/2 Tibial Grc/Sol 5 5    Reflexes:  Right Left Comments  Pectoralis      Biceps (C5/6) 1 1   Brachioradialis (C5/6) 1 1    Triceps (C6/7) 1 1    Patellar (L3/4) 1 1    Achilles (S1)      Hoffman      Plantar     Jaw jerk    Sensation:  Light touch Intact througout   Pin prick    Temperature    Vibration   Proprioception    Coordination/Complex Motor:  - Finger to Nose intact BL - Heel to shin with ataxia in LLE. - Rapid alternating movement are slowed in LUE - Gait: Deferred for patient safety.  Labs   CBC:  Recent Labs  Lab 01/30/22 1701 01/30/22 1716  WBC 6.9  --   NEUTROABS 5.4  --   HGB 10.9* 10.9*  HCT 35.1* 32.0*  MCV 91.2  --   PLT 334  --     Basic Metabolic Panel:  Lab Results  Component Value Date   NA 140 01/30/2022   K 4.0 01/30/2022   CO2 25 01/30/2022   GLUCOSE 96 01/30/2022   BUN 18 01/30/2022   CREATININE 1.00 01/30/2022   CALCIUM 8.6 (L) 01/30/2022   GFRNONAA >60 01/30/2022   Lipid Panel: No results found for: LDLCALC HgbA1c: No results found for: HGBA1C Urine Drug Screen:     Component Value Date/Time   LABOPIA NONE DETECTED 01/30/2022 1701   COCAINSCRNUR NONE DETECTED 01/30/2022 1701   LABBENZ NONE DETECTED 01/30/2022 1701   AMPHETMU NONE DETECTED 01/30/2022 1701   THCU NONE DETECTED 01/30/2022 1701   LABBARB NONE DETECTED 01/30/2022 1701    Alcohol Level     Component Value Date/Time   ETH <10 01/30/2022 1628    CT Head without contrast(Personally  reviewed): CTH was negative for a large hypodensity concerning for a large territory infarct or hyperdensity concerning for an ICH  CT angio Head and Neck with contrast(Personally reviewed): Multifocal posterior circulation occlusion including distal basilar artery, left PCA P2 segment, Right vertebral artery V1 adnd proximal V2 segments.  MRI Brain(Personally reviewed): Small left medullary stroke Impression   Paul [name] is a 88 y.o. male with PMH significant for BPH, HTN, HLD, gastric ulcers who presents with 4 days of vertigo and dizziness and has been in the bed for over 24 hours. When the family got him up to walk, he was noted to be dragging his L foot. He was found to have a small left medullary stroke on MRI Brain.  CTA with multifocal multivessel stenosis in the posterior circulation with distal basilar occlusion. Suspect that this is chronic likely due to poorly controlled risk factors. He has had trouble tolerating statins in the past and his stroke appears to be most consistent with a perforator artery stroke.  Primary Diagnosis:  Other cerebral infarction due to occlusion of stenosis of small artery.  Secondary Diagnosis: Essential (primary) hypertension  Recommendations  Plan:  - Frequent Neuro checks per stroke unit protocol - Recommend Vascular imaging with CT Angio head and neck - Recommend obtaining TTE - Recommend obtaining Lipid panel with LDL - Please start Zetia if LDL > 70. Has had muscle cramps with atorvastatin in the past. - Recommend HbA1c - Antithrombotic - Aspirin 81mg  daily along with plavix 75mg  daily x 90 days, followed by plavix 75mg  daily alone. - Recommend DVT ppx - SBP goal - permissive hypertension first 24 h < 220/110. Held home meds.  - Recommend Telemetry monitoring for arrythmia - Recommend bedside swallow screen prior to PO intake. - Stroke education booklet - Recommend PT/OT/SLP  consult  ______________________________________________________________________  Plan discussed with Dr. Hal Hope.  Thank you for the opportunity to take part in the care of this patient. If you have any further questions, please contact the neurology consultation attending.  Signed,  [place] Pager Number [phone] _ _ _   _ __   _ __ _ _  __ __   _ __   __ _

## 2022-01-31 NOTE — ED Notes (Signed)
Dr. Kakrakandy at bedside. 

## 2022-01-31 NOTE — ED Notes (Signed)
Pt and daughter requesting something to help pt sleep - pt has not been able to rest all night - MD Deatra James by this RN

## 2022-01-31 NOTE — Evaluation (Signed)
Speech Language Pathology Evaluation Patient Details Name: Paul [name] MRN: [id number] DOB: [birth year] Today's Date: 01/31/2022 Time: 5809-9833 SLP Time Calculation (min) (ACUTE ONLY): 15 min  Problem List:  Patient Active Problem List   Diagnosis Date Noted   Acute CVA (cerebrovascular accident) ([place]) 01/30/2022   Pressure injury of skin 09/05/2021   HLD (hyperlipidemia) 09/05/2021   Dementia without behavioral disturbance ([place]) 09/05/2021   COVID-19 virus infection 09/04/2021   Exertional dyspnea 08/28/2015   Depressive disorder 08/28/2015   PAD (peripheral artery disease) ([place]) 05/26/2012   Dyspnea 04/09/2012   Hypertension 04/09/2012   Claudication ([employer]) 04/09/2012   Transitional cell carcinoma of bladder ([place]) 01/25/2012   Past Medical History:  Past Medical History:  Diagnosis Date   Back pain    Bladder tumor    BPH (benign prostatic hyperplasia)    Hyperlipidemia    Hypertension    Impaired hearing BILATERAL HEARING AIDS   Personal history of gastric ulcer    Past Surgical History:  Past Surgical History:  Procedure Laterality Date   APPENDECTOMY  1957   CATARACT EXTRACTION W/ INTRAOCULAR LENS  IMPLANT, BILATERAL     CYSTOSCOPY WITH BIOPSY  03/08/2012   Procedure: CYSTOSCOPY WITH BIOPSY;  Surgeon: Claybon Jabs, MD;  Location: [facility];  Service: Urology;  Laterality: N/A;  gyrus   TONSILLECTOMY  CHILD   TRANSURETHRAL INCISION OF PROSTATE  03/08/2012   Procedure: TRANSURETHRAL INCISION OF THE PROSTATE (TUIP);  Surgeon: Claybon Jabs, MD;  Location: [facility];  Service: Urology;  Laterality: N/A;   TRANSURETHRAL RESECTION OF BLADDER TUMOR  01/26/2012   Procedure: TRANSURETHRAL RESECTION OF BLADDER TUMOR (TURBT);  Surgeon: Claybon Jabs, MD;  Location: [facility];  Service: Urology;  Laterality: N/A;  GYRUS MYTOMICIN C    HPI:  Paul [name] is an 88 y.o. male presented to the ED 2/16 with a few days of  apraxia, abnormal sensation and weakness of the L > R leg as well as left eye/facial droop. MRI revealed stroke involving the left medulla. CTA of the head/neck showed multiple posterior circulation stenoses with occlusion, suspected to be chronic per neurology. History of dementia, HTN, bladder CA in remission, gastric ulcer.   Assessment / Plan / Recommendation Clinical Impression  Paul [name] was seen at bedside for cognitive-linguistic evaluation. Patient's daughter at bedside and reports that the patient has baseline deficits in memory (i.e., dementia). Daughter also verbalized that since admission, the patient's primary cognitive deficit has been with hallucinations. Cognition informally assessed and observed during novel mealtime. Patient engaged in adequate mealtime sequence of events and problem solving strategies independently and spontaneously. Patient did not exhibit deficits in attention, problem solving, or executive function. SLP noted slight hyponasality and flat affect with speech-related tasks. Daughter verbalzied this is the patient's baseline. Patient is not showing signs of advanced cognitive-linguistic deficits from baseline. SLP to sign off as patient has returned to previous level of functioning. SLP recommending Home Health SLP services to address baseline memory/cognitive deficits in least restrictive environment for the patient.    SLP Assessment  SLP Recommendation/Assessment: All further Speech Lanaguage Pathology  needs can be addressed in the next venue of care SLP Visit Diagnosis: Cognitive communication deficit (R41.841)    Recommendations for follow up therapy are one component of a multi-disciplinary discharge planning process, led by the attending physician.  Recommendations may be updated based on patient status, additional functional criteria and insurance authorization.    Follow  Up Recommendations  No SLP follow up    Assistance Recommended at Discharge  Set up  Supervision/Assistance  Functional Status Assessment Patient has had a recent decline in their functional status and demonstrates the ability to make significant improvements in function in a reasonable and predictable amount of time.  Frequency and Duration           SLP Evaluation Cognition  Overall Cognitive Status: History of cognitive impairments - at baseline Arousal/Alertness: Awake/alert Orientation Level: Oriented to person;Oriented to place Attention: Focused Focused Attention: Appears intact Memory: Impaired Memory Impairment: Storage deficit;Retrieval deficit;Decreased recall of new information Awareness: Appears intact Problem Solving: Appears intact Safety/Judgment: Appears intact       Comprehension  Auditory Comprehension Overall Auditory Comprehension: Appears within functional limits for tasks assessed Yes/No Questions: Not tested Commands: Within Functional Limits Conversation: Simple Interfering Components: Working Field seismologist: Tour manager: Not tested Reading Comprehension Reading Status: Not tested    Expression Expression Primary Mode of Expression: Verbal Verbal Expression Overall Verbal Expression: Appears within functional limits for tasks assessed Initiation: No impairment Automatic Speech: Social Response;Name Level of Generative/Spontaneous Verbalization: Conversation Repetition:  (Not Tested) Naming: Not tested Pragmatics: No impairment Effective Techniques: Open ended questions Non-Verbal Means of Communication: Not applicable Written Expression Written Expression: Not tested   Oral / Motor  Oral Motor/Sensory Function Overall Oral Motor/Sensory Function: Within functional limits Motor Speech Overall Motor Speech: Impaired at baseline Respiration: Within functional limits Phonation: Normal Resonance: Hyponasality Articulation: Within functional  limitis Intelligibility: Intelligible Motor Planning: Witnin functional limits Motor Speech Errors: Not applicable            [employer] 01/31/2022, 1:33 PM

## 2022-01-31 NOTE — Progress Notes (Signed)
PROGRESS NOTE  Brief Narrative: Paul [name] is an 88 y.o. male with a history of dementia, HTN, bladder CA in remission, gastric ulcer, admission for covid-19 in Sept 2022, who presented to the ED 2/16 with a few days of apraxia, abnormal sensation and weakness of the L > R leg as well as left eye/facial droop. MRI revealed stroke involving the left medulla. CTA of the head/neck showed multiple posterior circulation stenoses with occlusion, suspected to be chronic per neurology. Patient admitted for further stroke work-up. He passed a swallow screen but he and his daughter report significant choking spells, so he will be restricted diet until SLP evaluation and remain on IVF.  Subjective: Pt alert and interactive, still felt like his left leg doesn't cooperate with messages his brain sends. He has intermittent double vision as well, not currently. Suffered significant hallucinations overnight, typical of prior hospitalizations.   Objective: BP 134/63 (BP Location: Right Arm)    Pulse (!) 54    Temp 98 F (36.7 C)    Resp 18    Ht 5\' 7"  (1.702 m)    Wt 64.9 kg    SpO2 99%    BMI 22.40 kg/m   Gen: Well-appearing elderly male sitting in chair in no distress Pulm: Clear and nonlabored on room air  CV: RRR, no murmur, no JVD, no edema GI: Soft, NT, ND, +BS  Neuro: Alert and oriented. 4/5 LLE hip flexion, 5/5 otherwise, some apraxia of left leg, delayed motor functioning. No nystagmus, CN's intact. No new focal deficits. Skin: No rashes, lesions or ulcers on visualized skin  Assessment & Plan: Acute CVA of left medulla: Likely perforator artery ischemic infarct from progressive atherosclerotic cerebrovascular disease.  - D/w Dr. Erlinda Hong who will formally evaluate. - Continue plavix, added aspirin.  - Continue zetia (statin intolerant), LDL is at goal at 68. - Echocardiogram pending - PT recommends home health. OT and SLP pending.   Dementia with acute delirium, sundowning: Has predictable pattern  of delirium when hospitalized. This would also be exacerbated by placing in facility, so plan is to abbreviate hospital stay as much as possible and return home with assistance. Per PT, suspects he would be well served by an additional session tomorrow morning.  - Continue aricept - Delirium precautions. D/w daughter who can stay overnight to assist with redirection.  - Will give melatonin qHS and make zyprexa available prn qHS  Dysphagia, history of esophageal stricture:  - SLP evaluation pending.   Hypertension:  - Hold imdur for permissive HTN. Recently taken of lisinopril and says cough has improved.   Sinus bradycardia:  - Consider holding aricept if becomes severe or symptomatic.   Anemia: No bleeding noted, will monitor.   History of PAD: Continue antiplatelet, lipid therapy. No critical ischemia noted.  Persistent left pleural effusion seen on chest x-ray will need further work-up as outpatient. No hypoxia.   History of bladder cancer in remission.  Paul Pour, MD Pager on amion 01/31/2022, 12:12 PM

## 2022-01-31 NOTE — ED Notes (Signed)
Pt still wanting to take melatonin now - melatonin given - pt is having some visual hallucinations, seeing things on the ceiling and things floating around the room - pt daughter reports that he sometimes has these hallucinations - pt remains AO to baseline and calm and cooperative and pleasant at this time

## 2022-01-31 NOTE — Progress Notes (Signed)
STROKE TEAM PROGRESS NOTE   SUBJECTIVE (INTERVAL HISTORY) His daughter is at the bedside.  Overall his condition is gradually improving.  Patient sitting in chair, able to drink thin liquid with occasional coughing but eating solid food well.  Speech no hoarseness, complaining of gait imbalance on walking but otherwise strength maintained.   OBJECTIVE Temp:  [97.6 F (36.4 C)-98.4 F (36.9 C)] 98.4 F (36.9 C) (02/17 1946) Pulse Rate:  [53-66] 64 (02/17 1946) Cardiac Rhythm: Heart block (02/17 0719) Resp:  [10-20] 16 (02/17 1946) BP: (102-171)/(46-73) 171/68 (02/17 1946) SpO2:  [95 %-100 %] 99 % (02/17 1946)  No results for input(s): GLUCAP in the last 168 hours. Recent Labs  Lab 01/30/22 1701 01/30/22 1716 01/31/22 0250  NA 136 140  --   K 4.3 4.0  --   CL 103 103  --   CO2 25  --   --   GLUCOSE 107* 96  --   BUN 17 18  --   CREATININE 1.04 1.00 0.95  CALCIUM 8.6*  --   --    Recent Labs  Lab 01/30/22 1701  AST 19  ALT 9  ALKPHOS 67  BILITOT 0.4  PROT 5.7*  ALBUMIN 3.1*   Recent Labs  Lab 01/30/22 1701 01/30/22 1716 01/31/22 0250  WBC 6.9  --  6.3  NEUTROABS 5.4  --   --   HGB 10.9* 10.9* 10.8*  HCT 35.1* 32.0* 32.9*  MCV 91.2  --  88.4  PLT 334  --  274   No results for input(s): CKTOTAL, CKMB, CKMBINDEX, TROPONINI in the last 168 hours. Recent Labs    01/30/22 1701  LABPROT 14.4  INR 1.1   Recent Labs    01/30/22 1701  COLORURINE YELLOW  LABSPEC 1.026  PHURINE 5.0  GLUCOSEU NEGATIVE  HGBUR NEGATIVE  BILIRUBINUR NEGATIVE  KETONESUR NEGATIVE  PROTEINUR NEGATIVE  NITRITE NEGATIVE  LEUKOCYTESUR NEGATIVE       Component Value Date/Time   CHOL 133 01/31/2022 0250   TRIG 128 01/31/2022 0250   HDL 39 (L) 01/31/2022 0250   CHOLHDL 3.4 01/31/2022 0250   VLDL 26 01/31/2022 0250   LDLCALC 68 01/31/2022 0250   Lab Results  Component Value Date   HGBA1C 5.3 01/31/2022      Component Value Date/Time   LABOPIA NONE DETECTED 01/30/2022 1701    COCAINSCRNUR NONE DETECTED 01/30/2022 1701   LABBENZ NONE DETECTED 01/30/2022 1701   AMPHETMU NONE DETECTED 01/30/2022 1701   THCU NONE DETECTED 01/30/2022 1701   LABBARB NONE DETECTED 01/30/2022 1701    Recent Labs  Lab 01/30/22 1628  ETH <10    I have personally reviewed the radiological images below and agree with the radiology interpretations.  CT ANGIO HEAD NECK W WO CM  Result Date: 01/31/2022 CLINICAL DATA:  Stroke/TIA EXAM: CT ANGIOGRAPHY HEAD AND NECK TECHNIQUE: Multidetector CT imaging of the head and neck was performed using the standard protocol during bolus administration of intravenous contrast. Multiplanar CT image reconstructions and MIPs were obtained to evaluate the vascular anatomy. Carotid stenosis measurements (when applicable) are obtained utilizing NASCET criteria, using the distal internal carotid diameter as the denominator. RADIATION DOSE REDUCTION: This exam was performed according to the departmental dose-optimization program which includes automated exposure control, adjustment of the mA and/or kV according to patient size and/or use of iterative reconstruction technique. CONTRAST:  77mL OMNIPAQUE IOHEXOL 350 MG/ML SOLN COMPARISON:  None. FINDINGS: CTA NECK FINDINGS SKELETON: There is no bony spinal canal stenosis.  No lytic or blastic lesion. OTHER NECK: Normal pharynx, larynx and major salivary glands. No cervical lymphadenopathy. Unremarkable thyroid gland. UPPER CHEST: Small left pleural effusion AORTIC ARCH: There is calcific atherosclerosis of the aortic arch. There is no aneurysm, dissection or hemodynamically significant stenosis of the visualized portion of the aorta. Conventional 3 vessel aortic branching pattern. The visualized proximal subclavian arteries are widely patent. RIGHT CAROTID SYSTEM: No dissection, occlusion or aneurysm. Mild atherosclerotic calcification at the carotid bifurcation without hemodynamically significant stenosis. LEFT CAROTID  SYSTEM: No dissection, occlusion or aneurysm. Mild atherosclerotic calcification at the carotid bifurcation without hemodynamically significant stenosis. VERTEBRAL ARTERIES: Right vertebral artery V1 and proximal V2 segments are occluded. There is return of opacification of the distal right V2 segment. There is no dissection, occlusion or flow-limiting stenosis to the skull base (V1-V3 segments). CTA HEAD FINDINGS POSTERIOR CIRCULATION: --Vertebral arteries: Normal V4 segments. --Inferior cerebellar arteries: Normal. --Basilar artery: The distal basilar artery is occluded. --Superior cerebellar arteries: Normal. --Posterior cerebral arteries (PCA): Left PCA is occluded at the proximal P2 segment. Right PCA is patent. ANTERIOR CIRCULATION: --Intracranial internal carotid arteries: Normal. --Anterior cerebral arteries (ACA): Normal. Both A1 segments are present. Patent anterior communicating artery (a-comm). --Middle cerebral arteries (MCA): Normal. VENOUS SINUSES: As permitted by contrast timing, patent. ANATOMIC VARIANTS: Fetal origin of the right posterior cerebral artery. Review of the MIP images confirms the above findings. IMPRESSION: 1. Multifocal posterior circulation occlusion, including the distal basilar artery, left PCA P2 segment and right vertebral artery V1 and proximal V2 segments. These findings are age indeterminate. 2. Small left pleural effusion. Aortic Atherosclerosis (ICD10-I70.0). Electronically Signed   By: Ulyses Jarred M.D.   On: 01/31/2022 02:11   CT HEAD WO CONTRAST  Result Date: 01/30/2022 CLINICAL DATA:  Weakness, dizziness, vertigo for 4 days EXAM: CT HEAD WITHOUT CONTRAST TECHNIQUE: Contiguous axial images were obtained from the base of the skull through the vertex without intravenous contrast. RADIATION DOSE REDUCTION: This exam was performed according to the departmental dose-optimization program which includes automated exposure control, adjustment of the mA and/or kV according  to patient size and/or use of iterative reconstruction technique. COMPARISON:  09/04/2021 FINDINGS: Brain: Stable hypodensities within the periventricular white matter consistent with chronic small vessel ischemic changes. Diffuse cerebral atrophy and ex vacuo dilatation of the lateral ventricles unchanged. No evidence of acute infarct or hemorrhage. Remaining midline structures are unremarkable. No acute extra-axial fluid collections. No mass effect. Vascular: Stable atherosclerosis.  No hyperdense vessel. Skull: Normal. Negative for fracture or focal lesion. Sinuses/Orbits: No acute finding. Other: None. IMPRESSION: 1. Stable chronic small-vessel ischemic changes and cerebral atrophy. 2. No acute intracranial process. Electronically Signed   By: Randa Ngo M.D.   On: 01/30/2022 17:29   MR Brain Wo Contrast (neuro protocol)  Result Date: 01/30/2022 CLINICAL DATA:  Weakness, dizziness and vertigo EXAM: MRI HEAD WITHOUT CONTRAST TECHNIQUE: Multiplanar, multiecho pulse sequences of the brain and surrounding structures were obtained without intravenous contrast. COMPARISON:  05/27/2016 FINDINGS: Brain: Punctate focus of abnormal diffusion restriction within the left medulla oblongata. No other diffusion abnormality. No acute or chronic hemorrhage. Hyperintense T2-weighted signal is widespread throughout the white matter. Diffuse, severe atrophy. The midline structures are normal. Vascular: Major flow voids are preserved. Skull and upper cervical spine: Normal calvarium and skull base. Visualized upper cervical spine and soft tissues are normal. Sinuses/Orbits:No paranasal sinus fluid levels or advanced mucosal thickening. No mastoid or middle ear effusion. Normal orbits. IMPRESSION: 1. Small acute infarct of the left  medulla oblongata. No hemorrhage or mass effect. 2. Severe atrophy and chronic ischemic microangiopathy. Electronically Signed   By: Ulyses Jarred M.D.   On: 01/30/2022 21:28   DG Chest Port 1  View  Result Date: 01/30/2022 CLINICAL DATA:  Altered mental status question stroke, leg weakness, increased confusion and hallucinations today EXAM: PORTABLE CHEST 1 VIEW COMPARISON:  Portable exam 1703 hours compared to 09/04/2021 FINDINGS: Normal heart size, mediastinal contours, and pulmonary vascularity. Atherosclerotic calcification aorta. Persistent LEFT pleural effusion and basilar atelectasis. Remaining lungs clear. No new areas of consolidation or pneumothorax. Bones demineralized. IMPRESSION: Persistent LEFT pleural effusion and basilar atelectasis. Aortic Atherosclerosis (ICD10-I70.0). Electronically Signed   By: Lavonia Dana M.D.   On: 01/30/2022 17:31   ECHOCARDIOGRAM COMPLETE  Result Date: 01/31/2022    ECHOCARDIOGRAM REPORT   Patient Name:   ANTAWN SISON Date of Exam: 01/31/2022 Medical Rec #:  [id number]       Height:       67.0 in Accession #:    [id number]      Weight:       143.0 lb Date of Birth:  [birth year]       BSA:          1.754 m Patient Age:    15 years        BP:           147/73 mmHg Patient Gender: M               HR:           61 bpm. Exam Location:  Inpatient Procedure: 2D Echo, Cardiac Doppler and Color Doppler Indications:    Stroke  History:        Patient has no prior history of Echocardiogram examinations.                 Risk Factors:Hypertension.  Sonographer:    Jyl Heinz Referring Phys: [place]  1. Left ventricular ejection fraction, by estimation, is 60 to 65%. The left ventricle has normal function. The left ventricle has no regional wall motion abnormalities. There is mild left ventricular hypertrophy. Left ventricular diastolic parameters are consistent with Grade I diastolic dysfunction (impaired relaxation).  2. Peak RV-RA gradient 34 mmHg. The IVC was not well-visualized. Right ventricular systolic function is normal. The right ventricular size is normal.  3. The mitral valve is normal in structure. No evidence of mitral valve  regurgitation. No evidence of mitral stenosis.  4. The aortic valve is tricuspid. Aortic valve regurgitation is not visualized. Aortic valve sclerosis/calcification is present, without any evidence of aortic stenosis. FINDINGS  Left Ventricle: Left ventricular ejection fraction, by estimation, is 60 to 65%. The left ventricle has normal function. The left ventricle has no regional wall motion abnormalities. The left ventricular internal cavity size was normal in size. There is  mild left ventricular hypertrophy. Left ventricular diastolic parameters are consistent with Grade I diastolic dysfunction (impaired relaxation). Right Ventricle: Peak RV-RA gradient 34 mmHg. The IVC was not well-visualized. The right ventricular size is normal. No increase in right ventricular wall thickness. Right ventricular systolic function is normal. Left Atrium: Left atrial size was normal in size. Right Atrium: Right atrial size was normal in size. Pericardium: There is no evidence of pericardial effusion. Mitral Valve: The mitral valve is normal in structure. Mild mitral annular calcification. No evidence of mitral valve regurgitation. No evidence of mitral valve stenosis. Tricuspid Valve: The tricuspid valve is normal  in structure. Tricuspid valve regurgitation is trivial. Aortic Valve: The aortic valve is tricuspid. Aortic valve regurgitation is not visualized. Aortic valve sclerosis/calcification is present, without any evidence of aortic stenosis. Aortic valve peak gradient measures 5.4 mmHg. Pulmonic Valve: The pulmonic valve was normal in structure. Pulmonic valve regurgitation is trivial. Aorta: The aortic root is normal in size and structure. Venous: The inferior vena cava was not well visualized. IAS/Shunts: No atrial level shunt detected by color flow Doppler.  LEFT VENTRICLE PLAX 2D LVIDd:         3.90 cm     Diastology LVIDs:         3.00 cm     LV e' medial:    6.42 cm/s LV PW:         1.30 cm     LV E/e' medial:  12.3  LV IVS:        1.30 cm     LV e' lateral:   5.66 cm/s LVOT diam:     2.00 cm     LV E/e' lateral: 14.0 LV SV:         72 LV SV Index:   41 LVOT Area:     3.14 cm  LV Volumes (MOD) LV vol d, MOD A2C: 69.8 ml LV vol d, MOD A4C: 63.2 ml LV vol s, MOD A2C: 27.2 ml LV vol s, MOD A4C: 22.5 ml LV SV MOD A2C:     42.6 ml LV SV MOD A4C:     63.2 ml LV SV MOD BP:      42.9 ml RIGHT VENTRICLE RV Basal diam:  3.10 cm RV Mid diam:    2.80 cm RV S prime:     12.40 cm/s TAPSE (M-mode): 2.4 cm LEFT ATRIUM             Index        RIGHT ATRIUM           Index LA diam:        3.40 cm 1.94 cm/m   RA Area:     11.30 cm LA Vol (A2C):   23.5 ml 13.40 ml/m  RA Volume:   24.10 ml  13.74 ml/m LA Vol (A4C):   28.3 ml 16.14 ml/m LA Biplane Vol: 26.1 ml 14.88 ml/m  AORTIC VALVE AV Area (Vmax): 2.84 cm AV Vmax:        116.00 cm/s AV Peak Grad:   5.4 mmHg LVOT Vmax:      105.00 cm/s LVOT Vmean:     70.400 cm/s LVOT VTI:       0.230 m  AORTA Ao Root diam: 3.30 cm Ao Asc diam:  3.20 cm MITRAL VALVE               TRICUSPID VALVE MV Area (PHT): 3.10 cm    TR Peak grad:   34.3 mmHg MV Decel Time: 245 msec    TR Vmax:        293.00 cm/s MV E velocity: 79.20 cm/s MV A velocity: 87.90 cm/s  SHUNTS MV E/A ratio:  0.90        Systemic VTI:  0.23 m                            Systemic Diam: 2.00 cm Dalton McleanMD Electronically signed by Franki Monte Signature Date/Time: 01/31/2022/2:23:51 PM    Final      PHYSICAL EXAM  Temp:  [97.6 F (  36.4 C)-98.4 F (36.9 C)] 98.4 F (36.9 C) (02/17 1946) Pulse Rate:  [53-66] 64 (02/17 1946) Resp:  [10-20] 16 (02/17 1946) BP: (102-171)/(46-73) 171/68 (02/17 1946) SpO2:  [95 %-100 %] 99 % (02/17 1946)  General - Well nourished, well developed, in no apparent distress.  Ophthalmologic - fundi not visualized due to noncooperation.  Cardiovascular - Regular rhythm and rate.  Neuro - awake, alert, eyes open, not orientated to age, place, time and people. No aphasia, hard of hearing, paucity of  speech, following all simple commands. Able to name and repeat. No gaze palsy, tracking bilaterally, visual field full, no nystagmus, PERRL. No facial droop. Tongue midline. Bilateral UEs 5/5, no drift. Bilaterally LEs 5/5, no drift. Sensation symmetrical bilaterally, b/l FTN grossly intact, gait not tested.    ASSESSMENT/PLAN Paul [name] is a 88 y.o. male with history of hypertension, hyperlipidemia, gastric ulcer, dementia admitted for dizziness, vertigo, left foot weakness and hallucination.   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 CT head and neck showed diminutive posterior circulation with distal basilar artery occlusion, left P2, right V1 and V2 stenosis, left VA V4 occlusion. MRI left lateral medullary small infarct 2D Echo EF 60 to 65% LDL 68 HgbA1c 5.3 Lovenox for VTE prophylaxis clopidogrel 75 mg daily prior to admission, now on aspirin 81 mg daily and clopidogrel 75 mg daily for 3 months and then Plavix alone given left VA V4 occlusion Ongoing aggressive stroke risk factor management Therapy recommendations: Home health PT Disposition: Pending  Dysphagia Speech on board On diet Close monitoring On IV fluid  Hypertension Stable Avoid low BP Long term BP goal 1 30-1 50 given posterior circulation severe stenosis  Hyperlipidemia Home meds: Zetia 10 LDL 68, goal < 70 Now on Zetia 10 Continue Zetia at discharge  Other Stroke Risk Factors Advanced age  Other Active Problems Dementia Gastric ulcer BPH  Hospital day # 0  Neurology will sign off. Please call with questions. Pt will follow up with stroke clinic NP at [facility] in about 4 weeks. Thanks for the consult.   Rosalin Hawking, MD PhD Stroke Neurology 01/31/2022 8:38 PM    To contact Stroke Continuity provider, please refer to [email or web address] After hours, contact General Neurology

## 2022-01-31 NOTE — Evaluation (Signed)
Occupational Therapy Evaluation Patient Details Name: Paul [name] MRN: [id number] DOB: July 27, 1930 Today's Date: 01/31/2022   History of Present Illness Pt is an 88 y.o. male who presented to the ED from home with c/o dizziness and weakness. MRI revealed small acute infarct of the left medulla oblongata. PMH:  dementia, hypertension, bladder cancer in remission, gastric ulcer   Clinical Impression   Pt admitted for concerns listed above. PTA pt and family reported that he was requiring supervision for mobility and supervision to min A for ADL's and IADL's. At this time, pt presents with increased weakness, balance deficits, decreased coordination with BLE, and decreased safety awareness. Pt posteriorly leaning and requiring multimodal cuing to lean forward on to his toes. Family provided education on safe transfers, use of gait belt, and completing stairs safely. Recommending HHOT to maximize pt safety and reduce burden of care. OT will follow acutely.       Recommendations for follow up therapy are one component of a multi-disciplinary discharge planning process, led by the attending physician.  Recommendations may be updated based on patient status, additional functional criteria and insurance authorization.   Follow Up Recommendations  Home health OT    Assistance Recommended at Discharge Frequent or constant Supervision/Assistance  Patient can return home with the following A lot of help with walking and/or transfers;A lot of help with bathing/dressing/bathroom;Assistance with cooking/housework;Direct supervision/assist for medications management;Direct supervision/assist for financial management;Assist for transportation;Help with stairs or ramp for entrance    Functional Status Assessment  Patient has had a recent decline in their functional status and demonstrates the ability to make significant improvements in function in a reasonable and predictable amount of time.  Equipment  Recommendations  None recommended by OT    Recommendations for Other Services       Precautions / Restrictions Precautions Precautions: Fall Restrictions Weight Bearing Restrictions: No      Mobility Bed Mobility Overal bed mobility: Needs Assistance Bed Mobility: Supine to Sit, Sit to Supine     Supine to sit: Min assist, HOB elevated Sit to supine: Min guard   General bed mobility comments: increased time, +rail, cues for sequencing, assist to elevate trunk    Transfers Overall transfer level: Needs assistance Equipment used: Rolling walker (2 wheels) Transfers: Sit to/from Stand, Bed to chair/wheelchair/BSC Sit to Stand: Mod assist     Step pivot transfers: Mod assist     General transfer comment: assist to power up and stabilize balance, needing mod A to take steps and remain balanced, performed 3x sit<>stand each attempt, pt requiring less assist to power up.      Balance Overall balance assessment: Needs assistance Sitting-balance support: Single extremity supported, Feet supported Sitting balance-Leahy Scale: Fair Sitting balance - Comments: Min guard EOB due to posterior lean Postural control: Posterior lean Standing balance support: During functional activity, Bilateral upper extremity supported, Reliant on assistive device for balance Standing balance-Leahy Scale: Poor Standing balance comment: Pt leaning backwards and lifting toes when initially standing                           ADL either performed or assessed with clinical judgement   ADL Overall ADL's : Needs assistance/impaired Eating/Feeding: Set up   Grooming: Minimal assistance;Sitting   Upper Body Bathing: Minimal assistance;Sitting   Lower Body Bathing: Maximal assistance;Sitting/lateral leans;Sit to/from stand   Upper Body Dressing : Minimal assistance;Sitting   Lower Body Dressing: Maximal assistance;Sitting/lateral leans;Sit to/from stand  Toilet Transfer: Moderate  assistance;Stand-pivot   Toileting- Clothing Manipulation and Hygiene: Maximal assistance;Sitting/lateral lean;Sit to/from stand       Functional mobility during ADLs: Moderate assistance;Rolling walker (2 wheels) General ADL Comments: Pt very weak, with posterior lean this session, requiring frequent verbal cuing for safety and attention.     Vision Baseline Vision/History: 1 Wears glasses Ability to See in Adequate Light: 0 Adequate Patient Visual Report: Diplopia Vision Assessment?: Vision impaired- to be further tested in functional context Additional Comments: Pt reporting intermittent diplopia, not present this session, seen reading menu at start of session to pick out lunch. tracking, peripherals, and saccades all [facility]     Perception     Praxis      Pertinent Vitals/Pain Pain Assessment Pain Assessment: No/denies pain     Hand Dominance Right   Extremity/Trunk Assessment Upper Extremity Assessment Upper Extremity Assessment: Overall WFL for tasks assessed   Lower Extremity Assessment Lower Extremity Assessment: Defer to PT evaluation   Cervical / Trunk Assessment Cervical / Trunk Assessment: Normal   Communication Communication Communication: HOH   Cognition Arousal/Alertness: Awake/alert Behavior During Therapy: WFL for tasks assessed/performed Overall Cognitive Status: History of cognitive impairments - at baseline                                 General Comments: Dementia at baseline, some hallucinations noted this hospital stay     General Comments  VSS on Ra, daughter present and supportive    Exercises     Shoulder Instructions      Home Living Family/patient expects to be discharged to:: Private residence Living Arrangements: Spouse/significant other;Children Available Help at Discharge: Family;Available 24 hours/day Type of Home: House Home Access: Stairs to enter [employer] of Steps: 3 Entrance Stairs-Rails:  Right;Left Home Layout: One level     Bathroom Shower/Tub: Occupational psychologist: Standard Bathroom Accessibility: Yes How Accessible: Accessible via walker Home Equipment: [place] (2 wheels);Shower seat;Grab bars - tub/shower;BSC/3in1      Lives With: Spouse    Prior Functioning/Environment Prior Level of Function : Needs assist             Mobility Comments: Ambulating very short distances in home with RW. Physical assist required from family for OOB, transfers and walking. ADLs Comments: Family assists with all ADLs, as needed. Pt frequently incontinent of bowel.        OT Problem List: Decreased strength;Decreased range of motion;Decreased activity tolerance;Impaired balance (sitting and/or standing);Decreased cognition;Decreased safety awareness;Decreased knowledge of use of DME or AE      OT Treatment/Interventions: Self-care/ADL training;Therapeutic exercise;Energy conservation;DME and/or AE instruction;Therapeutic activities;Cognitive remediation/compensation;Balance training;Patient/family education    OT Goals(Current goals can be found in the care plan section) Acute Rehab OT Goals Patient Stated Goal: To go home OT Goal Formulation: With patient/family Time For Goal Achievement: 02/14/22 Potential to Achieve Goals: Good ADL Goals Pt Will Perform Grooming: with modified independence;sitting Pt Will Perform Lower Body Bathing: with min assist;sitting/lateral leans;sit to/from stand Pt Will Perform Lower Body Dressing: with min assist;sitting/lateral leans;sit to/from stand Pt Will Transfer to Toilet: with min assist;ambulating Pt Will Perform Toileting - Clothing Manipulation and hygiene: with min guard assist;sitting/lateral leans;sit to/from stand  OT Frequency: Min 2X/week    Co-evaluation              AM-PAC OT "6 Clicks" Daily Activity     Outcome Measure Help from  another person eating meals?: A Little Help from another person  taking care of personal grooming?: A Little Help from another person toileting, which includes using toliet, bedpan, or urinal?: A Lot Help from another person bathing (including washing, rinsing, drying)?: A Lot Help from another person to put on and taking off regular upper body clothing?: A Little Help from another person to put on and taking off regular lower body clothing?: A Lot 6 Click Score: 15   End of Session Equipment Utilized During Treatment: Gait belt;Rolling walker (2 wheels) Nurse Communication: Mobility status  Activity Tolerance: Patient tolerated treatment well Patient left: in bed;with call bell/phone within reach;with bed alarm set;with family/visitor present  OT Visit Diagnosis: Unsteadiness on feet (R26.81);Other abnormalities of gait and mobility (R26.89);Muscle weakness (generalized) (M62.81)                Time: 0932-3557 OT Time Calculation (min): 51 min Charges:  OT General Charges $OT Visit: 1 Visit OT Evaluation $OT Eval Moderate Complexity: 1 Mod OT Treatments $Self Care/Home Management : 8-22 mins $Therapeutic Activity: 8-22 mins  Shayleen Eppinger H., OTR/L Acute Rehabilitation  Likisha Alles Elane Yolanda Bonine 01/31/2022, 4:38 PM

## 2022-01-31 NOTE — Evaluation (Signed)
Physical Therapy Evaluation Patient Details Name: Paul [name] MRN: [id number] DOB: Aug 29, 1930 Today's Date: 01/31/2022  History of Present Illness  Pt is an 88 y.o. male who presented to the ED from home with c/o dizziness and weakness. MRI revealed small acute infarct of the left medulla oblongata. PMH:  dementia, hypertension, bladder cancer in remission, gastric ulcer   Clinical Impression  Pt admitted with above diagnosis. PTA pt lived at home with family, ambulating short household distances with RW. Daughter reports multiple falls and steady decline in mobility and cognition. Pt currently with functional limitations due to the deficits listed below (see PT Problem List). On eval, pt required min assist bed mobility, mod assist transfers, and min assist ambulation 5' with RW. Mildly ataxic gait with deficits noted in motor planning. Pt will benefit from skilled PT to increase their independence and safety with mobility to allow discharge to the venue listed below.  Discussed AIR/SNF with daughter. Family prefers home with home health due to previous history of hospital delirium and sundowning.         Recommendations for follow up therapy are one component of a multi-disciplinary discharge planning process, led by the attending physician.  Recommendations may be updated based on patient status, additional functional criteria and insurance authorization.  Follow Up Recommendations Home health PT    Assistance Recommended at Discharge Frequent or constant Supervision/Assistance  Patient can return home with the following  A lot of help with bathing/dressing/bathroom;Assistance with cooking/housework;Direct supervision/assist for medications management;Assist for transportation;Help with stairs or ramp for entrance;A little help with walking and/or transfers    Equipment Recommendations Wheelchair (measurements PT)  Recommendations for Other Services       Functional Status Assessment  Patient has had a recent decline in their functional status and demonstrates the ability to make significant improvements in function in a reasonable and predictable amount of time.     Precautions / Restrictions Precautions Precautions: Fall      Mobility  Bed Mobility Overal bed mobility: Needs Assistance Bed Mobility: Supine to Sit     Supine to sit: Min assist, HOB elevated     General bed mobility comments: increased time, +rail, cues for sequencing, assist to elevate trunk    Transfers Overall transfer level: Needs assistance Equipment used: Rolling walker (2 wheels) Transfers: Sit to/from Stand, Bed to chair/wheelchair/BSC Sit to Stand: Mod assist   Step pivot transfers: Mod assist       General transfer comment: assist to power up and stabilize balance    Ambulation/Gait Ambulation/Gait assistance: Min assist, +2 safety/equipment Gait Distance (Feet): 5 Feet Assistive device: Rolling walker (2 wheels) Gait Pattern/deviations: Step-through pattern, Decreased stride length, Ataxic Gait velocity: decreased Gait velocity interpretation: <1.8 ft/sec, indicate of risk for recurrent falls   General Gait Details: unsteady gait. Pt declining further gait distance, stating "I can't do it. My legs won't hold up."  Stairs            Wheelchair Mobility    Modified Rankin (Stroke Patients Only) Modified Rankin (Stroke Patients Only) Pre-Morbid Rankin Score: Moderately severe disability Modified Rankin: Moderately severe disability     Balance Overall balance assessment: Needs assistance Sitting-balance support: Feet supported, Bilateral upper extremity supported Sitting balance-Leahy Scale: Poor Sitting balance - Comments: min to min guard to maintain balance EOB Postural control: Posterior lean Standing balance support: During functional activity, Bilateral upper extremity supported, Reliant on assistive device for balance Standing balance-Leahy Scale:  Poor  Pertinent Vitals/Pain Pain Assessment Pain Assessment: No/denies pain    Home Living Family/patient expects to be discharged to:: Private residence Living Arrangements: Spouse/significant other;Children (son) Available Help at Discharge: Family;Available 24 hours/day Type of Home: House Home Access: Stairs to enter Entrance Stairs-Rails: Psychiatric nurse of Steps: 3   Home Layout: One level Home Equipment: Conservation officer, nature (2 wheels);Shower seat;Grab bars - tub/shower;BSC/3in1      Prior Function Prior Level of Function : Needs assist  Cognitive Assist : Mobility (cognitive);ADLs (cognitive) Mobility (Cognitive): Intermittent cues ADLs (Cognitive): Intermittent cues Physical Assist : Mobility (physical);ADLs (physical) Mobility (physical): Bed mobility;Transfers;Gait;Stairs ADLs (physical): Grooming;Bathing;Dressing;Toileting Mobility Comments: Ambulating very short distances in home with RW. Physical assist required from family for OOB, transfers and walking. ADLs Comments: Family assists with all ADLs, as needed. Pt frequently incontinent of bowel.     Hand Dominance        Extremity/Trunk Assessment   Upper Extremity Assessment Upper Extremity Assessment: Defer to OT evaluation    Lower Extremity Assessment Lower Extremity Assessment: RLE deficits/detail;LLE deficits/detail RLE Deficits / Details: 4/5 grossly graded RLE Sensation: decreased proprioception RLE Coordination: decreased gross motor;decreased fine motor LLE Deficits / Details: 4/5 grossly graded LLE Sensation: decreased proprioception LLE Coordination: decreased gross motor;decreased fine motor    Cervical / Trunk Assessment Cervical / Trunk Assessment: Normal  Communication   Communication: HOH  Cognition Arousal/Alertness: Awake/alert Behavior During Therapy: WFL for tasks assessed/performed Overall Cognitive Status: History of  cognitive impairments - at baseline                                 General Comments: Dementia at baseline. Hallucinations. Pt adamant that there was a man standing beside his bed, when no one was there. Daughter present and relays pt has had steady decline in cognitive status. Pt with sundowning and h/o delerium with previous hospitalizations.        General Comments General comments (skin integrity, edema, etc.): Pt reporting double vision.    Exercises     Assessment/Plan    PT Assessment Patient needs continued PT services  PT Problem List Decreased activity tolerance;Decreased balance;Decreased cognition;Decreased coordination;Decreased mobility;Decreased safety awareness;Decreased knowledge of precautions       PT Treatment Interventions Functional mobility training;Neuromuscular re-education;Balance training;Stair training;Gait training;Therapeutic exercise;Patient/family education;DME instruction;Therapeutic activities    PT Goals (Current goals can be found in the Care Plan section)  Acute Rehab PT Goals Patient Stated Goal: home PT Goal Formulation: With patient/family Time For Goal Achievement: 02/14/22 Potential to Achieve Goals: Good    Frequency Min 4X/week     Co-evaluation               AM-PAC PT "6 Clicks" Mobility  Outcome Measure Help needed turning from your back to your side while in a flat bed without using bedrails?: A Little Help needed moving from lying on your back to sitting on the side of a flat bed without using bedrails?: A Little Help needed moving to and from a bed to a chair (including a wheelchair)?: A Lot Help needed standing up from a chair using your arms (e.g., wheelchair or bedside chair)?: A Lot Help needed to walk in hospital room?: A Little Help needed climbing 3-5 steps with a railing? : A Lot 6 Click Score: 15    End of Session Equipment Utilized During Treatment: Gait belt Activity Tolerance: Patient  tolerated treatment well Patient left: in chair;with call  bell/phone within reach;with chair alarm set;with family/visitor present Nurse Communication: Mobility status PT Visit Diagnosis: History of falling (Z91.81);Other abnormalities of gait and mobility (R26.89)    Time: 3159-4585 PT Time Calculation (min) (ACUTE ONLY): 47 min   Charges:   PT Evaluation $PT Eval Moderate Complexity: 1 Mod PT Treatments $Gait Training: 8-22 mins $Therapeutic Activity: 8-22 mins        Lorrin Goodell, PT  Office # [phone] Pager [phone]   Lorriane Shire 01/31/2022, 11:50 AM

## 2022-01-31 NOTE — Progress Notes (Signed)
Patient suffers from stroke which impairs their ability to perform daily activities like bathing, grooming, and toileting in the home.  A walker will not resolve  issue with performing activities of daily living. A wheelchair will allow patient to safely perform daily activities. Patient is not able to propel themselves in the home using a standard weight wheelchair due to general weakness. Patient can self propel in the lightweight wheelchair. Length of need 6 months .  Accessories: elevating leg rests (ELRs), wheel locks, extensions and anti-tippers.

## 2022-01-31 NOTE — H&P (Signed)
History and Physical    RICCI DIROCCO VEH:[id number] DOB: [birth year] DOA: 01/30/2022  PCP: Crist Infante, MD  Patient coming from: Home.  Chief Complaint: Dizziness.  HPI: Paul [name] is a 88 y.o. male with history of dementia, hypertension, bladder cancer in remission, gastric ulcer was brought to the ER after patient's family noticed that patient has been dragging his legs since yesterday afternoon.  Over the last 4 days patient has been feeling dizzy but over the last 24 hours dizziness was severe that patient was unable to get out of the bed.  Patient usually ambulates with the help of a walker.  Yesterday afternoon around 2:00 when patient tried to get out of bed he was dragging his left foot and at this point patient was brought to the ER.  Per family patient lisinopril was recently discontinued due to low normal blood pressure.  In addition patient's family also noticed left eye droop and possible left facial droop.  ED Course: In the ER MRI of the brain does show stroke involving the left medulla.  On-call neurologist was consulted.  CT angiogram of the head shows multifocal multiple stenosis in the posterior circulation with occlusion as per neurology they suspect this to be chronic.  Patient admitted for further stroke work-up.  Patient did pass stroke swallow.  COVID test was negative.  Chest x-ray shows persistent left-sided pleural effusion.  Hemoglobin is around 10.9.  EKG shows sinus bradycardia with a heart rate of 55 bpm.  Review of Systems: As per HPI, rest all negative.   Past Medical History:  Diagnosis Date   Back pain    Bladder tumor    BPH (benign prostatic hyperplasia)    Hyperlipidemia    Hypertension    Impaired hearing BILATERAL HEARING AIDS   Personal history of gastric ulcer     Past Surgical History:  Procedure Laterality Date   APPENDECTOMY  1957   CATARACT EXTRACTION W/ INTRAOCULAR LENS  IMPLANT, BILATERAL     CYSTOSCOPY WITH BIOPSY  03/08/2012    Procedure: CYSTOSCOPY WITH BIOPSY;  Surgeon: Claybon Jabs, MD;  Location: [facility];  Service: Urology;  Laterality: N/A;  gyrus   TONSILLECTOMY  CHILD   TRANSURETHRAL INCISION OF PROSTATE  03/08/2012   Procedure: TRANSURETHRAL INCISION OF THE PROSTATE (TUIP);  Surgeon: Claybon Jabs, MD;  Location: [facility];  Service: Urology;  Laterality: N/A;   TRANSURETHRAL RESECTION OF BLADDER TUMOR  01/26/2012   Procedure: TRANSURETHRAL RESECTION OF BLADDER TUMOR (TURBT);  Surgeon: Claybon Jabs, MD;  Location: [facility];  Service: Urology;  Laterality: N/A;  GYRUS MYTOMICIN C      reports that he quit smoking about 41 years ago. His smoking use included cigarettes. He has never used smokeless tobacco. He reports that he does not drink alcohol and does not use drugs.  Allergies  Allergen Reactions   Livalo [Pitavastatin] Other (See Comments)    Cramping and back pain   Livalo [Pitavastatin] Other (See Comments)    Cramping and back pain   Other Diarrhea and Other (See Comments)    Unnamed patch and tablets for dementia caused diarrhea (Possibly galantamine, from outside source)     Family History  Problem Relation Age of Onset   Kidney cancer Mother    Hypertension Father     Prior to Admission medications   Medication Sig Start Date End Date Taking? Authorizing Provider  acetaminophen (TYLENOL) 500 MG tablet Take 500 mg  by mouth daily as needed for mild pain or headache.   Yes [provider]  clopidogrel (PLAVIX) 75 MG tablet Take 75 mg by mouth daily. 01/27/20  Yes [provider]  donepezil (ARICEPT) 5 MG tablet Take 5 mg by mouth daily.   Yes [provider]  escitalopram (LEXAPRO) 20 MG tablet Take 20 mg by mouth daily. 01/24/20  Yes [provider]  ezetimibe (ZETIA) 10 MG tablet Take 10 mg by mouth daily.   Yes [provider]  isosorbide mononitrate (IMDUR) 60 MG 24 hr tablet Take 60 mg  by mouth every morning. 12/28/20  Yes [provider]  simvastatin (ZOCOR) 20 MG tablet Take 20 mg by mouth every evening.  02/27/12  [provider]    Physical Exam: Constitutional: Moderately built and nourished. Vitals:   01/31/22 0100 01/31/22 0115 01/31/22 0130 01/31/22 0213  BP: (!) 102/54  (!) 117/59 (!) 146/59  Pulse: (!) 58 (!) 58 (!) 56 (!) 58  Resp: 18 19 16 16   Temp:      TempSrc:      SpO2: 97% 97% 98% 95%  Weight:      Height:       Eyes: Anicteric no pallor. ENMT: No discharge from the ears eyes nose and mouth. Neck: No mass felt.  No neck rigidity. Respiratory: No rhonchi or crepitations. Cardiovascular: S1-S2 heard. Abdomen: Soft nontender bowel sound present. Musculoskeletal: No edema. Skin: No rash. Neurologic: Alert awake oriented to name and place moving all extremities 5 x 5.  No facial asymmetry tongue is midline pupils equal reactive to light. Psychiatric: Has history of dementia.   Labs on Admission: I have personally reviewed following labs and imaging studies  CBC: Recent Labs  Lab 01/30/22 1701 01/30/22 1716  WBC 6.9  --   NEUTROABS 5.4  --   HGB 10.9* 10.9*  HCT 35.1* 32.0*  MCV 91.2  --   PLT 334  --    Basic Metabolic Panel: Recent Labs  Lab 01/30/22 1701 01/30/22 1716  NA 136 140  K 4.3 4.0  CL 103 103  CO2 25  --   GLUCOSE 107* 96  BUN 17 18  CREATININE 1.04 1.00  CALCIUM 8.6*  --    GFR: Estimated Creatinine Clearance: 46 mL/min (by C-G formula based on SCr of 1 mg/dL). Liver Function Tests: Recent Labs  Lab 01/30/22 1701  AST 19  ALT 9  ALKPHOS 67  BILITOT 0.4  PROT 5.7*  ALBUMIN 3.1*   No results for input(s): LIPASE, AMYLASE in the last 168 hours. No results for input(s): AMMONIA in the last 168 hours. Coagulation Profile: Recent Labs  Lab 01/30/22 1701  INR 1.1   Cardiac Enzymes: No results for input(s): CKTOTAL, CKMB, CKMBINDEX, TROPONINI in the last 168 hours. BNP (last 3  results) No results for input(s): PROBNP in the last 8760 hours. HbA1C: No results for input(s): HGBA1C in the last 72 hours. CBG: No results for input(s): GLUCAP in the last 168 hours. Lipid Profile: No results for input(s): CHOL, HDL, LDLCALC, TRIG, CHOLHDL, LDLDIRECT in the last 72 hours. Thyroid Function Tests: No results for input(s): TSH, T4TOTAL, FREET4, T3FREE, THYROIDAB in the last 72 hours. Anemia Panel: No results for input(s): VITAMINB12, FOLATE, FERRITIN, TIBC, IRON, RETICCTPCT in the last 72 hours. Urine analysis:    Component Value Date/Time   COLORURINE YELLOW 01/30/2022 1701   APPEARANCEUR CLEAR 01/30/2022 1701   LABSPEC 1.026 01/30/2022 1701   PHURINE 5.0 01/30/2022  [place] 01/30/2022 1701   HGBUR NEGATIVE 01/30/2022 1701   BILIRUBINUR NEGATIVE 01/30/2022 1701   KETONESUR NEGATIVE 01/30/2022 1701   PROTEINUR NEGATIVE 01/30/2022 1701   NITRITE NEGATIVE 01/30/2022 1701   LEUKOCYTESUR NEGATIVE 01/30/2022 1701   Sepsis Labs: @LABRCNTIP (procalcitonin:4,lacticidven:4) ) Recent Results (from the past 240 hour(s))  Resp Panel by RT-PCR (Flu A&B, Covid) Nasopharyngeal Swab     Status: None   Collection Time: 01/30/22  4:25 PM   Specimen: Nasopharyngeal Swab; Nasopharyngeal(NP) swabs in vial transport medium  Result Value Ref Range Status   SARS Coronavirus 2 by RT PCR NEGATIVE NEGATIVE Final    Comment: (NOTE) SARS-CoV-2 target nucleic acids are NOT DETECTED.  The SARS-CoV-2 RNA is generally detectable in upper respiratory specimens during the acute phase of infection. The lowest concentration of SARS-CoV-2 viral copies this assay can detect is 138 copies/mL. A negative result does not preclude SARS-Cov-2 infection and should not be used as the sole basis for treatment or other patient management decisions. A negative result may occur with  improper specimen collection/handling, submission of specimen other than nasopharyngeal swab, presence of  viral mutation(s) within the areas targeted by this assay, and inadequate number of viral copies(<138 copies/mL). A negative result must be combined with clinical observations, patient history, and epidemiological information. The expected result is Negative.  Fact Sheet for Patients:  EntrepreneurPulse.com.au  Fact Sheet for Healthcare Providers:  IncredibleEmployment.be  This test is no t yet approved or cleared by the [place] FDA and  has been authorized for detection and/or diagnosis of SARS-CoV-2 by FDA under an Emergency Use Authorization (EUA). This EUA will remain  in effect (meaning this test can be used) for the duration of the COVID-19 declaration under Section 564(b)(1) of the Act, 21 U.S.C.section 360bbb-3(b)(1), unless the authorization is terminated  or revoked sooner.       Influenza A by PCR NEGATIVE NEGATIVE Final   Influenza B by PCR NEGATIVE NEGATIVE Final    Comment: (NOTE) The Xpert Xpress SARS-CoV-2/FLU/RSV plus assay is intended as an aid in the diagnosis of influenza from Nasopharyngeal swab specimens and should not be used as a sole basis for treatment. Nasal washings and aspirates are unacceptable for Xpert Xpress SARS-CoV-2/FLU/RSV testing.  Fact Sheet for Patients: EntrepreneurPulse.com.au  Fact Sheet for Healthcare Providers: IncredibleEmployment.be  This test is not yet approved or cleared by the [place] FDA and has been authorized for detection and/or diagnosis of SARS-CoV-2 by FDA under an Emergency Use Authorization (EUA). This EUA will remain in effect (meaning this test can be used) for the duration of the COVID-19 declaration under Section 564(b)(1) of the Act, 21 U.S.C. section 360bbb-3(b)(1), unless the authorization is terminated or revoked.  Performed at [facility], [place] [address] [place], [place] [address]      Radiological Exams on  Admission: CT ANGIO HEAD NECK W WO CM  Result Date: 01/31/2022 CLINICAL DATA:  Stroke/TIA EXAM: CT ANGIOGRAPHY HEAD AND NECK TECHNIQUE: Multidetector CT imaging of the head and neck was performed using the standard protocol during bolus administration of intravenous contrast. Multiplanar CT image reconstructions and MIPs were obtained to evaluate the vascular anatomy. Carotid stenosis measurements (when applicable) are obtained utilizing NASCET criteria, using the distal internal carotid diameter as the denominator. RADIATION DOSE REDUCTION: This exam was performed according to the departmental dose-optimization program which includes automated exposure control, adjustment of the mA and/or kV according to patient size and/or use of iterative reconstruction technique. CONTRAST:  20mL OMNIPAQUE IOHEXOL 350 MG/ML SOLN COMPARISON:  None. FINDINGS: CTA NECK FINDINGS SKELETON: There is no bony spinal canal stenosis. No lytic or blastic lesion. OTHER NECK: Normal pharynx, larynx and major salivary glands. No cervical lymphadenopathy. Unremarkable thyroid gland. UPPER CHEST: Small left pleural effusion AORTIC ARCH: There is calcific atherosclerosis of the aortic arch. There is no aneurysm, dissection or hemodynamically significant stenosis of the visualized portion of the aorta. Conventional 3 vessel aortic branching pattern. The visualized proximal subclavian arteries are widely patent. RIGHT CAROTID SYSTEM: No dissection, occlusion or aneurysm. Mild atherosclerotic calcification at the carotid bifurcation without hemodynamically significant stenosis. LEFT CAROTID SYSTEM: No dissection, occlusion or aneurysm. Mild atherosclerotic calcification at the carotid bifurcation without hemodynamically significant stenosis. VERTEBRAL ARTERIES: Right vertebral artery V1 and proximal V2 segments are occluded. There is return of opacification of the distal right V2 segment. There is no dissection, occlusion or flow-limiting  stenosis to the skull base (V1-V3 segments). CTA HEAD FINDINGS POSTERIOR CIRCULATION: --Vertebral arteries: Normal V4 segments. --Inferior cerebellar arteries: Normal. --Basilar artery: The distal basilar artery is occluded. --Superior cerebellar arteries: Normal. --Posterior cerebral arteries (PCA): Left PCA is occluded at the proximal P2 segment. Right PCA is patent. ANTERIOR CIRCULATION: --Intracranial internal carotid arteries: Normal. --Anterior cerebral arteries (ACA): Normal. Both A1 segments are present. Patent anterior communicating artery (a-comm). --Middle cerebral arteries (MCA): Normal. VENOUS SINUSES: As permitted by contrast timing, patent. ANATOMIC VARIANTS: Fetal origin of the right posterior cerebral artery. Review of the MIP images confirms the above findings. IMPRESSION: 1. Multifocal posterior circulation occlusion, including the distal basilar artery, left PCA P2 segment and right vertebral artery V1 and proximal V2 segments. These findings are age indeterminate. 2. Small left pleural effusion. Aortic Atherosclerosis (ICD10-I70.0). Electronically Signed   By: Ulyses Jarred M.D.   On: 01/31/2022 02:11   CT HEAD WO CONTRAST  Result Date: 01/30/2022 CLINICAL DATA:  Weakness, dizziness, vertigo for 4 days EXAM: CT HEAD WITHOUT CONTRAST TECHNIQUE: Contiguous axial images were obtained from the base of the skull through the vertex without intravenous contrast. RADIATION DOSE REDUCTION: This exam was performed according to the departmental dose-optimization program which includes automated exposure control, adjustment of the mA and/or kV according to patient size and/or use of iterative reconstruction technique. COMPARISON:  09/04/2021 FINDINGS: Brain: Stable hypodensities within the periventricular white matter consistent with chronic small vessel ischemic changes. Diffuse cerebral atrophy and ex vacuo dilatation of the lateral ventricles unchanged. No evidence of acute infarct or hemorrhage.  Remaining midline structures are unremarkable. No acute extra-axial fluid collections. No mass effect. Vascular: Stable atherosclerosis.  No hyperdense vessel. Skull: Normal. Negative for fracture or focal lesion. Sinuses/Orbits: No acute finding. Other: None. IMPRESSION: 1. Stable chronic small-vessel ischemic changes and cerebral atrophy. 2. No acute intracranial process. Electronically Signed   By: Randa Ngo M.D.   On: 01/30/2022 17:29   MR Brain Wo Contrast (neuro protocol)  Result Date: 01/30/2022 CLINICAL DATA:  Weakness, dizziness and vertigo EXAM: MRI HEAD WITHOUT CONTRAST TECHNIQUE: Multiplanar, multiecho pulse sequences of the brain and surrounding structures were obtained without intravenous contrast. COMPARISON:  05/27/2016 FINDINGS: Brain: Punctate focus of abnormal diffusion restriction within the left medulla oblongata. No other diffusion abnormality. No acute or chronic hemorrhage. Hyperintense T2-weighted signal is widespread throughout the white matter. Diffuse, severe atrophy. The midline structures are normal. Vascular: Major flow voids are preserved. Skull and upper cervical spine: Normal calvarium and skull base. Visualized upper cervical spine and soft tissues are normal. Sinuses/Orbits:No paranasal sinus fluid  levels or advanced mucosal thickening. No mastoid or middle ear effusion. Normal orbits. IMPRESSION: 1. Small acute infarct of the left medulla oblongata. No hemorrhage or mass effect. 2. Severe atrophy and chronic ischemic microangiopathy. Electronically Signed   By: Ulyses Jarred M.D.   On: 01/30/2022 21:28   DG Chest Port 1 View  Result Date: 01/30/2022 CLINICAL DATA:  Altered mental status question stroke, leg weakness, increased confusion and hallucinations today EXAM: PORTABLE CHEST 1 VIEW COMPARISON:  Portable exam 1703 hours compared to 09/04/2021 FINDINGS: Normal heart size, mediastinal contours, and pulmonary vascularity. Atherosclerotic calcification aorta.  Persistent LEFT pleural effusion and basilar atelectasis. Remaining lungs clear. No new areas of consolidation or pneumothorax. Bones demineralized. IMPRESSION: Persistent LEFT pleural effusion and basilar atelectasis. Aortic Atherosclerosis (ICD10-I70.0). Electronically Signed   By: Lavonia Dana M.D.   On: 01/30/2022 17:31    EKG: Independently reviewed.  Sinus bradycardia with heart rate around 55 bpm.  Assessment/Plan Principal Problem:   Acute CVA (cerebrovascular accident) ([place]) Active Problems:   Transitional cell carcinoma of bladder (HCC)   Hypertension   PAD (peripheral artery disease) ([place])   HLD (hyperlipidemia)   Dementia without behavioral disturbance ([place])    Acute CVA -discussed with neurologist.  At this time plan is to keep patient on neurochecks.  Patient did pass stroke swallow.  Patient is only on Plavix neurologist recommended adding aspirin.  Patient is intolerant to statins.  Patient will be continued on Zetia.  Check hemoglobin A1c lipid panel 2D echo physical therapy consult. Hypertension we will hold off patient's Imdur to allow for permissive hypertension. Anemia appears to be mildly worsening from recent past.  Follow CBC check anemia panel. History of dementia on Aricept.  Noted patient is mildly bradycardic if becomes more bradycardic may have to hold Aricept.  Check TSH. History of peripheral artery disease on Plavix and statins. Persistent left pleural effusion seen on chest x-ray will need further work-up as outpatient. History of bladder cancer in remission.   DVT prophylaxis: Lovenox. Code Status: Full code. Family Communication: Patient's daughter at the bedside. Disposition Plan: Home when stable. Consults called: Neurology. Admission status: Observation.   Rise Patience MD [facility] Pager [phone].  If 7PM-7AM, please contact night-coverage [email or web address] Password [email or web address]  01/31/2022, 2:26 AM

## 2022-02-01 DIAGNOSIS — E785 Hyperlipidemia, unspecified: Secondary | ICD-10-CM

## 2022-02-01 DIAGNOSIS — C679 Malignant neoplasm of bladder, unspecified: Secondary | ICD-10-CM

## 2022-02-01 MED ORDER — ASPIRIN 81 MG PO TBEC: 81.0000 mg | DELAYED_RELEASE_TABLET | Freq: Every day | ORAL | 0 refills | Status: DC

## 2022-02-01 MED ORDER — ISOSORBIDE MONONITRATE ER 60 MG PO TB24: 60.0000 mg | ORAL_TABLET | Freq: Every morning | ORAL | 0 refills | Status: DC

## 2022-02-01 NOTE — Plan of Care (Signed)
°  Problem: Education: Goal: Knowledge of General Education information will improve Description: Including pain rating scale, medication(s)/side effects and non-pharmacologic comfort measures Outcome: Adequate for Discharge   Problem: Health Behavior/Discharge Planning: Goal: Ability to manage health-related needs will improve Outcome: Adequate for Discharge   Problem: Clinical Measurements: Goal: Ability to maintain clinical measurements within normal limits will improve Outcome: Adequate for Discharge Goal: Will remain free from infection Outcome: Adequate for Discharge Goal: Respiratory complications will improve Outcome: Adequate for Discharge   Problem: Safety: Goal: Ability to remain free from injury will improve Outcome: Adequate for Discharge   Problem: Education: Goal: Knowledge of disease or condition will improve Outcome: Adequate for Discharge Goal: Knowledge of secondary prevention will improve (SELECT ALL) Outcome: Adequate for Discharge Goal: Knowledge of patient specific risk factors will improve (INDIVIDUALIZE FOR PATIENT) Outcome: Adequate for Discharge Goal: Individualized Educational Video(s) Outcome: Adequate for Discharge   Problem: Nutrition: Goal: Risk of aspiration will decrease Outcome: Adequate for Discharge   Problem: Intracerebral Hemorrhage Tissue Perfusion: Goal: Complications of Intracerebral Hemorrhage will be minimized Outcome: Adequate for Discharge   Problem: Ischemic Stroke/TIA Tissue Perfusion: Goal: Complications of ischemic stroke/TIA will be minimized Outcome: Adequate for Discharge

## 2022-02-01 NOTE — Progress Notes (Signed)
Physical Therapy Treatment Patient Details Name: Paul [name] MRN: [id number] DOB: [birth year] Today's Date: 02/01/2022   History of Present Illness Pt is an 88 y.o. male who presented to the ED from home with c/o dizziness and weakness. MRI revealed small acute infarct of the left medulla oblongata. PMH:  dementia, hypertension, bladder cancer in remission, gastric ulcer    PT Comments    Pt supine in bed sleeping soundly.  Able to rouse.  Pt performed transfer training and short bouts of gt training.  He was able to negotiate x2 steps with mod assistance.  Handouts on stair training, falls, WC negotiation of curb, and exercise program were issued.  Educated to use gt belt when ever patient is on his feet.  Educated to use WC vs. Ambulation as much as possible.  Family very anxious but reports she has a son who can help him physically.  Family very eager to learn and take notes.     Recommendations for follow up therapy are one component of a multi-disciplinary discharge planning process, led by the attending physician.  Recommendations may be updated based on patient status, additional functional criteria and insurance authorization.  Follow Up Recommendations  Home health PT     Assistance Recommended at Discharge Frequent or constant Supervision/Assistance  Patient can return home with the following A lot of help with bathing/dressing/bathroom;Assistance with cooking/housework;Direct supervision/assist for medications management;Assist for transportation;Help with stairs or ramp for entrance;A little help with walking and/or transfers   Equipment Recommendations  Wheelchair (measurements PT);Wheelchair cushion (measurements PT)    Recommendations for Other Services       Precautions / Restrictions Precautions Precautions: Fall Restrictions Weight Bearing Restrictions: No     Mobility  Bed Mobility Overal bed mobility: Needs Assistance Bed Mobility: Supine to Sit, Sit to  Supine     Supine to sit: Min guard     General bed mobility comments: Cues for handplacement to rise into sitting.  Hand over hand to reach for rail. Increased time noted.    Transfers Overall transfer level: Needs assistance Equipment used: Rolling walker (2 wheels) Transfers: Sit to/from Stand Sit to Stand: Min assist           General transfer comment: Cues for hand placement to and from seated surface.  Minor posterior lean required min assistance to correct.    Ambulation/Gait Ambulation/Gait assistance: Min assist Gait Distance (Feet): 6 Feet (x2) Assistive device: Rolling walker (2 wheels) Gait Pattern/deviations: Step-through pattern, Decreased stride length, Ataxic Gait velocity: decreased     General Gait Details: Pt continues with unsteady gt that is more prominent with turn and backing.   Stairs Stairs: Yes Stairs assistance: Mod assist Stair Management: Sideways, Step to pattern, Forwards, One rail Left Number of Stairs: 2 General stair comments: Cues for sequencing and hand placement.  Holds to L rail only to simulate home environment.  Forward to ascend and sideways to descend.   Information systems manager mobility: Yes Wheelchair propulsion: Both upper extremities, Both lower extermities Wheelchair parts: Needs assistance (MIN A) Distance: 60 ft x 2. Wheelchair Assistance Details (indicate cue type and reason): Educated family on WC parts.  Instructed patient in propulsion and locking and unlocking brakes.  Modified Rankin (Stroke Patients Only) Modified Rankin (Stroke Patients Only) Pre-Morbid Rankin Score: Moderately severe disability Modified Rankin: Moderately severe disability     Balance Overall balance assessment: Needs assistance   Sitting balance-Leahy Scale: Fair   Postural control: Posterior lean  Standing balance-Leahy Scale: Poor                              Cognition  Arousal/Alertness: Awake/alert Behavior During Therapy: WFL for tasks assessed/performed Overall Cognitive Status: History of cognitive impairments - at baseline                                 General Comments: Dementia at baseline, some hallucinations noted this hospital stay        Exercises      General Comments        Pertinent Vitals/Pain Pain Assessment Pain Assessment: No/denies pain    Home Living                          Prior Function            PT Goals (current goals can now be found in the care plan section) Acute Rehab PT Goals Patient Stated Goal: home Potential to Achieve Goals: Good Progress towards PT goals: Progressing toward goals    Frequency    Min 4X/week      PT Plan Current plan remains appropriate    Co-evaluation              AM-PAC PT "6 Clicks" Mobility   Outcome Measure  Help needed turning from your back to your side while in a flat bed without using bedrails?: A Little Help needed moving from lying on your back to sitting on the side of a flat bed without using bedrails?: A Little Help needed moving to and from a bed to a chair (including a wheelchair)?: A Lot Help needed standing up from a chair using your arms (e.g., wheelchair or bedside chair)?: A Lot Help needed to walk in hospital room?: A Little Help needed climbing 3-5 steps with a railing? : A Lot 6 Click Score: 15    End of Session Equipment Utilized During Treatment: Gait belt Activity Tolerance: Patient tolerated treatment well Patient left: in chair;with call bell/phone within reach;with chair alarm set;with family/visitor present Nurse Communication: Mobility status PT Visit Diagnosis: History of falling (Z91.81);Other abnormalities of gait and mobility (R26.89)     Time: 6948-5462 PT Time Calculation (min) (ACUTE ONLY): 38 min  Charges:  $Gait Training: 8-22 mins $Therapeutic Activity: 23-37 mins                     Jowanda Heeg R. , PTA Acute Rehabilitation Services Pager [phone] Office [phone]     Rykker Coviello Eli Hose 02/01/2022, 12:12 PM

## 2022-02-01 NOTE — Discharge Summary (Signed)
Physician Discharge Summary   Patient: Paul [name] MRN: [id number] DOB: 12-Feb-1930  Admit date:     01/30/2022  Discharge date: 02/01/22  Discharge Physician: Patrecia Pour   PCP: Crist Infante, MD   Recommendations at discharge:  Monitor BP with goal SBP 130-160's to maintain CPP in setting of advanced cerebrovascular atherosclerotic disease.  This patient experiences severe acute delirium in the hospital setting. Any need for admission/observation/institutionalization should be assessed carefully and discussed with family.  Discharge Diagnoses: Principal Problem:   Acute CVA (cerebrovascular accident) [facility]) Active Problems:   Transitional cell carcinoma of bladder ([place])   Hypertension   PAD (peripheral artery disease) ([place])   HLD (hyperlipidemia)   Dementia without behavioral disturbance [facility])  Hospital Course: Paul [name] is an 88 y.o. male with a history of dementia, HTN, bladder CA in remission, gastric ulcer, admission for covid-19 in Sept 2022, who presented to the ED 2/16 with a few days of apraxia, abnormal sensation and weakness of the L > R leg as well as left eye/facial droop. MRI revealed stroke involving the left medulla. CTA of the head/neck showed multiple posterior circulation stenoses with occlusion, suspected to be chronic per neurology. Patient admitted for further stroke work-up. He passed a swallow screen and will receive home health therapies after discharge.   Assessment and Plan: Acute CVA of left medulla: Likely perforator artery ischemic infarct from progressive atherosclerotic cerebrovascular disease. CT head and neck showed diminutive posterior circulation with distal basilar artery occlusion, left P2, right V1 and V2 stenosis, left VA V4 occlusion. - D/w Dr. Erlinda Hong who recommended DAPT x3 months, then return to plavix alone give left VA V4 occlusion. - Continue zetia (statin intolerant), LDL is at goal at 68.   Dementia with acute delirium, sundowning:  Has predictable pattern of delirium when hospitalized. This would also be exacerbated by placing in facility, so plan is to abbreviate hospital stay as much as possible and return home with assistance. - THESE SYMPTOMS ARE SEVERE AND UNSETTLING TO PT AND FAMILY. Not improved with trial of melatonin and zyprexa. Haldol did ultimately control agitation. - Continue aricept - Delirium precautions.     Dysphagia: Continue diet as tolerated per SLP.   Hypertension:  - Continue imdur. Recently taken of lisinopril due to hypotension and says cough has improved.    Sinus bradycardia: Mild, intermittent. - Consider holding aricept if becomes severe or symptomatic.    Anemia: No bleeding noted, will monitor.    History of PAD: Continue antiplatelet, lipid therapy. No critical ischemia noted.   Persistent left pleural effusion seen on chest x-ray will need further work-up as outpatient. No hypoxia.    History of bladder cancer in remission.    Consultants: Neurology Procedures performed: None  Disposition: Home Diet recommendation:  Discharge Diet Orders (From admission, onward)     Start     Ordered   02/01/22 0000  Diet - low sodium heart healthy        02/01/22 0951           Cardiac diet  DISCHARGE MEDICATION: Allergies as of 02/01/2022       Reactions   Livalo [pitavastatin] Other (See Comments)   Cramping and back pain   Livalo [pitavastatin] Other (See Comments)   Cramping and back pain   Other Diarrhea, Other (See Comments)   Unnamed patch and tablets for dementia caused diarrhea (Possibly galantamine, from outside source)         Medication List  TAKE these medications    acetaminophen 500 MG tablet Commonly known as: TYLENOL Take 500 mg by mouth daily as needed for mild pain or headache.   aspirin 81 MG EC tablet Take 1 tablet (81 mg total) by mouth daily.   clopidogrel 75 MG tablet Commonly known as: PLAVIX Take 75 mg by mouth daily.   donepezil  5 MG tablet Commonly known as: ARICEPT Take 5 mg by mouth daily.   escitalopram 20 MG tablet Commonly known as: LEXAPRO Take 20 mg by mouth daily.   ezetimibe 10 MG tablet Commonly known as: ZETIA Take 10 mg by mouth daily.   isosorbide mononitrate 60 MG 24 hr tablet Commonly known as: IMDUR Take 1 tablet (60 mg total) by mouth every morning. do not give if systolic blood pressure < 130 What changed: additional instructions               Durable Medical Equipment  (From admission, onward)           Start     Ordered   01/31/22 1607  For home use only DME lightweight manual wheelchair with seat cushion  Once       Comments: Patient suffers from stroke which impairs their ability to perform daily activities like bathing, grooming, and toileting in the home.  A walker will not resolve  issue with performing activities of daily living. A wheelchair will allow patient to safely perform daily activities. Patient is not able to propel themselves in the home using a standard weight wheelchair due to general weakness. Patient can self propel in the lightweight wheelchair. Length of need 6 months . Accessories: elevating leg rests (ELRs), wheel locks, extensions and anti-tippers.   01/31/22 1606            Follow-up Information     [facility] Follow up.   Specialty: Home Health Services Why: The home health agency will contact you for the first home visit. Contact information: [address] [place] [id number] [phone]         [facility]. Schedule an appointment as soon as possible for a visit in 1 month(s).   Specialty: Neurology Why: stroke clinic Contact information: [place] [phone]        Crist Infante, MD. Schedule an appointment as soon as possible for a visit in 1 week(s).   Specialty: Internal Medicine Why: for blood pressure recheck. Contact  information: [place] [address] [phone]                 Discharge Exam: [name] Weights   01/30/22 1602  Weight: 64.9 kg  No distress Rousable, not oriented, not cooperative with exam at this time.  Condition at discharge: stable  The results of significant diagnostics from this hospitalization (including imaging, microbiology, ancillary and laboratory) are listed below for reference.   Imaging Studies: CT ANGIO HEAD NECK W WO CM  Result Date: 01/31/2022 CLINICAL DATA:  Stroke/TIA EXAM: CT ANGIOGRAPHY HEAD AND NECK TECHNIQUE: Multidetector CT imaging of the head and neck was performed using the standard protocol during bolus administration of intravenous contrast. Multiplanar CT image reconstructions and MIPs were obtained to evaluate the vascular anatomy. Carotid stenosis measurements (when applicable) are obtained utilizing NASCET criteria, using the distal internal carotid diameter as the denominator. RADIATION DOSE REDUCTION: This exam was performed according to the departmental dose-optimization program which includes automated exposure control, adjustment of  the mA and/or kV according to patient size and/or use of iterative reconstruction technique. CONTRAST:  9mL OMNIPAQUE IOHEXOL 350 MG/ML SOLN COMPARISON:  None. FINDINGS: CTA NECK FINDINGS SKELETON: There is no bony spinal canal stenosis. No lytic or blastic lesion. OTHER NECK: Normal pharynx, larynx and major salivary glands. No cervical lymphadenopathy. Unremarkable thyroid gland. UPPER CHEST: Small left pleural effusion AORTIC ARCH: There is calcific atherosclerosis of the aortic arch. There is no aneurysm, dissection or hemodynamically significant stenosis of the visualized portion of the aorta. Conventional 3 vessel aortic branching pattern. The visualized proximal subclavian arteries are widely patent. RIGHT CAROTID SYSTEM: No dissection, occlusion or aneurysm. Mild atherosclerotic calcification at  the carotid bifurcation without hemodynamically significant stenosis. LEFT CAROTID SYSTEM: No dissection, occlusion or aneurysm. Mild atherosclerotic calcification at the carotid bifurcation without hemodynamically significant stenosis. VERTEBRAL ARTERIES: Right vertebral artery V1 and proximal V2 segments are occluded. There is return of opacification of the distal right V2 segment. There is no dissection, occlusion or flow-limiting stenosis to the skull base (V1-V3 segments). CTA HEAD FINDINGS POSTERIOR CIRCULATION: --Vertebral arteries: Normal V4 segments. --Inferior cerebellar arteries: Normal. --Basilar artery: The distal basilar artery is occluded. --Superior cerebellar arteries: Normal. --Posterior cerebral arteries (PCA): Left PCA is occluded at the proximal P2 segment. Right PCA is patent. ANTERIOR CIRCULATION: --Intracranial internal carotid arteries: Normal. --Anterior cerebral arteries (ACA): Normal. Both A1 segments are present. Patent anterior communicating artery (a-comm). --Middle cerebral arteries (MCA): Normal. VENOUS SINUSES: As permitted by contrast timing, patent. ANATOMIC VARIANTS: Fetal origin of the right posterior cerebral artery. Review of the MIP images confirms the above findings. IMPRESSION: 1. Multifocal posterior circulation occlusion, including the distal basilar artery, left PCA P2 segment and right vertebral artery V1 and proximal V2 segments. These findings are age indeterminate. 2. Small left pleural effusion. Aortic Atherosclerosis (ICD10-I70.0). Electronically Signed   By: Ulyses Jarred M.D.   On: 01/31/2022 02:11   CT HEAD WO CONTRAST  Result Date: 01/30/2022 CLINICAL DATA:  Weakness, dizziness, vertigo for 4 days EXAM: CT HEAD WITHOUT CONTRAST TECHNIQUE: Contiguous axial images were obtained from the base of the skull through the vertex without intravenous contrast. RADIATION DOSE REDUCTION: This exam was performed according to the departmental dose-optimization program  which includes automated exposure control, adjustment of the mA and/or kV according to patient size and/or use of iterative reconstruction technique. COMPARISON:  09/04/2021 FINDINGS: Brain: Stable hypodensities within the periventricular white matter consistent with chronic small vessel ischemic changes. Diffuse cerebral atrophy and ex vacuo dilatation of the lateral ventricles unchanged. No evidence of acute infarct or hemorrhage. Remaining midline structures are unremarkable. No acute extra-axial fluid collections. No mass effect. Vascular: Stable atherosclerosis.  No hyperdense vessel. Skull: Normal. Negative for fracture or focal lesion. Sinuses/Orbits: No acute finding. Other: None. IMPRESSION: 1. Stable chronic small-vessel ischemic changes and cerebral atrophy. 2. No acute intracranial process. Electronically Signed   By: Randa Ngo M.D.   On: 01/30/2022 17:29   MR Brain Wo Contrast (neuro protocol)  Result Date: 01/30/2022 CLINICAL DATA:  Weakness, dizziness and vertigo EXAM: MRI HEAD WITHOUT CONTRAST TECHNIQUE: Multiplanar, multiecho pulse sequences of the brain and surrounding structures were obtained without intravenous contrast. COMPARISON:  05/27/2016 FINDINGS: Brain: Punctate focus of abnormal diffusion restriction within the left medulla oblongata. No other diffusion abnormality. No acute or chronic hemorrhage. Hyperintense T2-weighted signal is widespread throughout the white matter. Diffuse, severe atrophy. The midline structures are normal. Vascular: Major flow voids are preserved. Skull and upper cervical spine: Normal calvarium  and skull base. Visualized upper cervical spine and soft tissues are normal. Sinuses/Orbits:No paranasal sinus fluid levels or advanced mucosal thickening. No mastoid or middle ear effusion. Normal orbits. IMPRESSION: 1. Small acute infarct of the left medulla oblongata. No hemorrhage or mass effect. 2. Severe atrophy and chronic ischemic microangiopathy.  Electronically Signed   By: Ulyses Jarred M.D.   On: 01/30/2022 21:28   DG Chest Port 1 View  Result Date: 01/30/2022 CLINICAL DATA:  Altered mental status question stroke, leg weakness, increased confusion and hallucinations today EXAM: PORTABLE CHEST 1 VIEW COMPARISON:  Portable exam 1703 hours compared to 09/04/2021 FINDINGS: Normal heart size, mediastinal contours, and pulmonary vascularity. Atherosclerotic calcification aorta. Persistent LEFT pleural effusion and basilar atelectasis. Remaining lungs clear. No new areas of consolidation or pneumothorax. Bones demineralized. IMPRESSION: Persistent LEFT pleural effusion and basilar atelectasis. Aortic Atherosclerosis (ICD10-I70.0). Electronically Signed   By: Lavonia Dana M.D.   On: 01/30/2022 17:31   ECHOCARDIOGRAM COMPLETE  Result Date: 01/31/2022    ECHOCARDIOGRAM REPORT   Patient Name:   DELMA VILLALVA Date of Exam: 01/31/2022 Medical Rec #:  [id number]       Height:       67.0 in Accession #:    [id number]      Weight:       143.0 lb Date of Birth:  [birth year]       BSA:          1.754 m Patient Age:    22 years        BP:           147/73 mmHg Patient Gender: M               HR:           61 bpm. Exam Location:  Inpatient Procedure: 2D Echo, Cardiac Doppler and Color Doppler Indications:    Stroke  History:        Patient has no prior history of Echocardiogram examinations.                 Risk Factors:Hypertension.  Sonographer:    Jyl Heinz Referring Phys: [place]  1. Left ventricular ejection fraction, by estimation, is 60 to 65%. The left ventricle has normal function. The left ventricle has no regional wall motion abnormalities. There is mild left ventricular hypertrophy. Left ventricular diastolic parameters are consistent with Grade I diastolic dysfunction (impaired relaxation).  2. Peak RV-RA gradient 34 mmHg. The IVC was not well-visualized. Right ventricular systolic function is normal. The right ventricular  size is normal.  3. The mitral valve is normal in structure. No evidence of mitral valve regurgitation. No evidence of mitral stenosis.  4. The aortic valve is tricuspid. Aortic valve regurgitation is not visualized. Aortic valve sclerosis/calcification is present, without any evidence of aortic stenosis. FINDINGS  Left Ventricle: Left ventricular ejection fraction, by estimation, is 60 to 65%. The left ventricle has normal function. The left ventricle has no regional wall motion abnormalities. The left ventricular internal cavity size was normal in size. There is  mild left ventricular hypertrophy. Left ventricular diastolic parameters are consistent with Grade I diastolic dysfunction (impaired relaxation). Right Ventricle: Peak RV-RA gradient 34 mmHg. The IVC was not well-visualized. The right ventricular size is normal. No increase in right ventricular wall thickness. Right ventricular systolic function is normal. Left Atrium: Left atrial size was normal in size. Right Atrium: Right atrial size was normal in size. Pericardium: There is  no evidence of pericardial effusion. Mitral Valve: The mitral valve is normal in structure. Mild mitral annular calcification. No evidence of mitral valve regurgitation. No evidence of mitral valve stenosis. Tricuspid Valve: The tricuspid valve is normal in structure. Tricuspid valve regurgitation is trivial. Aortic Valve: The aortic valve is tricuspid. Aortic valve regurgitation is not visualized. Aortic valve sclerosis/calcification is present, without any evidence of aortic stenosis. Aortic valve peak gradient measures 5.4 mmHg. Pulmonic Valve: The pulmonic valve was normal in structure. Pulmonic valve regurgitation is trivial. Aorta: The aortic root is normal in size and structure. Venous: The inferior vena cava was not well visualized. IAS/Shunts: No atrial level shunt detected by color flow Doppler.  LEFT VENTRICLE PLAX 2D LVIDd:         3.90 cm     Diastology LVIDs:          3.00 cm     LV e' medial:    6.42 cm/s LV PW:         1.30 cm     LV E/e' medial:  12.3 LV IVS:        1.30 cm     LV e' lateral:   5.66 cm/s LVOT diam:     2.00 cm     LV E/e' lateral: 14.0 LV SV:         72 LV SV Index:   41 LVOT Area:     3.14 cm  LV Volumes (MOD) LV vol d, MOD A2C: 69.8 ml LV vol d, MOD A4C: 63.2 ml LV vol s, MOD A2C: 27.2 ml LV vol s, MOD A4C: 22.5 ml LV SV MOD A2C:     42.6 ml LV SV MOD A4C:     63.2 ml LV SV MOD BP:      42.9 ml RIGHT VENTRICLE RV Basal diam:  3.10 cm RV Mid diam:    2.80 cm RV S prime:     12.40 cm/s TAPSE (M-mode): 2.4 cm LEFT ATRIUM             Index        RIGHT ATRIUM           Index LA diam:        3.40 cm 1.94 cm/m   RA Area:     11.30 cm LA Vol (A2C):   23.5 ml 13.40 ml/m  RA Volume:   24.10 ml  13.74 ml/m LA Vol (A4C):   28.3 ml 16.14 ml/m LA Biplane Vol: 26.1 ml 14.88 ml/m  AORTIC VALVE AV Area (Vmax): 2.84 cm AV Vmax:        116.00 cm/s AV Peak Grad:   5.4 mmHg LVOT Vmax:      105.00 cm/s LVOT Vmean:     70.400 cm/s LVOT VTI:       0.230 m  AORTA Ao Root diam: 3.30 cm Ao Asc diam:  3.20 cm MITRAL VALVE               TRICUSPID VALVE MV Area (PHT): 3.10 cm    TR Peak grad:   34.3 mmHg MV Decel Time: 245 msec    TR Vmax:        293.00 cm/s MV E velocity: 79.20 cm/s MV A velocity: 87.90 cm/s  SHUNTS MV E/A ratio:  0.90        Systemic VTI:  0.23 m  Systemic Diam: 2.00 cm Dalton McleanMD Electronically signed by Franki Monte Signature Date/Time: 01/31/2022/2:23:51 PM    Final     Microbiology: Results for orders placed or performed during the hospital encounter of 01/30/22  Resp Panel by RT-PCR (Flu A&B, Covid) Nasopharyngeal Swab     Status: None   Collection Time: 01/30/22  4:25 PM   Specimen: Nasopharyngeal Swab; Nasopharyngeal(NP) swabs in vial transport medium  Result Value Ref Range Status   SARS Coronavirus 2 by RT PCR NEGATIVE NEGATIVE Final    Comment: (NOTE) SARS-CoV-2 target nucleic acids are NOT DETECTED.  The  SARS-CoV-2 RNA is generally detectable in upper respiratory specimens during the acute phase of infection. The lowest concentration of SARS-CoV-2 viral copies this assay can detect is 138 copies/mL. A negative result does not preclude SARS-Cov-2 infection and should not be used as the sole basis for treatment or other patient management decisions. A negative result may occur with  improper specimen collection/handling, submission of specimen other than nasopharyngeal swab, presence of viral mutation(s) within the areas targeted by this assay, and inadequate number of viral copies(<138 copies/mL). A negative result must be combined with clinical observations, patient history, and epidemiological information. The expected result is Negative.  Fact Sheet for Patients:  EntrepreneurPulse.com.au  Fact Sheet for Healthcare Providers:  IncredibleEmployment.be  This test is no t yet approved or cleared by the [place] FDA and  has been authorized for detection and/or diagnosis of SARS-CoV-2 by FDA under an Emergency Use Authorization (EUA). This EUA will remain  in effect (meaning this test can be used) for the duration of the COVID-19 declaration under Section 564(b)(1) of the Act, 21 U.S.C.section 360bbb-3(b)(1), unless the authorization is terminated  or revoked sooner.       Influenza A by PCR NEGATIVE NEGATIVE Final   Influenza B by PCR NEGATIVE NEGATIVE Final    Comment: (NOTE) The Xpert Xpress SARS-CoV-2/FLU/RSV plus assay is intended as an aid in the diagnosis of influenza from Nasopharyngeal swab specimens and should not be used as a sole basis for treatment. Nasal washings and aspirates are unacceptable for Xpert Xpress SARS-CoV-2/FLU/RSV testing.  Fact Sheet for Patients: EntrepreneurPulse.com.au  Fact Sheet for Healthcare Providers: IncredibleEmployment.be  This test is not yet approved or  cleared by the [place] FDA and has been authorized for detection and/or diagnosis of SARS-CoV-2 by FDA under an Emergency Use Authorization (EUA). This EUA will remain in effect (meaning this test can be used) for the duration of the COVID-19 declaration under Section 564(b)(1) of the Act, 21 U.S.C. section 360bbb-3(b)(1), unless the authorization is terminated or revoked.  Performed at [facility], [place] [address], [place], [place] [address]     Labs: CBC: Recent Labs  Lab 01/30/22 1701 01/30/22 1716 01/31/22 0250  WBC 6.9  --  6.3  NEUTROABS 5.4  --   --   HGB 10.9* 10.9* 10.8*  HCT 35.1* 32.0* 32.9*  MCV 91.2  --  88.4  PLT 334  --  932   Basic Metabolic Panel: Recent Labs  Lab 01/30/22 1701 01/30/22 1716 01/31/22 0250  NA 136 140  --   K 4.3 4.0  --   CL 103 103  --   CO2 25  --   --   GLUCOSE 107* 96  --   BUN 17 18  --   CREATININE 1.04 1.00 0.95  CALCIUM 8.6*  --   --    Liver Function Tests: Recent Labs  Lab  01/30/22 1701  AST 19  ALT 9  ALKPHOS 67  BILITOT 0.4  PROT 5.7*  ALBUMIN 3.1*   CBG: No results for input(s): GLUCAP in the last 168 hours.  Discharge time spent: greater than 30 minutes.  Signed: Patrecia Pour, MD [facility] 02/01/2022

## 2022-02-10 DIAGNOSIS — F0390 Unspecified dementia without behavioral disturbance: Secondary | ICD-10-CM | POA: Diagnosis not present

## 2022-02-10 DIAGNOSIS — L8990 Pressure ulcer of unspecified site, unspecified stage: Secondary | ICD-10-CM | POA: Diagnosis not present

## 2022-02-10 DIAGNOSIS — E785 Hyperlipidemia, unspecified: Secondary | ICD-10-CM | POA: Diagnosis not present

## 2022-02-10 DIAGNOSIS — I639 Cerebral infarction, unspecified: Secondary | ICD-10-CM | POA: Diagnosis not present

## 2022-03-10 DIAGNOSIS — I639 Cerebral infarction, unspecified: Secondary | ICD-10-CM | POA: Diagnosis not present

## 2022-03-10 DIAGNOSIS — F0390 Unspecified dementia without behavioral disturbance: Secondary | ICD-10-CM | POA: Diagnosis not present

## 2022-03-10 DIAGNOSIS — L8990 Pressure ulcer of unspecified site, unspecified stage: Secondary | ICD-10-CM | POA: Diagnosis not present

## 2022-03-10 DIAGNOSIS — E785 Hyperlipidemia, unspecified: Secondary | ICD-10-CM | POA: Diagnosis not present

## 2022-03-14 DIAGNOSIS — I10 Essential (primary) hypertension: Secondary | ICD-10-CM | POA: Diagnosis not present

## 2022-03-14 DIAGNOSIS — F0390 Unspecified dementia without behavioral disturbance: Secondary | ICD-10-CM | POA: Diagnosis not present

## 2022-03-14 DIAGNOSIS — E785 Hyperlipidemia, unspecified: Secondary | ICD-10-CM | POA: Diagnosis not present

## 2022-03-17 ENCOUNTER — Encounter: Payer: Self-pay | Admitting: Adult Health

## 2022-03-17 ENCOUNTER — Ambulatory Visit: Payer: PPO | Admitting: Adult Health

## 2022-03-17 VITALS — BP 115/58 | HR 59 | Ht 67.0 in | Wt 142.0 lb

## 2022-03-17 DIAGNOSIS — I6523 Occlusion and stenosis of bilateral carotid arteries: Secondary | ICD-10-CM | POA: Diagnosis not present

## 2022-03-17 DIAGNOSIS — I639 Cerebral infarction, unspecified: Secondary | ICD-10-CM

## 2022-03-17 DIAGNOSIS — Z09 Encounter for follow-up examination after completed treatment for conditions other than malignant neoplasm: Secondary | ICD-10-CM

## 2022-03-17 NOTE — Progress Notes (Signed)
?[facility] ?[address] ?. [place] [address] ?[phone] ? ?     HOSPITAL FOLLOW UP NOTE ? ?Mr. [name] ?Date of Birth:  1930/08/09 ?Medical Record Number:  [id number]  ? ?Reason for Referral:  hospital stroke follow up ? ? ? ?SUBJECTIVE: ? ? ?CHIEF COMPLAINT:  ?Chief Complaint  ?Patient presents with  ? Follow-up  ?  Rm 2 with spouse Raquel Sarna & son Nicki Reaper ?Pt is well, having some L sided facial soreness. Overral stable   ? ? ?HPI:  ? ?Mr. ARISTIDE WAGGLE is a 88 y.o. male with history of hypertension, hyperlipidemia, gastric ulcer, dementia who presented on 01/30/2022 with dizziness, vertigo, left foot weakness, left facial droop and hallucination.  Personally reviewed hospitalization pertinent progress notes, lab work and imaging.  Evaluated by Dr. 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  CTA head/neck showed diminutive posterior circulation with distal BA occlusion, L P2, R V1 and V2 stenosis, and L VA V4 occlusion.  EF 60 to 65%.  LDL 68.  A1c 5.3.  Recommended DAPT for 3 months then Plavix alone given left VA occlusion and resumed home dose Zetia 10 mg daily.  HTN stable and recommended long-term BP goal 130-150 given posterior circulation severe stenosis.  No prior stroke history.  Baseline structural dysphagia per OP MBS, eval by SLP without signs of dysphagia during evaluation. Therapy eval's recommended home health PT, SLP and OT. ? ? ?Today, 03/17/2022, patient being seen for initial hospital follow-up accompanied by his wife and son.  Overall doing well. Getting stronger and gait/imbalance improving.  Currently working with home health PT/OT. Has not seen SLP. He does report pain above left eye (specifically points to eyebrow) with dull ache sensation and at times slightly swollen per son. He would take tylenol with resolution. He has not had any pain over the past week and surprised when pushing on that area  today he has no pain or sensitivity.  Denies new stroke/TIA symptoms.  Compliant on both aspirin and Plavix as well as Zetia without side effects.  Blood pressure today 115/58, monitor at home daily and typically 150s at home - has been needing to take Imdur daily as current instructions are to hold if SBP<130. Pharmacist at PCP office calls and follows up on blood pressure.  He is routinely followed by Dr. Joylene Draft.  No further concerns at this time. ? ? ? ? ? ?PERTINENT IMAGING ? ?Per hospitalization 01/30/2022 ?CT no acute abnormality. ?CT head and neck showed diminutive posterior circulation with distal basilar artery occlusion, left P2, right V1 and V2 stenosis, left VA V4 occlusion. ?MRI left lateral medullary small infarct ?2D Echo EF 60 to 65% ?LDL 68 ?HgbA1c 5.3 ? ? ? ?ROS:   ?14 system review of systems performed and negative with exception of those listed in HPI ? ?PMH:  ?Past Medical History:  ?Diagnosis Date  ? Back pain   ? Bladder tumor   ? BPH (benign prostatic hyperplasia)   ? Hyperlipidemia   ? Hypertension   ? Impaired hearing BILATERAL HEARING AIDS  ? Personal history of gastric ulcer   ? ? ?PSH:  ?Past Surgical History:  ?Procedure Laterality Date  ? APPENDECTOMY  1957  ? CATARACT EXTRACTION W/ INTRAOCULAR LENS  IMPLANT, BILATERAL    ? CYSTOSCOPY WITH BIOPSY  03/08/2012  ? Procedure: CYSTOSCOPY WITH BIOPSY;  Surgeon: Claybon Jabs, MD;  Location: [facility];  Service: Urology;  Laterality: N/A;  gyrus  ? TONSILLECTOMY  CHILD  ? TRANSURETHRAL INCISION OF PROSTATE  03/08/2012  ? Procedure: TRANSURETHRAL INCISION OF THE PROSTATE (TUIP);  Surgeon: Claybon Jabs, MD;  Location: [facility] ([facility]);  Service: Urology;  Laterality: N/A;  ? TRANSURETHRAL RESECTION OF BLADDER TUMOR  01/26/2012  ? Procedure: TRANSURETHRAL RESECTION OF BLADDER TUMOR (TURBT);  Surgeon: Claybon Jabs, MD;  Location: [facility];  Service: Urology;  Laterality: N/A;  GYRUS ?MYTOMICIN C ?   ? ? ?Social History:  ?Social History  ? ?Socioeconomic History  ? Marital status: Married  ?  Spouse name: Not on file  ? Number of children: 3  ? Years of education: Not on file  ? Highest education level: Not on file  ?Occupational History  ? Not on file  ?Tobacco Use  ? Smoking status: Former  ?  Years: 30.00  ?  Types: Cigarettes  ?  Quit date: 01/20/1981  ?  Years since quitting: 41.1  ? Smokeless tobacco: Never  ?Substance and Sexual Activity  ? Alcohol use: No  ? Drug use: No  ? Sexual activity: Not on file  ?Other Topics Concern  ? Not on file  ?Social History Narrative  ? Not on file  ? ?Social Determinants of Health  ? ?Financial Resource Strain: Not on file  ?Food Insecurity: Not on file  ?Transportation Needs: Not on file  ?Physical Activity: Not on file  ?Stress: Not on file  ?Social Connections: Not on file  ?Intimate Partner Violence: Not on file  ? ? ?Family History:  ?Family History  ?Problem Relation Age of Onset  ? Kidney cancer Mother   ? Hypertension Father   ? ? ?Medications:   ?Current Outpatient Medications on File Prior to Visit  ?Medication Sig Dispense Refill  ? acetaminophen (TYLENOL) 500 MG tablet Take 500 mg by mouth daily as needed for mild pain or headache.    ? aspirin EC 81 MG EC tablet Take 1 tablet (81 mg total) by mouth daily. 90 tablet 0  ? clopidogrel (PLAVIX) 75 MG tablet Take 75 mg by mouth daily.    ? donepezil (ARICEPT) 5 MG tablet Take 5 mg by mouth daily.    ? escitalopram (LEXAPRO) 20 MG tablet Take 20 mg by mouth daily.    ? ezetimibe (ZETIA) 10 MG tablet Take 10 mg by mouth daily.    ? isosorbide mononitrate (IMDUR) 60 MG 24 hr tablet Take 1 tablet (60 mg total) by mouth every morning. do not give if systolic blood pressure < 130 30 tablet 0  ? [DISCONTINUED] simvastatin (ZOCOR) 20 MG tablet Take 20 mg by mouth every evening.    ? ?No current facility-administered medications on file prior to visit.  ? ? ?Allergies:   ?Allergies  ?Allergen Reactions  ? Livalo  [Pitavastatin] Other (See Comments)  ?  Cramping and back pain  ? Livalo [Pitavastatin] Other (See Comments)  ?  Cramping and back pain  ? Other Diarrhea and Other (See Comments)  ?  Unnamed patch and tablets for dementia caused diarrhea ?(Possibly galantamine, from outside source)   ? ? ? ? ?OBJECTIVE: ? ?Physical Exam ? ?Vitals:  ? 03/17/22 1303  ?BP: (!) 115/58  ?Pulse: (!) 59  ?Weight: 142 lb (64.4 kg)  ?Height: '5\' 7"'$  (1.702 m)  ? ?Body mass index is 22.24 kg/m?Marland Kitchen ?No results found. ? ?Post stroke PHQ 2/9 ? ?  03/17/2022  ?  2:45 PM  ?Depression screen  PHQ 2/9  ?Decreased Interest 0  ?Down, Depressed, Hopeless 0  ?PHQ - 2 Score 0  ?  ? ?General: well developed, well nourished, very pleasant elderly Caucasian male, seated, in no evident distress ?Head: head normocephalic and atraumatic.   ?Neck: supple with no carotid or supraclavicular bruits ?Cardiovascular: regular rate and rhythm, no murmurs ?Musculoskeletal: no deformity ?Skin:  no rash/petichiae ?Vascular:  Normal pulses all extremities ?  ?Neurologic Exam ?Mental Status: Awake and fully alert.  Fluent speech and language.  Oriented to place and time. Recent memory impaired and remote memory intact. Attention span, concentration and fund of knowledge appropriate with some hx provided by wife and son. Mood and affect appropriate.  ?Cranial Nerves: Fundoscopic exam reveals sharp disc margins. Pupils equal, briskly reactive to light. Extraocular movements full without nystagmus. Visual fields full to confrontation. Hearing intact. Facial sensation intact. Face, tongue, palate moves normally and symmetrically.  ?Motor: Normal bulk and tone. Normal strength in all tested extremity muscles ?Sensory.: intact to touch , pinprick , position and vibratory sensation.  ?Coordination: Rapid alternating movements normal in all extremities. Finger-to-nose and heel-to-shin performed accurately bilaterally. ?Gait and Station: deferred ?Reflexes: 1+ and symmetric. Toes  downgoing.  ? ? ? ?NIHSS  1 ?Modified Rankin  2-3 ? ? ? ? ? ?ASSESSMENT: SALBADOR FIVEASH is a 88 y.o. year old male with left lateral medullary small infarct on 01/30/2022 secondary to severe arthrosclerosis of posterior

## 2022-03-17 NOTE — Patient Instructions (Addendum)
Continue working with home health therapies for hopeful ongoing recovery  ? ?Order placed to complete a carotid ultrasound to ensure stable appearance of mild carotid artery stenosis (narrowing) ? ?Continue aspirin 81 mg daily and clopidogrel 75 mg daily until 5/17 then plavix alone and continue Zetia '10mg'$  daily  for secondary stroke prevention ? ?Continue to follow up with PCP regarding cholesterol and blood pressure management - please ensure you speak with your PCP/pharmacist about blood pressure recommendation goals - your current dose of Imdur (isosorbide monoitrate) may need to be adjusted  ?Maintain strict control of hypertension with blood pressure goal between 130-150 to ensure adequate perfusion and cholesterol with LDL cholesterol (bad cholesterol) goal below 70 mg/dL.  ? ?Signs of a Stroke? Follow the BEFAST method:  ?Balance Watch for a sudden loss of balance, trouble with coordination or vertigo ?Eyes Is there a sudden loss of vision in one or both eyes? Or double vision?  ?Face: Ask the person to smile. Does one side of the face droop or is it numb?  ?Arms: Ask the person to raise both arms. Does one arm drift downward? Is there weakness or numbness of a leg? ?Speech: Ask the person to repeat a simple phrase. Does the speech sound slurred/strange? Is the person confused ? ?Time: If you observe any of these signs, call 911. ? ? ? ? ?Followup in the future with me in 5 months or call earlier if needed ? ? ? ? ? ? ?Thank you for coming to see [place] at [facility]. I hope we have been able to provide you high quality care today. ? ?You may receive a patient satisfaction survey over the next few weeks. We would appreciate your feedback and comments so that we may continue to improve ourselves and the health of our patients. ? ?Stroke Prevention ?Some medical conditions and lifestyle choices can lead to a higher risk for a stroke. You can help to prevent a stroke by eating healthy foods and  exercising. It also helps to not smoke and to manage any health problems you may have. ?How can this condition affect me? ?A stroke is an emergency. It should be treated right away. A stroke can lead to brain damage or threaten your life. There is a better chance of surviving and getting better after a stroke if you get medical help right away. ?What can increase my risk? ?The following medical conditions may increase your risk of a stroke: ?Diseases of the heart and blood vessels (cardiovascular disease). ?High blood pressure (hypertension). ?Diabetes. ?High cholesterol. ?Sickle cell disease. ?Problems with blood clotting. ?Being very overweight. ?Sleeping problems (obstructivesleep apnea). ?Other risk factors include: ?Being older than age 70. ?A history of blood clots, stroke, or mini-stroke (TIA). ?Race, ethnic background, or a family history of stroke. ?Smoking or using tobacco products. ?Taking birth control pills, especially if you smoke. ?Heavy alcohol and drug use. ?Not being active. ?What actions can I take to prevent this? ?Manage your health conditions ?High cholesterol. ?Eat a healthy diet. If this is not enough to manage your cholesterol, you may need to take medicines. ?Take medicines as told by your doctor. ?High blood pressure. ?Try to keep your blood pressure below 130/80. ?If your blood pressure cannot be managed through a healthy diet and regular exercise, you may need to take medicines. ?Take medicines as told by your doctor. ?Ask your doctor if you should check your blood pressure at home. ?Have your blood pressure checked every year. ?Diabetes. ?Eat  a healthy diet and get regular exercise. If your blood sugar (glucose) cannot be managed through diet and exercise, you may need to take medicines. ?Take medicines as told by your doctor. ?Talk to your doctor about getting checked for sleeping problems. Signs of a problem can include: ?Snoring a lot. ?Feeling very tired. ?Make sure that you manage  any other conditions you have. ?Nutrition ? ?Follow instructions from your doctor about what to eat or drink. You may be told to: ?Eat and drink fewer calories each day. ?Limit how much salt (sodium) you use to 1,500 milligrams (mg) each day. ?Use only healthy fats for cooking, such as olive oil, canola oil, and sunflower oil. ?Eat healthy foods. To do this: ?Choose foods that are high in fiber. These include whole grains, and fresh fruits and vegetables. ?Eat at least 5 servings of fruits and vegetables a day. Try to fill one-half of your plate with fruits and vegetables at each meal. ?Choose low-fat (lean) proteins. These include low-fat cuts of meat, chicken without skin, fish, tofu, beans, and nuts. ?Eat low-fat dairy products. ?Avoid foods that: ?Are high in salt. ?Have saturated fat. ?Have trans fat. ?Have cholesterol. ?Are processed or pre-made. ?Count how many carbohydrates you eat and drink each day. ?Lifestyle ?If you drink alcohol: ?Limit how much you have to: ?0-1 drink a day for women who are not pregnant. ?0-2 drinks a day for men. ?Know how much alcohol is in your drink. In the [place] one drink equals one 12 oz bottle of beer (355m), one 5 oz glass of wine (1420m, or one 1? oz glass of hard liquor (4447m ?Do not smoke or use any products that have nicotine or tobacco. If you need help quitting, ask your doctor. ?Avoid secondhand smoke. ?Do not use drugs. ?Activity ? ?Try to stay at a healthy weight. ?Get at least 30 minutes of exercise on most days, such as: ?Fast walking. ?Biking. ?Swimming. ?Medicines ?Take over-the-counter and prescription medicines only as told by your doctor. ?Avoid taking birth control pills. Talk to your doctor about the risks of taking birth control pills if: ?You are over 35 64ars old. ?You smoke. ?You get very bad headaches. ?You have had a blood clot. ?Where to find more information ?American Stroke Association: www.strokeassociation.org ?Get help right away if: ?You or a  loved one has any signs of a stroke. "BE FAST" is an easy way to remember the warning signs: ?B - Balance. Dizziness, sudden trouble walking, or loss of balance. ?E - Eyes. Trouble seeing or a change in how you see. ?F - Face. Sudden weakness or loss of feeling of the face. The face or eyelid may droop on one side. ?A - Arms. Weakness or loss of feeling in an arm. This happens all of a sudden and most often on one side of the body. ?S - Speech. Sudden trouble speaking, slurred speech, or trouble understanding what people say. ?T - Time. Time to call emergency services. Write down what time symptoms started. ?You or a loved one has other signs of a stroke, such as: ?A sudden, very bad headache with no known cause. ?Feeling like you may vomit (nausea). ?Vomiting. ?A seizure. ?These symptoms may be an emergency. Get help right away. Call your local emergency services (911 in the [place] ?Do not wait to see if the symptoms will go away. ?Do not drive yourself to the hospital. ?Summary ?You can help to prevent a stroke by eating healthy, exercising, and not smoking. It  also helps to manage any health problems you have. ?Do not smoke or use any products that contain nicotine or tobacco. ?Get help right away if you or a loved one has any signs of a stroke. ?This information is not intended to replace advice given to you by your health care provider. Make sure you discuss any questions you have with your health care provider. ?Document Revised: 07/02/2020 Document Reviewed: 07/02/2020 ?Elsevier Patient Education ? [place]. ? ?

## 2022-03-20 ENCOUNTER — Telehealth: Payer: Self-pay | Admitting: Adult Health

## 2022-03-20 NOTE — Telephone Encounter (Signed)
HTA no Roxanna Mew, sent Butch Penny a message she will reach out to the patient to schedule.  ?

## 2022-03-26 ENCOUNTER — Ambulatory Visit (HOSPITAL_COMMUNITY)
Admission: RE | Admit: 2022-03-26 | Discharge: 2022-03-26 | Disposition: A | Discharge status: Home / Self Care | Payer: PPO | Source: Ambulatory Visit | Attending: Adult Health | Admitting: Adult Health

## 2022-03-26 DIAGNOSIS — I6523 Occlusion and stenosis of bilateral carotid arteries: Secondary | ICD-10-CM

## 2022-03-31 ENCOUNTER — Inpatient Hospital Stay: Payer: PPO | Admitting: Adult Health

## 2022-03-31 ENCOUNTER — Telehealth: Payer: Self-pay | Admitting: *Deleted

## 2022-03-31 NOTE — Telephone Encounter (Signed)
Prolonged discussion with both spouse and son at recent visit regarding etiology of patient's stroke with posterior circulation stenosis, treatment plan: aspirin Plavix for 3 months then single agent alone, statin therapy and avoiding hypotension (can see full details in recent OV note). We do not routinely monitor intracranial stenosis as there are typically no surgical interventions recommended he should experience reoccurring strokes or TIAs as there are greater risk with surgical interventions than medication management.  His imaging did note narrowing in his carotid arteries which can be treated surgically if indicated therefore monitoring of carotid stenosis is recommended.  ?

## 2022-03-31 NOTE — Telephone Encounter (Signed)
Called patient, reached daughter, on [place] I informed her  that his recent carotid ultrasound showed mild carotid narrowing at 1 to 39%.  No changes to current regimen indicated at this time. She stated they were told he has low posterior blood flow in back of head. Is there anything that can be done about it? She stated  that's where he had his stroke.  She also asked what happened to the blood vessel that caused his stroke?  Did a clot break off or was it temporarily  closed?  I advised will send her questions to NP. She  verbalized understanding, appreciation. ? ?

## 2022-03-31 NOTE — Telephone Encounter (Signed)
Called daughter and reviewed NP's message, answered her questions to her stated satisfaction. She  verbalized understanding, appreciation. ? ?

## 2022-04-04 DIAGNOSIS — R300 Dysuria: Secondary | ICD-10-CM | POA: Diagnosis not present

## 2022-04-04 DIAGNOSIS — R350 Frequency of micturition: Secondary | ICD-10-CM | POA: Diagnosis not present

## 2022-04-10 DIAGNOSIS — I639 Cerebral infarction, unspecified: Secondary | ICD-10-CM | POA: Diagnosis not present

## 2022-04-10 DIAGNOSIS — L8990 Pressure ulcer of unspecified site, unspecified stage: Secondary | ICD-10-CM | POA: Diagnosis not present

## 2022-04-10 DIAGNOSIS — F0390 Unspecified dementia without behavioral disturbance: Secondary | ICD-10-CM | POA: Diagnosis not present

## 2022-04-10 DIAGNOSIS — E785 Hyperlipidemia, unspecified: Secondary | ICD-10-CM | POA: Diagnosis not present

## 2022-04-28 DIAGNOSIS — L89212 Pressure ulcer of right hip, stage 2: Secondary | ICD-10-CM | POA: Diagnosis not present

## 2022-04-28 DIAGNOSIS — E538 Deficiency of other specified B group vitamins: Secondary | ICD-10-CM | POA: Diagnosis not present

## 2022-04-28 DIAGNOSIS — M1990 Unspecified osteoarthritis, unspecified site: Secondary | ICD-10-CM | POA: Diagnosis not present

## 2022-04-28 DIAGNOSIS — I6990 Unspecified sequelae of unspecified cerebrovascular disease: Secondary | ICD-10-CM | POA: Diagnosis not present

## 2022-04-28 DIAGNOSIS — E785 Hyperlipidemia, unspecified: Secondary | ICD-10-CM | POA: Diagnosis not present

## 2022-04-28 DIAGNOSIS — F0390 Unspecified dementia without behavioral disturbance: Secondary | ICD-10-CM | POA: Diagnosis not present

## 2022-04-28 DIAGNOSIS — I10 Essential (primary) hypertension: Secondary | ICD-10-CM | POA: Diagnosis not present

## 2022-04-28 DIAGNOSIS — R2681 Unsteadiness on feet: Secondary | ICD-10-CM | POA: Diagnosis not present

## 2022-04-28 DIAGNOSIS — C679 Malignant neoplasm of bladder, unspecified: Secondary | ICD-10-CM | POA: Diagnosis not present

## 2022-04-28 DIAGNOSIS — N401 Enlarged prostate with lower urinary tract symptoms: Secondary | ICD-10-CM | POA: Diagnosis not present

## 2022-04-28 DIAGNOSIS — R1310 Dysphagia, unspecified: Secondary | ICD-10-CM | POA: Diagnosis not present

## 2022-04-28 DIAGNOSIS — F329 Major depressive disorder, single episode, unspecified: Secondary | ICD-10-CM | POA: Diagnosis not present

## 2022-05-02 ENCOUNTER — Telehealth: Payer: Self-pay

## 2022-05-02 NOTE — Telephone Encounter (Signed)
Volunteer check in call for palliative care, no answer 

## 2022-05-10 DIAGNOSIS — F0390 Unspecified dementia without behavioral disturbance: Secondary | ICD-10-CM | POA: Diagnosis not present

## 2022-05-10 DIAGNOSIS — L8990 Pressure ulcer of unspecified site, unspecified stage: Secondary | ICD-10-CM | POA: Diagnosis not present

## 2022-05-10 DIAGNOSIS — E785 Hyperlipidemia, unspecified: Secondary | ICD-10-CM | POA: Diagnosis not present

## 2022-05-10 DIAGNOSIS — I639 Cerebral infarction, unspecified: Secondary | ICD-10-CM | POA: Diagnosis not present

## 2022-05-14 DIAGNOSIS — I10 Essential (primary) hypertension: Secondary | ICD-10-CM | POA: Diagnosis not present

## 2022-05-14 DIAGNOSIS — E785 Hyperlipidemia, unspecified: Secondary | ICD-10-CM | POA: Diagnosis not present

## 2022-05-14 DIAGNOSIS — F0390 Unspecified dementia without behavioral disturbance: Secondary | ICD-10-CM | POA: Diagnosis not present

## 2022-06-10 ENCOUNTER — Inpatient Hospital Stay (HOSPITAL_COMMUNITY)
Admission: EM | Admit: 2022-06-10 | Discharge: 2022-06-13 | DRG: 193 | Disposition: A | Discharge status: Home Health | Payer: PPO | Attending: Internal Medicine | Admitting: Internal Medicine

## 2022-06-10 ENCOUNTER — Emergency Department (HOSPITAL_COMMUNITY): Payer: PPO

## 2022-06-10 ENCOUNTER — Other Ambulatory Visit: Payer: Self-pay

## 2022-06-10 ENCOUNTER — Encounter (HOSPITAL_COMMUNITY): Payer: Self-pay | Admitting: Emergency Medicine

## 2022-06-10 DIAGNOSIS — J189 Pneumonia, unspecified organism: Secondary | ICD-10-CM

## 2022-06-10 DIAGNOSIS — A498 Other bacterial infections of unspecified site: Secondary | ICD-10-CM | POA: Insufficient documentation

## 2022-06-10 DIAGNOSIS — E8809 Other disorders of plasma-protein metabolism, not elsewhere classified: Secondary | ICD-10-CM | POA: Diagnosis not present

## 2022-06-10 DIAGNOSIS — Z7189 Other specified counseling: Secondary | ICD-10-CM | POA: Diagnosis not present

## 2022-06-10 DIAGNOSIS — J9601 Acute respiratory failure with hypoxia: Secondary | ICD-10-CM | POA: Diagnosis not present

## 2022-06-10 DIAGNOSIS — E785 Hyperlipidemia, unspecified: Secondary | ICD-10-CM | POA: Diagnosis not present

## 2022-06-10 DIAGNOSIS — E440 Moderate protein-calorie malnutrition: Secondary | ICD-10-CM | POA: Diagnosis present

## 2022-06-10 DIAGNOSIS — R509 Fever, unspecified: Secondary | ICD-10-CM | POA: Diagnosis not present

## 2022-06-10 DIAGNOSIS — I10 Essential (primary) hypertension: Secondary | ICD-10-CM | POA: Diagnosis not present

## 2022-06-10 DIAGNOSIS — R410 Disorientation, unspecified: Secondary | ICD-10-CM | POA: Diagnosis not present

## 2022-06-10 DIAGNOSIS — K769 Liver disease, unspecified: Secondary | ICD-10-CM | POA: Diagnosis not present

## 2022-06-10 DIAGNOSIS — F05 Delirium due to known physiological condition: Secondary | ICD-10-CM | POA: Diagnosis not present

## 2022-06-10 DIAGNOSIS — A419 Sepsis, unspecified organism: Secondary | ICD-10-CM | POA: Diagnosis not present

## 2022-06-10 DIAGNOSIS — D649 Anemia, unspecified: Secondary | ICD-10-CM | POA: Diagnosis not present

## 2022-06-10 DIAGNOSIS — J90 Pleural effusion, not elsewhere classified: Secondary | ICD-10-CM

## 2022-06-10 DIAGNOSIS — Z7982 Long term (current) use of aspirin: Secondary | ICD-10-CM

## 2022-06-10 DIAGNOSIS — D72828 Other elevated white blood cell count: Secondary | ICD-10-CM | POA: Diagnosis not present

## 2022-06-10 DIAGNOSIS — R0689 Other abnormalities of breathing: Secondary | ICD-10-CM | POA: Diagnosis not present

## 2022-06-10 DIAGNOSIS — Z8551 Personal history of malignant neoplasm of bladder: Secondary | ICD-10-CM

## 2022-06-10 DIAGNOSIS — F03918 Unspecified dementia, unspecified severity, with other behavioral disturbance: Secondary | ICD-10-CM | POA: Diagnosis present

## 2022-06-10 DIAGNOSIS — Z87891 Personal history of nicotine dependence: Secondary | ICD-10-CM

## 2022-06-10 DIAGNOSIS — Z9049 Acquired absence of other specified parts of digestive tract: Secondary | ICD-10-CM

## 2022-06-10 DIAGNOSIS — I639 Cerebral infarction, unspecified: Secondary | ICD-10-CM | POA: Diagnosis not present

## 2022-06-10 DIAGNOSIS — J869 Pyothorax without fistula: Secondary | ICD-10-CM | POA: Diagnosis present

## 2022-06-10 DIAGNOSIS — R1310 Dysphagia, unspecified: Secondary | ICD-10-CM | POA: Diagnosis not present

## 2022-06-10 DIAGNOSIS — I739 Peripheral vascular disease, unspecified: Secondary | ICD-10-CM | POA: Diagnosis present

## 2022-06-10 DIAGNOSIS — I5032 Chronic diastolic (congestive) heart failure: Secondary | ICD-10-CM | POA: Diagnosis present

## 2022-06-10 DIAGNOSIS — R627 Adult failure to thrive: Secondary | ICD-10-CM | POA: Diagnosis not present

## 2022-06-10 DIAGNOSIS — R1111 Vomiting without nausea: Secondary | ICD-10-CM | POA: Diagnosis not present

## 2022-06-10 DIAGNOSIS — I110 Hypertensive heart disease with heart failure: Secondary | ICD-10-CM | POA: Diagnosis not present

## 2022-06-10 DIAGNOSIS — Z79899 Other long term (current) drug therapy: Secondary | ICD-10-CM | POA: Diagnosis not present

## 2022-06-10 DIAGNOSIS — K5730 Diverticulosis of large intestine without perforation or abscess without bleeding: Secondary | ICD-10-CM | POA: Diagnosis not present

## 2022-06-10 DIAGNOSIS — Z515 Encounter for palliative care: Secondary | ICD-10-CM | POA: Diagnosis not present

## 2022-06-10 DIAGNOSIS — Z6820 Body mass index (BMI) 20.0-20.9, adult: Secondary | ICD-10-CM

## 2022-06-10 DIAGNOSIS — F0390 Unspecified dementia without behavioral disturbance: Secondary | ICD-10-CM | POA: Diagnosis not present

## 2022-06-10 DIAGNOSIS — Z20822 Contact with and (suspected) exposure to covid-19: Secondary | ICD-10-CM | POA: Diagnosis not present

## 2022-06-10 DIAGNOSIS — Z8249 Family history of ischemic heart disease and other diseases of the circulatory system: Secondary | ICD-10-CM

## 2022-06-10 DIAGNOSIS — Z66 Do not resuscitate: Secondary | ICD-10-CM | POA: Diagnosis not present

## 2022-06-10 DIAGNOSIS — L8990 Pressure ulcer of unspecified site, unspecified stage: Secondary | ICD-10-CM | POA: Diagnosis not present

## 2022-06-10 DIAGNOSIS — R0902 Hypoxemia: Secondary | ICD-10-CM | POA: Diagnosis not present

## 2022-06-10 DIAGNOSIS — Z8051 Family history of malignant neoplasm of kidney: Secondary | ICD-10-CM

## 2022-06-10 DIAGNOSIS — N400 Enlarged prostate without lower urinary tract symptoms: Secondary | ICD-10-CM | POA: Diagnosis present

## 2022-06-10 DIAGNOSIS — D72829 Elevated white blood cell count, unspecified: Secondary | ICD-10-CM | POA: Diagnosis not present

## 2022-06-10 DIAGNOSIS — Z7902 Long term (current) use of antithrombotics/antiplatelets: Secondary | ICD-10-CM | POA: Diagnosis not present

## 2022-06-10 DIAGNOSIS — Z8673 Personal history of transient ischemic attack (TIA), and cerebral infarction without residual deficits: Secondary | ICD-10-CM | POA: Diagnosis not present

## 2022-06-10 DIAGNOSIS — J168 Pneumonia due to other specified infectious organisms: Secondary | ICD-10-CM | POA: Diagnosis not present

## 2022-06-10 HISTORY — DX: Pneumonia, unspecified organism: J18.9

## 2022-06-10 HISTORY — DX: Other bacterial infections of unspecified site: A49.8

## 2022-06-10 LAB — CBC WITH DIFFERENTIAL/PLATELET
Abs Immature Granulocytes: 0.04 10*3/uL (ref 0.00–0.07)
Basophils Absolute: 0 10*3/uL (ref 0.0–0.1)
Basophils Relative: 0 %
Eosinophils Absolute: 0 10*3/uL (ref 0.0–0.5)
Eosinophils Relative: 0 %
HCT: 37.2 % — ABNORMAL LOW (ref 39.0–52.0)
Hemoglobin: 11.6 g/dL — ABNORMAL LOW (ref 13.0–17.0)
Immature Granulocytes: 0 %
Lymphocytes Relative: 6 %
Lymphs Abs: 0.6 10*3/uL — ABNORMAL LOW (ref 0.7–4.0)
MCH: 27.9 pg (ref 26.0–34.0)
MCHC: 31.2 g/dL (ref 30.0–36.0)
MCV: 89.4 fL (ref 80.0–100.0)
Monocytes Absolute: 0.6 10*3/uL (ref 0.1–1.0)
Monocytes Relative: 5 %
Neutro Abs: 9.6 10*3/uL — ABNORMAL HIGH (ref 1.7–7.7)
Neutrophils Relative %: 89 %
Platelets: 281 10*3/uL (ref 150–400)
RBC: 4.16 MIL/uL — ABNORMAL LOW (ref 4.22–5.81)
RDW: 14.5 % (ref 11.5–15.5)
WBC: 10.8 10*3/uL — ABNORMAL HIGH (ref 4.0–10.5)
nRBC: 0 % (ref 0.0–0.2)

## 2022-06-10 LAB — URINALYSIS, ROUTINE W REFLEX MICROSCOPIC
Bilirubin Urine: NEGATIVE
Glucose, UA: NEGATIVE mg/dL
Hgb urine dipstick: NEGATIVE
Ketones, ur: NEGATIVE mg/dL
Leukocytes,Ua: NEGATIVE
Nitrite: NEGATIVE
Protein, ur: NEGATIVE mg/dL
Specific Gravity, Urine: >1.046 — ABNORMAL HIGH (ref 1.005–1.030)
pH: 5 (ref 5.0–8.0)

## 2022-06-10 LAB — COMPREHENSIVE METABOLIC PANEL
ALT: 12 U/L (ref 0–44)
AST: 19 U/L (ref 15–41)
Albumin: 3.2 g/dL — ABNORMAL LOW (ref 3.5–5.0)
Alkaline Phosphatase: 65 U/L (ref 38–126)
Anion gap: 7 (ref 5–15)
BUN: 23 mg/dL (ref 8–23)
CO2: 24 mmol/L (ref 22–32)
Calcium: 8.5 mg/dL — ABNORMAL LOW (ref 8.9–10.3)
Chloride: 108 mmol/L (ref 98–111)
Creatinine, Ser: 1.1 mg/dL (ref 0.61–1.24)
GFR, Estimated: >60 mL/min (ref >60)
Glucose, Bld: 149 mg/dL — ABNORMAL HIGH (ref 70–99)
Potassium: 4.2 mmol/L (ref 3.5–5.1)
Sodium: 139 mmol/L (ref 135–145)
Total Bilirubin: 0.4 mg/dL (ref 0.3–1.2)
Total Protein: 5.8 g/dL — ABNORMAL LOW (ref 6.5–8.1)

## 2022-06-10 LAB — RESP PANEL BY RT-PCR (FLU A&B, COVID) ARPGX2
Influenza A by PCR: NEGATIVE
Influenza B by PCR: NEGATIVE
SARS Coronavirus 2 by RT PCR: NEGATIVE

## 2022-06-10 LAB — MRSA NEXT GEN BY PCR, NASAL: MRSA by PCR Next Gen: NOT DETECTED

## 2022-06-10 LAB — LACTIC ACID, PLASMA
Lactic Acid, Venous: 2.8 mmol/L (ref 0.5–1.9)
Lactic Acid, Venous: 1.6 mmol/L (ref 0.5–1.9)

## 2022-06-10 LAB — CBG MONITORING, ED: Glucose-Capillary: 132 mg/dL — ABNORMAL HIGH (ref 70–99)

## 2022-06-10 LAB — PROTIME-INR
INR: 1.1 (ref 0.8–1.2)
Prothrombin Time: 14.5 seconds (ref 11.4–15.2)

## 2022-06-10 LAB — BRAIN NATRIURETIC PEPTIDE: B Natriuretic Peptide: 167 pg/mL — ABNORMAL HIGH (ref 0.0–100.0)

## 2022-06-10 LAB — APTT: aPTT: 24 seconds (ref 24–36)

## 2022-06-10 LAB — LIPASE, BLOOD: Lipase: 34 U/L (ref 11–51)

## 2022-06-10 MED ORDER — ESCITALOPRAM OXALATE 20 MG PO TABS
20.0000 mg | ORAL_TABLET | Freq: Every day | ORAL | Status: DC
  Administered 2022-06-11 – 2022-06-13 (×3): 20 mg via ORAL
  Filled 2022-06-10 (×4): qty 1

## 2022-06-10 MED ORDER — SODIUM CHLORIDE 0.9 % IV SOLN
2.0000 g | Freq: Two times a day (BID) | INTRAVENOUS | Status: DC
  Administered 2022-06-10 – 2022-06-11 (×3): 2 g via INTRAVENOUS
  Filled 2022-06-10 (×3): qty 12.5

## 2022-06-10 MED ORDER — METRONIDAZOLE 500 MG/100ML IV SOLN
500.0000 mg | Freq: Two times a day (BID) | INTRAVENOUS | Status: DC
  Administered 2022-06-10 – 2022-06-12 (×4): 500 mg via INTRAVENOUS
  Filled 2022-06-10 (×4): qty 100

## 2022-06-10 MED ORDER — SODIUM CHLORIDE 0.9% FLUSH
3.0000 mL | Freq: Two times a day (BID) | INTRAVENOUS | Status: DC
  Administered 2022-06-10 – 2022-06-12 (×5): 3 mL via INTRAVENOUS

## 2022-06-10 MED ORDER — VANCOMYCIN HCL IN DEXTROSE 1-5 GM/200ML-% IV SOLN: 1000.0000 mg | Freq: Once | INTRAVENOUS | Status: DC

## 2022-06-10 MED ORDER — SODIUM CHLORIDE 0.9 % IV SOLN
2.0000 g | Freq: Once | INTRAVENOUS | Status: AC
  Administered 2022-06-10: 2 g via INTRAVENOUS
  Filled 2022-06-10: qty 12.5

## 2022-06-10 MED ORDER — PROCHLORPERAZINE EDISYLATE 10 MG/2ML IJ SOLN
5.0000 mg | Freq: Once | INTRAMUSCULAR | Status: AC
  Administered 2022-06-10: 5 mg via INTRAVENOUS
  Filled 2022-06-10: qty 2

## 2022-06-10 MED ORDER — LACTATED RINGERS IV BOLUS
500.0000 mL | Freq: Once | INTRAVENOUS | Status: AC
  Administered 2022-06-10: 500 mL via INTRAVENOUS

## 2022-06-10 MED ORDER — IOHEXOL 300 MG/ML  SOLN
80.0000 mL | Freq: Once | INTRAMUSCULAR | Status: AC | PRN
  Administered 2022-06-10: 80 mL via INTRAVENOUS

## 2022-06-10 MED ORDER — VANCOMYCIN HCL 1250 MG/250ML IV SOLN
1250.0000 mg | Freq: Once | INTRAVENOUS | Status: AC
  Administered 2022-06-10: 1250 mg via INTRAVENOUS
  Filled 2022-06-10: qty 250

## 2022-06-10 MED ORDER — LACTATED RINGERS IV SOLN: INTRAVENOUS | Status: AC

## 2022-06-10 MED ORDER — HALOPERIDOL LACTATE 5 MG/ML IJ SOLN
2.0000 mg | Freq: Four times a day (QID) | INTRAMUSCULAR | Status: DC | PRN
  Administered 2022-06-11 – 2022-06-12 (×2): 2 mg via INTRAVENOUS
  Filled 2022-06-10 (×2): qty 1

## 2022-06-10 MED ORDER — VANCOMYCIN HCL 750 MG/150ML IV SOLN
750.0000 mg | INTRAVENOUS | Status: DC
  Administered 2022-06-11: 750 mg via INTRAVENOUS
  Filled 2022-06-10 (×2): qty 150

## 2022-06-10 MED ORDER — LACTATED RINGERS IV SOLN: INTRAVENOUS | Status: DC

## 2022-06-10 MED ORDER — ENOXAPARIN SODIUM 40 MG/0.4ML IJ SOSY
40.0000 mg | PREFILLED_SYRINGE | INTRAMUSCULAR | Status: DC
  Administered 2022-06-10 – 2022-06-12 (×2): 40 mg via SUBCUTANEOUS
  Filled 2022-06-10 (×3): qty 0.4

## 2022-06-10 MED ORDER — ACETAMINOPHEN 325 MG PO TABS: 650.0000 mg | ORAL_TABLET | Freq: Four times a day (QID) | ORAL | Status: DC | PRN

## 2022-06-10 MED ORDER — GUAIFENESIN ER 600 MG PO TB12
600.0000 mg | ORAL_TABLET | Freq: Two times a day (BID) | ORAL | Status: DC
  Administered 2022-06-10 – 2022-06-13 (×5): 600 mg via ORAL
  Filled 2022-06-10 (×7): qty 1

## 2022-06-10 MED ORDER — DONEPEZIL HCL 5 MG PO TABS
5.0000 mg | ORAL_TABLET | Freq: Every day | ORAL | Status: DC
  Administered 2022-06-11 – 2022-06-13 (×3): 5 mg via ORAL
  Filled 2022-06-10 (×5): qty 1

## 2022-06-10 MED ORDER — ACETAMINOPHEN 650 MG RE SUPP: 650.0000 mg | Freq: Four times a day (QID) | RECTAL | Status: DC | PRN

## 2022-06-10 MED ORDER — METRONIDAZOLE 500 MG/100ML IV SOLN
500.0000 mg | Freq: Once | INTRAVENOUS | Status: AC
  Administered 2022-06-10: 500 mg via INTRAVENOUS
  Filled 2022-06-10: qty 100

## 2022-06-10 MED ORDER — ACETAMINOPHEN 650 MG RE SUPP
650.0000 mg | Freq: Once | RECTAL | Status: AC
  Administered 2022-06-10: 650 mg via RECTAL
  Filled 2022-06-10: qty 1

## 2022-06-10 MED ORDER — ALBUTEROL SULFATE (2.5 MG/3ML) 0.083% IN NEBU: 2.5000 mg | INHALATION_SOLUTION | Freq: Four times a day (QID) | RESPIRATORY_TRACT | Status: DC | PRN

## 2022-06-10 MED ORDER — CLOPIDOGREL BISULFATE 75 MG PO TABS
75.0000 mg | ORAL_TABLET | Freq: Every day | ORAL | Status: DC
  Administered 2022-06-11 – 2022-06-13 (×3): 75 mg via ORAL
  Filled 2022-06-10 (×4): qty 1

## 2022-06-10 NOTE — ED Triage Notes (Signed)
Patient from home, went to bed at 2200 last night.  Woke up with vomiting and diarrhea.  Patient is very hot to the touch, tachycardic.  Patient has vomited with EMS about 8 times, was given 4mg  Zofran.  Patient still with nausea upon movement.

## 2022-06-10 NOTE — ED Provider Notes (Signed)
[facility] EMERGENCY DEPARTMENT Provider Note   CSN: [id number] Arrival date & time: 06/10/22  0304     History  Chief Complaint  Patient presents with   Fever   Nausea   Emesis    Paul [name] is a 88 y.o. male with a history of hypertension, hyperlipidemia, prior stroke, PAD, dementia, and prior appendectomy who presents to the emergency department via EMS for evaluation of vomiting that began just prior to arrival tonight.  History primarily provided by EMS as patient has a history of dementia, level 5 caveat applies.  Per EMS patient lives at home, family states that he went to bed normal, he woke up shortly prior to calling EMS with multiple episodes of emesis.  He has vomited 8 times with the EMS staff, they have given him 700 cc of lactated Ringer's and 4 mg of IV Zofran, he continues to gag.  They report he was hypoxic into the upper 80s on room air therefore they applied 4 L via nasal cannula.  Family is in route.  Patient reports he woke up not feeling well with vomiting, he denies pain, he is unsure regarding his last bowel movement.  HPI     Home Medications Prior to Admission medications   Medication Sig Start Date End Date Taking? Authorizing Provider  acetaminophen (TYLENOL) 500 MG tablet Take 500 mg by mouth daily as needed for mild pain or headache.    [provider]  aspirin EC 81 MG EC tablet Take 1 tablet (81 mg total) by mouth daily. 02/01/22   Patrecia Pour, MD  clopidogrel (PLAVIX) 75 MG tablet Take 75 mg by mouth daily. 01/27/20   [provider]  donepezil (ARICEPT) 5 MG tablet Take 5 mg by mouth daily.    [provider]  escitalopram (LEXAPRO) 20 MG tablet Take 20 mg by mouth daily. 01/24/20   [provider]  ezetimibe (ZETIA) 10 MG tablet Take 10 mg by mouth daily.    [provider]  isosorbide mononitrate (IMDUR) 60 MG 24 hr tablet Take 1 tablet (60 mg total) by mouth every morning. do not  give if systolic blood pressure < 130 02/01/22   Patrecia Pour, MD  simvastatin (ZOCOR) 20 MG tablet Take 20 mg by mouth every evening.  02/27/12  [provider]      Allergies    Livalo [pitavastatin], Livalo [pitavastatin], and Other    Review of Systems   Review of Systems  Unable to perform ROS: Dementia  Cardiovascular:  Negative for chest pain.  Gastrointestinal:  Positive for vomiting. Negative for abdominal pain.    Physical Exam Updated Vital Signs BP (!) 157/68   Pulse 88   Temp (!) 102.7 F (39.3 C) (Rectal)   Resp 20   SpO2 91%  Physical Exam Vitals and nursing note reviewed.  Constitutional:      Appearance: He is ill-appearing. He is not toxic-appearing.  HENT:     Head: Normocephalic and atraumatic.     Mouth/Throat:     Mouth: Mucous membranes are dry.  Eyes:     Pupils: Pupils are equal, round, and reactive to light.  Cardiovascular:     Rate and Rhythm: Normal rate and regular rhythm.  Pulmonary:     Comments: Decreased breath sounds '@the'$  bases left greater than right . SPO2 89% on room air, 2 L via nasal cannula applied. Abdominal:     General: There is no distension.  Palpations: Abdomen is soft.     Tenderness: There is abdominal tenderness (LLQ). There is no guarding or rebound.  Genitourinary:    Comments: Chaperone present.  Brown soft stool present around the rectum, no melena or bright red blood.  Rectal temp pain 102.7. Musculoskeletal:     Cervical back: Neck supple. No rigidity.     Right lower leg: No edema.     Left lower leg: No edema.  Skin:    General: Skin is warm and dry.     Comments: Hot to the touch.  Neurological:     Mental Status: He is alert.  Psychiatric:     Comments: Cooperative.     ED Results / Procedures / Treatments   Labs (all labs ordered are listed, but only abnormal results are displayed) Labs Reviewed  LACTIC ACID, PLASMA - Abnormal; Notable for the following components:      Result Value    Lactic Acid, Venous 2.8 (*)    All other components within normal limits  COMPREHENSIVE METABOLIC PANEL - Abnormal; Notable for the following components:   Glucose, Bld 149 (*)    Calcium 8.5 (*)    Total Protein 5.8 (*)    Albumin 3.2 (*)    All other components within normal limits  CBC WITH DIFFERENTIAL/PLATELET - Abnormal; Notable for the following components:   WBC 10.8 (*)    RBC 4.16 (*)    Hemoglobin 11.6 (*)    HCT 37.2 (*)    Neutro Abs 9.6 (*)    Lymphs Abs 0.6 (*)    All other components within normal limits  CBG MONITORING, ED - Abnormal; Notable for the following components:   Glucose-Capillary 132 (*)    All other components within normal limits  CULTURE, BLOOD (ROUTINE X 2)  CULTURE, BLOOD (ROUTINE X 2)  URINE CULTURE  RESP PANEL BY RT-PCR (FLU A&B, COVID) ARPGX2  LIPASE, BLOOD  LACTIC ACID, PLASMA  PROTIME-INR  APTT  URINALYSIS, ROUTINE W REFLEX MICROSCOPIC    EKG EKG Interpretation  Date/Time:  Tuesday June 10 2022 03:14:00 EDT Ventricular Rate:  90 PR Interval:  175 QRS Duration: 91 QT Interval:  347 QTC Calculation: 425 R Axis:   -1 Text Interpretation: Sinus rhythm Anteroseptal infarct, old Confirmed by Thayer Jew [phone]) on 06/10/2022 3:33:22 AM  Radiology CT Abdomen Pelvis W Contrast  Result Date: 06/10/2022 CLINICAL DATA:  88 year old male with history of left lower quadrant abdominal pain, vomiting and diarrhea. EXAM: CT ABDOMEN AND PELVIS WITH CONTRAST TECHNIQUE: Multidetector CT imaging of the abdomen and pelvis was performed using the standard protocol following bolus administration of intravenous contrast. RADIATION DOSE REDUCTION: This exam was performed according to the departmental dose-optimization program which includes automated exposure control, adjustment of the mA and/or kV according to patient size and/or use of iterative reconstruction technique. CONTRAST:  46m OMNIPAQUE IOHEXOL 300 MG/ML  SOLN COMPARISON:  CT the abdomen and  pelvis 04/24/2020. FINDINGS: Comment: Today's study is limited by considerable patient respiratory motion. Lower chest: Incompletely imaged complex left pleural effusion with thickening and enhancement of the pleura, concerning for potential empyema. Mass-like opacity in the left lower lobe incompletely imaged. Patchy airspace consolidation in the dependent portion of the right lower lobe. Atherosclerotic calcifications in the descending thoracic aorta as well as the right coronary artery. Hepatobiliary: Subcentimeter low-attenuation lesions in the liver are too small to definitively characterize, but similar to the prior examination and statistically likely to represent cysts. No other larger more suspicious  appearing hepatic lesions are noted. No intra or extrahepatic biliary ductal dilatation. Gallbladder is unremarkable in appearance. Pancreas: No pancreatic mass. No pancreatic ductal dilatation. No pancreatic or peripancreatic fluid collections or inflammatory changes. Spleen: Splenule inferior to the spleen, otherwise unremarkable. Adrenals/Urinary Tract: Moderate atrophy of the right kidney, similar to the prior examination. Left kidney and bilateral adrenal glands are otherwise unremarkable in appearance. No hydroureteronephrosis. Urinary bladder is normal in appearance. Stomach/Bowel: The appearance of the stomach is normal. No pathologic dilatation of small bowel or colon. Numerous colonic diverticulae are noted, particularly in the sigmoid colon, without definite focal surrounding inflammatory changes to indicate an acute diverticulitis at this time. The appendix is not confidently identified and may be surgically absent. Regardless, there are no inflammatory changes noted adjacent to the cecum to suggest the presence of an acute appendicitis at this time. Vascular/Lymphatic: Aortic atherosclerosis, without evidence of aneurysm or dissection in the abdominal or pelvic vasculature. No lymphadenopathy noted  in the abdomen or pelvis. Reproductive: Prostate gland is enlarged and heterogeneous in appearance with median lobe hypertrophy. Seminal vesicles are unremarkable in appearance. Other: No significant volume of ascites.  No pneumoperitoneum. Musculoskeletal: There are no aggressive appearing lytic or blastic lesions noted in the visualized portions of the skeleton. IMPRESSION: 1. Extensive colonic diverticulosis, particularly in the sigmoid colon. No definitive inflammatory changes confidently identified to clearly indicate an acute diverticulitis at this time. 2. Right lower lobe pneumonia. 3. Enlarging thick-walled enhancing complex left pleural effusion concerning for probable empyema, with a mass-like opacity in the left lower lobe. This could represent an area of rounded atelectasis, however, underlying neoplasm is not entirely excluded. Further clinical evaluation is recommended, in addition sampling of the pleural fluid if clinically appropriate. 4. Aortic atherosclerosis, in addition to at least right coronary artery disease. 5. Additional incidental findings, as above. Electronically Signed   By: Vinnie Langton M.D.   On: 06/10/2022 05:49   DG Chest Port 1 View  Result Date: 06/10/2022 CLINICAL DATA:  Sepsis EXAM: PORTABLE CHEST 1 VIEW COMPARISON:  01/30/2022 FINDINGS: Small, laterally loculated chronic left pleural effusion is unchanged with associated left basilar atelectasis or infiltrate contributing to retrocardiac opacification. Right lung is clear. No pneumothorax or pleural effusion on the right. Cardiac size within normal limits. Pulmonary vascularity is normal. IMPRESSION: Stable chronic left pleural effusion with associated left basilar atelectasis or infiltrate. Electronically Signed   By: Fidela Salisbury M.D.   On: 06/10/2022 03:54    Procedures .Critical Care  Performed by: Amaryllis Dyke, PA-C Authorized by: Amaryllis Dyke, PA-C     CRITICAL CARE Performed by:  Kennith Maes   Total critical care time: 30 minutes  Critical care time was exclusive of separately billable procedures and treating other patients.  Critical care was necessary to treat or prevent imminent or life-threatening deterioration.  Critical care was time spent personally by me on the following activities: development of treatment plan with patient and/or surrogate as well as nursing, discussions with consultants, evaluation of patient's response to treatment, examination of patient, obtaining history from patient or surrogate, ordering and performing treatments and interventions, ordering and review of laboratory studies, ordering and review of radiographic studies, pulse oximetry and re-evaluation of patient's condition.    Medications Ordered in ED Medications  prochlorperazine (COMPAZINE) injection 5 mg (has no administration in time range)    ED Course/ Medical Decision Making/ A&P  Medical Decision Making Amount and/or Complexity of Data Reviewed Labs: ordered. Radiology: ordered. ECG/medicine tests: ordered.  Risk OTC drugs. Prescription drug management. Decision regarding hospitalization.  Patient presents to the ED with complaints of nausea and vomiting tonight, this involves an extensive number of treatment options, and is a complaint that carries with it a high risk of complications and morbidity.  Patient ill-appearing, febrile to 102.7 rectally, and hypoxic requiring 2 L via nasal cannula.  Decreased bibasilar breath, left greater than right.  Left lower quadrant abdominal tenderness without peritoneal signs.  Code sepsis activated, broad-spectrum antibiotics initiated, received 700 cc of fluid in route by EMS, will give a small fluid bolus of 500 cc, avoiding aggressive hydration in elderly patient with appropriate blood pressure.  Given continued vomiting I have ordered Compazine.   Additional history obtained:   Chart/nursing notes reviewed, discussion with EMS.Marland Kitchen  External records viewed including: Echo from 01/2022: 1. Left ventricular ejection fraction, by estimation, is 60 to 65%. The  left ventricle has normal function. The left ventricle has no regional  wall motion abnormalities. There is mild left ventricular hypertrophy.  Left ventricular diastolic parameters  are consistent with Grade I diastolic dysfunction (impaired relaxation  EKG: Sinus rhythm Anteroseptal infarct, old   Lab Tests:  I viewed & interpreted labs including:  CBC: Mild leukocytosis at 10.8, anemia similar to prior CMP: Mild hypoalbuminemia, no critical electrolyte derangement. Lipase: Within normal limits APTT/PT/INR: Within normal notes Lactic acid: Elevated at 2.8  Imaging Studies:  I ordered and viewed the following imaging, agree with radiologist impression:  CXR: Stable chronic left pleural effusion with associated left basilar atelectasis or infiltrate CT A/P: 1. Extensive colonic diverticulosis, particularly in the sigmoid colon. No definitive inflammatory changes confidently identified to clearly indicate an acute diverticulitis at this time. 2. Right lower lobe pneumonia. 3. Enlarging thick-walled enhancing complex left pleural effusion concerning for probable empyema, with a mass-like opacity in the left lower lobe. This could represent an area of rounded atelectasis, however, underlying neoplasm is not entirely excluded. Further clinical evaluation is recommended, in addition sampling of the pleural fluid if clinically appropriate. 4. Aortic atherosclerosis, in addition to at least right coronary artery disease. 5. Additional incidental findings, as above.  ED Course:  Patient with right lower lobe pneumonia, given clinical history concern for aspiration pneumonia, also has complex left pleural effusion which may be contributing to his overall presentation.  He remains on 2 L via nasal cannula.  Will discuss with  medicine for admission.  I discussed results and treatment plan with the patient and his daughter at bedside, provided opportunity for questions, they have confirmed understanding and are in agreement.  06:22: CONSULT: Discussed with hospitalist Dr. Myna Hidalgo- accepts admission.   Discussed w/ attending.   Portions of this note were generated with Lobby[employer]. Dictation errors may occur despite best attempts at proofreading.   Final Clinical Impression(s) / ED Diagnoses Final diagnoses:  Pneumonia of right lower lobe due to infectious organism  Acute hypoxemic respiratory failure ([place])  Pleural effusion    Rx / DC Orders ED Discharge Orders     None         Amaryllis Dyke, PA-C 06/10/22 0656    Merryl Hacker, MD 06/11/22 0021

## 2022-06-10 NOTE — Progress Notes (Signed)
Pharmacy Antibiotic Note  Paul [name] is a 88 y.o. male admitted on 06/10/2022 with pneumonia.  Pharmacy has been consulted for vancomycin and cefepime dosing. Pt is febrile with Tmax 102.7, WBC 10.8. SCr is WNL. Lactic acid is down to 1.6.   Plan: Cefepime 2gm IV Q12H Vanc 750mg  IV Q24H F/u renal fxn, C&S, clinical status and peak/trough at SS  Height: 5\' 7"  (170.2 cm) Weight: 59 kg (130 lb) IBW/kg (Calculated) : 66.1  Temp (24hrs), Avg:100.7 F (38.2 C), Min:98.7 F (37.1 C), Max:102.7 F (39.3 C)  Recent Labs  Lab 06/10/22 0330 06/10/22 0550  WBC 10.8*  --   CREATININE 1.10  --   LATICACIDVEN 2.8* 1.6    Estimated Creatinine Clearance: 37.2 mL/min (by C-G formula based on SCr of 1.1 mg/dL).    Allergies  Allergen Reactions   Livalo [Pitavastatin] Other (See Comments)    Cramping and back pain   Livalo [Pitavastatin] Other (See Comments)    Cramping and back pain   Other Diarrhea and Other (See Comments)    Unnamed patch and tablets for dementia caused diarrhea (Possibly galantamine, from outside source)     Antimicrobials this admission: Vanc 6/27>> Cefepime 6/27>> Flagyl 6/27>>  Dose adjustments this admission: N/A  Microbiology results: Pending  Thank you for allowing pharmacy to be a part of this patient's care.  Shaketha Jeon, Drake Leach 06/10/2022 11:20 AM

## 2022-06-10 NOTE — Sepsis Progress Note (Signed)
Following per sepsis protocol   

## 2022-06-10 NOTE — ED Notes (Signed)
Attempted to wean 02 without success. 02 sats 90-91% on RA. 02 back on @ 2LPM via [place]. Sats up to to 96%

## 2022-06-11 ENCOUNTER — Encounter (HOSPITAL_COMMUNITY): Payer: Self-pay | Admitting: Family Medicine

## 2022-06-11 ENCOUNTER — Inpatient Hospital Stay (HOSPITAL_COMMUNITY): Payer: PPO

## 2022-06-11 DIAGNOSIS — R1310 Dysphagia, unspecified: Secondary | ICD-10-CM | POA: Diagnosis not present

## 2022-06-11 DIAGNOSIS — R509 Fever, unspecified: Secondary | ICD-10-CM | POA: Diagnosis not present

## 2022-06-11 DIAGNOSIS — Z515 Encounter for palliative care: Secondary | ICD-10-CM

## 2022-06-11 DIAGNOSIS — F03918 Unspecified dementia, unspecified severity, with other behavioral disturbance: Secondary | ICD-10-CM

## 2022-06-11 DIAGNOSIS — Z7189 Other specified counseling: Secondary | ICD-10-CM | POA: Diagnosis not present

## 2022-06-11 DIAGNOSIS — J9601 Acute respiratory failure with hypoxia: Secondary | ICD-10-CM | POA: Diagnosis not present

## 2022-06-11 DIAGNOSIS — J189 Pneumonia, unspecified organism: Secondary | ICD-10-CM | POA: Diagnosis not present

## 2022-06-11 LAB — COMPREHENSIVE METABOLIC PANEL
ALT: 11 U/L (ref 0–44)
AST: 15 U/L (ref 15–41)
Albumin: 2.7 g/dL — ABNORMAL LOW (ref 3.5–5.0)
Alkaline Phosphatase: 50 U/L (ref 38–126)
Anion gap: 7 (ref 5–15)
BUN: 18 mg/dL (ref 8–23)
CO2: 25 mmol/L (ref 22–32)
Calcium: 8.4 mg/dL — ABNORMAL LOW (ref 8.9–10.3)
Chloride: 107 mmol/L (ref 98–111)
Creatinine, Ser: 1.01 mg/dL (ref 0.61–1.24)
GFR, Estimated: >60 mL/min (ref >60)
Glucose, Bld: 78 mg/dL (ref 70–99)
Potassium: 4.1 mmol/L (ref 3.5–5.1)
Sodium: 139 mmol/L (ref 135–145)
Total Bilirubin: 0.7 mg/dL (ref 0.3–1.2)
Total Protein: 5.2 g/dL — ABNORMAL LOW (ref 6.5–8.1)

## 2022-06-11 LAB — CBC
HCT: 33 % — ABNORMAL LOW (ref 39.0–52.0)
Hemoglobin: 10.4 g/dL — ABNORMAL LOW (ref 13.0–17.0)
MCH: 28.1 pg (ref 26.0–34.0)
MCHC: 31.5 g/dL (ref 30.0–36.0)
MCV: 89.2 fL (ref 80.0–100.0)
Platelets: 233 10*3/uL (ref 150–400)
RBC: 3.7 MIL/uL — ABNORMAL LOW (ref 4.22–5.81)
RDW: 14.5 % (ref 11.5–15.5)
WBC: 9.4 10*3/uL (ref 4.0–10.5)
nRBC: 0 % (ref 0.0–0.2)

## 2022-06-11 LAB — URINE CULTURE: Culture: NO GROWTH

## 2022-06-11 LAB — LACTATE DEHYDROGENASE: LDH: 154 U/L (ref 98–192)

## 2022-06-11 MED ORDER — LIDOCAINE HCL 1 % IJ SOLN
INTRAMUSCULAR | Status: AC
  Filled 2022-06-11: qty 20

## 2022-06-11 MED ORDER — OLANZAPINE 10 MG IM SOLR
5.0000 mg | Freq: Once | INTRAMUSCULAR | Status: DC
  Filled 2022-06-11: qty 10

## 2022-06-11 MED ORDER — HALOPERIDOL LACTATE 5 MG/ML IJ SOLN
5.0000 mg | Freq: Once | INTRAMUSCULAR | Status: AC
  Administered 2022-06-11: 5 mg via INTRAVENOUS
  Filled 2022-06-11: qty 1

## 2022-06-11 MED ORDER — QUETIAPINE FUMARATE 25 MG PO TABS: 25.0000 mg | ORAL_TABLET | Freq: Every day | ORAL | Status: DC

## 2022-06-11 MED ORDER — QUETIAPINE FUMARATE 25 MG PO TABS
25.0000 mg | ORAL_TABLET | Freq: Every day | ORAL | Status: DC
Start: 2022-06-11 — End: 2022-06-12
  Administered 2022-06-11: 25 mg via ORAL
  Filled 2022-06-11: qty 1

## 2022-06-11 MED ORDER — ZIPRASIDONE MESYLATE 20 MG IM SOLR
10.0000 mg | Freq: Once | INTRAMUSCULAR | Status: DC
Start: 2022-06-11 — End: 2022-06-13
  Filled 2022-06-11: qty 20

## 2022-06-11 MED ORDER — ADULT MULTIVITAMIN W/MINERALS CH
1.0000 | ORAL_TABLET | Freq: Every day | ORAL | Status: DC
  Administered 2022-06-11: 1 via ORAL
  Filled 2022-06-11 (×3): qty 1

## 2022-06-11 MED ORDER — ENSURE ENLIVE PO LIQD
237.0000 mL | Freq: Two times a day (BID) | ORAL | Status: DC
  Administered 2022-06-12 – 2022-06-13 (×2): 237 mL via ORAL

## 2022-06-11 NOTE — Progress Notes (Signed)
  Transition of Care [facility]) Screening Note   Patient Details  Name: Paul [name] Date of Birth: 07-29-1930   [facility] CM/SW Contact:    Cyndi Bender, RN Phone Number: 06/11/2022, 8:40 AM    Transition of Care Department [facility]) has reviewed patient and no TOC needs have been identified at this time. We will continue to monitor patient advancement through interdisciplinary progression rounds. If new patient transition needs arise, please place a TOC consult.

## 2022-06-11 NOTE — Progress Notes (Signed)
Modified Barium Swallow Progress Note  Patient Details  Name: Paul [name] MRN: [id number] Date of Birth: [birth year]  Today's Date: 06/11/2022  Modified Barium Swallow completed.  Full report located under Chart Review in the Imaging Section.  Brief recommendations include the following:  Clinical Impression  Patient presents with a mild oropharyngeal phase dysphagia and primary structural dysphagia secondary to cervical osteophytes as well as prominent cricopharyngeal bar (both noted on MBS 2019). During today's study, patient exhibited swallow initiation delay with thin liquids to level of pyriform sinus and with puree solids, nectar thick liquids, mechanical soft solids, delay was to level of vallecular sinus. Only trace to mild vallecular residuals observed with tested boluses with full clearance with subsequent swallows. As compared to previous study, patient now exhibiting consistent collection of all consistencies of barium in suspected diverticulum above cricopharyngeal bar. Barium in this outpouching would clear but never fully, however SLP did not observe any increase in amount of barium collected. Chin tuck posture did not help to clear barium. No penetration or aspiration occured with any of the tested consistencies. SLP is recommending continue with Dys 3 solids, thin liquids. SLP provided daughter with some recommendations for swallow safety and will complete education prior to discharge of patient from SLP services.   Swallow Evaluation Recommendations       SLP Diet Recommendations: Dysphagia 3 (Mech soft) solids;Thin liquid   Liquid Administration via: Cup;Straw   Medication Administration: Whole meds with puree   Supervision: Patient able to self feed   Compensations: Slow rate;Small sips/bites;Minimize environmental distractions   Postural Changes: Seated upright at 90 degrees;Remain semi-upright after after feeds/meals (Comment) (30 minutes)           Sonia Baller, MA, CCC-SLP Speech Therapy

## 2022-06-11 NOTE — Consult Note (Signed)
Consultation Note Date: 06/11/2022   Patient Name: Paul [name]  DOB: [birth year]  MRN: [id number]  Age / Sex: 88 y.o., male  PCP: Crist Infante, MD Referring Physician: Jennye Boroughs, MD  Reason for Consultation: Establishing goals of care and Psychosocial/spiritual support  HPI/Patient Profile: 88 y.o. male  with past medical history of *** admitted on 06/10/2022 with ***.       Clinical Assessment and Goals of Care: *** This NP Wadie Lessen reviewed medical records, received report from team, assessed the patient and then meet at the patient's bedside  to discuss diagnosis, prognosis, GOC, EOL wishes disposition and options.   Concept of Palliative Care was introduced as specialized medical care for people and their families living with serious illness.  If focuses on providing relief from the symptoms and stress of a serious illness.  The goal is to improve quality of life for both the patient and the family.  Created space and opportunity for patient  and family to explore thoughts and feelings regarding current medical situation     A  discussion was had today regarding advanced directives.  Concepts specific to code status, artifical feeding and hydration, continued IV antibiotics and rehospitalization was had.  The difference between a aggressive medical intervention path  and a palliative comfort care path for this patient at this time was had.  Values and goals of care important to patient and family were attempted to be elicited.   MOST form    Natural trajectory and expectations at EOL were discussed.  Questions and concerns addressed.  Patient  encouraged to call with questions or concerns.     PMT will continue to support holistically.              {Primary Decision KXFGH:82993}    SUMMARY OF RECOMMENDATIONS   *** Code Status/Advance Care Planning: {Palliative Code  status:23503}   Symptom Management:  ***  Palliative Prophylaxis:  {Palliative Prophylaxis:21015}  Additional Recommendations (Limitations, Scope, Preferences): {Recommended Scope and Preferences:21019}  Psycho-social/Spiritual:  Desire for further Chaplaincy support:{YES NO:22349} Additional Recommendations: {PAL SOCIAL:21064}  Prognosis:  {Palliative Care Prognosis:23504}  Discharge Planning: {Palliative dispostion:23505}      Primary Diagnoses: Present on Admission: . Pneumonia . Loculated pleural effusion . Fever . Leukocytosis . Dementia with behavioral disturbance ([place]) . Acute respiratory failure with hypoxia ([place])   I have reviewed the medical record, interviewed the patient and family, and examined the patient. The following aspects are pertinent.  Past Medical History:  Diagnosis Date  . Back pain   . Bladder tumor   . BPH (benign prostatic hyperplasia)   . Hyperlipidemia   . Hypertension   . Impaired hearing BILATERAL HEARING AIDS  . Personal history of gastric ulcer    Social History   Socioeconomic History  . Marital status: Married    Spouse name: Not on file  . Number of children: 3  . Years of education: Not on file  . Highest education level: Not on file  Occupational History  . Not  on file  Tobacco Use  . Smoking status: Former    Years: 30.00    Types: Cigarettes    Quit date: 01/20/1981    Years since quitting: 41.4  . Smokeless tobacco: Never  Substance and Sexual Activity  . Alcohol use: No  . Drug use: No  . Sexual activity: Not on file  Other Topics Concern  . Not on file  Social History Narrative  . Not on file   Social Determinants of Health   Financial Resource Strain: Not on file  Food Insecurity: Not on file  Transportation Needs: Not on file  Physical Activity: Not on file  Stress: Not on file  Social Connections: Not on file   Family History  Problem Relation Age of Onset  . Kidney cancer Mother   .  Hypertension Father    Scheduled Meds: . clopidogrel  75 mg Oral Daily  . donepezil  5 mg Oral Daily  . enoxaparin (LOVENOX) injection  40 mg Subcutaneous Q24H  . escitalopram  20 mg Oral Daily  . guaiFENesin  600 mg Oral BID  . lidocaine      . sodium chloride flush  3 mL Intravenous Q12H   Continuous Infusions: . ceFEPime (MAXIPIME) IV 2 g (06/11/22 0554)  . metronidazole 500 mg (06/11/22 0029)  . vancomycin     PRN Meds:.acetaminophen **OR** acetaminophen, albuterol, haloperidol lactate, lidocaine Medications Prior to Admission:  Prior to Admission medications   Medication Sig Start Date End Date Taking? Authorizing Provider  acetaminophen (TYLENOL) 500 MG tablet Take 500 mg by mouth daily as needed for mild pain or headache.   Yes [provider]  clopidogrel (PLAVIX) 75 MG tablet Take 75 mg by mouth daily. 01/27/20  Yes [provider]  donepezil (ARICEPT) 5 MG tablet Take 5 mg by mouth daily.   Yes [provider]  escitalopram (LEXAPRO) 20 MG tablet Take 20 mg by mouth daily. 01/24/20  Yes [provider]  ezetimibe (ZETIA) 10 MG tablet Take 10 mg by mouth daily.   Yes [provider]  isosorbide mononitrate (IMDUR) 60 MG 24 hr tablet Take 1 tablet (60 mg total) by mouth every morning. do not give if systolic blood pressure < 130 Patient taking differently: Take 60 mg by mouth every morning. 02/01/22  Yes Patrecia Pour, MD  aspirin EC 81 MG EC tablet Take 1 tablet (81 mg total) by mouth daily. Patient not taking: Reported on 06/10/2022 02/01/22   Patrecia Pour, MD  simvastatin (ZOCOR) 20 MG tablet Take 20 mg by mouth every evening.  02/27/12  [provider]   Allergies  Allergen Reactions  . Livalo [Pitavastatin] Other (See Comments)    Cramping and back pain  . Livalo [Pitavastatin] Other (See Comments)    Cramping and back pain  . Other Diarrhea and Other (See Comments)    Unnamed patch and tablets for dementia caused  diarrhea (Possibly galantamine, from outside source)    Review of Systems  Physical Exam  Vital Signs: BP (!) 161/74 (BP Location: Right Arm)   Pulse 64   Temp 98 F (36.7 C) (Oral)   Resp 16   Ht '5\' 7"'$  (1.702 m)   Wt 59 kg   SpO2 96%   BMI 20.36 kg/m  Pain Scale: 0-10   Pain Score: 0-No pain   SpO2: SpO2: 96 % O2 Device:SpO2: 96 % O2 Flow Rate: .O2 Flow Rate (L/min): 2 L/min  IO: Intake/output summary:  Intake/Output Summary (  Last 24 hours) at 06/11/2022 1205 Last data filed at 06/11/2022 8159 Gross per 24 hour  Intake 3 ml  Output 1000 ml  Net -997 ml    LBM: Last BM Date : 06/10/22 Baseline Weight: Weight: 59 kg Most recent weight: Weight: 59 kg     Palliative Assessment/Data:     Time In: *** Time Out: *** Time Total: *** Greater than 50%  of this time was spent counseling and coordinating care related to the above assessment and plan.  Signed by: Wadie Lessen, NP   Please contact Palliative Medicine Team phone at [phone] for questions and concerns.  For individual provider: See Shea Evans

## 2022-06-11 NOTE — Progress Notes (Signed)
Initial Nutrition Assessment  DOCUMENTATION CODES:   Non-severe (moderate) malnutrition in context of chronic illness  INTERVENTION:   Multivitamin w/ minerals daily Ensure Enlive po BID, each supplement provides 350 kcal and 20 grams of protein. Meal ordering with assist  NUTRITION DIAGNOSIS:   Moderate Malnutrition related to chronic illness as evidenced by mild fat depletion, moderate muscle depletion.  GOAL:   Patient will meet greater than or equal to 90% of their needs  MONITOR:   PO intake, Supplement acceptance, Labs, Weight trends  REASON FOR ASSESSMENT:   Malnutrition Screening Tool    ASSESSMENT:   88 y.o. male presented to the ED with fever, nausea, and vomiting. PMH includes dementia, CVA, HTN, and transitional cell carcinoma of bladder. Pt admitted with CAP.   Pt resting in bed, daughter at bedside.   Pt reports that his appetite is great and that he was eating well at home. States that his wife does the cooking and she is a Systems developer. Reports that he has 1 Equate shake in the morning with his cheese and egg biscuit sandwich. States that he had a [employer] sandwich for dinner on Monday and that was the last time he has ate. Family reports that he coughs a lot when eating at home; SLP already following pt.   Pt denies any weight loss within the past year. Believes that he has been stable at 150#. Reports that he use to weight 180#, but in the last 5 years he has steadily lost the weight. Pt reports that he uses a walker and wheelchair at home but would like to only use the walk to keep the muscles in his legs.   RD explained to family that pt diet was advanced. Family asking about if he would need to continue a chopped diet at home, RD deferred that to SLP and MD.   Medications reviewed and include: IV antibiotics Labs reviewed.  NUTRITION - FOCUSED PHYSICAL EXAM:  Flowsheet Row Most Recent Value  Orbital Region Mild depletion  Upper Arm Region Mild  depletion  Thoracic and Lumbar Region Mild depletion  Buccal Region Mild depletion  Temple Region No depletion  Clavicle Bone Region Moderate depletion  Clavicle and Acromion Bone Region Moderate depletion  Scapular Bone Region Moderate depletion  Dorsal Hand Severe depletion  Patellar Region Moderate depletion  Anterior Thigh Region Moderate depletion  Posterior Calf Region Moderate depletion  Edema (RD Assessment) None  Hair Reviewed  Eyes Reviewed  Mouth Reviewed  Skin Reviewed  Nails Reviewed       Diet Order:   Diet Order             DIET DYS 3 Room service appropriate? No; Fluid consistency: Thin  Diet effective now                   EDUCATION NEEDS:   No education needs have been identified at this time  Skin:  Skin Assessment: Reviewed RN Assessment  Last BM:  6/27  Height:   Ht Readings from Last 1 Encounters:  06/10/22 '5\' 7"'$  (1.702 m)    Weight:   Wt Readings from Last 1 Encounters:  06/11/22 75 kg    BMI:  Body mass index is 25.9 kg/m.  Estimated Nutritional Needs:   Kcal:  1800-2000  Protein:  90-105 grams  Fluid:  >/= 1.8 L    Hermina Barters RD, LDN Clinical Dietitian See [facility] for contact information.

## 2022-06-11 NOTE — Evaluation (Signed)
Clinical/Bedside Swallow Evaluation Patient Details  Name: Paul [name] MRN: [id number] Date of Birth: 08/21/1930  Today's Date: 06/11/2022 Time: SLP Start Time (ACUTE ONLY): 0930 SLP Stop Time (ACUTE ONLY): 1000 SLP Time Calculation (min) (ACUTE ONLY): 30 min  Past Medical History:  Past Medical History:  Diagnosis Date   Back pain    Bladder tumor    BPH (benign prostatic hyperplasia)    Hyperlipidemia    Hypertension    Impaired hearing BILATERAL HEARING AIDS   Personal history of gastric ulcer    Past Surgical History:  Past Surgical History:  Procedure Laterality Date   APPENDECTOMY  1957   CATARACT EXTRACTION W/ INTRAOCULAR LENS  IMPLANT, BILATERAL     CYSTOSCOPY WITH BIOPSY  03/08/2012   Procedure: CYSTOSCOPY WITH BIOPSY;  Surgeon: Claybon Jabs, MD;  Location: [facility];  Service: Urology;  Laterality: N/A;  gyrus   TONSILLECTOMY  CHILD   TRANSURETHRAL INCISION OF PROSTATE  03/08/2012   Procedure: TRANSURETHRAL INCISION OF THE PROSTATE (TUIP);  Surgeon: Claybon Jabs, MD;  Location[facility];  Service: Urology;  Laterality: N/A;   TRANSURETHRAL RESECTION OF BLADDER TUMOR  01/26/2012   Procedure: TRANSURETHRAL RESECTION OF BLADDER TUMOR (TURBT);  Surgeon: Claybon Jabs, MD;  Location: [facility];  Service: Urology;  Laterality: N/A;  GYRUS MYTOMICIN C    HPI:  Patient is a 88 y.o. 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 He presented with c/o fever, nausea, vomiting and patient c/o getting choked up while eating at times. When EMS arrived to home, patient started vomiting which continued while on way to hospital. In ED he was febrile at 102.7 degrees F, oxygen saturations as low as 89%  on RA and he was placed on 2L oxygen via nasal cannula. CXR showed chronic stable left pleural effusion with associated left basilar atelectasis or infiltrate. CT scan of the  abdomen pelvis concerning for right lower lobe pneumonia with enlarging thick-walled enhancing complex left pleural effusion concerning for empyema with masslike opacity in the left lower lobe for which neoplasm was not totally excluded. Patient made NPO awaiting SLP swallow evaluation.    Assessment / Plan / Recommendation  Clinical Impression  Patient presenting with clinical s/s of dysphagia as per this bedside/clinical swallow evaluation. Daughter in room who provided majority of history and SLP providing education regarding past swallow evaluation results including showing patient and daughter fluroscopy video from [place] back in 2019. 2019 MBS showed both cervical osteophytes as well as prominent cricopharyngeal bar which did cause some impedence of bolus transit. Following medullary CVA in February of 2023, patient was evaluted at bedside by SLP and no significant changes in swallow function were suspected at that time but recommendation was for family to seek OP MBS if his swallowing got worse. Patient denies any changes in swallowing but his daughter reports he is coughing and getting "choked up" more frequently. She also reports he does not eat chicken much anymore as he has difficulty swallowing it. SLP observed patient with cup and then straw sips of thin liquids followed by Dys 2 solids (mixed consistency fruit cup in syrup). He exhibited one instance of delayed cough with thin liquids but no immediate coughing or throat clears and he exhibited an instance of mildly delayed cough when eating mixed consistencies with fruit cup. No significant changes in oxygen saturations with supplemental oxygen via nasal cannula, with SpO2  remaining in mid-90%'s. As patient has h/o dysphagia, is currently with suspected PNA and patient having some coughing with PO's, SLP recommending MBS to r/o aspiration but in the meantime can start on Dys 3 solids, thin liquids. SLP Visit Diagnosis: Dysphagia, unspecified  (R13.10)    Aspiration Risk  Mild aspiration risk    Diet Recommendation Dysphagia 3 (Mech soft);Thin liquid   Liquid Administration via: Cup;Straw Medication Administration: Whole meds with puree Supervision: Patient able to self feed;Intermittent supervision to cue for compensatory strategies Compensations: Slow rate;Small sips/bites;Minimize environmental distractions Postural Changes: Seated upright at 90 degrees    Other  Recommendations Oral Care Recommendations: Oral care BID;Staff/trained caregiver to provide oral care    Recommendations for follow up therapy are one component of a multi-disciplinary discharge planning process, led by the attending physician.  Recommendations may be updated based on patient status, additional functional criteria and insurance authorization.  Follow up Recommendations Other (comment) (pending MBS results and GOC)      Assistance Recommended at Discharge Frequent or constant Supervision/Assistance  Functional Status Assessment Patient has had a recent decline in their functional status and demonstrates the ability to make significant improvements in function in a reasonable and predictable amount of time.  Frequency and Duration min 2x/week  1 week       Prognosis Prognosis for Safe Diet Advancement: Good      Swallow Study   General Date of Onset: 06/10/22 HPI: Patient is a 88 y.o. 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 He presented with c/o fever, nausea, vomiting and patient c/o getting choked up while eating at times. When EMS arrived to home, patient started vomiting which continued while on way to hospital. In ED he was febrile at 102.7 degrees F, oxygen saturations as low as 89%  on RA and he was placed on 2L oxygen via nasal cannula. CXR showed chronic stable left pleural effusion with associated left basilar atelectasis or infiltrate. CT scan of the abdomen pelvis  concerning for right lower lobe pneumonia with enlarging thick-walled enhancing complex left pleural effusion concerning for empyema with masslike opacity in the left lower lobe for which neoplasm was not totally excluded. Patient made NPO awaiting SLP swallow evaluation. Type of Study: Bedside Swallow Evaluation Previous Swallow Assessment: during admission in February of 2023 Diet Prior to this Study: Dysphagia 3 (soft);Regular;Thin liquids Temperature Spikes Noted: No Respiratory Status: Nasal cannula History of Recent Intubation: No Behavior/Cognition: Alert;Cooperative;Pleasant mood Oral Cavity Assessment: Within Functional Limits Oral Care Completed by SLP: No Oral Cavity - Dentition: Dentures, top;Dentures, bottom Vision: Functional for self-feeding Self-Feeding Abilities: Able to feed self Patient Positioning: Upright in bed Baseline Vocal Quality: Normal Volitional Cough: Strong Volitional Swallow: Able to elicit    Oral/Motor/Sensory Function Overall Oral Motor/Sensory Function: Within functional limits   Ice Chips     Thin Liquid Thin Liquid: Impaired Presentation: Straw;Self Fed;Cup Pharyngeal  Phase Impairments: Cough - Delayed    Nectar Thick     Honey Thick     Puree Puree: Not tested   Solid     Solid: Impaired Presentation: Self Fed;Spoon Pharyngeal Phase Impairments: Cough - Delayed     Sonia Baller, MA, CCC-SLP Speech Therapy

## 2022-06-11 NOTE — Progress Notes (Signed)
Pt was brought to [place] for diagnostic thoracentesis. Upon US examination, there was trace fluid on L and no fluid on R large enough to safely access.  Exam was ended and explained to patient.   Patient and daughter in agreement with findings.    Care team made aware of findings.     Narda Rutherford, AGNP-BC 06/11/2022, 11:36 AM

## 2022-06-11 NOTE — Progress Notes (Signed)
Progress Note    SAI ZINN  CLE:[id number] DOB: Jun 16, 1930  DOA: 06/10/2022 PCP: Crist Infante, MD      Brief Narrative:    Medical records reviewed and are as summarized below:  Paul [name] is a 88 y.o. male with medical history significant of dementia, hypertension, CVA of left medulla in 01/2022, bladder cancer in remission, and gastric ulcer who presented with complaints of fever, nausea, and vomiting.    He was admitted to the hospital for community-acquired right lower lobe pneumonia complicated by acute hypoxic respiratory failure.  CT abdomen and pelvis showed left complex pleural effusion concerning for probable empyema.  Thoracentesis was attempted but ultrasound showed only trace amount of fluid that was not amenable to thoracentesis.     Assessment/Plan:   Principal Problem:   Pneumonia Active Problems:   Acute respiratory failure with hypoxia (HCC)   Loculated pleural effusion   Fever   Leukocytosis   Dysphagia   Dementia with behavioral disturbance (HCC)    Body mass index is 20.36 kg/m.   Community-acquired pneumonia: Continue empiric IV antibiotics.  Follow-up blood cultures.  Acute hypoxic respiratory failure: Continue 2 L/min oxygen via nasal cannula.  Taper off oxygen as able.  Suspected loculated left pleural effusion: Thoracentesis was attempted but ultrasound only showed trace amount of fluid which was unsafe for paracentesis.  Dysphagia: Speech therapist recommended dysphagia 3 diet.  Chronic diastolic CHF: Compensated.  2D echo in February 2023 showed preserved EF, grade 1 diastolic dysfunction  Dementia with behavioral disturbance: Ordered Seroquel for sedation at night.  Use Haldol injection as needed for severe agitation.  Diet Order             DIET DYS 3 Room service appropriate? Yes; Fluid consistency: Thin  Diet effective now                             Consultants: None  Procedures: None    Medications:    clopidogrel  75 mg Oral Daily   donepezil  5 mg Oral Daily   enoxaparin (LOVENOX) injection  40 mg Subcutaneous Q24H   escitalopram  20 mg Oral Daily   guaiFENesin  600 mg Oral BID   sodium chloride flush  3 mL Intravenous Q12H   Continuous Infusions:  ceFEPime (MAXIPIME) IV 2 g (06/11/22 0554)   metronidazole 500 mg (06/11/22 1242)   vancomycin       Anti-infectives (From admission, onward)    Start     Dose/Rate Route Frequency Ordered Stop   06/11/22 0800  vancomycin (VANCOREADY) IVPB 750 mg/150 mL        750 mg 150 mL/hr over 60 Minutes Intravenous Every 24 hours 06/10/22 1120     06/10/22 1700  ceFEPIme (MAXIPIME) 2 g in sodium chloride 0.9 % 100 mL IVPB        2 g 200 mL/hr over 30 Minutes Intravenous Every 12 hours 06/10/22 1118     06/10/22 1200  metroNIDAZOLE (FLAGYL) IVPB 500 mg        500 mg 100 mL/hr over 60 Minutes Intravenous Every 12 hours 06/10/22 1054     06/10/22 0345  ceFEPIme (MAXIPIME) 2 g in sodium chloride 0.9 % 100 mL IVPB        2 g 200 mL/hr over 30 Minutes Intravenous  Once 06/10/22 0333 06/10/22 0523   06/10/22 0345  metroNIDAZOLE (FLAGYL) IVPB 500 mg  500 mg 100 mL/hr over 60 Minutes Intravenous  Once 06/10/22 0333 06/10/22 0657   06/10/22 0345  vancomycin (VANCOCIN) IVPB 1000 mg/200 mL premix  Status:  Discontinued        1,000 mg 200 mL/hr over 60 Minutes Intravenous  Once 06/10/22 0333 06/10/22 0336   06/10/22 0345  vancomycin (VANCOREADY) IVPB 1250 mg/250 mL        1,250 mg 166.7 mL/hr over 90 Minutes Intravenous  Once 06/10/22 0336 06/10/22 7341              Family Communication/Anticipated D/C date and plan/Code Status   DVT prophylaxis: enoxaparin (LOVENOX) injection 40 mg Start: 06/10/22 2200     Code Status: Full Code  Family Communication: Melanie, daughter, at the bedside Disposition Plan: Plan to discharge home in 2 to 3  days   Status is: Inpatient Remains inpatient appropriate because: IV antibiotics for pneumonia       Subjective:   Interval events noted.  He feels better today.  No cough, chest pain or shortness of breath. [name], his daughter, was at the bedside.  She said patient was "sundowning last night".  She requested medicines for sedation.  Objective:    Vitals:   06/10/22 2322 06/11/22 0321 06/11/22 0751 06/11/22 1158  BP: (!) 145/62 (!) 118/95 (!) 162/67 (!) 161/74  Pulse: 63 64    Resp: '17 14  16  '$ Temp: 98.5 F (36.9 C) 98.4 F (36.9 C) 97.7 F (36.5 C) 98 F (36.7 C)  TempSrc: Oral Oral Oral Oral  SpO2: 97% 96%    Weight:      Height:       No data found.   Intake/Output Summary (Last 24 hours) at 06/11/2022 1336 Last data filed at 06/11/2022 9379 Gross per 24 hour  Intake 3 ml  Output 1000 ml  Net -997 ml   Filed Weights   06/10/22 0317  Weight: 59 kg    Exam:  GEN: NAD SKIN: Warm and dry EYES: No pallor or icterus ENT: MMM CV: RRR PULM: CTA B ABD: soft, ND, NT, +BS CNS: AAO x 1 (person), non focal EXT: No edema or tenderness    Data Reviewed:   I have personally reviewed following labs and imaging studies:  Labs: Labs show the following:   Basic Metabolic Panel: Recent Labs  Lab 06/10/22 0330 06/11/22 0058  NA 139 139  K 4.2 4.1  CL 108 107  CO2 24 25  GLUCOSE 149* 78  BUN 23 18  CREATININE 1.10 1.01  CALCIUM 8.5* 8.4*   GFR Estimated Creatinine Clearance: 40.6 mL/min (by C-G formula based on SCr of 1.01 mg/dL). Liver Function Tests: Recent Labs  Lab 06/10/22 0330 06/11/22 0058  AST 19 15  ALT 12 11  ALKPHOS 65 50  BILITOT 0.4 0.7  PROT 5.8* 5.2*  ALBUMIN 3.2* 2.7*   Recent Labs  Lab 06/10/22 0330  LIPASE 34   No results for input(s): "AMMONIA" in the last 168 hours. Coagulation profile Recent Labs  Lab 06/10/22 0330  INR 1.1    CBC: Recent Labs  Lab 06/10/22 0330 06/11/22 0058  WBC 10.8* 9.4   NEUTROABS 9.6*  --   HGB 11.6* 10.4*  HCT 37.2* 33.0*  MCV 89.4 89.2  PLT 281 233   Cardiac Enzymes: No results for input(s): "CKTOTAL", "CKMB", "CKMBINDEX", "TROPONINI" in the last 168 hours. BNP (last 3 results) No results for input(s): "PROBNP" in the last 8760 hours. CBG: Recent Labs  Lab  06/10/22 0330  GLUCAP 132*   D-Dimer: No results for input(s): "DDIMER" in the last 72 hours. Hgb A1c: No results for input(s): "HGBA1C" in the last 72 hours. Lipid Profile: No results for input(s): "CHOL", "HDL", "LDLCALC", "TRIG", "CHOLHDL", "LDLDIRECT" in the last 72 hours. Thyroid function studies: No results for input(s): "TSH", "T4TOTAL", "T3FREE", "THYROIDAB" in the last 72 hours.  Invalid input(s): "FREET3" Anemia work up: No results for input(s): "VITAMINB12", "FOLATE", "FERRITIN", "TIBC", "IRON", "RETICCTPCT" in the last 72 hours. Sepsis Labs: Recent Labs  Lab 06/10/22 0330 06/10/22 0550 06/11/22 0058  WBC 10.8*  --  9.4  LATICACIDVEN 2.8* 1.6  --     Microbiology Recent Results (from the past 240 hour(s))  Blood Culture (routine x 2)     Status: None (Preliminary result)   Collection Time: 06/10/22  3:30 AM   Specimen: BLOOD  Result Value Ref Range Status   Specimen Description BLOOD SITE NOT SPECIFIED  Final   Special Requests   Final    BOTTLES DRAWN AEROBIC AND ANAEROBIC Blood Culture results may not be optimal due to an excessive volume of blood received in culture bottles   Culture   Final    NO GROWTH 1 DAY Performed at [facility], [place] [address], [place], [place] [address]    Report Status PENDING  Incomplete  Blood Culture (routine x 2)     Status: None (Preliminary result)   Collection Time: 06/10/22  4:30 AM   Specimen: BLOOD  Result Value Ref Range Status   Specimen Description BLOOD SITE NOT SPECIFIED  Final   Special Requests   Final    BOTTLES DRAWN AEROBIC AND ANAEROBIC Blood Culture adequate volume   Culture   Final    NO GROWTH 1  DAY Performed at [facility], [place] [address] [place], [place] [address]    Report Status PENDING  Incomplete  Resp Panel by RT-PCR (Flu A&B, Covid) Anterior Nasal Swab     Status: None   Collection Time: 06/10/22  6:53 AM   Specimen: Anterior Nasal Swab  Result Value Ref Range Status   SARS Coronavirus 2 by RT PCR NEGATIVE NEGATIVE Final    Comment: (NOTE) SARS-CoV-2 target nucleic acids are NOT DETECTED.  The SARS-CoV-2 RNA is generally detectable in upper respiratory specimens during the acute phase of infection. The lowest concentration of SARS-CoV-2 viral copies this assay can detect is 138 copies/mL. A negative result does not preclude SARS-Cov-2 infection and should not be used as the sole basis for treatment or other patient management decisions. A negative result may occur with  improper specimen collection/handling, submission of specimen other than nasopharyngeal swab, presence of viral mutation(s) within the areas targeted by this assay, and inadequate number of viral copies(<138 copies/mL). A negative result must be combined with clinical observations, patient history, and epidemiological information. The expected result is Negative.  Fact Sheet for Patients:  EntrepreneurPulse.com.au  Fact Sheet for Healthcare Providers:  IncredibleEmployment.be  This test is no t yet approved or cleared by the [place] FDA and  has been authorized for detection and/or diagnosis of SARS-CoV-2 by FDA under an Emergency Use Authorization (EUA). This EUA will remain  in effect (meaning this test can be used) for the duration of the COVID-19 declaration under Section 564(b)(1) of the Act, 21 U.S.C.section 360bbb-3(b)(1), unless the authorization is terminated  or revoked sooner.       Influenza A by PCR NEGATIVE NEGATIVE Final   Influenza B  by PCR NEGATIVE NEGATIVE Final    Comment: (NOTE) The Xpert Xpress SARS-CoV-2/FLU/RSV plus  assay is intended as an aid in the diagnosis of influenza from Nasopharyngeal swab specimens and should not be used as a sole basis for treatment. Nasal washings and aspirates are unacceptable for Xpert Xpress SARS-CoV-2/FLU/RSV testing.  Fact Sheet for Patients: EntrepreneurPulse.com.au  Fact Sheet for Healthcare Providers: IncredibleEmployment.be  This test is not yet approved or cleared by the [place] FDA and has been authorized for detection and/or diagnosis of SARS-CoV-2 by FDA under an Emergency Use Authorization (EUA). This EUA will remain in effect (meaning this test can be used) for the duration of the COVID-19 declaration under Section 564(b)(1) of the Act, 21 U.S.C. section 360bbb-3(b)(1), unless the authorization is terminated or revoked.  Performed at [facility], [place] [address], [place], [place] [address]   Urine Culture     Status: None   Collection Time: 06/10/22 12:05 PM   Specimen: In/Out Cath Urine  Result Value Ref Range Status   Specimen Description IN/OUT CATH URINE  Final   Special Requests NONE  Final   Culture   Final    NO GROWTH Performed at [facility], [place] [address], [place], [place] [address]    Report Status 06/11/2022 FINAL  Final  MRSA Next Gen by PCR, Nasal     Status: None   Collection Time: 06/10/22 12:07 PM   Specimen: Nasal Swab  Result Value Ref Range Status   MRSA by PCR Next Gen NOT DETECTED NOT DETECTED Final    Comment: (NOTE) The GeneXpert MRSA Assay (FDA approved for NASAL specimens only), is one component of a comprehensive MRSA colonization surveillance program. It is not intended to diagnose MRSA infection nor to guide or monitor treatment for MRSA infections. Test performance is not FDA approved in patients less than 92 years old. Performed at [facility], [place] [address], [place], [place] [address]     Procedures and diagnostic studies:  IR US  CHEST  Result Date: 06/11/2022 CLINICAL DATA:  Left pleural effusion versus empyema EXAM: CHEST ULTRASOUND COMPARISON:  CT from previous day FINDINGS: Loculated small left pleural effusion with adhesion of lung to the parietal pleural surface noted, as seen on prior CT. No safe percutaneous approach was identified at the time of imaging for safe thoracentesis. IMPRESSION: Loculated small left effusion.  Thoracentesis deferred. Electronically Signed   By: Lucrezia Europe M.D.   On: 06/11/2022 12:17   CT Abdomen Pelvis W Contrast  Result Date: 06/10/2022 CLINICAL DATA:  88 year old male with history of left lower quadrant abdominal pain, vomiting and diarrhea. EXAM: CT ABDOMEN AND PELVIS WITH CONTRAST TECHNIQUE: Multidetector CT imaging of the abdomen and pelvis was performed using the standard protocol following bolus administration of intravenous contrast. RADIATION DOSE REDUCTION: This exam was performed according to the departmental dose-optimization program which includes automated exposure control, adjustment of the mA and/or kV according to patient size and/or use of iterative reconstruction technique. CONTRAST:  57m OMNIPAQUE IOHEXOL 300 MG/ML  SOLN COMPARISON:  CT the abdomen and pelvis 04/24/2020. FINDINGS: Comment: Today's study is limited by considerable patient respiratory motion. Lower chest: Incompletely imaged complex left pleural effusion with thickening and enhancement of the pleura, concerning for potential empyema. Mass-like opacity in the left lower lobe incompletely imaged. Patchy airspace consolidation in the dependent portion of the right lower lobe. Atherosclerotic calcifications in the descending thoracic aorta as well as the right coronary artery. Hepatobiliary:  Subcentimeter low-attenuation lesions in the liver are too small to definitively characterize, but similar to the prior examination and statistically likely to represent cysts. No other larger more suspicious appearing hepatic  lesions are noted. No intra or extrahepatic biliary ductal dilatation. Gallbladder is unremarkable in appearance. Pancreas: No pancreatic mass. No pancreatic ductal dilatation. No pancreatic or peripancreatic fluid collections or inflammatory changes. Spleen: Splenule inferior to the spleen, otherwise unremarkable. Adrenals/Urinary Tract: Moderate atrophy of the right kidney, similar to the prior examination. Left kidney and bilateral adrenal glands are otherwise unremarkable in appearance. No hydroureteronephrosis. Urinary bladder is normal in appearance. Stomach/Bowel: The appearance of the stomach is normal. No pathologic dilatation of small bowel or colon. Numerous colonic diverticulae are noted, particularly in the sigmoid colon, without definite focal surrounding inflammatory changes to indicate an acute diverticulitis at this time. The appendix is not confidently identified and may be surgically absent. Regardless, there are no inflammatory changes noted adjacent to the cecum to suggest the presence of an acute appendicitis at this time. Vascular/Lymphatic: Aortic atherosclerosis, without evidence of aneurysm or dissection in the abdominal or pelvic vasculature. No lymphadenopathy noted in the abdomen or pelvis. Reproductive: Prostate gland is enlarged and heterogeneous in appearance with median lobe hypertrophy. Seminal vesicles are unremarkable in appearance. Other: No significant volume of ascites.  No pneumoperitoneum. Musculoskeletal: There are no aggressive appearing lytic or blastic lesions noted in the visualized portions of the skeleton. IMPRESSION: 1. Extensive colonic diverticulosis, particularly in the sigmoid colon. No definitive inflammatory changes confidently identified to clearly indicate an acute diverticulitis at this time. 2. Right lower lobe pneumonia. 3. Enlarging thick-walled enhancing complex left pleural effusion concerning for probable empyema, with a mass-like opacity in the left  lower lobe. This could represent an area of rounded atelectasis, however, underlying neoplasm is not entirely excluded. Further clinical evaluation is recommended, in addition sampling of the pleural fluid if clinically appropriate. 4. Aortic atherosclerosis, in addition to at least right coronary artery disease. 5. Additional incidental findings, as above. Electronically Signed   By: Vinnie Langton M.D.   On: 06/10/2022 05:49   DG Chest Port 1 View  Result Date: 06/10/2022 CLINICAL DATA:  Sepsis EXAM: PORTABLE CHEST 1 VIEW COMPARISON:  01/30/2022 FINDINGS: Small, laterally loculated chronic left pleural effusion is unchanged with associated left basilar atelectasis or infiltrate contributing to retrocardiac opacification. Right lung is clear. No pneumothorax or pleural effusion on the right. Cardiac size within normal limits. Pulmonary vascularity is normal. IMPRESSION: Stable chronic left pleural effusion with associated left basilar atelectasis or infiltrate. Electronically Signed   By: Fidela Salisbury M.D.   On: 06/10/2022 03:54               LOS: 1 day   Rajinder Mesick  [facility]   Pager on [email or web address]. If 7PM-7AM, please contact night-coverage at [email or web address]     06/11/2022, 1:36 PM

## 2022-06-12 DIAGNOSIS — J189 Pneumonia, unspecified organism: Secondary | ICD-10-CM | POA: Diagnosis not present

## 2022-06-12 DIAGNOSIS — R627 Adult failure to thrive: Secondary | ICD-10-CM | POA: Diagnosis not present

## 2022-06-12 DIAGNOSIS — F03918 Unspecified dementia, unspecified severity, with other behavioral disturbance: Secondary | ICD-10-CM | POA: Diagnosis not present

## 2022-06-12 DIAGNOSIS — J9601 Acute respiratory failure with hypoxia: Secondary | ICD-10-CM | POA: Diagnosis not present

## 2022-06-12 DIAGNOSIS — E440 Moderate protein-calorie malnutrition: Secondary | ICD-10-CM | POA: Insufficient documentation

## 2022-06-12 DIAGNOSIS — R1310 Dysphagia, unspecified: Secondary | ICD-10-CM | POA: Diagnosis not present

## 2022-06-12 MED ORDER — SODIUM CHLORIDE 0.9 % IV SOLN
3.0000 g | Freq: Four times a day (QID) | INTRAVENOUS | Status: DC
  Administered 2022-06-12 – 2022-06-13 (×4): 3 g via INTRAVENOUS
  Filled 2022-06-12 (×4): qty 8

## 2022-06-12 MED ORDER — LIP MEDEX EX OINT: TOPICAL_OINTMENT | CUTANEOUS | Status: DC | PRN

## 2022-06-12 MED ORDER — QUETIAPINE FUMARATE 25 MG PO TABS
25.0000 mg | ORAL_TABLET | Freq: Two times a day (BID) | ORAL | Status: DC
  Administered 2022-06-12: 25 mg via ORAL
  Filled 2022-06-12 (×3): qty 1

## 2022-06-12 NOTE — Progress Notes (Signed)
Pt daughter at bedside and concerned that pt is too sleepy today.  Per night RN, pt was awake majority of the night and Haldol was administered.  Seroquel dose modified today.  RN able to administer Seroquel to pt.  Pt family wanting to skip Seroquel dose this evening.  Education provided on Seroquel and on attempting to keep pt on normal waking/sleep hours.  Will continue to monitor and provide education as needed.

## 2022-06-12 NOTE — Progress Notes (Signed)
Progress Note    Paul [name]  XTK:[id number] DOB: [birth year]  DOA: 06/10/2022 PCP: Crist Infante, MD      Brief Narrative:    Medical records reviewed and are as summarized below:  Paul [name] is a 88 y.o. male with medical history significant of dementia, hypertension, CVA of left medulla in 01/2022, bladder cancer in remission, and gastric ulcer who presented with complaints of fever, nausea, and vomiting.    He was admitted to the hospital for community-acquired right lower lobe pneumonia complicated by acute hypoxic respiratory failure.  CT abdomen and pelvis showed left complex pleural effusion concerning for probable empyema.  Thoracentesis was attempted but ultrasound showed only trace amount of fluid that was not amenable to thoracentesis.     Assessment/Plan:   Principal Problem:   Pneumonia Active Problems:   Acute respiratory failure with hypoxia (HCC)   Loculated pleural effusion   Fever   Leukocytosis   Dysphagia   Dementia with behavioral disturbance (HCC)   Malnutrition of moderate degree    Body mass index is 25.9 kg/m.   Community-acquired pneumonia: Continue empiric IV cefepime and Flagyl.  No growth on blood cultures thus far.  Acute hypoxic respiratory failure: Continue 2 L/min oxygen via nasal cannula.  Taper off oxygen as able.  Suspected loculated left pleural effusion: Thoracentesis was attempted on 06/11/2022, but ultrasound only showed trace amount of fluid which was unsafe for thoracentesis.  Dysphagia: Speech therapist recommended dysphagia 3 diet.  Chronic diastolic CHF: Compensated.  2D echo in February 2023 showed preserved EF, grade 1 diastolic dysfunction  Dementia with behavioral disturbance: Continue Seroquel as able.  Dose has been increased to 25 mg twice daily.  Use Haldol as needed for severe agitation.    Diet Order             DIET DYS 3 Room service appropriate? No; Fluid consistency: Thin  Diet effective now                             Consultants: None  Procedures: None    Medications:    clopidogrel  75 mg Oral Daily   donepezil  5 mg Oral Daily   enoxaparin (LOVENOX) injection  40 mg Subcutaneous Q24H   escitalopram  20 mg Oral Daily   feeding supplement  237 mL Oral BID BM   guaiFENesin  600 mg Oral BID   multivitamin with minerals  1 tablet Oral Daily   QUEtiapine  25 mg Oral BID   sodium chloride flush  3 mL Intravenous Q12H   ziprasidone  10 mg Intramuscular Once   Continuous Infusions:  ceFEPime (MAXIPIME) IV Stopped (06/11/22 1849)   metronidazole Stopped (06/12/22 1136)     Anti-infectives (From admission, onward)    Start     Dose/Rate Route Frequency Ordered Stop   06/11/22 0800  vancomycin (VANCOREADY) IVPB 750 mg/150 mL  Status:  Discontinued        750 mg 150 mL/hr over 60 Minutes Intravenous Every 24 hours 06/10/22 1120 06/12/22 0809   06/10/22 1700  ceFEPIme (MAXIPIME) 2 g in sodium chloride 0.9 % 100 mL IVPB        2 g 200 mL/hr over 30 Minutes Intravenous Every 12 hours 06/10/22 1118 06/17/22 1659   06/10/22 1200  metroNIDAZOLE (FLAGYL) IVPB 500 mg        500 mg 100 mL/hr over 60 Minutes Intravenous Every 12  hours 06/10/22 1054 06/17/22 1159   06/10/22 0345  ceFEPIme (MAXIPIME) 2 g in sodium chloride 0.9 % 100 mL IVPB        2 g 200 mL/hr over 30 Minutes Intravenous  Once 06/10/22 0333 06/10/22 0523   06/10/22 0345  metroNIDAZOLE (FLAGYL) IVPB 500 mg        500 mg 100 mL/hr over 60 Minutes Intravenous  Once 06/10/22 0333 06/10/22 0657   06/10/22 0345  vancomycin (VANCOCIN) IVPB 1000 mg/200 mL premix  Status:  Discontinued        1,000 mg 200 mL/hr over 60 Minutes Intravenous  Once 06/10/22 0333 06/10/22 0336   06/10/22 0345  vancomycin (VANCOREADY) IVPB 1250 mg/250 mL        1,250 mg 166.7 mL/hr over 90 Minutes Intravenous  Once 06/10/22 0336 06/10/22 1308              Family Communication/Anticipated D/C date and plan/Code  Status   DVT prophylaxis: enoxaparin (LOVENOX) injection 40 mg Start: 06/10/22 2200     Code Status: Full Code  Family Communication: Melanie, daughter, at the bedside Disposition Plan: Plan to discharge home in 2 days   Status is: Inpatient Remains inpatient appropriate because: IV antibiotics for pneumonia       Subjective:   Interval events noted.  His daughter, [name], was at the bedside.  She said patient had a rough night yesterday.  He required 2 doses of Haldol.  [name] is concerned about behavioral issues from dementia.  Objective:    Vitals:   06/12/22 0400 06/12/22 0741 06/12/22 1005 06/12/22 1139  BP: (!) 143/56   (!) 142/71  Pulse:  (P) 60  63  Resp: 15   16  Temp: 98 F (36.7 C)   97.9 F (36.6 C)  TempSrc: Oral (P) Oral  Oral  SpO2:   97% 94%  Weight:      Height:       No data found.   Intake/Output Summary (Last 24 hours) at 06/12/2022 1337 Last data filed at 06/12/2022 1146 Gross per 24 hour  Intake 958.84 ml  Output 1000 ml  Net -41.16 ml   Filed Weights   06/10/22 0317 06/11/22 1436  Weight: 59 kg 75 kg    Exam:  GEN: NAD SKIN: Warm and dry EYES: No acute abnormality ENT: MMM CV: RRR PULM: No wheezing or rales heard ABD: soft, ND,+BS CNS: Sleepy and difficult to arouse EXT: No edema      Data Reviewed:   I have personally reviewed following labs and imaging studies:  Labs: Labs show the following:   Basic Metabolic Panel: Recent Labs  Lab 06/10/22 0330 06/11/22 0058  NA 139 139  K 4.2 4.1  CL 108 107  CO2 24 25  GLUCOSE 149* 78  BUN 23 18  CREATININE 1.10 1.01  CALCIUM 8.5* 8.4*   GFR Estimated Creatinine Clearance: 45.4 mL/min (by C-G formula based on SCr of 1.01 mg/dL). Liver Function Tests: Recent Labs  Lab 06/10/22 0330 06/11/22 0058  AST 19 15  ALT 12 11  ALKPHOS 65 50  BILITOT 0.4 0.7  PROT 5.8* 5.2*  ALBUMIN 3.2* 2.7*   Recent Labs  Lab 06/10/22 0330  LIPASE 34   No results for  input(s): "AMMONIA" in the last 168 hours. Coagulation profile Recent Labs  Lab 06/10/22 0330  INR 1.1    CBC: Recent Labs  Lab 06/10/22 0330 06/11/22 0058  WBC 10.8* 9.4  NEUTROABS 9.6*  --  HGB 11.6* 10.4*  HCT 37.2* 33.0*  MCV 89.4 89.2  PLT 281 233   Cardiac Enzymes: No results for input(s): "CKTOTAL", "CKMB", "CKMBINDEX", "TROPONINI" in the last 168 hours. BNP (last 3 results) No results for input(s): "PROBNP" in the last 8760 hours. CBG: Recent Labs  Lab 06/10/22 0330  GLUCAP 132*   D-Dimer: No results for input(s): "DDIMER" in the last 72 hours. Hgb A1c: No results for input(s): "HGBA1C" in the last 72 hours. Lipid Profile: No results for input(s): "CHOL", "HDL", "LDLCALC", "TRIG", "CHOLHDL", "LDLDIRECT" in the last 72 hours. Thyroid function studies: No results for input(s): "TSH", "T4TOTAL", "T3FREE", "THYROIDAB" in the last 72 hours.  Invalid input(s): "FREET3" Anemia work up: No results for input(s): "VITAMINB12", "FOLATE", "FERRITIN", "TIBC", "IRON", "RETICCTPCT" in the last 72 hours. Sepsis Labs: Recent Labs  Lab 06/10/22 0330 06/10/22 0550 06/11/22 0058  WBC 10.8*  --  9.4  LATICACIDVEN 2.8* 1.6  --     Microbiology Recent Results (from the past 240 hour(s))  Blood Culture (routine x 2)     Status: None (Preliminary result)   Collection Time: 06/10/22  3:30 AM   Specimen: BLOOD  Result Value Ref Range Status   Specimen Description BLOOD SITE NOT SPECIFIED  Final   Special Requests   Final    BOTTLES DRAWN AEROBIC AND ANAEROBIC Blood Culture results may not be optimal due to an excessive volume of blood received in culture bottles   Culture   Final    NO GROWTH 2 DAYS Performed at [facility], [place] [address] [place], [place] [address]    Report Status PENDING  Incomplete  Blood Culture (routine x 2)     Status: None (Preliminary result)   Collection Time: 06/10/22  4:30 AM   Specimen: BLOOD  Result Value Ref Range Status    Specimen Description BLOOD SITE NOT SPECIFIED  Final   Special Requests   Final    BOTTLES DRAWN AEROBIC AND ANAEROBIC Blood Culture adequate volume   Culture   Final    NO GROWTH 2 DAYS Performed at [facility], [address], [place], [place] [address]    Report Status PENDING  Incomplete  Resp Panel by RT-PCR (Flu A&B, Covid) Anterior Nasal Swab     Status: None   Collection Time: 06/10/22  6:53 AM   Specimen: Anterior Nasal Swab  Result Value Ref Range Status   SARS Coronavirus 2 by RT PCR NEGATIVE NEGATIVE Final    Comment: (NOTE) SARS-CoV-2 target nucleic acids are NOT DETECTED.  The SARS-CoV-2 RNA is generally detectable in upper respiratory specimens during the acute phase of infection. The lowest concentration of SARS-CoV-2 viral copies this assay can detect is 138 copies/mL. A negative result does not preclude SARS-Cov-2 infection and should not be used as the sole basis for treatment or other patient management decisions. A negative result may occur with  improper specimen collection/handling, submission of specimen other than nasopharyngeal swab, presence of viral mutation(s) within the areas targeted by this assay, and inadequate number of viral copies(<138 copies/mL). A negative result must be combined with clinical observations, patient history, and epidemiological information. The expected result is Negative.  Fact Sheet for Patients:  EntrepreneurPulse.com.au  Fact Sheet for Healthcare Providers:  IncredibleEmployment.be  This test is no t yet approved or cleared by the [place] FDA and  has been authorized for detection and/or diagnosis of SARS-CoV-2 by FDA under an Emergency Use Authorization (EUA). This EUA will remain  in effect (meaning this test can be used) for the duration of the COVID-19 declaration under Section 564(b)(1) of the Act, 21 U.S.C.section 360bbb-3(b)(1), unless the authorization is  terminated  or revoked sooner.       Influenza A by PCR NEGATIVE NEGATIVE Final   Influenza B by PCR NEGATIVE NEGATIVE Final    Comment: (NOTE) The Xpert Xpress SARS-CoV-2/FLU/RSV plus assay is intended as an aid in the diagnosis of influenza from Nasopharyngeal swab specimens and should not be used as a sole basis for treatment. Nasal washings and aspirates are unacceptable for Xpert Xpress SARS-CoV-2/FLU/RSV testing.  Fact Sheet for Patients: EntrepreneurPulse.com.au  Fact Sheet for Healthcare Providers: IncredibleEmployment.be  This test is not yet approved or cleared by the [place] FDA and has been authorized for detection and/or diagnosis of SARS-CoV-2 by FDA under an Emergency Use Authorization (EUA). This EUA will remain in effect (meaning this test can be used) for the duration of the COVID-19 declaration under Section 564(b)(1) of the Act, 21 U.S.C. section 360bbb-3(b)(1), unless the authorization is terminated or revoked.  Performed at [facility], [place] [address] [place], [place] [address]   Urine Culture     Status: None   Collection Time: 06/10/22 12:05 PM   Specimen: In/Out Cath Urine  Result Value Ref Range Status   Specimen Description IN/OUT CATH URINE  Final   Special Requests NONE  Final   Culture   Final    NO GROWTH Performed at [facility], [place] [address], [place], [place] [address]    Report Status 06/11/2022 FINAL  Final  MRSA Next Gen by PCR, Nasal     Status: None   Collection Time: 06/10/22 12:07 PM   Specimen: Nasal Swab  Result Value Ref Range Status   MRSA by PCR Next Gen NOT DETECTED NOT DETECTED Final    Comment: (NOTE) The GeneXpert MRSA Assay (FDA approved for NASAL specimens only), is one component of a comprehensive MRSA colonization surveillance program. It is not intended to diagnose MRSA infection nor to guide or monitor treatment for MRSA infections. Test performance is  not FDA approved in patients less than 29 years old. Performed at [facility], [place] [address], [place],  [address]     Procedures and diagnostic studies:  DG Swallowing Func-Speech Pathology  Result Date: 06/11/2022 Table formatting from the original result was not included. Objective Swallowing Evaluation: Type of Study: MBS-Modified Barium Swallow Study  Patient Details Name: Paul [name] MRN: [id number] Date of Birth: [birth year] Today's Date: 06/11/2022 Time: SLP Start Time (ACUTE ONLY): 1340 -SLP Stop Time (ACUTE ONLY): 1400 SLP Time Calculation (min) (ACUTE ONLY): 20 min Past Medical History: Past Medical History: Diagnosis Date  Back pain   Bladder tumor   BPH (benign prostatic hyperplasia)   Hyperlipidemia   Hypertension   Impaired hearing BILATERAL HEARING AIDS  Personal history of gastric ulcer  Past Surgical History: Past Surgical History: Procedure Laterality Date  APPENDECTOMY  1957  CATARACT EXTRACTION W/ INTRAOCULAR LENS  IMPLANT, BILATERAL    CYSTOSCOPY WITH BIOPSY  03/08/2012  Procedure: CYSTOSCOPY WITH BIOPSY;  Surgeon: Claybon Jabs, MD;  Location: [facility];  Service: Urology;  Laterality: N/A;  gyrus  TONSILLECTOMY  CHILD  TRANSURETHRAL INCISION OF PROSTATE  03/08/2012  Procedure: TRANSURETHRAL INCISION OF THE PROSTATE (TUIP);  Surgeon: Claybon Jabs, MD;  Location: [facility];  Service: Urology;  Laterality: N/A;  TRANSURETHRAL RESECTION OF BLADDER TUMOR  01/26/2012  Procedure: TRANSURETHRAL RESECTION OF BLADDER TUMOR (TURBT);  Surgeon: Claybon Jabs, MD;  Location: [facility];  Service: Urology;  Laterality: N/A;  GYRUS MYTOMICIN C  HPI: Patient is a 88 y.o. 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 He presented with c/o fever, nausea, vomiting and patient c/o getting choked up while eating at times. When EMS arrived to home, patient started vomiting which  continued while on way to hospital. In ED he was febrile at 102.7 degrees F, oxygen saturations as low as 89%  on RA and he was placed on 2L oxygen via nasal cannula. CXR showed chronic stable left pleural effusion with associated left basilar atelectasis or infiltrate. CT scan of the abdomen pelvis concerning for right lower lobe pneumonia with enlarging thick-walled enhancing complex left pleural effusion concerning for empyema with masslike opacity in the left lower lobe for which neoplasm was not totally excluded. Patient made NPO awaiting SLP swallow evaluation.  Subjective: pleasant, daughter in viewing area  Recommendations for follow up therapy are one component of a multi-disciplinary discharge planning process, led by the attending physician.  Recommendations may be updated based on patient status, additional functional criteria and insurance authorization. Assessment / Plan / Recommendation   06/11/2022   3:43 PM Clinical Impressions Clinical Impression Patient presents with a mild oropharyngeal phase dysphagia and primary structural dysphagia secondary to cervical osteophytes as well as prominent cricopharyngeal bar (both noted on MBS 2019). During today's study, patient exhibited swallow initiation delay with thin liquids to level of pyriform sinus and with puree solids, nectar thick liquids, mechanical soft solids, delay was to level of vallecular sinus. Only trace to mild vallecular residuals observed with tested boluses with full clearance with subsequent swallows. As compared to previous study, patient now exhibiting consistent collection of all consistencies of barium in suspected diverticulum above cricopharyngeal bar. Barium in this outpouching would clear but never fully, however SLP did not observe any increase in amount of barium collected. Chin tuck posture did not help to clear barium. No penetration or aspiration occured with any of the tested consistencies. SLP is recommending continue with  Dys 3 solids, thin liquids. SLP provided daughter with some recommendations for swallow safety and will complete education prior to discharge of patient from SLP services. SLP Visit Diagnosis Dysphagia, unspecified (R13.10) Impact on safety and function Mild aspiration risk;No limitations     06/11/2022   3:43 PM Treatment Recommendations Treatment Recommendations Therapy as outlined in treatment plan below     06/11/2022   3:51 PM Prognosis Prognosis for Safe Diet Advancement Good Barriers to Reach Goals Time post onset   06/11/2022   3:43 PM Diet Recommendations SLP Diet Recommendations Dysphagia 3 (Mech soft) solids;Thin liquid Liquid Administration via Cup;Straw Medication Administration Whole meds with puree Compensations Slow rate;Small sips/bites;Minimize environmental distractions Postural Changes Seated upright at 90 degrees;Remain semi-upright after after feeds/meals (Comment)     06/11/2022   3:43 PM Other Recommendations Follow Up Recommendations No SLP follow up Assistance recommended at discharge Frequent or constant Supervision/Assistance Functional Status Assessment Patient has had a recent decline in their functional status and demonstrates the ability to make significant improvements in function in a reasonable and predictable amount of time.   06/11/2022   3:43 PM Frequency and Duration  Speech Therapy Frequency (ACUTE ONLY) min 1 x/week Treatment Duration 1 week     06/11/2022   3:40 PM Oral Phase Oral Phase Impaired  Oral - Nectar Cup Reduced posterior propulsion Oral - Thin Cup Reduced posterior propulsion Oral - Puree Reduced posterior propulsion Oral - Mech Soft Reduced posterior propulsion;Impaired mastication    06/11/2022   3:41 PM Pharyngeal Phase Pharyngeal Phase Impaired Pharyngeal- Nectar Cup Delayed swallow initiation-vallecula;Pharyngeal residue - valleculae;Pharyngeal residue - cp segment Pharyngeal- Thin Cup Delayed swallow initiation-pyriform sinuses;Pharyngeal residue -  valleculae;Pharyngeal residue - cp segment Pharyngeal- Puree Delayed swallow initiation-vallecula;Pharyngeal residue - cp segment Pharyngeal- Mechanical Soft Delayed swallow initiation-vallecula;Pharyngeal residue - cp segment    06/11/2022   3:42 PM Cervical Esophageal Phase  Cervical Esophageal Phase Impaired Nectar Cup Prominent cricopharyngeal segment Thin Cup Prominent cricopharyngeal segment Puree Prominent cricopharyngeal segment Mechanical Soft Prominent cricopharyngeal segment Cervical Esophageal Comment appearance of questionable diverticulum above cp bar  Sonia Baller, MA, CCC-SLP Speech Therapy                     IR US CHEST  Result Date: 06/11/2022 CLINICAL DATA:  Left pleural effusion versus empyema EXAM: CHEST ULTRASOUND COMPARISON:  CT from previous day FINDINGS: Loculated small left pleural effusion with adhesion of lung to the parietal pleural surface noted, as seen on prior CT. No safe percutaneous approach was identified at the time of imaging for safe thoracentesis. IMPRESSION: Loculated small left effusion.  Thoracentesis deferred. Electronically Signed   By: Lucrezia Europe M.D.   On: 06/11/2022 12:17               LOS: 2 days   Errin Chewning  [facility]   Pager on [email or web address]. If 7PM-7AM, please contact night-coverage at [email or web address]     06/12/2022, 1:37 PM

## 2022-06-12 NOTE — Progress Notes (Signed)
Patient ID: MCCLAIN SHALL, male   DOB: August 01, 1930, 88 y.o.   MRN: [id number]    Progress Note from the Palliative Medicine Team at [facility]   Patient Name: Paul [name]        Date: 06/12/2022 DOB: [birth year]  Age: 88 y.o. MRN#: [id number] Attending Physician: Jennye Boroughs, MD Primary Care Physician: Crist Infante, MD Admit Date: 06/10/2022   Medical records reviewed, discussed with treatment team  Paul [name] is a 88 y.o. male with medical history significant of dementia, hypertension, CVA of left medulla in 01/2022, bladder cancer in remission, and gastric ulcer who presented with complaints of fever, nausea, and vomiting.   Per family patient has had slow continued physical, functional and cognitive decline over the past many months most significantly since stroke in February 2023.     He was admitted to the hospital for community-acquired right lower lobe pneumonia complicated by acute hypoxic respiratory failure.  CT abdomen and pelvis showed left complex pleural effusion concerning for probable empyema.  Thoracentesis was attempted but ultrasound showed only trace amount of fluid that was not amenable to thoracentesis.  Hospitalization has been complicated by dementia associated behaviors.   This NP assessed patient at the bedside as a follow up for palliative medicine needs and emotional support and to meet as scheduled with patient's daughter, son and wife at bedside for continued conversation regarding current medical situation.  Family face treatment option decisions, advanced directive decisions and anticipatory care needs.  Education offered today on the natural trajectory and expectations with a diagnosis of dementia.  Education specific to adult failure to thrive specific to increasing weakness, poor p.o. intake and dysphagia.  Many questions addressed specific to aspiration and aspiration pneumonia risk.    Exploration of Paul [name] values related to quality of  life.  Family agree that patient would not want to "just exist".  They reflect on his continued physical, functional and cognitive decline over the past many months.  Education offered on hospice benefit; philosophy and eligibility.    All family present verbalize an understanding of the seriousness of the patient's current medical situation.  For now they remain hopeful for improvement, continue to treat the treatable through the weekend.    Plan is to meet again on Monday at 1:00 for clarification and next steps in plan of care  Plan of care -DNR/DNI-documented today -Family is open to all offered and available medical interventions to prolong life.  They remain hopeful for improvement.  Family need time through the weekend,  watchful waiting to see if patient can indeed make some improvements, hope is returned to baseline and return home.    Education offered today regarding  the importance of continued conversation with family and their  medical providers regarding overall plan of care and treatment options,  ensuring decisions are within the context of the patients values and GOCs.  Follow-up family meeting is scheduled for Monday at 1:00 with this NP  Questions and concerns addressed   Discussed with Dr Mal Misty via secure chat  PMT will continue to support holistically  This nurse practitioner informed  the family and the attending that I will be out of the hospital until Monday morning.  Call palliative medicine team phone # [phone] with questions or concerns in the interim   Wadie Lessen NP  Palliative Medicine Team Team Phone # [phone] Pager [phone]

## 2022-06-12 NOTE — Care Plan (Signed)
Pt confused and attempting to want to go home. Pt given Haldol '2mg'$  prn, pt continued to want to go home and was hard to redirect. Pt given 2nd dose of Haldol one time dose '5mg'$ .   Pt had intermittent periods of rest but did not full sleep.  Pt was hard to reorient to time and place.  Pt was unable to take most other medications  Louanne Skye

## 2022-06-13 DIAGNOSIS — J189 Pneumonia, unspecified organism: Secondary | ICD-10-CM | POA: Diagnosis not present

## 2022-06-13 DIAGNOSIS — R410 Disorientation, unspecified: Secondary | ICD-10-CM | POA: Diagnosis not present

## 2022-06-13 MED ORDER — AMOXICILLIN-POT CLAVULANATE 875-125 MG PO TABS: 1.0000 | ORAL_TABLET | Freq: Two times a day (BID) | ORAL | Status: DC

## 2022-06-13 MED ORDER — AMOXICILLIN-POT CLAVULANATE 875-125 MG PO TABS: 1.0000 | ORAL_TABLET | Freq: Two times a day (BID) | ORAL | 0 refills | Status: AC

## 2022-06-13 NOTE — TOC Progression Note (Addendum)
Transition of Care [facility]) - Progression Note    Patient Details  Name: CHETAN MEHRING MRN: [id number] Date of Birth: 07-Jan-1930  Transition of Care [facility]) CM/SW [place], RN Phone Number: 06/13/2022, 10:17 AM  Clinical Narrative:     Discussed discharge planning with patient and daughter. They are active with [facility] for PT OT and have attained other services . They have a walker and wheelchair at home.   Daughter can transport home when discharged.  CM will continue to follow 1325 Nurse asked this RNCM to call in the room as the daughter had questions regarding home health. No answer. Called daughter Gerald Stabs left confidential voice massage ti retuurn call Expected Discharge Plan: [place] Barriers to Discharge: No Barriers Identified  Expected Discharge Plan and Services Expected Discharge Plan: [place]   Discharge Planning Services: CM Consult Post Acute Care Choice: [place] (Active with [facility] also has services via [place])                             [place] Arranged: PT, OT HH Agency: [place] Date [place]: 06/13/22 Time [place]: [place] Representative spoke with at [place]: [place] ([place]) Interventions    Readmission Risk Interventions     No data to display

## 2022-06-13 NOTE — Progress Notes (Signed)
Received phone call at 6:03 PM from [facility], pts daughter, and daughter was able to locate pts slippers.  Melanie very apologetic, but happy slippers located.

## 2022-06-13 NOTE — Evaluation (Signed)
Physical Therapy Evaluation Patient Details Name: Paul [name] MRN: [id number] DOB: [birth year] Today's Date: 06/13/2022  History of Present Illness  Paul [name] is a 88 y.o. male with medical history significant of dementia, hypertension, CVA of left medulla in 01/2022, bladder cancer in remission, and gastric ulcer who presented with complaints of fever, nausea, and vomiting.   Found to have RLL PNA with hypoxic respiratory failure.  Clinical Impression  Patient presents with decreased mobility due to generalized weakness, deconditioning from prolonged bedrest.  Currently mod A overall for mobilizing to the bathroom.  Previously using walker with less assistance at home.  Patient will benefit from follow up [place] to resume at d/c and daughter educated in helping with pt along with her brother for safety at d/c.  Planned d/c today so will not follow acutely.        Recommendations for follow up therapy are one component of a multi-disciplinary discharge planning process, led by the attending physician.  Recommendations may be updated based on patient status, additional functional criteria and insurance authorization.  Follow Up Recommendations Home health PT      Assistance Recommended at Discharge Frequent or constant Supervision/Assistance  Patient can return home with the following  A lot of help with walking and/or transfers;A lot of help with bathing/dressing/bathroom;Assistance with cooking/housework;Help with stairs or ramp for entrance;Direct supervision/assist for medications management;Assist for transportation    Equipment Recommendations None recommended by PT  Recommendations for Other Services       Functional Status Assessment Patient has had a recent decline in their functional status and demonstrates the ability to make significant improvements in function in a reasonable and predictable amount of time.     Precautions / Restrictions Precautions Precautions: Fall       Mobility  Bed Mobility Overal bed mobility: Needs Assistance Bed Mobility: Supine to Sit     Supine to sit: Min assist, HOB elevated     General bed mobility comments: using rails    Transfers Overall transfer level: Needs assistance Equipment used: Rolling walker (2 wheels) Transfers: Sit to/from Stand Sit to Stand: Mod assist           General transfer comment: some lifting help, cues for anterior weight shift and hand placement    Ambulation/Gait Ambulation/Gait assistance: Mod assist Gait Distance (Feet): 10 Feet (& 15') Assistive device: Rolling walker (2 wheels) Gait Pattern/deviations: Step-to pattern, Step-through pattern, Decreased stride length, Trunk flexed, Wide base of support       General Gait Details: cues and assist for proximity to walker; fatigued and more flexed and further from walker, but made it to chair safely, educated daughter pt will need hands on help not just touching assistance with walking at home  Stairs            Wheelchair Mobility    Modified Rankin (Stroke Patients Only)       Balance Overall balance assessment: Needs assistance   Sitting balance-Leahy Scale: Poor Sitting balance - Comments: leaning back in sitting needs cues and occasional min A Postural control: Posterior lean Standing balance support: Bilateral upper extremity supported Standing balance-Leahy Scale: Poor Standing balance comment: reliant on UE support for balance in standing                             Pertinent Vitals/Pain Pain Assessment Pain Assessment: No/denies pain    Home Living Family/patient expects to be discharged to:: Private  residence Living Arrangements: Children Available Help at Discharge: Family;Available 24 hours/day Type of Home: House Home Access: Stairs to enter Entrance Stairs-Rails: Psychiatric nurse of Steps: 3   Home Layout: One level Home Equipment: Conservation officer, nature (2  wheels);Shower seat;Grab bars - tub/shower;BSC/3in1 Additional Comments: son bumps pt up stairs in wheelchair    Prior Function Prior Level of Function : Needs assist             Mobility Comments: has assistance with walking with a walker ADLs Comments: Family assists with all ADLs, as needed. Pt frequently incontinent of bowel.     Hand Dominance   Dominant Hand: Right    Extremity/Trunk Assessment   Upper Extremity Assessment Upper Extremity Assessment: Overall WFL for tasks assessed    Lower Extremity Assessment Lower Extremity Assessment: Generalized weakness    Cervical / Trunk Assessment Cervical / Trunk Assessment: Kyphotic  Communication   Communication: HOH  Cognition Arousal/Alertness: Awake/alert Behavior During Therapy: WFL for tasks assessed/performed Overall Cognitive Status: History of cognitive impairments - at baseline                                 General Comments: h/o dementia        General Comments General comments (skin integrity, edema, etc.): daughter present and gives history, reports pt not up for 5 days in hospital, pt had BM in bathroom during session, assisted for hygiene (daughter reports her brother has to help with this at home)    Exercises     Assessment/Plan    PT Assessment All further PT needs can be met in the next venue of care  PT Problem List         PT Treatment Interventions      PT Goals (Current goals can be found in the Care Plan section)  Acute Rehab PT Goals PT Goal Formulation: All assessment and education complete, DC therapy    Frequency       Co-evaluation               AM-PAC PT "6 Clicks" Mobility  Outcome Measure Help needed turning from your back to your side while in a flat bed without using bedrails?: A Lot Help needed moving from lying on your back to sitting on the side of a flat bed without using bedrails?: A Lot Help needed moving to and from a bed to a chair  (including a wheelchair)?: A Lot Help needed standing up from a chair using your arms (e.g., wheelchair or bedside chair)?: A Lot Help needed to walk in hospital room?: A Lot Help needed climbing 3-5 steps with a railing? : Total 6 Click Score: 11    End of Session Equipment Utilized During Treatment: Gait belt Activity Tolerance: Patient limited by fatigue Patient left: in chair;with call bell/phone within reach;with family/visitor present   PT Visit Diagnosis: Other abnormalities of gait and mobility (R26.89);Other symptoms and signs involving the nervous system (R29.898);Muscle weakness (generalized) (M62.81)    Time: 8315-1761 PT Time Calculation (min) (ACUTE ONLY): 36 min   Charges:   PT Evaluation $PT Eval Moderate Complexity: 1 Mod PT Treatments $Gait Training: 8-22 mins        Magda Kiel, PT Acute Rehabilitation Services YWVPX:[phone] Office:[phone] 06/13/2022   Reginia Naas 06/13/2022, 3:59 PM

## 2022-06-13 NOTE — Progress Notes (Signed)
Pt wife has MD appt this afternoon.  Wife's appt can not be canceled/moved.  Pt family requesting for pt to be discharged later this afternoon, after spouses appt.  Pt waiting on PT/OT prior to discharge.  Pt not able to transfer to discharge due to dementia.

## 2022-06-13 NOTE — Plan of Care (Signed)

## 2022-06-13 NOTE — Care Management Important Message (Signed)
Important Message  Patient Details  Name: Paul [name] MRN: [id number] Date of Birth: [birth year]   Medicare Important Message Given:  Yes     Orbie Pyo 06/13/2022, 4:26 PM

## 2022-06-13 NOTE — Progress Notes (Signed)
Order to discharge pt home.  Discharge instructions/AVS given to patient and daughter at bedside - education provided as needed.  Pt advised to call PCP and/or come back to the hospital if there are any problems. Pt verbalized understanding.

## 2022-06-13 NOTE — Progress Notes (Signed)
Speech Language Pathology Treatment: Dysphagia  Patient Details Name: Paul [name] MRN: [id number] DOB: 16-Jul-1930 Today's Date: 06/13/2022 Time: 0915-1002 SLP Time Calculation (min) (ACUTE ONLY): 47 min  Assessment / Plan / Recommendation Clinical Impression  Subtle cough after sequential swallows noted, which pt and daughter advise occurs frequently. Pt session focused on education re: dysphagia/dementia, progression of dysphagia, advise to assure oral care- dentures removed and mouth brushed daily.  Discussed compensation strategies to mitigate risk of aspiration. Daughter reports pt coughs every time he eats and cough can be extensive. Pt admits discomfort with reflexive coughing.  Recommended to monitor when coughing occurs (beginning, middle, end of meal),  what items were consumed or if not eating to provide further details to PCP if needed.  Further recommend pt stay upright after meals.   Advised pt maintain strength of cough, expectoration for airway protection including use of IS - which pt demonstrated with min cues after teaching.  Pt demonstrated "hock" with mild cues - .  Pt's daughter wrote notes in her phone regarding dysphagia compensation.   Educated re: items being aspirated that are more caustic versus others.  SLP will sign off at this time as all education completed using teach back.    HPI HPI: Patient is a 88 y.o. 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 He presented with c/o fever, nausea, vomiting and patient c/o getting choked up while eating at times. When EMS arrived to home, patient started vomiting which continued while on way to hospital. In ED he was febrile at 102.7 degrees F, oxygen saturations as low as 89%  on RA and he was placed on 2L oxygen via nasal cannula. CXR showed chronic stable left pleural effusion with associated left basilar atelectasis or infiltrate. CT scan of the abdomen pelvis concerning  for right lower lobe pneumonia with enlarging thick-walled enhancing complex left pleural effusion concerning for empyema with masslike opacity in the left lower lobe for which neoplasm was not totally excluded. Patient made NPO awaiting SLP swallow evaluation.      SLP Plan  Continue with current plan of care      Recommendations for follow up therapy are one component of a multi-disciplinary discharge planning process, led by the attending physician.  Recommendations may be updated based on patient status, additional functional criteria and insurance authorization.    Recommendations  Diet recommendations: Dysphagia 3 (mechanical soft);Thin liquid Liquids provided via: Straw Medication Administration: Whole meds with puree Supervision: Patient able to self feed Compensations: Slow rate;Small sips/bites;Minimize environmental distractions Postural Changes and/or Swallow Maneuvers: Seated upright 90 degrees;Upright 30-60 min after meal                Oral Care Recommendations: Oral care BID;Staff/trained caregiver to provide oral care Follow Up Recommendations: No SLP follow up Assistance recommended at discharge: Frequent or constant Supervision/Assistance SLP Visit Diagnosis: Dysphagia, unspecified (R13.10) Plan: Continue with current plan of care          Kathleen Lime, MS [place] Office [phone] Pager [phone]  Paul [name]  06/13/2022, 10:25 AM

## 2022-06-13 NOTE — Discharge Summary (Addendum)
Physician Discharge Summary   Patient: Paul [name] MRN: [id number] DOB: 1930/03/11  Admit date:     06/10/2022  Discharge date: 06/13/22  Discharge Physician: Jennye Boroughs   PCP: Crist Infante, MD   Recommendations at discharge:   Follow-up with PCP in 1 week  Discharge Diagnoses: Principal Problem:   Pneumonia Active Problems:   Acute respiratory failure with hypoxia (HCC)   Loculated pleural effusion   Fever   Leukocytosis   Dysphagia   Dementia with behavioral disturbance (HCC)   Malnutrition of moderate degree   Delirium  Resolved Problems:   * No resolved hospital problems. [facility] Hospital Course:  [name] is a 88 y.o. male with medical history significant of dementia, hypertension, chronic diastolic CHF, CVA of left medulla in 01/2022, bladder cancer in remission, and gastric ulcer who presented with complaints of fever, nausea, and vomiting.      He was admitted to the hospital for community-acquired right lower lobe pneumonia complicated by acute hypoxic respiratory failure. CT abdomen and pelvis showed left complex pleural effusion concerning for probable empyema.  Thoracentesis was attempted but ultrasound showed only trace amount of fluid that was not amenable to thoracentesis.   Hospital course was complicated by acute change in mental status with agitation which was attributed to delirium.  He was treated with Seroquel and Haldol as needed.  No growth on blood cultures to date.  His condition has improved and his mental status is back to baseline.  He is deemed stable for discharge to home today.  PT recommended home health therapy at discharge.  Discharge plan was discussed with the patient and his daughter, [name], at the bedside.         Consultants: None Procedures performed: None Disposition: Home health Diet recommendation:  Discharge Diet Orders (From admission, onward)     Start     Ordered   06/13/22 0000  Diet - low sodium heart  healthy        06/13/22 1242           Cardiac diet DISCHARGE MEDICATION: Allergies as of 06/13/2022       Reactions   Livalo [pitavastatin] Other (See Comments)   Cramping and back pain   Livalo [pitavastatin] Other (See Comments)   Cramping and back pain   Other Diarrhea, Other (See Comments)   Unnamed patch and tablets for dementia caused diarrhea (Possibly galantamine, from outside source)         Medication List     STOP taking these medications    aspirin EC 81 MG tablet       TAKE these medications    acetaminophen 500 MG tablet Commonly known as: TYLENOL Take 500 mg by mouth daily as needed for mild pain or headache.   amoxicillin-clavulanate 875-125 MG tablet Commonly known as: AUGMENTIN Take 1 tablet by mouth 2 (two) times daily for 3 days.   clopidogrel 75 MG tablet Commonly known as: PLAVIX Take 75 mg by mouth daily.   donepezil 5 MG tablet Commonly known as: ARICEPT Take 5 mg by mouth daily.   escitalopram 20 MG tablet Commonly known as: LEXAPRO Take 20 mg by mouth daily.   ezetimibe 10 MG tablet Commonly known as: ZETIA Take 10 mg by mouth daily.   isosorbide mononitrate 60 MG 24 hr tablet Commonly known as: IMDUR Take 1 tablet (60 mg total) by mouth every morning. do not give if systolic blood pressure < 130 What changed: additional instructions  Follow-up Information     Crist Infante, MD Follow up.   Specialty: Internal Medicine Why: appointment in one week Contact information: [place] [address] [place], [place] Follow up.   Specialty: Home Health Services Why: for PT OT Contact information: [place] [address] [phone]                Discharge Exam: [name] Weights   06/10/22 0317 06/11/22 1436  Weight: 59 kg 75 kg   GEN: NAD SKIN: Warm and dry EYES: No pallor or icterus ENT: MMM CV: RRR PULM: CTA B ABD: soft, ND,  NT, +BS CNS: AAO x 3, conversant, non focal EXT: No edema or tenderness   Condition at discharge: good  The results of significant diagnostics from this hospitalization (including imaging, microbiology, ancillary and laboratory) are listed below for reference.   Imaging Studies: DG Swallowing Func-Speech Pathology  Result Date: 06/11/2022 Table formatting from the original result was not included. Objective Swallowing Evaluation: Type of Study: MBS-Modified Barium Swallow Study  Patient Details Name: Paul [name] MRN: [id number] Date of Birth: February 18, 1930 Today's Date: 06/11/2022 Time: SLP Start Time (ACUTE ONLY): 1340 -SLP Stop Time (ACUTE ONLY): 1400 SLP Time Calculation (min) (ACUTE ONLY): 20 min Past Medical History: Past Medical History: Diagnosis Date  Back pain   Bladder tumor   BPH (benign prostatic hyperplasia)   Hyperlipidemia   Hypertension   Impaired hearing BILATERAL HEARING AIDS  Personal history of gastric ulcer  Past Surgical History: Past Surgical History: Procedure Laterality Date  APPENDECTOMY  1957  CATARACT EXTRACTION W/ INTRAOCULAR LENS  IMPLANT, BILATERAL    CYSTOSCOPY WITH BIOPSY  03/08/2012  Procedure: CYSTOSCOPY WITH BIOPSY;  Surgeon: Claybon Jabs, MD;  Location: [facility];  Service: Urology;  Laterality: N/A;  gyrus  TONSILLECTOMY  CHILD  TRANSURETHRAL INCISION OF PROSTATE  03/08/2012  Procedure: TRANSURETHRAL INCISION OF THE PROSTATE (TUIP);  Surgeon: Claybon Jabs, MD;  Location: [facility];  Service: Urology;  Laterality: N/A;  TRANSURETHRAL RESECTION OF BLADDER TUMOR  01/26/2012  Procedure: TRANSURETHRAL RESECTION OF BLADDER TUMOR (TURBT);  Surgeon: Claybon Jabs, MD;  Location: [facility];  Service: Urology;  Laterality: N/A;  GYRUS MYTOMICIN C  HPI: Patient is a 88 y.o. 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 He presented with c/o fever, nausea, vomiting and  patient c/o getting choked up while eating at times. When EMS arrived to home, patient started vomiting which continued while on way to hospital. In ED he was febrile at 102.7 degrees F, oxygen saturations as low as 89%  on RA and he was placed on 2L oxygen via nasal cannula. CXR showed chronic stable left pleural effusion with associated left basilar atelectasis or infiltrate. CT scan of the abdomen pelvis concerning for right lower lobe pneumonia with enlarging thick-walled enhancing complex left pleural effusion concerning for empyema with masslike opacity in the left lower lobe for which neoplasm was not totally excluded. Patient made NPO awaiting SLP swallow evaluation.  Subjective: pleasant, daughter in viewing area  Recommendations for follow up therapy are one component of a multi-disciplinary discharge planning process, led by the attending physician.  Recommendations may be updated based on patient status, additional functional criteria and insurance authorization. Assessment / Plan / Recommendation   06/11/2022   3:43 PM Clinical Impressions Clinical Impression  Patient presents with a mild oropharyngeal phase dysphagia and primary structural dysphagia secondary to cervical osteophytes as well as prominent cricopharyngeal bar (both noted on MBS 2019). During today's study, patient exhibited swallow initiation delay with thin liquids to level of pyriform sinus and with puree solids, nectar thick liquids, mechanical soft solids, delay was to level of vallecular sinus. Only trace to mild vallecular residuals observed with tested boluses with full clearance with subsequent swallows. As compared to previous study, patient now exhibiting consistent collection of all consistencies of barium in suspected diverticulum above cricopharyngeal bar. Barium in this outpouching would clear but never fully, however SLP did not observe any increase in amount of barium collected. Chin tuck posture did not help to clear  barium. No penetration or aspiration occured with any of the tested consistencies. SLP is recommending continue with Dys 3 solids, thin liquids. SLP provided daughter with some recommendations for swallow safety and will complete education prior to discharge of patient from SLP services. SLP Visit Diagnosis Dysphagia, unspecified (R13.10) Impact on safety and function Mild aspiration risk;No limitations     06/11/2022   3:43 PM Treatment Recommendations Treatment Recommendations Therapy as outlined in treatment plan below     06/11/2022   3:51 PM Prognosis Prognosis for Safe Diet Advancement Good Barriers to Reach Goals Time post onset   06/11/2022   3:43 PM Diet Recommendations SLP Diet Recommendations Dysphagia 3 (Mech soft) solids;Thin liquid Liquid Administration via Cup;Straw Medication Administration Whole meds with puree Compensations Slow rate;Small sips/bites;Minimize environmental distractions Postural Changes Seated upright at 90 degrees;Remain semi-upright after after feeds/meals (Comment)     06/11/2022   3:43 PM Other Recommendations Follow Up Recommendations No SLP follow up Assistance recommended at discharge Frequent or constant Supervision/Assistance Functional Status Assessment Patient has had a recent decline in their functional status and demonstrates the ability to make significant improvements in function in a reasonable and predictable amount of time.   06/11/2022   3:43 PM Frequency and Duration  Speech Therapy Frequency (ACUTE ONLY) min 1 x/week Treatment Duration 1 week     06/11/2022   3:40 PM Oral Phase Oral Phase Impaired Oral - Nectar Cup Reduced posterior propulsion Oral - Thin Cup Reduced posterior propulsion Oral - Puree Reduced posterior propulsion Oral - Mech Soft Reduced posterior propulsion;Impaired mastication    06/11/2022   3:41 PM Pharyngeal Phase Pharyngeal Phase Impaired Pharyngeal- Nectar Cup Delayed swallow initiation-vallecula;Pharyngeal residue - valleculae;Pharyngeal residue  - cp segment Pharyngeal- Thin Cup Delayed swallow initiation-pyriform sinuses;Pharyngeal residue - valleculae;Pharyngeal residue - cp segment Pharyngeal- Puree Delayed swallow initiation-vallecula;Pharyngeal residue - cp segment Pharyngeal- Mechanical Soft Delayed swallow initiation-vallecula;Pharyngeal residue - cp segment    06/11/2022   3:42 PM Cervical Esophageal Phase  Cervical Esophageal Phase Impaired Nectar Cup Prominent cricopharyngeal segment Thin Cup Prominent cricopharyngeal segment Puree Prominent cricopharyngeal segment Mechanical Soft Prominent cricopharyngeal segment Cervical Esophageal Comment appearance of questionable diverticulum above cp bar  Sonia Baller, MA, CCC-SLP Speech Therapy                     IR US CHEST  Result Date: 06/11/2022 CLINICAL DATA:  Left pleural effusion versus empyema EXAM: CHEST ULTRASOUND COMPARISON:  CT from previous day FINDINGS: Loculated small left pleural effusion with adhesion of lung to the parietal pleural surface noted, as seen on prior CT. No safe percutaneous approach was identified at the time of imaging for safe thoracentesis. IMPRESSION: Loculated small left effusion.  Thoracentesis deferred. Electronically Signed  By: Lucrezia Europe M.D.   On: 06/11/2022 12:17   CT Abdomen Pelvis W Contrast  Result Date: 06/10/2022 CLINICAL DATA:  88 year old male with history of left lower quadrant abdominal pain, vomiting and diarrhea. EXAM: CT ABDOMEN AND PELVIS WITH CONTRAST TECHNIQUE: Multidetector CT imaging of the abdomen and pelvis was performed using the standard protocol following bolus administration of intravenous contrast. RADIATION DOSE REDUCTION: This exam was performed according to the departmental dose-optimization program which includes automated exposure control, adjustment of the mA and/or kV according to patient size and/or use of iterative reconstruction technique. CONTRAST:  59m OMNIPAQUE IOHEXOL 300 MG/ML  SOLN COMPARISON:  CT the abdomen  and pelvis 04/24/2020. FINDINGS: Comment: Today's study is limited by considerable patient respiratory motion. Lower chest: Incompletely imaged complex left pleural effusion with thickening and enhancement of the pleura, concerning for potential empyema. Mass-like opacity in the left lower lobe incompletely imaged. Patchy airspace consolidation in the dependent portion of the right lower lobe. Atherosclerotic calcifications in the descending thoracic aorta as well as the right coronary artery. Hepatobiliary: Subcentimeter low-attenuation lesions in the liver are too small to definitively characterize, but similar to the prior examination and statistically likely to represent cysts. No other larger more suspicious appearing hepatic lesions are noted. No intra or extrahepatic biliary ductal dilatation. Gallbladder is unremarkable in appearance. Pancreas: No pancreatic mass. No pancreatic ductal dilatation. No pancreatic or peripancreatic fluid collections or inflammatory changes. Spleen: Splenule inferior to the spleen, otherwise unremarkable. Adrenals/Urinary Tract: Moderate atrophy of the right kidney, similar to the prior examination. Left kidney and bilateral adrenal glands are otherwise unremarkable in appearance. No hydroureteronephrosis. Urinary bladder is normal in appearance. Stomach/Bowel: The appearance of the stomach is normal. No pathologic dilatation of small bowel or colon. Numerous colonic diverticulae are noted, particularly in the sigmoid colon, without definite focal surrounding inflammatory changes to indicate an acute diverticulitis at this time. The appendix is not confidently identified and may be surgically absent. Regardless, there are no inflammatory changes noted adjacent to the cecum to suggest the presence of an acute appendicitis at this time. Vascular/Lymphatic: Aortic atherosclerosis, without evidence of aneurysm or dissection in the abdominal or pelvic vasculature. No lymphadenopathy  noted in the abdomen or pelvis. Reproductive: Prostate gland is enlarged and heterogeneous in appearance with median lobe hypertrophy. Seminal vesicles are unremarkable in appearance. Other: No significant volume of ascites.  No pneumoperitoneum. Musculoskeletal: There are no aggressive appearing lytic or blastic lesions noted in the visualized portions of the skeleton. IMPRESSION: 1. Extensive colonic diverticulosis, particularly in the sigmoid colon. No definitive inflammatory changes confidently identified to clearly indicate an acute diverticulitis at this time. 2. Right lower lobe pneumonia. 3. Enlarging thick-walled enhancing complex left pleural effusion concerning for probable empyema, with a mass-like opacity in the left lower lobe. This could represent an area of rounded atelectasis, however, underlying neoplasm is not entirely excluded. Further clinical evaluation is recommended, in addition sampling of the pleural fluid if clinically appropriate. 4. Aortic atherosclerosis, in addition to at least right coronary artery disease. 5. Additional incidental findings, as above. Electronically Signed   By: DVinnie LangtonM.D.   On: 06/10/2022 05:49   DG Chest Port 1 View  Result Date: 06/10/2022 CLINICAL DATA:  Sepsis EXAM: PORTABLE CHEST 1 VIEW COMPARISON:  01/30/2022 FINDINGS: Small, laterally loculated chronic left pleural effusion is unchanged with associated left basilar atelectasis or infiltrate contributing to retrocardiac opacification. Right lung is clear. No pneumothorax or pleural effusion on the right. Cardiac size  within normal limits. Pulmonary vascularity is normal. IMPRESSION: Stable chronic left pleural effusion with associated left basilar atelectasis or infiltrate. Electronically Signed   By: Fidela Salisbury M.D.   On: 06/10/2022 03:54    Microbiology: Results for orders placed or performed during the hospital encounter of 06/10/22  Blood Culture (routine x 2)     Status: None  (Preliminary result)   Collection Time: 06/10/22  3:30 AM   Specimen: BLOOD  Result Value Ref Range Status   Specimen Description BLOOD SITE NOT SPECIFIED  Final   Special Requests   Final    BOTTLES DRAWN AEROBIC AND ANAEROBIC Blood Culture results may not be optimal due to an excessive volume of blood received in culture bottles   Culture   Final    NO GROWTH 3 DAYS Performed at [facility], [place] [address], [place], [place] [address]    Report Status PENDING  Incomplete  Blood Culture (routine x 2)     Status: None (Preliminary result)   Collection Time: 06/10/22  4:30 AM   Specimen: BLOOD  Result Value Ref Range Status   Specimen Description BLOOD SITE NOT SPECIFIED  Final   Special Requests   Final    BOTTLES DRAWN AEROBIC AND ANAEROBIC Blood Culture adequate volume   Culture   Final    NO GROWTH 3 DAYS Performed at [facility], [address] [place], [place] [address]    Report Status PENDING  Incomplete  Resp Panel by RT-PCR (Flu A&B, Covid) Anterior Nasal Swab     Status: None   Collection Time: 06/10/22  6:53 AM   Specimen: Anterior Nasal Swab  Result Value Ref Range Status   SARS Coronavirus 2 by RT PCR NEGATIVE NEGATIVE Final    Comment: (NOTE) SARS-CoV-2 target nucleic acids are NOT DETECTED.  The SARS-CoV-2 RNA is generally detectable in upper respiratory specimens during the acute phase of infection. The lowest concentration of SARS-CoV-2 viral copies this assay can detect is 138 copies/mL. A negative result does not preclude SARS-Cov-2 infection and should not be used as the sole basis for treatment or other patient management decisions. A negative result may occur with  improper specimen collection/handling, submission of specimen other than nasopharyngeal swab, presence of viral mutation(s) within the areas targeted by this assay, and inadequate number of viral copies(<138 copies/mL). A negative result must be combined with clinical  observations, patient history, and epidemiological information. The expected result is Negative.  Fact Sheet for Patients:  EntrepreneurPulse.com.au  Fact Sheet for Healthcare Providers:  IncredibleEmployment.be  This test is no t yet approved or cleared by the [place] FDA and  has been authorized for detection and/or diagnosis of SARS-CoV-2 by FDA under an Emergency Use Authorization (EUA). This EUA will remain  in effect (meaning this test can be used) for the duration of the COVID-19 declaration under Section 564(b)(1) of the Act, 21 U.S.C.section 360bbb-3(b)(1), unless the authorization is terminated  or revoked sooner.       Influenza A by PCR NEGATIVE NEGATIVE Final   Influenza B by PCR NEGATIVE NEGATIVE Final    Comment: (NOTE) The Xpert Xpress SARS-CoV-2/FLU/RSV plus assay is intended as an aid in the diagnosis of influenza from Nasopharyngeal swab specimens and should not be used as a sole basis for treatment. Nasal washings and aspirates are unacceptable for Xpert Xpress SARS-CoV-2/FLU/RSV testing.  Fact Sheet for Patients: EntrepreneurPulse.com.au  Fact Sheet for Healthcare Providers: IncredibleEmployment.be  This test is not yet  approved or cleared by the [place] and has been authorized for detection and/or diagnosis of SARS-CoV-2 by FDA under an Emergency Use Authorization (EUA). This EUA will remain in effect (meaning this test can be used) for the duration of the COVID-19 declaration under Section 564(b)(1) of the Act, 21 U.S.C. section 360bbb-3(b)(1), unless the authorization is terminated or revoked.  Performed at [facility], [place] [address] [place], [place] [address]   Urine Culture     Status: None   Collection Time: 06/10/22 12:05 PM   Specimen: In/Out Cath Urine  Result Value Ref Range Status   Specimen Description IN/OUT CATH URINE  Final   Special  Requests NONE  Final   Culture   Final    NO GROWTH Performed at [facility], [place] [address] [place], [place] [address]    Report Status 06/11/2022 FINAL  Final  MRSA Next Gen by PCR, Nasal     Status: None   Collection Time: 06/10/22 12:07 PM   Specimen: Nasal Swab  Result Value Ref Range Status   MRSA by PCR Next Gen NOT DETECTED NOT DETECTED Final    Comment: (NOTE) The GeneXpert MRSA Assay (FDA approved for NASAL specimens only), is one component of a comprehensive MRSA colonization surveillance program. It is not intended to diagnose MRSA infection nor to guide or monitor treatment for MRSA infections. Test performance is not FDA approved in patients less than 45 years old. Performed at [facility], [place] [address] [place],  [address]     Labs: CBC: Recent Labs  Lab 06/10/22 0330 06/11/22 0058  WBC 10.8* 9.4  NEUTROABS 9.6*  --   HGB 11.6* 10.4*  HCT 37.2* 33.0*  MCV 89.4 89.2  PLT 281 893   Basic Metabolic Panel: Recent Labs  Lab 06/10/22 0330 06/11/22 0058  NA 139 139  K 4.2 4.1  CL 108 107  CO2 24 25  GLUCOSE 149* 78  BUN 23 18  CREATININE 1.10 1.01  CALCIUM 8.5* 8.4*   Liver Function Tests: Recent Labs  Lab 06/10/22 0330 06/11/22 0058  AST 19 15  ALT 12 11  ALKPHOS 65 50  BILITOT 0.4 0.7  PROT 5.8* 5.2*  ALBUMIN 3.2* 2.7*   CBG: Recent Labs  Lab 06/10/22 0330  GLUCAP 132*    Discharge time spent: greater than 30 minutes.  Signed: Jennye Boroughs, MD [facility] 06/13/2022

## 2022-06-13 NOTE — Progress Notes (Signed)
Pt family unsure if pts bedroom slippers were left in the ED or if they were with pt on arrival to the floor.  Pt adamant he did not have the slippers on when he went to the ED, however, the wife stated the slippers are not at home (per daughter at bedside).  Pt daughter upset saying the slippers were the pts "favorite" and at the same time, the pt raised his voice at the daughter stating he did not have the slippers with him when he went to the hospital.  RN called the ED to attempt to locate slippers.  Will continue to provide support.

## 2022-06-15 LAB — CULTURE, BLOOD (ROUTINE X 2)
Culture: NO GROWTH
Culture: NO GROWTH
Special Requests: ADEQUATE

## 2022-06-20 ENCOUNTER — Emergency Department (HOSPITAL_COMMUNITY): Payer: PPO

## 2022-06-20 ENCOUNTER — Encounter (HOSPITAL_COMMUNITY): Payer: Self-pay | Admitting: Internal Medicine

## 2022-06-20 ENCOUNTER — Telehealth (HOSPITAL_COMMUNITY): Payer: Self-pay | Admitting: Pharmacy Technician

## 2022-06-20 ENCOUNTER — Other Ambulatory Visit (HOSPITAL_COMMUNITY): Payer: Self-pay

## 2022-06-20 ENCOUNTER — Inpatient Hospital Stay (HOSPITAL_COMMUNITY)
Admission: EM | Admit: 2022-06-20 | Discharge: 2022-06-24 | DRG: 872 | Disposition: A | Discharge status: Home / Self Care | Payer: PPO | Attending: Internal Medicine | Admitting: Internal Medicine

## 2022-06-20 ENCOUNTER — Other Ambulatory Visit: Payer: Self-pay

## 2022-06-20 DIAGNOSIS — Z7902 Long term (current) use of antithrombotics/antiplatelets: Secondary | ICD-10-CM

## 2022-06-20 DIAGNOSIS — I739 Peripheral vascular disease, unspecified: Secondary | ICD-10-CM | POA: Diagnosis present

## 2022-06-20 DIAGNOSIS — F32A Depression, unspecified: Secondary | ICD-10-CM | POA: Diagnosis not present

## 2022-06-20 DIAGNOSIS — A0472 Enterocolitis due to Clostridium difficile, not specified as recurrent: Secondary | ICD-10-CM | POA: Diagnosis present

## 2022-06-20 DIAGNOSIS — F0393 Unspecified dementia, unspecified severity, with mood disturbance: Secondary | ICD-10-CM | POA: Diagnosis not present

## 2022-06-20 DIAGNOSIS — Z8701 Personal history of pneumonia (recurrent): Secondary | ICD-10-CM

## 2022-06-20 DIAGNOSIS — Z8551 Personal history of malignant neoplasm of bladder: Secondary | ICD-10-CM | POA: Diagnosis not present

## 2022-06-20 DIAGNOSIS — Z888 Allergy status to other drugs, medicaments and biological substances status: Secondary | ICD-10-CM | POA: Diagnosis not present

## 2022-06-20 DIAGNOSIS — R159 Full incontinence of feces: Secondary | ICD-10-CM | POA: Diagnosis not present

## 2022-06-20 DIAGNOSIS — A414 Sepsis due to anaerobes: Principal | ICD-10-CM | POA: Diagnosis present

## 2022-06-20 DIAGNOSIS — J9811 Atelectasis: Secondary | ICD-10-CM | POA: Diagnosis present

## 2022-06-20 DIAGNOSIS — E860 Dehydration: Secondary | ICD-10-CM | POA: Diagnosis present

## 2022-06-20 DIAGNOSIS — B9689 Other specified bacterial agents as the cause of diseases classified elsewhere: Secondary | ICD-10-CM | POA: Diagnosis present

## 2022-06-20 DIAGNOSIS — K429 Umbilical hernia without obstruction or gangrene: Secondary | ICD-10-CM | POA: Diagnosis not present

## 2022-06-20 DIAGNOSIS — I5032 Chronic diastolic (congestive) heart failure: Secondary | ICD-10-CM | POA: Diagnosis not present

## 2022-06-20 DIAGNOSIS — Z8711 Personal history of peptic ulcer disease: Secondary | ICD-10-CM | POA: Diagnosis not present

## 2022-06-20 DIAGNOSIS — Z87891 Personal history of nicotine dependence: Secondary | ICD-10-CM

## 2022-06-20 DIAGNOSIS — F03918 Unspecified dementia, unspecified severity, with other behavioral disturbance: Secondary | ICD-10-CM | POA: Diagnosis not present

## 2022-06-20 DIAGNOSIS — J90 Pleural effusion, not elsewhere classified: Secondary | ICD-10-CM | POA: Diagnosis not present

## 2022-06-20 DIAGNOSIS — R112 Nausea with vomiting, unspecified: Secondary | ICD-10-CM | POA: Diagnosis not present

## 2022-06-20 DIAGNOSIS — N289 Disorder of kidney and ureter, unspecified: Secondary | ICD-10-CM

## 2022-06-20 DIAGNOSIS — H9190 Unspecified hearing loss, unspecified ear: Secondary | ICD-10-CM | POA: Diagnosis not present

## 2022-06-20 DIAGNOSIS — I3139 Other pericardial effusion (noninflammatory): Secondary | ICD-10-CM | POA: Diagnosis not present

## 2022-06-20 DIAGNOSIS — R7881 Bacteremia: Secondary | ICD-10-CM | POA: Diagnosis not present

## 2022-06-20 DIAGNOSIS — Z8673 Personal history of transient ischemic attack (TIA), and cerebral infarction without residual deficits: Secondary | ICD-10-CM

## 2022-06-20 DIAGNOSIS — N400 Enlarged prostate without lower urinary tract symptoms: Secondary | ICD-10-CM | POA: Diagnosis not present

## 2022-06-20 DIAGNOSIS — R197 Diarrhea, unspecified: Principal | ICD-10-CM

## 2022-06-20 DIAGNOSIS — Z974 Presence of external hearing-aid: Secondary | ICD-10-CM

## 2022-06-20 DIAGNOSIS — K4020 Bilateral inguinal hernia, without obstruction or gangrene, not specified as recurrent: Secondary | ICD-10-CM | POA: Diagnosis not present

## 2022-06-20 DIAGNOSIS — F05 Delirium due to known physiological condition: Secondary | ICD-10-CM | POA: Diagnosis not present

## 2022-06-20 DIAGNOSIS — Z8051 Family history of malignant neoplasm of kidney: Secondary | ICD-10-CM

## 2022-06-20 DIAGNOSIS — Z79899 Other long term (current) drug therapy: Secondary | ICD-10-CM | POA: Diagnosis not present

## 2022-06-20 DIAGNOSIS — R1310 Dysphagia, unspecified: Secondary | ICD-10-CM

## 2022-06-20 DIAGNOSIS — Z66 Do not resuscitate: Secondary | ICD-10-CM | POA: Diagnosis present

## 2022-06-20 DIAGNOSIS — J929 Pleural plaque without asbestos: Secondary | ICD-10-CM | POA: Diagnosis not present

## 2022-06-20 DIAGNOSIS — R6520 Severe sepsis without septic shock: Secondary | ICD-10-CM | POA: Diagnosis not present

## 2022-06-20 DIAGNOSIS — R1111 Vomiting without nausea: Secondary | ICD-10-CM | POA: Diagnosis not present

## 2022-06-20 DIAGNOSIS — A419 Sepsis, unspecified organism: Secondary | ICD-10-CM | POA: Diagnosis present

## 2022-06-20 DIAGNOSIS — S2242XS Multiple fractures of ribs, left side, sequela: Secondary | ICD-10-CM

## 2022-06-20 DIAGNOSIS — W19XXXS Unspecified fall, sequela: Secondary | ICD-10-CM | POA: Diagnosis present

## 2022-06-20 DIAGNOSIS — J432 Centrilobular emphysema: Secondary | ICD-10-CM | POA: Diagnosis not present

## 2022-06-20 DIAGNOSIS — I110 Hypertensive heart disease with heart failure: Secondary | ICD-10-CM | POA: Diagnosis not present

## 2022-06-20 DIAGNOSIS — E785 Hyperlipidemia, unspecified: Secondary | ICD-10-CM | POA: Diagnosis present

## 2022-06-20 DIAGNOSIS — Z8249 Family history of ischemic heart disease and other diseases of the circulatory system: Secondary | ICD-10-CM

## 2022-06-20 DIAGNOSIS — A09 Infectious gastroenteritis and colitis, unspecified: Secondary | ICD-10-CM

## 2022-06-20 DIAGNOSIS — R1312 Dysphagia, oropharyngeal phase: Secondary | ICD-10-CM | POA: Diagnosis present

## 2022-06-20 DIAGNOSIS — K449 Diaphragmatic hernia without obstruction or gangrene: Secondary | ICD-10-CM | POA: Diagnosis not present

## 2022-06-20 DIAGNOSIS — J441 Chronic obstructive pulmonary disease with (acute) exacerbation: Secondary | ICD-10-CM | POA: Diagnosis not present

## 2022-06-20 DIAGNOSIS — Z9181 History of falling: Secondary | ICD-10-CM

## 2022-06-20 LAB — GASTROINTESTINAL PANEL BY PCR, STOOL (REPLACES STOOL CULTURE)

## 2022-06-20 LAB — COMPREHENSIVE METABOLIC PANEL
ALT: 38 U/L (ref 0–44)
AST: 26 U/L (ref 15–41)
Albumin: 3.5 g/dL (ref 3.5–5.0)
Alkaline Phosphatase: 95 U/L (ref 38–126)
Anion gap: 12 (ref 5–15)
BUN: 18 mg/dL (ref 8–23)
CO2: 23 mmol/L (ref 22–32)
Calcium: 9 mg/dL (ref 8.9–10.3)
Chloride: 104 mmol/L (ref 98–111)
Creatinine, Ser: 1.29 mg/dL — ABNORMAL HIGH (ref 0.61–1.24)
GFR, Estimated: 53 mL/min — ABNORMAL LOW (ref >60)
Glucose, Bld: 157 mg/dL — ABNORMAL HIGH (ref 70–99)
Potassium: 5.1 mmol/L (ref 3.5–5.1)
Sodium: 139 mmol/L (ref 135–145)
Total Bilirubin: 0.6 mg/dL (ref 0.3–1.2)
Total Protein: 6.4 g/dL — ABNORMAL LOW (ref 6.5–8.1)

## 2022-06-20 LAB — CBC WITH DIFFERENTIAL/PLATELET
Abs Immature Granulocytes: 0.09 10*3/uL — ABNORMAL HIGH (ref 0.00–0.07)
Basophils Absolute: 0 10*3/uL (ref 0.0–0.1)
Basophils Relative: 0 %
Eosinophils Absolute: 0 10*3/uL (ref 0.0–0.5)
Eosinophils Relative: 0 %
HCT: 39.7 % (ref 39.0–52.0)
Hemoglobin: 12.4 g/dL — ABNORMAL LOW (ref 13.0–17.0)
Immature Granulocytes: 1 %
Lymphocytes Relative: 2 %
Lymphs Abs: 0.3 10*3/uL — ABNORMAL LOW (ref 0.7–4.0)
MCH: 28 pg (ref 26.0–34.0)
MCHC: 31.2 g/dL (ref 30.0–36.0)
MCV: 89.6 fL (ref 80.0–100.0)
Monocytes Absolute: 0.5 10*3/uL (ref 0.1–1.0)
Monocytes Relative: 3 %
Neutro Abs: 16.3 10*3/uL — ABNORMAL HIGH (ref 1.7–7.7)
Neutrophils Relative %: 94 %
Platelets: 372 10*3/uL (ref 150–400)
RBC: 4.43 MIL/uL (ref 4.22–5.81)
RDW: 14.8 % (ref 11.5–15.5)
WBC: 17.2 10*3/uL — ABNORMAL HIGH (ref 4.0–10.5)
nRBC: 0 % (ref 0.0–0.2)

## 2022-06-20 LAB — URINALYSIS, ROUTINE W REFLEX MICROSCOPIC
Bacteria, UA: NONE SEEN
Bilirubin Urine: NEGATIVE
Glucose, UA: NEGATIVE mg/dL
Ketones, ur: NEGATIVE mg/dL
Leukocytes,Ua: NEGATIVE
Nitrite: NEGATIVE
Protein, ur: NEGATIVE mg/dL
Specific Gravity, Urine: >1.046 — ABNORMAL HIGH (ref 1.005–1.030)
pH: 5 (ref 5.0–8.0)

## 2022-06-20 LAB — C DIFFICILE QUICK SCREEN W PCR REFLEX
C Diff antigen: POSITIVE — AB
C Diff interpretation: DETECTED
C Diff toxin: POSITIVE — AB

## 2022-06-20 LAB — LACTIC ACID, PLASMA
Lactic Acid, Venous: 2 mmol/L (ref 0.5–1.9)
Lactic Acid, Venous: 2.1 mmol/L (ref 0.5–1.9)

## 2022-06-20 LAB — PROTIME-INR
INR: 1.1 (ref 0.8–1.2)
Prothrombin Time: 13.7 seconds (ref 11.4–15.2)

## 2022-06-20 LAB — APTT: aPTT: <20 seconds — ABNORMAL LOW (ref 24–36)

## 2022-06-20 MED ORDER — ENOXAPARIN SODIUM 40 MG/0.4ML IJ SOSY
40.0000 mg | PREFILLED_SYRINGE | Freq: Every day | INTRAMUSCULAR | Status: DC
Start: 2022-06-20 — End: 2022-06-24
  Administered 2022-06-20 – 2022-06-24 (×5): 40 mg via SUBCUTANEOUS
  Filled 2022-06-20 (×5): qty 0.4

## 2022-06-20 MED ORDER — QUETIAPINE FUMARATE 50 MG PO TABS
25.0000 mg | ORAL_TABLET | Freq: Two times a day (BID) | ORAL | Status: DC
  Administered 2022-06-20: 25 mg via ORAL
  Filled 2022-06-20 (×2): qty 1

## 2022-06-20 MED ORDER — FIDAXOMICIN 200 MG PO TABS
200.0000 mg | ORAL_TABLET | Freq: Two times a day (BID) | ORAL | Status: DC
  Administered 2022-06-20 – 2022-06-24 (×9): 200 mg via ORAL
  Filled 2022-06-20 (×11): qty 1

## 2022-06-20 MED ORDER — LACTATED RINGERS IV BOLUS
1000.0000 mL | Freq: Once | INTRAVENOUS | Status: AC
  Administered 2022-06-20: 1000 mL via INTRAVENOUS

## 2022-06-20 MED ORDER — SODIUM CHLORIDE 0.9% FLUSH
3.0000 mL | Freq: Two times a day (BID) | INTRAVENOUS | Status: DC
  Administered 2022-06-21 – 2022-06-24 (×6): 3 mL via INTRAVENOUS

## 2022-06-20 MED ORDER — CLOPIDOGREL BISULFATE 75 MG PO TABS
75.0000 mg | ORAL_TABLET | Freq: Every day | ORAL | Status: DC
  Administered 2022-06-20: 75 mg via ORAL
  Filled 2022-06-20: qty 1

## 2022-06-20 MED ORDER — SODIUM CHLORIDE 0.9 % IV SOLN: INTRAVENOUS | Status: DC

## 2022-06-20 MED ORDER — EZETIMIBE 10 MG PO TABS
10.0000 mg | ORAL_TABLET | Freq: Every day | ORAL | Status: DC
  Administered 2022-06-20 – 2022-06-24 (×5): 10 mg via ORAL
  Filled 2022-06-20 (×5): qty 1

## 2022-06-20 MED ORDER — SODIUM CHLORIDE 0.9 % IV SOLN
2.0000 g | Freq: Once | INTRAVENOUS | Status: AC
  Administered 2022-06-20: 2 g via INTRAVENOUS
  Filled 2022-06-20: qty 20

## 2022-06-20 MED ORDER — ONDANSETRON HCL 4 MG/2ML IJ SOLN: 4.0000 mg | Freq: Four times a day (QID) | INTRAMUSCULAR | Status: DC | PRN

## 2022-06-20 MED ORDER — IOHEXOL 300 MG/ML  SOLN
100.0000 mL | Freq: Once | INTRAMUSCULAR | Status: AC | PRN
Start: 2022-06-20 — End: 2022-06-20
  Administered 2022-06-20: 100 mL via INTRAVENOUS

## 2022-06-20 MED ORDER — ONDANSETRON HCL 4 MG PO TABS: 4.0000 mg | ORAL_TABLET | Freq: Four times a day (QID) | ORAL | Status: DC | PRN

## 2022-06-20 MED ORDER — ALBUTEROL SULFATE (2.5 MG/3ML) 0.083% IN NEBU: 2.5000 mg | INHALATION_SOLUTION | Freq: Four times a day (QID) | RESPIRATORY_TRACT | Status: DC | PRN

## 2022-06-20 MED ORDER — ESCITALOPRAM OXALATE 20 MG PO TABS
20.0000 mg | ORAL_TABLET | Freq: Every day | ORAL | Status: DC
  Administered 2022-06-20 – 2022-06-24 (×5): 20 mg via ORAL
  Filled 2022-06-20 (×4): qty 1
  Filled 2022-06-20: qty 2

## 2022-06-20 MED ORDER — ISOSORBIDE MONONITRATE ER 60 MG PO TB24
60.0000 mg | ORAL_TABLET | Freq: Every morning | ORAL | Status: DC
  Administered 2022-06-20 – 2022-06-24 (×5): 60 mg via ORAL
  Filled 2022-06-20 (×4): qty 1
  Filled 2022-06-20: qty 2

## 2022-06-20 MED ORDER — PROBIOTIC BIOGAIA/SOOTHE NICU ORAL SYRINGE
5.0000 [drp] | Freq: Every day | ORAL | Status: DC
  Administered 2022-06-20 – 2022-06-22 (×2): 5 [drp] via ORAL
  Filled 2022-06-20: qty 5

## 2022-06-20 MED ORDER — SODIUM CHLORIDE 0.9 % IV SOLN
500.0000 mg | INTRAVENOUS | Status: DC
  Administered 2022-06-20: 500 mg via INTRAVENOUS
  Filled 2022-06-20: qty 5

## 2022-06-20 MED ORDER — DONEPEZIL HCL 5 MG PO TABS
5.0000 mg | ORAL_TABLET | Freq: Every day | ORAL | Status: DC
  Administered 2022-06-21 – 2022-06-24 (×4): 5 mg via ORAL
  Filled 2022-06-20 (×5): qty 1

## 2022-06-20 NOTE — TOC Benefit Eligibility Note (Signed)
Patient Teacher, English as a foreign language completed.    The patient is currently admitted and upon discharge could be taking Dificid 200 mg tablets.  The current 30 day co-pay is, $1,494.01.   The patient is insured through [place], [place] Patient Advocate Specialist [place] Patient Advocate Team Direct Number: [phone]  Fax: [phone]

## 2022-06-20 NOTE — ED Triage Notes (Signed)
Pt via [facility] from home for eval of NVD x4hrs. Recent admission for pneumonia, discharged 6/30. Per EMS, pt w similar symptoms as last visit.   138/66 HR 88 99.6 temp  '4mg'$  zofran, relief of nausea/emesis '650mg'$  tylenol 0000 348m NS bolus  18LAC

## 2022-06-20 NOTE — Telephone Encounter (Signed)
Patient Advocate Encounter  Completed and sent [employer] application for Dificid for this patient.    Medication will be mailed to patient's home  Lyndel Safe, [place] Patient [place] Patient Advocate Team Direct Number: [phone]  Fax: [phone]

## 2022-06-20 NOTE — ED Notes (Signed)
Pt O2 sat 90% on RA; 2L [place] placed on pt, O2 sat improved to 94%

## 2022-06-20 NOTE — ED Notes (Signed)
The pts departure was  slowed because there was no bed in the room on 6n I have called back once  the person  answered the phone and said she would call for the bed again

## 2022-06-20 NOTE — Telephone Encounter (Signed)
Pharmacy Patient Advocate Encounter  Insurance verification completed.    The patient is insured through [employer] Part D   The patient is currently admitted and ran test claims for the following: Dificid 200 mg.  Copays and coinsurance results were relayed to Inpatient clinical team.

## 2022-06-20 NOTE — H&P (Addendum)
History and Physical    Patient: Paul [name] QAS:[id number] DOB: 1930-07-19 DOA: 06/20/2022 DOS: the patient was seen and examined on 06/20/2022 PCP: Crist Infante, MD  Patient coming from: Home  Chief Complaint:  Chief Complaint  Patient presents with   Nausea   Emesis   Diarrhea   HPI: Paul [name] is a 88 y.o. male with medical history significant of dementia, hypertension, CVA of left medulla in 06/9891, diastolic CHF, bladder cancer in remission, and gastric ulcer who presented with complaints of diarrhea with nausea and vomiting.  Patient has dementia and therefore history is obtained from his daughter over the phone. Patient had just recently been hospitalized 6/27-6/30 with acute respiratory failure with hypoxia secondary to community-acquired pneumonia possibly secondary to aspiration with findings of a loculated pleural effusion.  Thoracentesis was unable to be obtained as there was not enough fluid present when evaluated by ultrasound.  Patient had been discharged home to complete regimen of antibiotics with Augmentin for the pneumonia. He was able to discharged need of without oxygen. He finish the antibiotics 4 days ago, and developed symptoms yesterday.  When his son had gone him up he had been incontinent of stool in his brief.  He had 2-3 more episodes throughout the day.  Then later that evening around 10:30 PM complained of chills with fever up to 101 F prior to developing nausea and vomiting.  He had similar symptoms with the fever with nausea and vomiting prior to his last hospitalization, but without diarrhea.  His daughter notes that the patient still has had this intermittent cough and gets choked up from time to time with eating.  They always have a hard time getting him to keep himself adequately hydrated.   Upon admission into the emergency department patient was noted to be febrile up to 100.7 F, O2 saturation maintained on 2 L of nasal cannula oxygen, and all other  vital signs relatively maintained.  Labs significant for WBC 17.2, BUN 18, creatinine 1.29, and lactic acid 2->2.1.  CT scan of the chest abdomen pelvis noted similar appearance of moderate pleural effusion unchanged with left lower lobe consolidation and collapse with possibility of underlying neoplasm, emphysema, and no acute abnormality of the abdomen and pelvis.  Urinalysis noted elevated specific gravity but did not show signs of infection.  Patient had been given 1 L normal saline IV fluids, Rocephin, and azithromycin IV.  C. difficile studies positive for antigen and toxin.  Patient was started on Dificid.  Review of Systems: As mentioned in the history of present illness. All other systems reviewed and are negative. Past Medical History:  Diagnosis Date   Back pain    Bladder tumor    BPH (benign prostatic hyperplasia)    Hyperlipidemia    Hypertension    Impaired hearing BILATERAL HEARING AIDS   Personal history of gastric ulcer    Past Surgical History:  Procedure Laterality Date   APPENDECTOMY  1957   CATARACT EXTRACTION W/ INTRAOCULAR LENS  IMPLANT, BILATERAL     CYSTOSCOPY WITH BIOPSY  03/08/2012   Procedure: CYSTOSCOPY WITH BIOPSY;  Surgeon: Claybon Jabs, MD;  Location: [facility];  Service: Urology;  Laterality: N/A;  gyrus   TONSILLECTOMY  CHILD   TRANSURETHRAL INCISION OF PROSTATE  03/08/2012   Procedure: TRANSURETHRAL INCISION OF THE PROSTATE (TUIP);  Surgeon: Claybon Jabs, MD;  Location: [facility];  Service: Urology;  Laterality: N/A;   TRANSURETHRAL RESECTION OF BLADDER TUMOR  01/26/2012   Procedure: TRANSURETHRAL RESECTION OF BLADDER TUMOR (TURBT);  Surgeon: Claybon Jabs, MD;  Location: [facility];  Service: Urology;  Laterality: N/A;  GYRUS MYTOMICIN C    Social History:  reports that he quit smoking about 41 years ago. His smoking use included cigarettes. He has never used smokeless tobacco. He reports that he does  not drink alcohol and does not use drugs.  Allergies  Allergen Reactions   Livalo [Pitavastatin] Other (See Comments)    Cramping and back pain   Livalo [Pitavastatin] Other (See Comments)    Cramping and back pain   Other Diarrhea and Other (See Comments)    Unnamed patch and tablets for dementia caused diarrhea (Possibly galantamine, from outside source)     Family History  Problem Relation Age of Onset   Kidney cancer Mother    Hypertension Father     Prior to Admission medications   Medication Sig Start Date End Date Taking? Authorizing Provider  acetaminophen (TYLENOL) 500 MG tablet Take 500 mg by mouth daily as needed for mild pain or headache.    [provider]  clopidogrel (PLAVIX) 75 MG tablet Take 75 mg by mouth daily. 01/27/20   [provider]  donepezil (ARICEPT) 5 MG tablet Take 5 mg by mouth daily.    [provider]  escitalopram (LEXAPRO) 20 MG tablet Take 20 mg by mouth daily. 01/24/20   [provider]  ezetimibe (ZETIA) 10 MG tablet Take 10 mg by mouth daily.    [provider]  isosorbide mononitrate (IMDUR) 60 MG 24 hr tablet Take 1 tablet (60 mg total) by mouth every morning. do not give if systolic blood pressure < 130 Patient taking differently: Take 60 mg by mouth every morning. 02/01/22   Patrecia Pour, MD  simvastatin (ZOCOR) 20 MG tablet Take 20 mg by mouth every evening.  02/27/12  [provider]    Physical Exam: Vitals:   06/20/22 0815 06/20/22 0924 06/20/22 1000 06/20/22 1200  BP: (!) 115/59  (!) 113/57 116/66  Pulse: 80  75 85  Resp: 19  19 (!) 21  Temp:      TempSrc:      SpO2: 97%  97% 97%  Weight:  75 kg    Height:  '5\' 7"'$  (1.702 m)     Exam  Constitutional: Elderly male currently no acute distress Eyes: PERRL, lids and conjunctivae normal ENMT: Mucous membranes are dry.  Hard of hearing. Neck: normal, supple, no JVD appreciated. Respiratory:   Normal respiratory effort with  decreased overall aeration with some rales noted in the lower lung fields.  Currently on 2 L nasal cannula oxygen with O2 saturations maintained. Cardiovascular: Regular rate and rhythm.  No extremity edema.   Abdomen: no tenderness, no masses palpated. No hepatosplenomegaly. Bowel sounds positive.  Musculoskeletal: no clubbing / cyanosis. No joint deformity upper and lower extremities. Good ROM, no contractures. Normal muscle tone.  Skin: no rashes, lesions, ulcers. No induration Neurologic: CN 2-12 grossly intact. Sensation intact, DTR normal. Strength 5/5 in all 4.  Psychiatric: Normal judgment and insight. Alert and oriented x 3. Normal mood.   Data Reviewed:   Assessment and Plan: Sepsis diarrhea secondary to C. difficile infection Acute.  Patient presents with reports of fever up to 101 F at home with nausea, vomiting, and diarrhea.  Found to have WBC 17.2 and lactic acid 2->2.1.  CT did not note any acute abnormality of the abdomen or  pelvis, and similar appearance of his lungs.  C. difficile toxin and antigen are both noted to be positive.  Patient had just recently completed a course of Augmentin 3 days prior to onset of symptoms yesterday.  Patient had been started on [place] to a telemetry bed -Enteric precautions -Monitor intake and output -Continue Dificid twice daily -Probiotic  Renal insufficiency Patient baseline creatinine previously noted to be around 1, but presents with creatinine of 1.29 with BUN 18.  Urinalysis noted increased specific gravity and in the setting of reports of diarrhea dehydration.  Patient has been given 1 L normal saline IV fluids in the ED. -Normal saline IV fluids at 50 mL/h -Recheck kidney function tomorrow morning  History of recent aspiration pneumonia Patient just recently hospitalized for community-acquired/aspiration pneumonia of the right lower lobe 6/27-30 treated with Augmentin and discharge which was completed 4 days ago.  CT  imaging without clear signs of right-sided pneumonia at this time, but did note a left-sided collapse/infiltrate.  He was initially started on Rocephin and azithromycin.  He had been placed on oxygen in the ED, but was not documented to be hypoxic. -Continue nasal cannula oxygen and wean to room air as able -May need to reevaluate to ensure patient does not require oxygen on discharge. -Check procalcitonin -Reevaluate and determine need to continue antibiotics of Rocephin and azithromycin  Chronic diastolic CHF Chronic.  2D echo in February 2023 showed EF 60 -65% with grade 1 diastolic dysfunction.  Patient does not appear grossly fluid overloaded at this time on physical exam. -Daily weight -Monitor intake and output  Dysphagia  Daughter reports patient chronically gets choked up intermittently with has not changed.  Previously recommended a dysphagia 3 diet during last hospitalization after being evaluated by speech. -Aspiration precautions with elevation  -Dysphagia 3 diet with assistance  Suspected loculated pleural effusion Chronic.  Thoracentesis was attempted on 6/28 by ultrasound, but only trace amount of fluid was appreciated for which it was thought to be unsafe to proceed.  Dementia with behavioral disturbance During last hospitalization patient was noted to be acute with altered.  However, Haldol made the patient significantly lethargic for which family would like [place] to avoid use of this medication.  Seroquel was reported to be helpful. -Continue Aricept -Seroquel 25 mg twice daily  PAD -Continue Plavix  Depression -Continue Lexapro  Dyslipidemia Last lipid panel noted total cholesterol 133, HDL 39, LDL 68, and triglyceride 128. -Continue Zetia  Advance Care Planning:   Code Status: DNR   Consults: None  Family Communication: Daughter updated over the phone  Severity of Illness: The appropriate patient status for this patient is INPATIENT. Inpatient status is  judged to be reasonable and necessary in order to provide the required intensity of service to ensure the patient's safety. The patient's presenting symptoms, physical exam findings, and initial radiographic and laboratory data in the context of their chronic comorbidities is felt to place them at high risk for further clinical deterioration. Furthermore, it is not anticipated that the patient will be medically stable for discharge from the hospital within 2 midnights of admission.   * I certify that at the point of admission it is my clinical judgment that the patient will require inpatient hospital care spanning beyond 2 midnights from the point of admission due to high intensity of service, high risk for further deterioration and high frequency of surveillance required.*  Author: Norval Morton, MD 06/20/2022 12:30 PM  For on call review [email or web address].

## 2022-06-20 NOTE — TOC Benefit Eligibility Note (Signed)
Patient Advocate Encounter  Completed and sent [employer] application for DIFICD 932 mg tablets for this patient.   Medication will be mailed to patient's home  Lyndel Safe, [place] Patient [place] Patient Advocate Team Direct Number: [phone]  Fax: [phone]

## 2022-06-20 NOTE — ED Notes (Signed)
Patient transported to CT scan . 

## 2022-06-20 NOTE — ED Provider Notes (Signed)
[facility] EMERGENCY DEPARTMENT Provider Note   CSN: [id number] Arrival date & time: 06/20/22  0031     History  Chief Complaint  Patient presents with   Nausea   Emesis   Diarrhea    Paul [name] is a 88 y.o. male.  88 year old male who presents the ER today secondary to nausea vomiting diarrhea.  Patient was recently admitted to hospital for pneumonia but also had vomiting diarrhea that time.  Sounds like from the daughter they were concerned may be an aspiration event that caused all of this.  Reportedly had a fever at home of 101 today with some chills.  Patient is asymptomatic at this time but has dementia is not able to remember previous symptoms.   Emesis Associated symptoms: diarrhea   Diarrhea Associated symptoms: vomiting        Home Medications Prior to Admission medications   Medication Sig Start Date End Date Taking? Authorizing Provider  acetaminophen (TYLENOL) 500 MG tablet Take 500 mg by mouth daily as needed for mild pain or headache.    [provider]  clopidogrel (PLAVIX) 75 MG tablet Take 75 mg by mouth daily. 01/27/20   [provider]  donepezil (ARICEPT) 5 MG tablet Take 5 mg by mouth daily.    [provider]  escitalopram (LEXAPRO) 20 MG tablet Take 20 mg by mouth daily. 01/24/20   [provider]  ezetimibe (ZETIA) 10 MG tablet Take 10 mg by mouth daily.    [provider]  isosorbide mononitrate (IMDUR) 60 MG 24 hr tablet Take 1 tablet (60 mg total) by mouth every morning. do not give if systolic blood pressure < 130 Patient taking differently: Take 60 mg by mouth every morning. 02/01/22   Patrecia Pour, MD  simvastatin (ZOCOR) 20 MG tablet Take 20 mg by mouth every evening.  02/27/12  [provider]      Allergies    Livalo [pitavastatin], Livalo [pitavastatin], and Other    Review of Systems   Review of Systems  Gastrointestinal:  Positive for diarrhea and vomiting.     Physical Exam Updated Vital Signs BP (!) 125/58   Pulse 87   Temp (!) 100.7 F (38.2 C) (Rectal)   Resp 18   SpO2 92%  Physical Exam Vitals and nursing note reviewed.  Constitutional:      Appearance: He is well-developed.  HENT:     Head: Normocephalic and atraumatic.     Nose: No congestion or rhinorrhea.     Mouth/Throat:     Mouth: Mucous membranes are moist.     Pharynx: Oropharynx is clear.  Eyes:     Pupils: Pupils are equal, round, and reactive to light.  Cardiovascular:     Rate and Rhythm: Normal rate.  Pulmonary:     Effort: Pulmonary effort is normal. No respiratory distress.  Abdominal:     General: Abdomen is flat. There is no distension.  Musculoskeletal:        General: No swelling or tenderness. Normal range of motion.     Cervical back: Normal range of motion.  Skin:    General: Skin is warm and dry.  Neurological:     General: No focal deficit present.     Mental Status: He is alert.     ED Results / Procedures / Treatments   Labs (all labs ordered are listed, but only abnormal results are displayed) Labs Reviewed  LACTIC ACID, PLASMA - Abnormal; Notable for  the following components:      Result Value   Lactic Acid, Venous 2.0 (*)    All other components within normal limits  LACTIC ACID, PLASMA - Abnormal; Notable for the following components:   Lactic Acid, Venous 2.1 (*)    All other components within normal limits  COMPREHENSIVE METABOLIC PANEL - Abnormal; Notable for the following components:   Glucose, Bld 157 (*)    Creatinine, Ser 1.29 (*)    Total Protein 6.4 (*)    GFR, Estimated 53 (*)    All other components within normal limits  CBC WITH DIFFERENTIAL/PLATELET - Abnormal; Notable for the following components:   WBC 17.2 (*)    Hemoglobin 12.4 (*)    Neutro Abs 16.3 (*)    Lymphs Abs 0.3 (*)    Abs Immature Granulocytes 0.09 (*)    All other components within normal limits  APTT - Abnormal; Notable for the following  components:   aPTT <20 (*)    All other components within normal limits  CULTURE, BLOOD (ROUTINE X 2)  CULTURE, BLOOD (ROUTINE X 2)  URINE CULTURE  C DIFFICILE QUICK SCREEN W PCR REFLEX    GASTROINTESTINAL PANEL BY PCR, STOOL (REPLACES STOOL CULTURE)  PROTIME-INR  URINALYSIS, ROUTINE W REFLEX MICROSCOPIC    EKG EKG Interpretation  Date/Time:  Friday June 20 2022 00:37:55 EDT Ventricular Rate:  87 PR Interval:  179 QRS Duration: 87 QT Interval:  358 QTC Calculation: 431 R Axis:   -87 Text Interpretation: Sinus rhythm Left anterior fascicular block Probable anteroseptal infarct, old Confirmed by Merrily Pew [phone]) on 06/20/2022 12:41:06 AM  Radiology CT CHEST ABDOMEN PELVIS W CONTRAST  Result Date: 06/20/2022 CLINICAL DATA:  Nausea, vomiting, and diarrhea. History of pneumonia. Clinical concern for abscess. EXAM: CT CHEST, ABDOMEN, AND PELVIS WITH CONTRAST TECHNIQUE: Multidetector CT imaging of the chest, abdomen and pelvis was performed following the standard protocol during bolus administration of intravenous contrast. RADIATION DOSE REDUCTION: This exam was performed according to the departmental dose-optimization program which includes automated exposure control, adjustment of the mA and/or kV according to patient size and/or use of iterative reconstruction technique. CONTRAST:  167m OMNIPAQUE IOHEXOL 300 MG/ML  SOLN COMPARISON:  06/10/2022. FINDINGS: CT CHEST FINDINGS Cardiovascular: The heart is normal in size and there is a small pericardial effusion. Multi-vessel coronary artery calcifications are noted. There is atherosclerotic calcification of the aorta without evidence of aneurysm. The pulmonary trunk is normal in caliber. Mediastinum/Nodes: No mediastinal, hilar, or axillary lymphadenopathy. The thyroid gland, trachea, and esophagus are within normal limits. There is a small hiatal hernia. Lungs/Pleura: There is a moderate loculated pleural effusion on the left with thickening  of the adjacent pleura. There is left lower lobe consolidation and collapse with reduced lung volume. Centrilobular emphysema is noted in the lungs bilaterally. Bronchiectasis and atelectasis are present in the right lower lobe. No pneumothorax. No significant pulmonary nodule. Musculoskeletal: Degenerative changes in the thoracic spine. No acute osseous abnormality. CT ABDOMEN PELVIS FINDINGS Hepatobiliary: Stable subcentimeter hypodensities are present in the liver which are too small to further characterize. The gallbladder is without stones. No biliary ductal dilatation. Pancreas: Unremarkable. No pancreatic ductal dilatation or surrounding inflammatory changes. Spleen: Normal in size without focal abnormality. Adrenals/Urinary Tract: The adrenal glands are within normal limits. Renal cortical thinning or scarring is noted on the right. No renal calculus or hydronephrosis bilaterally. The bladder is unremarkable. Stomach/Bowel: Stomach is within normal limits. Appendix is not seen. No evidence of bowel wall  thickening, distention, or inflammatory changes. No free air or pneumatosis. Scattered diverticula are noted along the sigmoid colon without evidence of diverticulitis. Vascular/Lymphatic: Aortic atherosclerosis. No enlarged abdominal or pelvic lymph nodes. Reproductive: Enlarged prostate gland. Other: Fat containing inguinal hernias bilaterally. Small fat containing umbilical hernia. No ascites. Musculoskeletal: Degenerative changes are present in the lumbar spine in bilateral hips. No acute osseous abnormality. IMPRESSION: 1. Moderate loculated pleural effusion on the left with thickening of the pleura, concerning for empyema and not significantly changed from the prior exam. 2. Left lower lobe consolidation and collapse with reduced lung volume on the left, possible atelectasis, or infiltrate. The possibility of underlying neoplasm can not be completely excluded. 3. Emphysema. 4. No acute process in the  abdomen and pelvis. 5. Additional findings as described above. Electronically Signed   By: Brett Fairy M.D.   On: 06/20/2022 05:01   DG Chest Port 1 View  Result Date: 06/20/2022 CLINICAL DATA:  Questionable sepsis - evaluate for abnormality EXAM: PORTABLE CHEST 1 VIEW COMPARISON:  Chest radiograph 06/10/2022, CT 09/04/2021 FINDINGS: Left pleural effusion and basilar opacity without significant interval change. Elevated right hemidiaphragm with patchy basilar opacity. Stable heart size and mediastinal contours. No pulmonary edema. No pneumothorax. No acute osseous abnormalities are seen. IMPRESSION: 1. Left pleural effusion and basilar opacity without significant interval change. 2. Patchy right basilar opacity may be atelectasis or pneumonia. Electronically Signed   By: Keith Rake M.D.   On: 06/20/2022 01:30    Procedures Procedures    Medications Ordered in ED Medications  azithromycin (ZITHROMAX) 500 mg in sodium chloride 0.9 % 250 mL IVPB (500 mg Intravenous New Bag/Given 06/20/22 0648)  lactated ringers bolus 1,000 mL (0 mLs Intravenous Stopped 06/20/22 0519)  iohexol (OMNIPAQUE) 300 MG/ML solution 100 mL (100 mLs Intravenous Contrast Given 06/20/22 0438)  cefTRIAXone (ROCEPHIN) 2 g in sodium chloride 0.9 % 100 mL IVPB (0 g Intravenous Stopped 06/20/22 2094)    ED Course/ Medical Decision Making/ A&P                           Medical Decision Making Amount and/or Complexity of Data Reviewed Labs: ordered. Radiology: ordered. ECG/medicine tests: ordered.  Risk Prescription drug management. Decision regarding hospitalization.   Patient seems to be having an acute exacerbation of a chronic problem.  Consider multiple etiologies for the patient's symptoms.  After CT scan and blood test done as above worried he could have C. difficile versus similar type of bacterial GI infection related to recent hospitalization unfortunately not able to obtain stool sample yet.  Also concerned that  he has an empyema that has not been addressed secondary to difficulty with thoracentesis and his advanced age.  Possibly new/worsening pneumonia on CT scan compared to previous so we will go and start antibiotics after blood cultures that he was febrile, tachypneic and hypoxic.  CT scan chest abdomen pelvis done to evaluate for abscess but also could just have a new neoplasm in the lung that would explain his elevated temperature but not necessarily rest of his symptoms.  As it is pretty unclear he still requiring oxygen I discussed with hospitalist who will admit for further work-up.   Final Clinical Impression(s) / ED Diagnoses Final diagnoses:  Diarrhea, unspecified type    Rx / DC Orders ED Discharge Orders     None         Yon Schiffman, Corene Cornea, MD 06/20/22 [phone]

## 2022-06-20 NOTE — ED Notes (Addendum)
I have call 6n and asked for a purple amn

## 2022-06-20 NOTE — ED Notes (Signed)
Pt noted to be sleeping soundly.  Daughter provided w/ an update.  Daughter has requested the Pt not receive Haldol and only receive Seroquel, if he is agitated/not resting.  Pt's wife and daughter would like Pt to be seen my Pulmonology or referred to a Pulmonologist.  Sts he has been having issues with his lungs for an extended amount of time.

## 2022-06-21 DIAGNOSIS — R7881 Bacteremia: Secondary | ICD-10-CM

## 2022-06-21 DIAGNOSIS — A0472 Enterocolitis due to Clostridium difficile, not specified as recurrent: Secondary | ICD-10-CM | POA: Diagnosis not present

## 2022-06-21 DIAGNOSIS — J90 Pleural effusion, not elsewhere classified: Secondary | ICD-10-CM | POA: Diagnosis not present

## 2022-06-21 DIAGNOSIS — R1310 Dysphagia, unspecified: Secondary | ICD-10-CM | POA: Diagnosis not present

## 2022-06-21 DIAGNOSIS — A09 Infectious gastroenteritis and colitis, unspecified: Secondary | ICD-10-CM | POA: Diagnosis not present

## 2022-06-21 LAB — BLOOD CULTURE ID PANEL (REFLEXED) - BCID2

## 2022-06-21 LAB — BASIC METABOLIC PANEL
Anion gap: 9 (ref 5–15)
BUN: 17 mg/dL (ref 8–23)
CO2: 23 mmol/L (ref 22–32)
Calcium: 8.5 mg/dL — ABNORMAL LOW (ref 8.9–10.3)
Chloride: 108 mmol/L (ref 98–111)
Creatinine, Ser: 1.22 mg/dL (ref 0.61–1.24)
GFR, Estimated: 56 mL/min — ABNORMAL LOW (ref >60)
Glucose, Bld: 115 mg/dL — ABNORMAL HIGH (ref 70–99)
Potassium: 3.7 mmol/L (ref 3.5–5.1)
Sodium: 140 mmol/L (ref 135–145)

## 2022-06-21 LAB — URINE CULTURE: Culture: <10000 — AB

## 2022-06-21 LAB — CBC
HCT: 34.9 % — ABNORMAL LOW (ref 39.0–52.0)
Hemoglobin: 10.8 g/dL — ABNORMAL LOW (ref 13.0–17.0)
MCH: 27.8 pg (ref 26.0–34.0)
MCHC: 30.9 g/dL (ref 30.0–36.0)
MCV: 89.7 fL (ref 80.0–100.0)
Platelets: 292 10*3/uL (ref 150–400)
RBC: 3.89 MIL/uL — ABNORMAL LOW (ref 4.22–5.81)
RDW: 15.4 % (ref 11.5–15.5)
WBC: 12.6 10*3/uL — ABNORMAL HIGH (ref 4.0–10.5)
nRBC: 0 % (ref 0.0–0.2)

## 2022-06-21 LAB — PROCALCITONIN: Procalcitonin: 0.33 ng/mL

## 2022-06-21 MED ORDER — PANTOPRAZOLE SODIUM 40 MG PO TBEC
40.0000 mg | DELAYED_RELEASE_TABLET | Freq: Every day | ORAL | Status: DC
Start: 2022-06-21 — End: 2022-06-22
  Administered 2022-06-21 – 2022-06-22 (×2): 40 mg via ORAL
  Filled 2022-06-21 (×2): qty 1

## 2022-06-21 MED ORDER — QUETIAPINE FUMARATE 50 MG PO TABS
25.0000 mg | ORAL_TABLET | Freq: Every evening | ORAL | Status: DC
Start: 2022-06-21 — End: 2022-06-24
  Administered 2022-06-22 – 2022-06-23 (×2): 25 mg via ORAL
  Filled 2022-06-21 (×2): qty 1

## 2022-06-21 MED ORDER — CLOPIDOGREL BISULFATE 75 MG PO TABS
75.0000 mg | ORAL_TABLET | Freq: Every day | ORAL | Status: DC
  Administered 2022-06-21 – 2022-06-24 (×4): 75 mg via ORAL
  Filled 2022-06-21 (×4): qty 1

## 2022-06-21 MED ORDER — QUETIAPINE FUMARATE 50 MG PO TABS
25.0000 mg | ORAL_TABLET | Freq: Every day | ORAL | Status: DC | PRN
  Administered 2022-06-22: 25 mg via ORAL
  Filled 2022-06-21: qty 1

## 2022-06-21 MED ORDER — ENSURE ENLIVE PO LIQD
237.0000 mL | Freq: Two times a day (BID) | ORAL | Status: DC
  Administered 2022-06-21 – 2022-06-24 (×6): 237 mL via ORAL

## 2022-06-21 MED ORDER — PIPERACILLIN-TAZOBACTAM 3.375 G IVPB: 3.3750 g | Freq: Three times a day (TID) | INTRAVENOUS | Status: DC

## 2022-06-21 MED ORDER — PIPERACILLIN-TAZOBACTAM 3.375 G IVPB
3.3750 g | Freq: Three times a day (TID) | INTRAVENOUS | Status: DC
  Administered 2022-06-21 – 2022-06-24 (×9): 3.375 g via INTRAVENOUS
  Filled 2022-06-21 (×9): qty 50

## 2022-06-21 NOTE — Consult Note (Signed)
NAME:  Paul [name], MRN:  [id number], DOB:  [birth year], LOS: 1 ADMISSION DATE:  06/20/2022, CONSULTATION DATE:  06/21/2022 REFERRING MD:  Dr. Sabino Gasser, [facility], CHIEF COMPLAINT:  Pleural effusion   History of Present Illness:  88 yo male was in hospital from 6/27 to 6/30 with pneumonia.  He was noted to have small loculated Lt pleural effusion on imaging study.  Had ultrasound by IR, but fluid was felt to be too small for thoracentesis.  He returned to hospital on 7/07 with fever, nausea, vomiting and diarrhea.  Repeat imaging showed persistence of Lt pleural effusion.  PCCM asked to assess.  His daughter reports that he had mechanical fall in May of 2021.  He hit his Lt flank and chest, and had a hematoma.  CT abd/pelvis 04/24/20 that showed small Lt effusion with displaced posterior Lt 8th to 10th rib fractures.  Subsequent imaging studies have shown persistent of the small Lt pleural effusion.  He has persistent coughing while eating.  He had speech assessment during admission in June 2023 and recommended dysphagia 3 diet.  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  Denies chest pain.  Breathing comfortable on room air.    Objective   Blood pressure 123/66, pulse 66, temperature 98.4 F (36.9 C), temperature source Oral, resp. rate 16, height '5\' 7"'$  (1.702 m), weight 66.9 kg, SpO2 95 %.        Intake/Output Summary (Last 24 hours) at  06/21/2022 1100 Last data filed at 06/21/2022 0000 Gross per 24 hour  Intake 692.25 ml  Output --  Net 692.25 ml   Filed Weights   06/20/22 0924 06/21/22 0500  Weight: 75 kg 66.9 kg    Examination:  General - alert Eyes - pupils reactive ENT - no sinus tenderness, no stridor Cardiac - regular rate/rhythm, no murmur Chest - decreased BS Lt base Abdomen - soft, non tender, + bowel sounds Extremities - no cyanosis, clubbing, or edema Skin - no rashes Neuro - normal strength, moves extremities, follows commands Psych - normal mood and behavior  Assessment & Plan:   Chronic loculated Lt pleural effusion with compressive atelectasis of Lt lower lung. - this seems to be related to rib injuries after mechanical fall in May of 2021 - IR ultrasound from 06/11/22 showed effusion was too small for thoracentesis and doesn't appear to have progressed since thin - he is not having any respiratory symptoms at presents and maintaining SpO2 on room air - don't think the benefit of attempting thoracentesis outweighs the risk at this time, especially since his effusion seems chronic  - can repeat chest imaging if he develops worsening respiratory symptoms, Lt sided chest pain or increasing oxygen requirements and then decide if thoracentesis is needed  Dysphagia with recurrent aspiration. - D3 diet  C difficile colitis. - continue dificid  Pantoea agglomerans in blood culture. - uncertain about significance - per primary team  Goals of care. - seen by palliative care during hospitalization  in June 2023 - DNR/DNI  Updated his daughter at bedside  Please call if additional help needed while he is in hospital.  Labs   CBC: Recent Labs  Lab 06/20/22 0140 06/21/22 0105  WBC 17.2* 12.6*  NEUTROABS 16.3*  --   HGB 12.4* 10.8*  HCT 39.7 34.9*  MCV 89.6 89.7  PLT 372 510    Basic Metabolic Panel: Recent Labs  Lab 06/20/22 0140 06/21/22 0105  NA 139 140  K 5.1 3.7  CL 104 108   CO2 23 23  GLUCOSE 157* 115*  BUN 18 17  CREATININE 1.29* 1.22  CALCIUM 9.0 8.5*   GFR: Estimated Creatinine Clearance: 37.6 mL/min (by C-G formula based on SCr of 1.22 mg/dL). Recent Labs  Lab 06/20/22 0140 06/20/22 0315 06/21/22 0105  PROCALCITON  --   --  0.33  WBC 17.2*  --  12.6*  LATICACIDVEN 2.0* 2.1*  --     Liver Function Tests: Recent Labs  Lab 06/20/22 0140  AST 26  ALT 38  ALKPHOS 95  BILITOT 0.6  PROT 6.4*  ALBUMIN 3.5   No results for input(s): "LIPASE", "AMYLASE" in the last 168 hours. No results for input(s): "AMMONIA" in the last 168 hours.  ABG    Component Value Date/Time   TCO2 28 01/30/2022 1716     Coagulation Profile: Recent Labs  Lab 06/20/22 0140  INR 1.1    Cardiac Enzymes: No results for input(s): "CKTOTAL", "CKMB", "CKMBINDEX", "TROPONINI" in the last 168 hours.  HbA1C: Hgb A1c MFr Bld  Date/Time Value Ref Range Status  01/31/2022 02:50 AM 5.3 4.8 - 5.6 % Final    Comment:    (NOTE) Pre diabetes:          5.7%-6.4%  Diabetes:              >6.4%  Glycemic control for   <7.0% adults with diabetes     CBG: No results for input(s): "GLUCAP" in the last 168 hours.  Review of Systems:   Reviewed and negative  Past Medical History:  He,  has a past medical history of Back pain, Bladder tumor, BPH (benign prostatic hyperplasia), Hyperlipidemia, Hypertension, Impaired hearing (BILATERAL HEARING AIDS), and Personal history of gastric ulcer.   Surgical History:   Past Surgical History:  Procedure Laterality Date   APPENDECTOMY  1957   CATARACT EXTRACTION W/ INTRAOCULAR LENS  IMPLANT, BILATERAL     CYSTOSCOPY WITH BIOPSY  03/08/2012   Procedure: CYSTOSCOPY WITH BIOPSY;  Surgeon: Claybon Jabs, MD;  [facility];  Service: Urology;  Laterality: N/A;  gyrus   TONSILLECTOMY  CHILD   TRANSURETHRAL INCISION OF PROSTATE  03/08/2012   Procedure: TRANSURETHRAL INCISION OF THE PROSTATE (TUIP);  Surgeon:  Claybon Jabs, MD;  Location: [facility];  Service: Urology;  Laterality: N/A;   TRANSURETHRAL RESECTION OF BLADDER TUMOR  01/26/2012   Procedure: TRANSURETHRAL RESECTION OF BLADDER TUMOR (TURBT);  Surgeon: Claybon Jabs, MD;  Location: [facility];  Service: Urology;  Laterality: N/A;  GYRUS MYTOMICIN C      Social History:   reports that he quit smoking about 41 years ago. His smoking use included cigarettes. He has never used smokeless tobacco. He reports that he does not drink alcohol and does not use drugs.   Family History:  His family history includes Hypertension in his father; Kidney cancer in his mother.   Allergies Allergies  Allergen Reactions   Livalo [Pitavastatin]  Other (See Comments)    Cramping and back pain   Other Diarrhea and Other (See Comments)    Unnamed patch and tablets for dementia caused diarrhea (Possibly galantamine, from outside source)      Home Medications  Prior to Admission medications   Medication Sig Start Date End Date Taking? Authorizing Provider  acetaminophen (TYLENOL) 500 MG tablet Take 1,000 mg by mouth daily as needed for mild pain or headache.   Yes [provider]  clopidogrel (PLAVIX) 75 MG tablet Take 75 mg by mouth daily. 01/27/20  Yes [provider]  donepezil (ARICEPT) 5 MG tablet Take 5 mg by mouth daily.   Yes [provider]  escitalopram (LEXAPRO) 20 MG tablet Take 20 mg by mouth daily. 01/24/20  Yes [provider]  ezetimibe (ZETIA) 10 MG tablet Take 10 mg by mouth daily.   Yes [provider]  isosorbide mononitrate (IMDUR) 60 MG 24 hr tablet Take 1 tablet (60 mg total) by mouth every morning. do not give if systolic blood pressure < 130 Patient taking differently: Take 60 mg by mouth every morning. 02/01/22  Yes Patrecia Pour, MD  simvastatin (ZOCOR) 20 MG tablet Take 20 mg by mouth every evening.  02/27/12  [provider]     Signature:   Chesley Mires, MD [place] Pager - [phone] 06/21/2022, 11:24 AM

## 2022-06-21 NOTE — Progress Notes (Signed)
Progress Note    ATA PECHA   WNU:[id number]  DOB: 30-Oct-1930  DOA: 06/20/2022     1 PCP: Crist Infante, MD  Initial CC: fever, N/V, diarrhea  Hospital Course: [name] is a 88 year old male with PMH BPH, HLD, HTN, PUD, bladder tumor, BPH, dementia, CVA (February 0347), diastolic CHF who presented with diarrhea and nausea/vomiting.  Initial history was obtained from his daughter on admission due to his underlying dementia.  He had recently been hospitalized from 6/27 - 6/30 with CAP and possible aspiration.  He has a known left-sided loculated pleural effusion which was too small for thoracentesis during prior hospitalization. He was discharged home on Augmentin to complete course after being on antibiotics in the hospital as well.  He had completed Augmentin approximately 4 days prior to admission when he then began developing significant diarrhea and fevers. On work-up on admission he was found to be positive for C. difficile.  Blood cultures were also obtained on admission and 1/4 bottles were positive for Pantoea agglomerans.  He had been started on fidaxomicin for C. difficile on admission.  After blood cultures resulted, he was started on Zosyn.  Interval History:  Seen this morning with daughter present bedside as well.  He denied any abdominal pain.  Fevers had defervesced.  Still having ongoing diarrhea and was using the bedpan.  Assessment and Plan:  Sepsis due to diarrhea secondary to C. difficile infection -Etiology suspected due to recent antibiotic use.  Just completed Augmentin course as well prior to admission - Denies any abdominal pain -WBC down to 12.6 today from 17.2 on admission - Continue Pradaxa mycin - Continue C. difficile precautions - Continue probiotic  Gram-negative bacteremia - 1/4 bottles positive for Pantoea agglomerans on admission. Source unknown but given GNR, not likely to be a contaminate and will need to be treated - start on Zosyn for both  bacteremia and pulmonary coverage; can de-escalate if susceptibilities available.  We will also ask for ID input given atypical organism   Renal insufficiency Patient baseline creatinine previously noted to be around 1, but presents with creatinine of 1.29 with BUN 18.  Urinalysis noted increased specific gravity and in the setting of reports of diarrhea dehydration.  Patient has been given 1 L normal saline IV fluids in the ED. -Normal saline IV fluids at 50 mL/h -Continue trending BMP   History of recent aspiration pneumonia Left loculated pleural effusion - s/p CAP treatment last hospitalization with possible aspiration; completed course of Augmentin at home after discharge -Procalcitonin very mildly elevated, 0.33.  Continue trending - Started on Zosyn for now for further coverage to also treat bacteremia - Pulmonology consulted per family request as well, appreciate recommendations.  Effusion considered stable in size and still too small to be safe for thoracentesis   Chronic diastolic CHF Chronic.  2D echo in February 2023 showed EF 60 -65% with grade 1 diastolic dysfunction.  Patient does not appear grossly fluid overloaded at this time on physical exam. -Daily weight -Monitor intake and output   Dysphagia  - Daughter reports patient chronically gets choked up intermittently with has not changed.  - Previously recommended a dysphagia 3 diet during last hospitalization after being evaluated by speech. -Aspiration precautions with elevation  -Dysphagia 3 diet with assistance - appreciate SLP eval; will also trial PPI and see if helps sxms any   Dementia with behavioral disturbance During last hospitalization patient was noted to be acute with altered.  However, Haldol made the  patient significantly lethargic for which family would like [place] to avoid use of this medication.  Seroquel was reported to be helpful. -Continue Aricept -Seroquel 25 mg at night and 25 mg dailyPRN    PAD -Continue Plavix   Depression -Continue Lexapro   Dyslipidemia Last lipid panel noted total cholesterol 133, HDL 39, LDL 68, and triglyceride 128. -Continue Zetia   Old records reviewed in assessment of this patient  Antimicrobials: Rocephin and azithromycin, 06/20/2022 x 1 Fidaxomicin 7/7 >> current  Zosyn 7/7 >> current   DVT prophylaxis:  enoxaparin (LOVENOX) injection 40 mg Start: 06/20/22 1245   Code Status:   Code Status: DNR  Mobility Assessment (last 72 hours)     Mobility Assessment     Row Name 06/21/22 1100 06/20/22 2317         Does patient have an order for bedrest or is patient medically unstable No - Continue assessment No - Continue assessment      What is the highest level of mobility based on the progressive mobility assessment? Level 4 (Walks with assist in room) - Balance while marching in place and cannot step forward and back - Complete Level 4 (Walks with assist in room) - Balance while marching in place and cannot step forward and back - Complete      Is the above level different from baseline mobility prior to current illness? -- Yes - Recommend PT order               Disposition Plan:  Home in 2-3 days Status is: Inpt  Objective: Blood pressure (!) 134/51, pulse 63, temperature 98 F (36.7 C), temperature source Oral, resp. rate 16, height '5\' 7"'$  (1.702 m), weight 66.9 kg, SpO2 98 %.  Examination:  Physical Exam Constitutional:      General: He is not in acute distress.    Appearance: Normal appearance.  HENT:     Head: Normocephalic and atraumatic.     Mouth/Throat:     Mouth: Mucous membranes are moist.  Eyes:     Extraocular Movements: Extraocular movements intact.  Cardiovascular:     Rate and Rhythm: Normal rate and regular rhythm.     Heart sounds: Normal heart sounds.  Pulmonary:     Effort: Pulmonary effort is normal. No respiratory distress.     Breath sounds: Normal breath sounds. No wheezing.  Abdominal:      General: Bowel sounds are normal. There is no distension.     Palpations: Abdomen is soft.     Tenderness: There is no abdominal tenderness.  Musculoskeletal:        General: Normal range of motion.     Cervical back: Normal range of motion and neck supple.  Skin:    General: Skin is warm and dry.  Neurological:     Mental Status: He is alert. Mental status is at baseline.     Motor: No weakness.  Psychiatric:        Mood and Affect: Mood normal.        Behavior: Behavior normal.      Consultants:  ID Pulmonology   Procedures:    Data Reviewed: Results for orders placed or performed during the hospital encounter of 06/20/22 (from the past 24 hour(s))  CBC     Status: Abnormal   Collection Time: 06/21/22  1:05 AM  Result Value Ref Range   WBC 12.6 (H) 4.0 - 10.5 K/uL   RBC 3.89 (L) 4.22 - 5.81 MIL/uL  Hemoglobin 10.8 (L) 13.0 - 17.0 g/dL   HCT 34.9 (L) 39.0 - 52.0 %   MCV 89.7 80.0 - 100.0 fL   MCH 27.8 26.0 - 34.0 pg   MCHC 30.9 30.0 - 36.0 g/dL   RDW 15.4 11.5 - 15.5 %   Platelets 292 150 - 400 K/uL   nRBC 0.0 0.0 - 0.2 %  Basic metabolic panel     Status: Abnormal   Collection Time: 06/21/22  1:05 AM  Result Value Ref Range   Sodium 140 135 - 145 mmol/L   Potassium 3.7 3.5 - 5.1 mmol/L   Chloride 108 98 - 111 mmol/L   CO2 23 22 - 32 mmol/L   Glucose, Bld 115 (H) 70 - 99 mg/dL   BUN 17 8 - 23 mg/dL   Creatinine, Ser 1.22 0.61 - 1.24 mg/dL   Calcium 8.5 (L) 8.9 - 10.3 mg/dL   GFR, Estimated 56 (L) >60 mL/min   Anion gap 9 5 - 15  Procalcitonin - Baseline     Status: None   Collection Time: 06/21/22  1:05 AM  Result Value Ref Range   Procalcitonin 0.33 ng/mL    I have Reviewed nursing notes, Vitals, and Lab results since pt's last encounter. Pertinent lab results : see above I have ordered test including BMP, CBC, Mg I have reviewed the last note from staff over past 24 hours I have discussed pt's care plan and test results with nursing staff, case  manager   LOS: 1 day   Dwyane Dee, MD [facility] 06/21/2022, 6:14 PM

## 2022-06-21 NOTE — Evaluation (Signed)
Clinical/Bedside Swallow Evaluation Patient Details  Name: Paul [name] MRN: [id number] Date of Birth: Jul 21, 1930  Today's Date: 06/21/2022 Time: SLP Start Time (ACUTE ONLY): 1110 SLP Stop Time (ACUTE ONLY): 1139 SLP Time Calculation (min) (ACUTE ONLY): 29 min  Past Medical History:  Past Medical History:  Diagnosis Date   Back pain    Bladder tumor    BPH (benign prostatic hyperplasia)    Hyperlipidemia    Hypertension    Impaired hearing BILATERAL HEARING AIDS   Personal history of gastric ulcer    Past Surgical History:  Past Surgical History:  Procedure Laterality Date   APPENDECTOMY  1957   CATARACT EXTRACTION W/ INTRAOCULAR LENS  IMPLANT, BILATERAL     CYSTOSCOPY WITH BIOPSY  03/08/2012   Procedure: CYSTOSCOPY WITH BIOPSY;  Surgeon: Claybon Jabs, MD;  Location: [place];  Service: Urology;  Laterality: N/A;  gyrus   TONSILLECTOMY  CHILD   TRANSURETHRAL INCISION OF PROSTATE  03/08/2012   Procedure: TRANSURETHRAL INCISION OF THE PROSTATE (TUIP);  Surgeon: Claybon Jabs, MD;  Location[facility];  Service: Urology;  Laterality: N/A;   TRANSURETHRAL RESECTION OF BLADDER TUMOR  01/26/2012   Procedure: TRANSURETHRAL RESECTION OF BLADDER TUMOR (TURBT);  Surgeon: Claybon Jabs, MD;  Location: [facility];  Service: Urology;  Laterality: N/A;  GYRUS MYTOMICIN C    HPI:  88 year old male admitted with sepsis/nausea, vomitting and diarrhea due to C-diff. Patient just recently hospitalized for community-acquired/aspiration pneumonia of the right lower lobe 6/27-30 treated with Augmentin and discharge which was completed 4 days ago.  CT imaging without clear signs of right-sided pneumonia at this time, but did note a left-sided collapse/infiltrate. Per daughter, patient chronically gets choked when eating/drinking. Previous recommendations were for dysphagia 3 diet after being evaluated by SLP during last admission. MBS 06/11/22  with  "mild oropharyngeal phase dysphagia and primary structural dysphagia secondary to cervical osteophytes as well as prominent cricopharyngeal bar (both noted on MBS 2019). During today's study, patient exhibited swallow initiation delay with thin liquids to level of pyriform sinus and with puree solids, nectar thick liquids, mechanical soft solids, delay was to level of vallecular sinus. Only trace to mild vallecular residuals observed with tested boluses with full clearance with subsequent swallows. As compared to previous study, patient now exhibiting consistent collection of all consistencies of barium in suspected diverticulum above cricopharyngeal bar. Barium in this outpouching would clear but never fully, however SLP did not observe any increase in amount of barium collected. Chin tuck posture did not help to clear barium. No penetration or aspiration occured with any of the tested consistencies."    Assessment / Plan / Recommendation  Clinical Impression  Patient presents with clinical evidence of dysphagia consistent with most recent testing on 06/11/29.  Intermittent wet vocal quality (at baseline as well) with delayed throat clearing and coughing noted intermittently during po intake. Note that cough is strong and effective to clear vocal quality.  Patient with known osteophytes along cervical spine and CP bar both of which are resulting in dysphagia, increasing risk of aspiration. At this time, patient requiring moderate cueing for use of compensatory strategies which may increase safety wtih intake, primarily liquid wash following 1-2 bites of solids to aid in esophageal clearance and reduce change of backflow into airway. Daughter present for education provided. SLP suspicious for some degree of chronic GER impacting swallowing function. Patient does not report burning or pressure with po intake  however daughter reports chronic frequent coughing at night, hoarse voice, build up of pharyngeal mucous.  Patient has never been treated for GER per daughter. MD, wonder if trials of GER med would be beneficial. CP bar and anatomical changes will continue to be present however it may assist with swallowing function in some capacity. SLP will f/u to monitor for diet toleration and enforce use of strategies. Daughter is aware of aspiration risk. SLP Visit Diagnosis: Dysphagia, pharyngoesophageal phase (R13.14)    Aspiration Risk       Diet Recommendation Dysphagia 3 (Mech soft);Thin liquid   Liquid Administration via: Cup;Straw Medication Administration: Whole meds with puree Supervision: Patient able to self feed;Full supervision/cueing for compensatory strategies Compensations: Slow rate;Small sips/bites;Minimize environmental distractions Postural Changes: Seated upright at 90 degrees;Remain upright for at least 30 minutes after po intake    Other  Recommendations Oral Care Recommendations: Oral care BID    Recommendations for follow up therapy are one component of a multi-disciplinary discharge planning process, led by the attending physician.  Recommendations may be updated based on patient status, additional functional criteria and insurance authorization.  Assistance Recommended at Discharge Frequent or constant Supervision/Assistance     Frequency and Duration min 2x/week  1 week        Swallow Study   General HPI: 88 year old male admitted with sepsis/nausea, vomitting and diarrhea due to C-diff. Patient just recently hospitalized for community-acquired/aspiration pneumonia of the right lower lobe 6/27-30 treated with Augmentin and discharge which was completed 4 days ago.  CT imaging without clear signs of right-sided pneumonia at this time, but did note a left-sided collapse/infiltrate. Per daughter, patient chronically gets choked when eating/drinking. Previous recommendations were for dysphagia 3 diet after being evaluated by SLP during last admission. MBS 06/11/22  with "mild  oropharyngeal phase dysphagia and primary structural dysphagia secondary to cervical osteophytes as well as prominent cricopharyngeal bar (both noted on MBS 2019). During today's study, patient exhibited swallow initiation delay with thin liquids to level of pyriform sinus and with puree solids, nectar thick liquids, mechanical soft solids, delay was to level of vallecular sinus. Only trace to mild vallecular residuals observed with tested boluses with full clearance with subsequent swallows. As compared to previous study, patient now exhibiting consistent collection of all consistencies of barium in suspected diverticulum above cricopharyngeal bar. Barium in this outpouching would clear but never fully, however SLP did not observe any increase in amount of barium collected. Chin tuck posture did not help to clear barium. No penetration or aspiration occured with any of the tested consistencies." Type of Study: Bedside Swallow Evaluation Previous Swallow Assessment: see HPI Diet Prior to this Study: Dysphagia 3 (soft);Thin liquids Temperature Spikes Noted: No Respiratory Status: Nasal cannula History of Recent Intubation: No Behavior/Cognition: Cooperative;Pleasant mood;Alert Oral Cavity Assessment: Within Functional Limits Oral Care Completed by SLP: No Oral Cavity - Dentition: Dentures, top;Dentures, bottom Vision: Functional for self-feeding Self-Feeding Abilities: Able to feed self Patient Positioning: Upright in bed Baseline Vocal Quality: Wet (quickly cleared with spontaneous cough/throat clear) Volitional Cough: Strong Volitional Swallow: Able to elicit    Oral/Motor/Sensory Function Overall Oral Motor/Sensory Function: Within functional limits   Ice Chips Ice chips: Not tested   Thin Liquid Thin Liquid: Impaired Presentation: Cup;Straw;Self Fed Pharyngeal  Phase Impairments: Wet Vocal Quality;Cough - Delayed    Nectar Thick Nectar Thick Liquid: Not tested   Honey Thick Honey Thick  Liquid: Not tested   Puree Puree: Impaired Presentation: Spoon;Self Fed Pharyngeal Phase  Impairments: Wet Vocal Quality;Cough - Delayed   Solid     Solid: Impaired Presentation: Self Fed;Spoon Pharyngeal Phase Impairments: Cough - Delayed     Wood Novacek MA, CCC-SLP  Braxton Weisbecker Meryl 06/21/2022,11:46 AM

## 2022-06-21 NOTE — Progress Notes (Signed)
PHARMACY - PHYSICIAN COMMUNICATION CRITICAL VALUE ALERT - BLOOD CULTURE IDENTIFICATION (BCID)  Paul [name] is an 88 y.o. male who presented to [facility] on 06/20/2022 with a chief complaint of diarrhea with nausea and vomiting  Assessment:  1 of 4 Enterobacterales  Name of physician (or Provider) Contacted: Hollace Hayward NP  Current antibiotics: none, Ceftriaxone given x1 in the ED.   Changes to prescribed antibiotics recommended: add gram (-) coverage Recommendations declined by provider due to multiple possible sources of infection - wishes to order additional cultures  Results for orders placed or performed during the hospital encounter of 06/20/22  Blood Culture ID Panel (Reflexed) (Collected: 06/20/2022  1:40 AM)  Result Value Ref Range   Enterococcus faecalis NOT DETECTED NOT DETECTED   Enterococcus Faecium NOT DETECTED NOT DETECTED   Listeria monocytogenes NOT DETECTED NOT DETECTED   Staphylococcus species NOT DETECTED NOT DETECTED   Staphylococcus aureus (BCID) NOT DETECTED NOT DETECTED   Staphylococcus epidermidis NOT DETECTED NOT DETECTED   Staphylococcus lugdunensis NOT DETECTED NOT DETECTED   Streptococcus species NOT DETECTED NOT DETECTED   Streptococcus agalactiae NOT DETECTED NOT DETECTED   Streptococcus pneumoniae NOT DETECTED NOT DETECTED   Streptococcus pyogenes NOT DETECTED NOT DETECTED   A.calcoaceticus-baumannii NOT DETECTED NOT DETECTED   Bacteroides fragilis NOT DETECTED NOT DETECTED   Enterobacterales DETECTED (A) NOT DETECTED   Enterobacter cloacae complex NOT DETECTED NOT DETECTED   Escherichia coli NOT DETECTED NOT DETECTED   Klebsiella aerogenes NOT DETECTED NOT DETECTED   Klebsiella oxytoca NOT DETECTED NOT DETECTED   Klebsiella pneumoniae NOT DETECTED NOT DETECTED   Proteus species NOT DETECTED NOT DETECTED   Salmonella species NOT DETECTED NOT DETECTED   Serratia marcescens NOT DETECTED NOT DETECTED   Haemophilus influenzae NOT DETECTED NOT  DETECTED   Neisseria meningitidis NOT DETECTED NOT DETECTED   Pseudomonas aeruginosa NOT DETECTED NOT DETECTED   Stenotrophomonas maltophilia NOT DETECTED NOT DETECTED   Candida albicans NOT DETECTED NOT DETECTED   Candida auris NOT DETECTED NOT DETECTED   Candida glabrata NOT DETECTED NOT DETECTED   Candida krusei NOT DETECTED NOT DETECTED   Candida parapsilosis NOT DETECTED NOT DETECTED   Candida tropicalis NOT DETECTED NOT DETECTED   Cryptococcus neoformans/gattii NOT DETECTED NOT DETECTED   CTX-M ESBL NOT DETECTED NOT DETECTED   Carbapenem resistance IMP NOT DETECTED NOT DETECTED   Carbapenem resistance KPC NOT DETECTED NOT DETECTED   Carbapenem resistance NDM NOT DETECTED NOT DETECTED   Carbapenem resist OXA 48 LIKE NOT DETECTED NOT DETECTED   Carbapenem resistance VIM NOT DETECTED NOT DETECTED    Nicole Kindred L Nakai Pollio 06/21/2022  2:20 AM

## 2022-06-21 NOTE — Plan of Care (Signed)
  Problem: Education: Goal: Knowledge of General Education information will improve Description Including pain rating scale, medication(s)/side effects and non-pharmacologic comfort measures Outcome: Progressing   Problem: Health Behavior/Discharge Planning: Goal: Ability to manage health-related needs will improve Outcome: Progressing   

## 2022-06-21 NOTE — Hospital Course (Signed)
Paul [name] is a 88 year old male with PMH BPH, HLD, HTN, PUD, bladder tumor, BPH, dementia, CVA (February 3419), diastolic CHF who presented with diarrhea and nausea/vomiting.  Initial history was obtained from his daughter on admission due to his underlying dementia.  He had recently been hospitalized from 6/27 - 6/30 with CAP and possible aspiration.  He has a known left-sided loculated pleural effusion which was too small for thoracentesis during prior hospitalization. He was discharged home on Augmentin to complete course after being on antibiotics in the hospital as well.  He had completed Augmentin approximately 4 days prior to admission when he then began developing significant diarrhea and fevers. On work-up on admission he was found to be positive for C. difficile.  Blood cultures were also obtained on admission and 1/4 bottles were positive for Pantoea agglomerans.  He had been started on fidaxomicin for C. difficile on admission.  After blood cultures resulted, he was started on Zosyn. He underwent evaluation by ID during hospitalization. He was recommended to de-escalate to Bactrim to complete course with ongoing 1 week fidaxomicin after Bactrim completion.

## 2022-06-22 DIAGNOSIS — R7881 Bacteremia: Secondary | ICD-10-CM | POA: Diagnosis not present

## 2022-06-22 DIAGNOSIS — A0472 Enterocolitis due to Clostridium difficile, not specified as recurrent: Secondary | ICD-10-CM | POA: Diagnosis not present

## 2022-06-22 LAB — CBC WITH DIFFERENTIAL/PLATELET
Abs Immature Granulocytes: 0.04 10*3/uL (ref 0.00–0.07)
Basophils Absolute: 0 10*3/uL (ref 0.0–0.1)
Basophils Relative: 0 %
Eosinophils Absolute: 0.1 10*3/uL (ref 0.0–0.5)
Eosinophils Relative: 1 %
HCT: 30.6 % — ABNORMAL LOW (ref 39.0–52.0)
Hemoglobin: 10 g/dL — ABNORMAL LOW (ref 13.0–17.0)
Immature Granulocytes: 1 %
Lymphocytes Relative: 11 %
Lymphs Abs: 1 10*3/uL (ref 0.7–4.0)
MCH: 28.7 pg (ref 26.0–34.0)
MCHC: 32.7 g/dL (ref 30.0–36.0)
MCV: 87.9 fL (ref 80.0–100.0)
Monocytes Absolute: 0.9 10*3/uL (ref 0.1–1.0)
Monocytes Relative: 10 %
Neutro Abs: 6.6 10*3/uL (ref 1.7–7.7)
Neutrophils Relative %: 77 %
Platelets: 286 10*3/uL (ref 150–400)
RBC: 3.48 MIL/uL — ABNORMAL LOW (ref 4.22–5.81)
RDW: 15.1 % (ref 11.5–15.5)
WBC: 8.7 10*3/uL (ref 4.0–10.5)
nRBC: 0 % (ref 0.0–0.2)

## 2022-06-22 LAB — MAGNESIUM: Magnesium: 1.9 mg/dL (ref 1.7–2.4)

## 2022-06-22 LAB — BASIC METABOLIC PANEL
Anion gap: 11 (ref 5–15)
BUN: 21 mg/dL (ref 8–23)
CO2: 23 mmol/L (ref 22–32)
Calcium: 8.5 mg/dL — ABNORMAL LOW (ref 8.9–10.3)
Chloride: 106 mmol/L (ref 98–111)
Creatinine, Ser: 1.27 mg/dL — ABNORMAL HIGH (ref 0.61–1.24)
GFR, Estimated: 54 mL/min — ABNORMAL LOW (ref >60)
Glucose, Bld: 122 mg/dL — ABNORMAL HIGH (ref 70–99)
Potassium: 4.1 mmol/L (ref 3.5–5.1)
Sodium: 140 mmol/L (ref 135–145)

## 2022-06-22 LAB — PROCALCITONIN: Procalcitonin: 0.19 ng/mL

## 2022-06-22 LAB — PHOSPHORUS: Phosphorus: 2.5 mg/dL (ref 2.5–4.6)

## 2022-06-22 MED ORDER — POTASSIUM PHOSPHATES 15 MMOLE/5ML IV SOLN
15.0000 mmol | Freq: Once | INTRAVENOUS | Status: AC
  Administered 2022-06-22: 15 mmol via INTRAVENOUS
  Filled 2022-06-22: qty 5

## 2022-06-22 NOTE — Progress Notes (Signed)
Initial Nutrition Assessment  DOCUMENTATION CODES:   Not applicable  INTERVENTION:   Continue Ensure Enlive po BID, each supplement provides 350 kcal and 20 grams of protein.  Continue Probitoic  Continue liberalized diet; consistency per SLP/MD   NUTRITION DIAGNOSIS:   Inadequate oral intake related to dysphagia, acute illness as evidenced by per patient/family report.  GOAL:   Patient will meet greater than or equal to 90% of their needs  MONITOR:   PO intake, Supplement acceptance, Weight trends, Labs  REASON FOR ASSESSMENT:   Malnutrition Screening Tool    ASSESSMENT:   88 yo admitted with sepsis and N/V/D due to C.dff infection. Pt with recent hospitalization for community acquired/aspiration pneumonia from 6/27-6/30. PMH includes HTN, HLD, bladder tumor, impaired hearing with bilateral hearing aids, chronic dysphagia, dementia.  Pt evaluated by SLP yesterday. Daughter reporting pt chronically gets choked when eating and drinking. SLP recommended Dysphagia 3 with Thin Liquids. Noted plan to trial PPI as well  No recorded po intake. Unable to reach via phone today. RD to obtain diet and weight history on follow-up as able.   Inpatient RD evaluated pt on last admission on 06/11/22 and pt with good appetite, eating well with no wt loss at that time.   Current wt 66.9 kg; if weight is accurate, no recent wt loss per weight encounters.   Labs: potassium 4.1 (wdl), phosphorus 2.5 (wdl), Creatinine 1.27, BUN wdl Meds: Dificid, probiotic, potassium phosphate, protonix, IV zosyn   NUTRITION - FOCUSED PHYSICAL EXAM:  Unable to assess  Diet Order:   Diet Order             DIET DYS 3 Room service appropriate? Yes with Assist; Fluid consistency: Thin  Diet effective now                   EDUCATION NEEDS:   Not appropriate for education at this time  Skin:  Skin Assessment: Reviewed RN Assessment  Last BM:  7/8 type 7 large  Height:   Ht Readings from  Last 1 Encounters:  06/20/22 '5\' 7"'$  (1.702 m)    Weight:   Wt Readings from Last 1 Encounters:  06/21/22 66.9 kg    BMI:  Body mass index is 23.1 kg/m.  Estimated Nutritional Needs:   Kcal:  1600-1800 kcals  Protein:  80-90 g  Fluid:  >/= 1.7 L   Kerman Passey MS, RDN, LDN, CNSC Registered Dietitian 3 Clinical Nutrition RD Pager and On-Call Pager Number Located in [place]

## 2022-06-22 NOTE — Plan of Care (Signed)
  Problem: Nutrition: Goal: Adequate nutrition will be maintained Outcome: Progressing   Problem: Pain Managment: Goal: General experience of comfort will improve Outcome: Progressing   Problem: Safety: Goal: Ability to remain free from injury will improve Outcome: Progressing   

## 2022-06-22 NOTE — Progress Notes (Signed)
Progress Note    Paul [name]   LEX:[id number]  DOB: 30-Nov-1930  DOA: 06/20/2022     2 PCP: Crist Infante, MD  Initial CC: fever, N/V, diarrhea  Hospital Course: Paul [name] is a 88 year old male with PMH BPH, HLD, HTN, PUD, bladder tumor, BPH, dementia, CVA (February 4496), diastolic CHF who presented with diarrhea and nausea/vomiting.  Initial history was obtained from his daughter on admission due to his underlying dementia.  He had recently been hospitalized from 6/27 - 6/30 with CAP and possible aspiration.  He has a known left-sided loculated pleural effusion which was too small for thoracentesis during prior hospitalization. He was discharged home on Augmentin to complete course after being on antibiotics in the hospital as well.  He had completed Augmentin approximately 4 days prior to admission when he then began developing significant diarrhea and fevers. On work-up on admission he was found to be positive for C. difficile.  Blood cultures were also obtained on admission and 1/4 bottles were positive for Pantoea agglomerans.  He had been started on fidaxomicin for C. difficile on admission.  After blood cultures resulted, he was started on Zosyn.  Interval History:  Seen this morning with daughter present bedside as well.  States he is feeling better today in terms of energy and just overall.  Feels that his diarrhea is starting to transition a little bit to taking form. Eating okay too. Otherwise had no concerns this morning.   Assessment and Plan:  Sepsis due to diarrhea secondary to C. difficile infection -Etiology suspected due to recent antibiotic use.  Just completed Augmentin course as well prior to admission - Denies any abdominal pain -WBC down to 8.6 today from 17.2 on admission - Continue fidaxomicin - Continue C. difficile precautions - Continue probiotic  Gram-negative bacteremia - 1/4 bottles positive for Pantoea agglomerans on admission. Source unknown but  given GNR, not likely to be a contaminate and will need to be treated - start on Zosyn for both bacteremia and pulmonary coverage; can de-escalate if susceptibilities available.  We will also ask for ID input given atypical organism   Renal insufficiency Patient baseline creatinine previously noted to be around 1, but presents with creatinine of 1.29 with BUN 18.  Urinalysis noted increased specific gravity and in the setting of reports of diarrhea dehydration.  Patient has been given 1 L normal saline IV fluids in the ED. -s/p IVF -Continue trending BMP   History of recent aspiration pneumonia Left loculated pleural effusion - s/p CAP treatment last hospitalization with possible aspiration; completed course of Augmentin at home after discharge -Procalcitonin very mildly elevated, 0.33.  Continue trending - Started on Zosyn for now for further coverage to also treat bacteremia - Pulmonology consulted per family request as well, appreciate recommendations.  Effusion considered stable in size and still too small to be safe for thoracentesis   Chronic diastolic CHF Chronic.  2D echo in February 2023 showed EF 60 -65% with grade 1 diastolic dysfunction.  Patient does not appear grossly fluid overloaded at this time on physical exam. -Daily weight -Monitor intake and output   Dysphagia  - Daughter reports patient chronically gets choked up intermittently with has not changed.  - Previously recommended a dysphagia 3 diet during last hospitalization after being evaluated by speech. -Aspiration precautions with elevation  -Dysphagia 3 diet with assistance - appreciate SLP eval; PPI discontinued after discussion cdiff risk with daughter    Dementia with behavioral disturbance During last hospitalization patient  was noted to be acute with altered.  However, Haldol made the patient significantly lethargic for which family would like [place] to avoid use of this medication.  Seroquel was reported to be  helpful. -Continue Aricept -Seroquel 25 mg at night (okay if not needed/declined) and 25 mg dailyPRN   PAD -Continue Plavix   Depression -Continue Lexapro   Dyslipidemia Last lipid panel noted total cholesterol 133, HDL 39, LDL 68, and triglyceride 128. -Continue Zetia   Old records reviewed in assessment of this patient  Antimicrobials: Rocephin and azithromycin, 06/20/2022 x 1 Fidaxomicin 7/7 >> current  Zosyn 7/7 >> current   DVT prophylaxis:  enoxaparin (LOVENOX) injection 40 mg Start: 06/20/22 1245   Code Status:   Code Status: DNR  Mobility Assessment (last 72 hours)     Mobility Assessment     Row Name 06/22/22 0747 06/21/22 1914 06/21/22 1100 06/20/22 2317     Does patient have an order for bedrest or is patient medically unstable No - Continue assessment No - Continue assessment No - Continue assessment No - Continue assessment    What is the highest level of mobility based on the progressive mobility assessment? Level 4 (Walks with assist in room) - Balance while marching in place and cannot step forward and back - Complete Level 4 (Walks with assist in room) - Balance while marching in place and cannot step forward and back - Complete Level 4 (Walks with assist in room) - Balance while marching in place and cannot step forward and back - Complete Level 4 (Walks with assist in room) - Balance while marching in place and cannot step forward and back - Complete    Is the above level different from baseline mobility prior to current illness? Yes - Recommend PT order -- -- Yes - Recommend PT order             Disposition Plan:  Home in 2-3 days Status is: Inpt  Objective: Blood pressure (!) 115/51, pulse (!) 57, temperature 98.1 F (36.7 C), temperature source Oral, resp. rate 17, height '5\' 7"'$  (1.702 m), weight 66.9 kg, SpO2 96 %.  Examination:  Physical Exam Constitutional:      General: He is not in acute distress.    Appearance: Normal appearance.  HENT:      Head: Normocephalic and atraumatic.     Mouth/Throat:     Mouth: Mucous membranes are moist.  Eyes:     Extraocular Movements: Extraocular movements intact.  Cardiovascular:     Rate and Rhythm: Normal rate and regular rhythm.     Heart sounds: Normal heart sounds.  Pulmonary:     Effort: Pulmonary effort is normal. No respiratory distress.     Breath sounds: Normal breath sounds. No wheezing.  Abdominal:     General: Bowel sounds are normal. There is no distension.     Palpations: Abdomen is soft.     Tenderness: There is no abdominal tenderness.  Musculoskeletal:        General: Normal range of motion.     Cervical back: Normal range of motion and neck supple.  Skin:    General: Skin is warm and dry.  Neurological:     Mental Status: He is alert. Mental status is at baseline.     Motor: No weakness.  Psychiatric:        Mood and Affect: Mood normal.        Behavior: Behavior normal.      Consultants:  ID  Pulmonology   Procedures:    Data Reviewed: Results for orders placed or performed during the hospital encounter of 06/20/22 (from the past 24 hour(s))  Procalcitonin     Status: None   Collection Time: 06/22/22  1:03 AM  Result Value Ref Range   Procalcitonin 0.19 ng/mL  Basic metabolic panel     Status: Abnormal   Collection Time: 06/22/22  1:03 AM  Result Value Ref Range   Sodium 140 135 - 145 mmol/L   Potassium 4.1 3.5 - 5.1 mmol/L   Chloride 106 98 - 111 mmol/L   CO2 23 22 - 32 mmol/L   Glucose, Bld 122 (H) 70 - 99 mg/dL   BUN 21 8 - 23 mg/dL   Creatinine, Ser 1.27 (H) 0.61 - 1.24 mg/dL   Calcium 8.5 (L) 8.9 - 10.3 mg/dL   GFR, Estimated 54 (L) >60 mL/min   Anion gap 11 5 - 15  CBC with Differential/Platelet     Status: Abnormal   Collection Time: 06/22/22  1:03 AM  Result Value Ref Range   WBC 8.7 4.0 - 10.5 K/uL   RBC 3.48 (L) 4.22 - 5.81 MIL/uL   Hemoglobin 10.0 (L) 13.0 - 17.0 g/dL   HCT 30.6 (L) 39.0 - 52.0 %   MCV 87.9 80.0 - 100.0 fL    MCH 28.7 26.0 - 34.0 pg   MCHC 32.7 30.0 - 36.0 g/dL   RDW 15.1 11.5 - 15.5 %   Platelets 286 150 - 400 K/uL   nRBC 0.0 0.0 - 0.2 %   Neutrophils Relative % 77 %   Neutro Abs 6.6 1.7 - 7.7 K/uL   Lymphocytes Relative 11 %   Lymphs Abs 1.0 0.7 - 4.0 K/uL   Monocytes Relative 10 %   Monocytes Absolute 0.9 0.1 - 1.0 K/uL   Eosinophils Relative 1 %   Eosinophils Absolute 0.1 0.0 - 0.5 K/uL   Basophils Relative 0 %   Basophils Absolute 0.0 0.0 - 0.1 K/uL   Immature Granulocytes 1 %   Abs Immature Granulocytes 0.04 0.00 - 0.07 K/uL  Magnesium     Status: None   Collection Time: 06/22/22  1:03 AM  Result Value Ref Range   Magnesium 1.9 1.7 - 2.4 mg/dL  Phosphorus     Status: None   Collection Time: 06/22/22  1:03 AM  Result Value Ref Range   Phosphorus 2.5 2.5 - 4.6 mg/dL    I have Reviewed nursing notes, Vitals, and Lab results since pt's last encounter. Pertinent lab results : see above I have ordered test including BMP, CBC, Mg I have reviewed the last note from staff over past 24 hours I have discussed pt's care plan and test results with nursing staff, case manager   LOS: 2 days   Dwyane Dee, MD [facility] 06/22/2022, 3:56 PM

## 2022-06-23 DIAGNOSIS — R1310 Dysphagia, unspecified: Secondary | ICD-10-CM | POA: Diagnosis not present

## 2022-06-23 DIAGNOSIS — A0472 Enterocolitis due to Clostridium difficile, not specified as recurrent: Secondary | ICD-10-CM | POA: Diagnosis not present

## 2022-06-23 DIAGNOSIS — F03918 Unspecified dementia, unspecified severity, with other behavioral disturbance: Secondary | ICD-10-CM | POA: Diagnosis not present

## 2022-06-23 DIAGNOSIS — R7881 Bacteremia: Secondary | ICD-10-CM | POA: Diagnosis not present

## 2022-06-23 LAB — CBC WITH DIFFERENTIAL/PLATELET
Abs Immature Granulocytes: 0.01 10*3/uL (ref 0.00–0.07)
Basophils Absolute: 0 10*3/uL (ref 0.0–0.1)
Basophils Relative: 0 %
Eosinophils Absolute: 0.2 10*3/uL (ref 0.0–0.5)
Eosinophils Relative: 3 %
HCT: 32.5 % — ABNORMAL LOW (ref 39.0–52.0)
Hemoglobin: 10.3 g/dL — ABNORMAL LOW (ref 13.0–17.0)
Immature Granulocytes: 0 %
Lymphocytes Relative: 14 %
Lymphs Abs: 0.7 10*3/uL (ref 0.7–4.0)
MCH: 28.5 pg (ref 26.0–34.0)
MCHC: 31.7 g/dL (ref 30.0–36.0)
MCV: 89.8 fL (ref 80.0–100.0)
Monocytes Absolute: 0.5 10*3/uL (ref 0.1–1.0)
Monocytes Relative: 9 %
Neutro Abs: 3.7 10*3/uL (ref 1.7–7.7)
Neutrophils Relative %: 74 %
Platelets: 303 10*3/uL (ref 150–400)
RBC: 3.62 MIL/uL — ABNORMAL LOW (ref 4.22–5.81)
RDW: 14.9 % (ref 11.5–15.5)
WBC: 5 10*3/uL (ref 4.0–10.5)
nRBC: 0 % (ref 0.0–0.2)

## 2022-06-23 LAB — BASIC METABOLIC PANEL
Anion gap: 9 (ref 5–15)
BUN: 19 mg/dL (ref 8–23)
CO2: 21 mmol/L — ABNORMAL LOW (ref 22–32)
Calcium: 8.4 mg/dL — ABNORMAL LOW (ref 8.9–10.3)
Chloride: 108 mmol/L (ref 98–111)
Creatinine, Ser: 1.01 mg/dL (ref 0.61–1.24)
GFR, Estimated: >60 mL/min (ref >60)
Glucose, Bld: 119 mg/dL — ABNORMAL HIGH (ref 70–99)
Potassium: 3.8 mmol/L (ref 3.5–5.1)
Sodium: 138 mmol/L (ref 135–145)

## 2022-06-23 LAB — CULTURE, BLOOD (ROUTINE X 2): Special Requests: ADEQUATE

## 2022-06-23 LAB — MAGNESIUM: Magnesium: 1.9 mg/dL (ref 1.7–2.4)

## 2022-06-23 LAB — PROCALCITONIN: Procalcitonin: <0.1 ng/mL

## 2022-06-23 LAB — PHOSPHORUS: Phosphorus: 2.8 mg/dL (ref 2.5–4.6)

## 2022-06-23 NOTE — Progress Notes (Signed)
Speech Language Pathology Treatment: Dysphagia  Patient Details Name: Paul [name] MRN: [id number] DOB: August 03, 1930 Today's Date: 06/23/2022 Time: 0156-1537 SLP Time Calculation (min) (ACUTE ONLY): 15 min  Assessment / Plan / Recommendation Clinical Impression  Visited daughter at bedside, pt sleeping. Daughter reported some concern that cut up food bother the pts. We reviewed what kind of foods pt would be served at home and the importance of making the food moist and soft , more than cut up. Pt should be encouraged to eat slowly and follow bites with sips. But if pt wants to cut up his own food that is fine. We also discussed reflux precautions, particularly the benefits of sleeping with head slightly elevated on a wedge or raised bed. Education complete at this time. Will sign off.   HPI HPI: 88 year old male admitted with sepsis/nausea, vomitting and diarrhea due to C-diff. Patient just recently hospitalized for community-acquired/aspiration pneumonia of the right lower lobe 6/27-30 treated with Augmentin and discharge which was completed 4 days ago.  CT imaging without clear signs of right-sided pneumonia at this time, but did note a left-sided collapse/infiltrate. Per daughter, patient chronically gets choked when eating/drinking. Previous recommendations were for dysphagia 3 diet after being evaluated by SLP during last admission. MBS 06/11/22  with "mild oropharyngeal phase dysphagia and primary structural dysphagia secondary to cervical osteophytes as well as prominent cricopharyngeal bar (both noted on MBS 2019). During today's study, patient exhibited swallow initiation delay with thin liquids to level of pyriform sinus and with puree solids, nectar thick liquids, mechanical soft solids, delay was to level of vallecular sinus. Only trace to mild vallecular residuals observed with tested boluses with full clearance with subsequent swallows. As compared to previous study, patient now exhibiting  consistent collection of all consistencies of barium in suspected diverticulum above cricopharyngeal bar. Barium in this outpouching would clear but never fully, however SLP did not observe any increase in amount of barium collected. Chin tuck posture did not help to clear barium. No penetration or aspiration occured with any of the tested consistencies."      SLP Plan  All goals met      Recommendations for follow up therapy are one component of a multi-disciplinary discharge planning process, led by the attending physician.  Recommendations may be updated based on patient status, additional functional criteria and insurance authorization.    Recommendations  Diet recommendations: Dysphagia 3 (mechanical soft);Thin liquid Liquids provided via: Cup;Straw Medication Administration: Whole meds with puree Supervision: Patient able to self feed Compensations: Slow rate;Small sips/bites;Minimize environmental distractions Postural Changes and/or Swallow Maneuvers: Seated upright 90 degrees;Upright 30-60 min after meal                Oral Care Recommendations: Oral care BID Follow Up Recommendations: No SLP follow up Assistance recommended at discharge: Frequent or constant Supervision/Assistance SLP Visit Diagnosis: Dysphagia, pharyngoesophageal phase (R13.14) Plan: All goals met           Jahvon Gosline, Katherene Ponto  06/23/2022, 2:24 PM

## 2022-06-23 NOTE — Consult Note (Signed)
[facility]    Date of Admission:  06/20/2022     Reason for Consult:  Bacteremia and C diff     Referring Physician: Dr Sabino Gasser  Current antibiotics: Zosyn 7/8- present Dificid 7/7 - present  ASSESSMENT:    88 y.o. male admitted with:  Recent right lower lobe pneumonia and chronic loculated left pleural effusion with compressive atelectasis: Patient admitted last month and treated for pneumonia. Seen by IR during that admission for possible thoracentesis but effusion was too small and does not appear to have progressed since that time.  Also seen by pulmonary 7/8 who feel that benefit of attempting a thoracentesis at this time outweigh any benefit given his lack of respiratory symptoms, adequate oxygenation, and chronicity of effusion (appears related to rib injury after mechanical fall in May 2021). Pantoea agglomerans bacteremia: Present in 1 of 4 blood cultures.  This may be secondary to pulmonary source or GI translocation in the setting of C diff diarrhea.  Currently on Zosyn. C diff infection:  Presenting on admission with GI complaints and C diff antigen and toxin are positive.  Currently on Dificid. Dysphagia with recurrent aspiration:  Evaluated by SLP and recommendation for Dysphagia 3 diet.  Dementia  RECOMMENDATIONS:    Continue Zosyn to treat bacteremia.  This will also treat possible pneumonia however appears less likely to be contributing at this time as left pleural effusion appears chronic, lack of respiratory complaints, and maintained oxygenation on room air Continue Dificid for C diff treatment Aspiration precautions Contact precautions Lab monitoring Will follow Anticipate short course of treatment for bacteremia and continuing C diff treatment   Principal Problem:   C. difficile diarrhea Active Problems:   PAD (peripheral artery disease) (HCC)   Depressive disorder   Dementia with behavioral disturbance (HCC)   Loculated  pleural effusion   Dysphagia   Renal insufficiency   History of aspiration pneumonia   Sepsis (HCC)   Chronic diastolic CHF (congestive heart failure) (HCC)   Bacteremia due to Gram-negative bacteria   MEDICATIONS:    Scheduled Meds:  clopidogrel  75 mg Oral Daily   donepezil  5 mg Oral Daily   enoxaparin (LOVENOX) injection  40 mg Subcutaneous Daily   escitalopram  20 mg Oral Daily   ezetimibe  10 mg Oral Daily   feeding supplement  237 mL Oral BID BM   fidaxomicin  200 mg Oral BID   isosorbide mononitrate  60 mg Oral q morning   Probiotic NICU  5 drop Oral Q2000   QUEtiapine  25 mg Oral QPM   sodium chloride flush  3 mL Intravenous Q12H   Continuous Infusions:  piperacillin-tazobactam (ZOSYN)  IV 3.375 g (06/23/22 0526)   PRN Meds:.albuterol, ondansetron **OR** ondansetron (ZOFRAN) IV, QUEtiapine  HPI:    Paul [name] is a 88 y.o. male with a PMHx as below recently admitted 6/27-6/30 for pneumonia.  During that admission was noted to have a small loculated left pleural effusion.  This was felt to be too small for thoracentesis.  He returns to the hospital now 3 days ago with fever, nausea, vomiting, and diarrhea.  Repeat imaging shows persistent left pleural effusion and blood cultures are also positive for Pantoea agglomerans in 1 of 4 bottles from 06/20/22 and repeat cultures 06/21/22 are NGTD.  Patient has also tested positive for C diff while here and is currently on Dificid for this.  His daughter reports this is the first occurrence of  C diff for him.   Past Medical History:  Diagnosis Date   Back pain    Bladder tumor    BPH (benign prostatic hyperplasia)    Hyperlipidemia    Hypertension    Impaired hearing BILATERAL HEARING AIDS   Personal history of gastric ulcer     Social History   Tobacco Use   Smoking status: Former    Years: 30.00    Types: Cigarettes    Quit date: 01/20/1981    Years since quitting: 41.4   Smokeless tobacco: Never  Substance Use  Topics   Alcohol use: No   Drug use: No    Family History  Problem Relation Age of Onset   Kidney cancer Mother    Hypertension Father     Allergies  Allergen Reactions   Livalo [Pitavastatin] Other (See Comments)    Cramping and back pain   Other Diarrhea and Other (See Comments)    Unnamed patch and tablets for dementia caused diarrhea (Possibly galantamine, from outside source)     Review of Systems  Constitutional:  Positive for fever.  Respiratory:  Positive for cough.   Gastrointestinal:  Positive for abdominal pain, diarrhea and nausea.  All other systems reviewed and are negative.   OBJECTIVE:   Blood pressure (!) 144/71, pulse (!) 57, temperature 97.8 F (36.6 C), temperature source Oral, resp. rate 17, height '5\' 7"'$  (1.702 m), weight 65.4 kg, SpO2 97 %. Body mass index is 22.58 kg/m.  Physical Exam Constitutional:      Comments: Elderly man, lying in bed, on room air, NAD.  Drifting off to sleep during visit where his daughter is also present.   HENT:     Head: Normocephalic and atraumatic.  Eyes:     Extraocular Movements: Extraocular movements intact.     Conjunctiva/sclera: Conjunctivae normal.  Cardiovascular:     Rate and Rhythm: Normal rate and regular rhythm.  Pulmonary:     Effort: Pulmonary effort is normal. No respiratory distress.     Comments: Diminished at bases L > R. Abdominal:     General: There is no distension.     Palpations: Abdomen is soft.  Musculoskeletal:     Cervical back: Normal range of motion and neck supple.     Right lower leg: No edema.     Left lower leg: No edema.  Skin:    General: Skin is warm and dry.  Neurological:     General: No focal deficit present.     Mental Status: He is oriented to person, place, and time.  Psychiatric:        Mood and Affect: Mood normal.        Behavior: Behavior normal.      Lab Results: Lab Results  Component Value Date   WBC 5.0 06/23/2022   HGB 10.3 (L) 06/23/2022   HCT  32.5 (L) 06/23/2022   MCV 89.8 06/23/2022   PLT 303 06/23/2022    Lab Results  Component Value Date   NA 138 06/23/2022   K 3.8 06/23/2022   CO2 21 (L) 06/23/2022   GLUCOSE 119 (H) 06/23/2022   BUN 19 06/23/2022   CREATININE 1.01 06/23/2022   CALCIUM 8.4 (L) 06/23/2022   GFRNONAA >60 06/23/2022    Lab Results  Component Value Date   ALT 38 06/20/2022   AST 26 06/20/2022   ALKPHOS 95 06/20/2022   BILITOT 0.6 06/20/2022       Component Value Date/Time   CRP 4.6 (  H) 09/07/2021 0346    No results found for: "ESRSEDRATE"  I have reviewed the micro and lab results in Epic.  Imaging: No results found.   Imaging independently reviewed in Epic.  Raynelle Highland for Infectious Disease [facility] [phone] pager 06/23/2022, 11:40 AM

## 2022-06-23 NOTE — Progress Notes (Signed)
Progress Note    Paul [name]   EXB:[id number]  DOB: 09/02/1930  DOA: 06/20/2022     3 PCP: Crist Infante, MD  Initial CC: fever, N/V, diarrhea  Hospital Course: Paul [name] is a 88 year old male with PMH BPH, HLD, HTN, PUD, bladder tumor, BPH, dementia, CVA (February 1027), diastolic CHF who presented with diarrhea and nausea/vomiting.  Initial history was obtained from his daughter on admission due to his underlying dementia.  He had recently been hospitalized from 6/27 - 6/30 with CAP and possible aspiration.  He has a known left-sided loculated pleural effusion which was too small for thoracentesis during prior hospitalization. He was discharged home on Augmentin to complete course after being on antibiotics in the hospital as well.  He had completed Augmentin approximately 4 days prior to admission when he then began developing significant diarrhea and fevers. On work-up on admission he was found to be positive for C. difficile.  Blood cultures were also obtained on admission and 1/4 bottles were positive for Pantoea agglomerans.  He had been started on fidaxomicin for C. difficile on admission.  After blood cultures resulted, he was started on Zosyn.  Interval History:  Had some sundowning overnight and eventually the Seroquel took effect around 11 PM.  He was sleeping when seen this morning and did not awaken him due to his restless night.  Daughter present bedside.  Still having diarrhea stools but frequency seems to be improving.  Assessment and Plan:  Sepsis due to diarrhea secondary to C. difficile infection -Etiology suspected due to recent antibiotic use.  Just completed Augmentin course as well prior to admission - Denies any abdominal pain -WBC down to 8.6 today from 17.2 on admission - Continue fidaxomicin - Continue C. difficile precautions - Continue probiotic  Gram-negative bacteremia - 1/4 bottles positive for Pantoea agglomerans on admission. Source unknown but  given GNR, not likely to be a contaminate and will need to be treated - start on Zosyn for both bacteremia and pulmonary coverage; can de-escalate if susceptibilities available.  We will also ask for ID input given atypical organism   Renal insufficiency -resolved Patient baseline creatinine previously noted to be around 1, but presents with creatinine of 1.29 with BUN 18.  Urinalysis noted increased specific gravity and in the setting of reports of diarrhea dehydration.  Patient has been given 1 L normal saline IV fluids in the ED. -s/p IVF -Continue trending BMP   History of recent aspiration pneumonia Left loculated pleural effusion - s/p CAP treatment last hospitalization with possible aspiration; completed course of Augmentin at home after discharge -Procalcitonin very mildly elevated, 0.33.  - Started on Zosyn for now for further coverage to also treat bacteremia - Pulmonology consulted per family request as well, appreciate recommendations.  Effusion considered stable in size and still too small to be safe for thoracentesis   Chronic diastolic CHF Chronic.  2D echo in February 2023 showed EF 60 -65% with grade 1 diastolic dysfunction.  Patient does not appear grossly fluid overloaded at this time on physical exam. -Daily weight -Monitor intake and output   Dysphagia  - Daughter reports patient chronically gets choked up intermittently with has not changed.  - Previously recommended a dysphagia 3 diet during last hospitalization after being evaluated by speech. -Aspiration precautions with elevation  -Dysphagia 3 diet with assistance - appreciate SLP eval; PPI discontinued after discussion of cdiff risk with daughter    Dementia with behavioral disturbance During last hospitalization patient was noted  to be acute with altered.  However, Haldol made the patient significantly lethargic for which family would like [place] to avoid use of this medication. Seroquel was reported to be  helpful. -Continue Aricept -Seroquel 25 mg at night (okay if not needed/declined) and 25 mg dailyPRN   PAD -Continue Plavix   Depression -Continue Lexapro   Dyslipidemia Last lipid panel noted total cholesterol 133, HDL 39, LDL 68, and triglyceride 128. -Continue Zetia   Old records reviewed in assessment of this patient  Antimicrobials: Rocephin and azithromycin, 06/20/2022 x 1 Fidaxomicin 7/7 >> current  Zosyn 7/7 >> current   DVT prophylaxis:  enoxaparin (LOVENOX) injection 40 mg Start: 06/20/22 1245   Code Status:   Code Status: DNR  Mobility Assessment (last 72 hours)     Mobility Assessment     Row Name 06/23/22 07:50:55 06/22/22 1938 06/22/22 0747 06/21/22 1914 06/21/22 1100   Does patient have an order for bedrest or is patient medically unstable No - Continue assessment No - Continue assessment No - Continue assessment No - Continue assessment No - Continue assessment   What is the highest level of mobility based on the progressive mobility assessment? Level 4 (Walks with assist in room) - Balance while marching in place and cannot step forward and back - Complete Level 4 (Walks with assist in room) - Balance while marching in place and cannot step forward and back - Complete Level 4 (Walks with assist in room) - Balance while marching in place and cannot step forward and back - Complete Level 4 (Walks with assist in room) - Balance while marching in place and cannot step forward and back - Complete Level 4 (Walks with assist in room) - Balance while marching in place and cannot step forward and back - Complete   Is the above level different from baseline mobility prior to current illness? Yes - Recommend PT order Yes - Recommend PT order Yes - Recommend PT order -- --    [place] Name 06/20/22 2317           Does patient have an order for bedrest or is patient medically unstable No - Continue assessment       What is the highest level of mobility based on the progressive  mobility assessment? Level 4 (Walks with assist in room) - Balance while marching in place and cannot step forward and back - Complete       Is the above level different from baseline mobility prior to current illness? Yes - Recommend PT order                Disposition Plan:  Home in 2-3 days Status is: Inpt  Objective: Blood pressure 120/63, pulse (!) 59, temperature 97.7 F (36.5 C), temperature source Oral, resp. rate 17, height '5\' 7"'$  (1.702 m), weight 65.4 kg, SpO2 96 %.  Examination:  Physical Exam Constitutional:      General: He is not in acute distress.    Appearance: Normal appearance.  HENT:     Head: Normocephalic and atraumatic.     Mouth/Throat:     Mouth: Mucous membranes are moist.  Eyes:     Extraocular Movements: Extraocular movements intact.  Cardiovascular:     Rate and Rhythm: Normal rate and regular rhythm.     Heart sounds: Normal heart sounds.  Pulmonary:     Effort: Pulmonary effort is normal. No respiratory distress.     Breath sounds: Normal breath sounds. No wheezing.  Abdominal:  General: Bowel sounds are normal. There is no distension.     Palpations: Abdomen is soft.     Tenderness: There is no abdominal tenderness.  Musculoskeletal:        General: Normal range of motion.     Cervical back: Normal range of motion and neck supple.  Skin:    General: Skin is warm and dry.  Neurological:     Mental Status: He is alert. Mental status is at baseline.     Motor: No weakness.  Psychiatric:        Mood and Affect: Mood normal.        Behavior: Behavior normal.      Consultants:  ID Pulmonology   Procedures:    Data Reviewed: Results for orders placed or performed during the hospital encounter of 06/20/22 (from the past 24 hour(s))  Procalcitonin     Status: None   Collection Time: 06/23/22  1:26 AM  Result Value Ref Range   Procalcitonin <0.10 ng/mL  Basic metabolic panel     Status: Abnormal   Collection Time: 06/23/22  1:26  AM  Result Value Ref Range   Sodium 138 135 - 145 mmol/L   Potassium 3.8 3.5 - 5.1 mmol/L   Chloride 108 98 - 111 mmol/L   CO2 21 (L) 22 - 32 mmol/L   Glucose, Bld 119 (H) 70 - 99 mg/dL   BUN 19 8 - 23 mg/dL   Creatinine, Ser 1.01 0.61 - 1.24 mg/dL   Calcium 8.4 (L) 8.9 - 10.3 mg/dL   GFR, Estimated >60 >60 mL/min   Anion gap 9 5 - 15  CBC with Differential/Platelet     Status: Abnormal   Collection Time: 06/23/22  1:26 AM  Result Value Ref Range   WBC 5.0 4.0 - 10.5 K/uL   RBC 3.62 (L) 4.22 - 5.81 MIL/uL   Hemoglobin 10.3 (L) 13.0 - 17.0 g/dL   HCT 32.5 (L) 39.0 - 52.0 %   MCV 89.8 80.0 - 100.0 fL   MCH 28.5 26.0 - 34.0 pg   MCHC 31.7 30.0 - 36.0 g/dL   RDW 14.9 11.5 - 15.5 %   Platelets 303 150 - 400 K/uL   nRBC 0.0 0.0 - 0.2 %   Neutrophils Relative % 74 %   Neutro Abs 3.7 1.7 - 7.7 K/uL   Lymphocytes Relative 14 %   Lymphs Abs 0.7 0.7 - 4.0 K/uL   Monocytes Relative 9 %   Monocytes Absolute 0.5 0.1 - 1.0 K/uL   Eosinophils Relative 3 %   Eosinophils Absolute 0.2 0.0 - 0.5 K/uL   Basophils Relative 0 %   Basophils Absolute 0.0 0.0 - 0.1 K/uL   Immature Granulocytes 0 %   Abs Immature Granulocytes 0.01 0.00 - 0.07 K/uL  Magnesium     Status: None   Collection Time: 06/23/22  1:26 AM  Result Value Ref Range   Magnesium 1.9 1.7 - 2.4 mg/dL  Phosphorus     Status: None   Collection Time: 06/23/22  1:26 AM  Result Value Ref Range   Phosphorus 2.8 2.5 - 4.6 mg/dL    I have Reviewed nursing notes, Vitals, and Lab results since pt's last encounter. Pertinent lab results : see above I have ordered test including BMP, CBC, Mg I have reviewed the last note from staff over past 24 hours I have discussed pt's care plan and test results with nursing staff, case manager   LOS: 3 days   Shanon Brow  Berkleigh Beckles, MD [facility] 06/23/2022, 5:20 PM

## 2022-06-23 NOTE — Progress Notes (Signed)
   06/23/22 0940  Clinical Encounter Type  Visited With Patient not available  Visit Type Initial;Other (Comment) (Advanced Directive)  Referral From Nurse  Consult/Referral To Chaplain   Chaplain responded to a spiritual consult request for advanced directive information  Patient was asleep at the time of visit.   Danice Goltz [facility]  [phone]

## 2022-06-24 DIAGNOSIS — A0472 Enterocolitis due to Clostridium difficile, not specified as recurrent: Secondary | ICD-10-CM | POA: Diagnosis not present

## 2022-06-24 DIAGNOSIS — R1310 Dysphagia, unspecified: Secondary | ICD-10-CM | POA: Diagnosis not present

## 2022-06-24 DIAGNOSIS — R7881 Bacteremia: Secondary | ICD-10-CM | POA: Diagnosis not present

## 2022-06-24 DIAGNOSIS — F03918 Unspecified dementia, unspecified severity, with other behavioral disturbance: Secondary | ICD-10-CM | POA: Diagnosis not present

## 2022-06-24 LAB — PHOSPHORUS: Phosphorus: 2.9 mg/dL (ref 2.5–4.6)

## 2022-06-24 LAB — CBC WITH DIFFERENTIAL/PLATELET
Abs Immature Granulocytes: 0.02 10*3/uL (ref 0.00–0.07)
Basophils Absolute: 0 10*3/uL (ref 0.0–0.1)
Basophils Relative: 0 %
Eosinophils Absolute: 0.2 10*3/uL (ref 0.0–0.5)
Eosinophils Relative: 4 %
HCT: 30 % — ABNORMAL LOW (ref 39.0–52.0)
Hemoglobin: 9.9 g/dL — ABNORMAL LOW (ref 13.0–17.0)
Immature Granulocytes: 1 %
Lymphocytes Relative: 24 %
Lymphs Abs: 0.9 10*3/uL (ref 0.7–4.0)
MCH: 28.2 pg (ref 26.0–34.0)
MCHC: 33 g/dL (ref 30.0–36.0)
MCV: 85.5 fL (ref 80.0–100.0)
Monocytes Absolute: 0.4 10*3/uL (ref 0.1–1.0)
Monocytes Relative: 11 %
Neutro Abs: 2.2 10*3/uL (ref 1.7–7.7)
Neutrophils Relative %: 60 %
Platelets: 355 10*3/uL (ref 150–400)
RBC: 3.51 MIL/uL — ABNORMAL LOW (ref 4.22–5.81)
RDW: 14.8 % (ref 11.5–15.5)
WBC: 3.6 10*3/uL — ABNORMAL LOW (ref 4.0–10.5)
nRBC: 0 % (ref 0.0–0.2)

## 2022-06-24 LAB — BASIC METABOLIC PANEL
Anion gap: 8 (ref 5–15)
BUN: 14 mg/dL (ref 8–23)
CO2: 24 mmol/L (ref 22–32)
Calcium: 8.6 mg/dL — ABNORMAL LOW (ref 8.9–10.3)
Chloride: 110 mmol/L (ref 98–111)
Creatinine, Ser: 1.09 mg/dL (ref 0.61–1.24)
GFR, Estimated: >60 mL/min (ref >60)
Glucose, Bld: 101 mg/dL — ABNORMAL HIGH (ref 70–99)
Potassium: 3.9 mmol/L (ref 3.5–5.1)
Sodium: 142 mmol/L (ref 135–145)

## 2022-06-24 LAB — MAGNESIUM: Magnesium: 2 mg/dL (ref 1.7–2.4)

## 2022-06-24 MED ORDER — SULFAMETHOXAZOLE-TRIMETHOPRIM 800-160 MG PO TABS
1.0000 | ORAL_TABLET | Freq: Two times a day (BID) | ORAL | Status: DC
  Administered 2022-06-24: 1 via ORAL
  Filled 2022-06-24: qty 1

## 2022-06-24 MED ORDER — FIDAXOMICIN 200 MG PO TABS: 200.0000 mg | ORAL_TABLET | Freq: Two times a day (BID) | ORAL | Status: DC

## 2022-06-24 MED ORDER — SULFAMETHOXAZOLE-TRIMETHOPRIM 800-160 MG PO TABS: 1.0000 | ORAL_TABLET | Freq: Two times a day (BID) | ORAL | 0 refills | Status: AC

## 2022-06-24 NOTE — Progress Notes (Signed)
[name]  Date of Admission:  06/20/2022           Reason for Consult:  Bacteremia and C diff                                      Current antibiotics: Zosyn 7/8- present Dificid 7/7 - present   ASSESSMENT:    88 y.o. male admitted with:  Recent right lower lobe pneumonia and chronic loculated left pleural effusion with compressive atelectasis: Patient admitted last month and treated for pneumonia. Seen by IR during that admission for possible thoracentesis but effusion was too small and does not appear to have progressed since that time.  Also seen by pulmonary 7/8 who feel that benefit of attempting a thoracentesis at this time outweigh any benefit given his lack of respiratory symptoms, adequate oxygenation, and chronicity of effusion (appears related to rib injury after mechanical fall in May 2021). Pantoea agglomerans bacteremia: Present in 1 of 4 blood cultures.  This may be secondary to GI translocation in the setting of C diff diarrhea.  Currently on Zosyn.  Seems less likely to be pulmonary source as discussed above. C diff infection:  Presenting on admission with GI complaints and C diff antigen and toxin are positive.  Currently on Dificid and appears improved. Dysphagia with recurrent aspiration:  Evaluated by SLP and recommendation for Dysphagia 3 diet.  Dementia  RECOMMENDATIONS:    Transition to Bactrim 1 DS tablet BID to complete therapy for bacteremia.  Will do 7 days of antibiotics ending on 06/27/22. Continue Dificid '200mg'$  BID for C diff.  Will plan for continuation of therapy upon discharge while on Bactrim and for approximately 1 week after completion of Bactrim. He should have received a 10 day supply of Dificid shipped to his house last weekend so he can start that when he goes home.  This will provide the week of tail coverage needed after finishing Bactrim to adequately treat his C diff. Will sign off, please call back as  needed.   Principal Problem:   C. difficile diarrhea Active Problems:   PAD (peripheral artery disease) (HCC)   Depressive disorder   Dementia with behavioral disturbance (HCC)   Loculated pleural effusion   Dysphagia   Renal insufficiency   History of aspiration pneumonia   Sepsis (HCC)   Chronic diastolic CHF (congestive heart failure) (HCC)   Bacteremia due to Gram-negative bacteria    MEDICATIONS:    Scheduled Meds:  clopidogrel  75 mg Oral Daily   donepezil  5 mg Oral Daily   enoxaparin (LOVENOX) injection  40 mg Subcutaneous Daily   escitalopram  20 mg Oral Daily   ezetimibe  10 mg Oral Daily   feeding supplement  237 mL Oral BID BM   fidaxomicin  200 mg Oral BID   isosorbide mononitrate  60 mg Oral q morning   Probiotic NICU  5 drop Oral Q2000   QUEtiapine  25 mg Oral QPM   sodium chloride flush  3 mL Intravenous Q12H   Continuous Infusions:  piperacillin-tazobactam (ZOSYN)  IV 3.375 g (06/24/22 0346)   PRN Meds:.albuterol, ondansetron **OR** ondansetron (ZOFRAN) IV, QUEtiapine  SUBJECTIVE:   24 hour events:  Tmax 98.3 Stable labs No new imaging No new cultures Approximately 2 stool occurrences recorded yesterday  Patient seen this AM.  Appearing well, reports no issues with  diarrhea today.  He is breathing nicely on room air.   Review of Systems  All other systems reviewed and are negative.     OBJECTIVE:   Blood pressure (!) 154/59, pulse 60, temperature 98.1 F (36.7 C), resp. rate 17, height '5\' 7"'$  (1.702 m), weight 65.4 kg, SpO2 98 %. Body mass index is 22.58 kg/m.  Physical Exam Constitutional:      General: He is not in acute distress.    Appearance: Normal appearance.  HENT:     Head: Normocephalic and atraumatic.  Eyes:     Extraocular Movements: Extraocular movements intact.     Conjunctiva/sclera: Conjunctivae normal.  Pulmonary:     Effort: Pulmonary effort is normal. No respiratory distress.  Abdominal:     General: There is  no distension.     Palpations: Abdomen is soft.     Tenderness: There is no abdominal tenderness.  Musculoskeletal:        General: Normal range of motion.     Cervical back: Normal range of motion and neck supple.  Skin:    General: Skin is warm and dry.  Neurological:     General: No focal deficit present.     Mental Status: He is alert. Mental status is at baseline.  Psychiatric:        Mood and Affect: Mood normal.        Behavior: Behavior normal.      Lab Results: Lab Results  Component Value Date   WBC 3.6 (L) 06/24/2022   HGB 9.9 (L) 06/24/2022   HCT 30.0 (L) 06/24/2022   MCV 85.5 06/24/2022   PLT 355 06/24/2022    Lab Results  Component Value Date   NA 142 06/24/2022   K 3.9 06/24/2022   CO2 24 06/24/2022   GLUCOSE 101 (H) 06/24/2022   BUN 14 06/24/2022   CREATININE 1.09 06/24/2022   CALCIUM 8.6 (L) 06/24/2022   GFRNONAA >60 06/24/2022    Lab Results  Component Value Date   ALT 38 06/20/2022   AST 26 06/20/2022   ALKPHOS 95 06/20/2022   BILITOT 0.6 06/20/2022       Component Value Date/Time   CRP 4.6 (H) 09/07/2021 0346    No results found for: "ESRSEDRATE"   I have reviewed the micro and lab results in Epic.  Imaging: No results found.   Imaging independently reviewed in Epic.    Raynelle Highland for Infectious Disease [facility] [phone] pager 06/24/2022, 6:47 AM

## 2022-06-24 NOTE — Progress Notes (Signed)
Discharge instructions given to the patient and patient's daughter.  Both verbalized understanding.  Encouraged to call the doctor for concerns.  Discharged home.

## 2022-06-24 NOTE — Evaluation (Signed)
Occupational Therapy Evaluation Patient Details Name: Paul [name] MRN: [id number] DOB: 07/12/1930 Today's Date: 06/24/2022   History of Present Illness 88 yo male admitted with Cdiff weaknes. Pt with recent admission 6/27-6/30 [facility] with hypoxic respiratory failure PMH Dementia HTN Cva of L medulla 01/2022 bladder CA in remission and gastric ulcer   Clinical Impression   PT admitted with Cdiff. Pt currently with functional limitiations due to the deficits listed below (see OT problem list). Pt with prolonged bed level care since Thursday per daughter. Pt eager to get OOB this session and engaged. Pt demonstrates decreased balance and overall generalized weakness.  Pt will benefit from skilled OT to increase their independence and safety with adls and balance to allow discharge hhot.       Recommendations for follow up therapy are one component of a multi-disciplinary discharge planning process, led by the attending physician.  Recommendations may be updated based on patient status, additional functional criteria and insurance authorization.   Follow Up Recommendations  Home health OT    Assistance Recommended at Discharge Intermittent Supervision/Assistance  Patient can return home with the following A little help with walking and/or transfers;A little help with bathing/dressing/bathroom;Assistance with cooking/housework;Assistance with feeding;Assist for transportation    Functional Status Assessment  Patient has had a recent decline in their functional status and demonstrates the ability to make significant improvements in function in a reasonable and predictable amount of time.  Equipment Recommendations  None recommended by OT    Recommendations for Other Services       Precautions / Restrictions Precautions Precautions: Fall Precaution Comments: primafit , sundowning, dementia with behavioral disturbances Restrictions Weight Bearing Restrictions: No      Mobility  Bed Mobility Overal bed mobility: Needs Assistance Bed Mobility: Supine to Sit     Supine to sit: Min assist     General bed mobility comments: pt with mod cues for hand placement and able to bring BIL LE to eob and needs (A) To elevate trunk off surface. Pt requires MOD (A) of the pad to scoot to eob. pt able to power up from slight elevated surface    Transfers Overall transfer level: Needs assistance Equipment used: Rolling walker (2 wheels) Transfers: Sit to/from Stand Sit to Stand: Min guard           General transfer comment: pt able to power off surface with bil UE use      Balance Overall balance assessment: Needs assistance   Sitting balance-Leahy Scale: Fair     Standing balance support: Bilateral upper extremity supported, During functional activity, Reliant on assistive device for balance Standing balance-Leahy Scale: Poor                             ADL either performed or assessed with clinical judgement   ADL Overall ADL's : Needs assistance/impaired Eating/Feeding: Set up   Grooming: Applying deodorant   Upper Body Bathing: Minimal assistance   Lower Body Bathing: Moderate assistance   Upper Body Dressing : Minimal assistance   Lower Body Dressing: Moderate assistance   Toilet Transfer: Minimal assistance;Ambulation;BSC/3in1   Toileting- Clothing Manipulation and Hygiene: Maximal assistance;Sit to/from stand Toileting - Clothing Manipulation Details (indicate cue type and reason): diarrhea noted       General ADL Comments: daughter present for session. daughter expressed concerns over loose stool     Vision Baseline Vision/History: 1 Wears glasses Ability to See in Adequate Light:  0 Adequate Patient Visual Report: No change from baseline       Perception     Praxis      Pertinent Vitals/Pain Pain Assessment Pain Assessment: No/denies pain     Hand Dominance Right   Extremity/Trunk Assessment Upper Extremity  Assessment Upper Extremity Assessment: Generalized weakness   Lower Extremity Assessment Lower Extremity Assessment: Generalized weakness   Cervical / Trunk Assessment Cervical / Trunk Assessment: Kyphotic   Communication Communication Communication: HOH   Cognition Arousal/Alertness: Awake/alert Behavior During Therapy: WFL for tasks assessed/performed Overall Cognitive Status: History of cognitive impairments - at baseline                                 General Comments: able to verbalize name DOB and daughter in room name     General Comments  daughter expressed hygiene at home with brief very difficult prior to this admission due to diarrhea    Exercises     Shoulder Instructions      Home Living Family/patient expects to be discharged to:: Private residence Living Arrangements: Spouse/significant other Available Help at Discharge: Family;Available 24 hours/day Type of Home: House Home Access: Stairs to enter [employer] of Steps: 3 Entrance Stairs-Rails: Right;Left Home Layout: One level     Bathroom Shower/Tub: Occupational psychologist: Standard Bathroom Accessibility: Yes   Home Equipment: Conservation officer, nature (2 wheels);Shower seat;Grab bars - tub/shower;BSC/3in1;Wheelchair - manual          Prior Functioning/Environment Prior Level of Function : Needs assist             Mobility Comments: has assistance with walking with a walker ADLs Comments: Family assists with all ADLs, as needed. Pt frequently incontinent of bowel.        OT Problem List: Impaired balance (sitting and/or standing);Decreased activity tolerance;Decreased cognition;Decreased safety awareness;Decreased knowledge of use of DME or AE;Decreased knowledge of precautions      OT Treatment/Interventions: Self-care/ADL training;Therapeutic exercise;Energy conservation;DME and/or AE instruction;Manual therapy;Therapeutic activities;Patient/family  education;Balance training    OT Goals(Current goals can be found in the care plan section) Acute Rehab OT Goals Patient Stated Goal: to go home today per patient OT Goal Formulation: With patient/family Time For Goal Achievement: 07/08/22 Potential to Achieve Goals: Good  OT Frequency: Min 2X/week    Co-evaluation              AM-PAC OT "6 Clicks" Daily Activity     Outcome Measure Help from another person eating meals?: A Little Help from another person taking care of personal grooming?: A Little Help from another person toileting, which includes using toliet, bedpan, or urinal?: A Lot Help from another person bathing (including washing, rinsing, drying)?: A Lot Help from another person to put on and taking off regular upper body clothing?: A Little Help from another person to put on and taking off regular lower body clothing?: A Lot 6 Click Score: 15   End of Session Equipment Utilized During Treatment: Rolling walker (2 wheels) Nurse Communication: Mobility status;Precautions  Activity Tolerance: Patient tolerated treatment well Patient left: in chair;with call bell/phone within reach  OT Visit Diagnosis: Unsteadiness on feet (R26.81);Muscle weakness (generalized) (M62.81)                Time: 9166-0600 OT Time Calculation (min): 38 min Charges:  OT General Charges $OT Visit: 1 Visit OT Evaluation $OT Eval Moderate Complexity: 1 Mod OT Treatments $  Self Care/Home Management : 8-22 mins   Brynn, OTR/L  Acute Rehabilitation Services Office: [phone] .   Jeri Modena 06/24/2022, 12:01 PM

## 2022-06-24 NOTE — Progress Notes (Signed)
Progress Note    Paul [name]   KKX:[id number]  DOB: February 18, 1930  DOA: 06/20/2022     4 PCP: Crist Infante, MD  Initial CC: fever, N/V, diarrhea  Hospital Course: Paul [name] is a 88 year old male with PMH BPH, HLD, HTN, PUD, bladder tumor, BPH, dementia, CVA (February 1696), diastolic CHF who presented with diarrhea and nausea/vomiting.  Initial history was obtained from his daughter on admission due to his underlying dementia.  He had recently been hospitalized from 6/27 - 6/30 with CAP and possible aspiration.  He has a known left-sided loculated pleural effusion which was too small for thoracentesis during prior hospitalization. He was discharged home on Augmentin to complete course after being on antibiotics in the hospital as well.  He had completed Augmentin approximately 4 days prior to admission when he then began developing significant diarrhea and fevers. On work-up on admission he was found to be positive for C. difficile.  Blood cultures were also obtained on admission and 1/4 bottles were positive for Pantoea agglomerans.  He had been started on fidaxomicin for C. difficile on admission.  After blood cultures resulted, he was started on Zosyn.  Interval History:  Did better overnight with no sundowning.  He is awake and alert this morning.  He feels well with no concerns. He did work with OT and had a very loose bowel movement along with decreased balance and generalized weakness.  Daughter was nervous for taking him home today and due to his ongoing loose stools, we will plan on at least 1 more night in the hospital.  Assessment and Plan:  Sepsis due to diarrhea secondary to C. difficile infection -Etiology suspected due to recent antibiotic use.  Just completed Augmentin course as well prior to admission - Denies any abdominal pain -WBC normalized from 17.2 on admission - Continue fidaxomicin - Continue C. difficile precautions - Continue probiotic  Gram-negative  bacteremia - 1/4 bottles positive for Pantoea agglomerans on admission. Source unknown but given GNR, not likely to be a contaminate and will need to be treated - start on Zosyn for both bacteremia and pulmonary coverage; can de-escalate if susceptibilities available.  We will also ask for ID input given atypical organism - appreciate ID recommendations - transition to bactrim to complete treatment until 06/27/22 then continue with dificid for 1 week after bactrim completion (family has dificid already delivered at home for use at discharge)   Renal insufficiency -resolved Patient baseline creatinine previously noted to be around 1, but presents with creatinine of 1.29 with BUN 18.  Urinalysis noted increased specific gravity and in the setting of reports of diarrhea dehydration.  Patient has been given 1 L normal saline IV fluids in the ED. -s/p IVF -Continue trending BMP   History of recent aspiration pneumonia Left loculated pleural effusion - s/p CAP treatment last hospitalization with possible aspiration; completed course of Augmentin at home after discharge -Procalcitonin very mildly elevated, 0.33.  - s/p treatment with zosyn while inpatient; effusion considered to be chronic at this time and too small for thora per pulm  - Pulmonology consulted per family request as well, appreciate recommendations.  Effusion considered stable in size and still too small to be safe for thoracentesis   Chronic diastolic CHF Chronic.  2D echo in February 2023 showed EF 60 -65% with grade 1 diastolic dysfunction.  Patient does not appear grossly fluid overloaded at this time on physical exam. -Daily weight -Monitor intake and output   Dysphagia  - Daughter  reports patient chronically gets choked up intermittently with has not changed.  - Previously recommended a dysphagia 3 diet during last hospitalization after being evaluated by speech. -Aspiration precautions with elevation  -Dysphagia 3 diet with  assistance - appreciate SLP eval; PPI discontinued after discussion of cdiff risk with daughter    Dementia with behavioral disturbance During last hospitalization patient was noted to be acute with altered.  However, Haldol made the patient significantly lethargic for which family would like [place] to avoid use of this medication. Seroquel was reported to be helpful. -Continue Aricept -Seroquel 25 mg at night (okay if not needed/declined) and 25 mg dailyPRN   PAD -Continue Plavix   Depression -Continue Lexapro   Dyslipidemia Last lipid panel noted total cholesterol 133, HDL 39, LDL 68, and triglyceride 128. -Continue Zetia   Old records reviewed in assessment of this patient  Antimicrobials: Rocephin and azithromycin, 06/20/2022 x 1 Fidaxomicin 7/7 >> current  Zosyn 7/7 >> 06/24/22 Bactrim 06/24/22 >> current  DVT prophylaxis:  enoxaparin (LOVENOX) injection 40 mg Start: 06/20/22 1245   Code Status:   Code Status: DNR  Mobility Assessment (last 72 hours)     Mobility Assessment     Row Name 06/24/22 1100 06/24/22 1008 06/23/22 07:50:55 06/22/22 1938 06/22/22 0747   Does patient have an order for bedrest or is patient medically unstable -- No - Continue assessment No - Continue assessment No - Continue assessment No - Continue assessment   What is the highest level of mobility based on the progressive mobility assessment? Level 4 (Walks with assist in room) - Balance while marching in place and cannot step forward and back - Complete Level 5 (Walks with assist in room/hall) - Balance while stepping forward/back and can walk in room with assist - Complete Level 4 (Walks with assist in room) - Balance while marching in place and cannot step forward and back - Complete Level 4 (Walks with assist in room) - Balance while marching in place and cannot step forward and back - Complete Level 4 (Walks with assist in room) - Balance while marching in place and cannot step forward and back -  Complete   Is the above level different from baseline mobility prior to current illness? -- Yes - Recommend PT order Yes - Recommend PT order Yes - Recommend PT order Yes - Recommend PT order    Row Name 06/21/22 1914           Does patient have an order for bedrest or is patient medically unstable No - Continue assessment       What is the highest level of mobility based on the progressive mobility assessment? Level 4 (Walks with assist in room) - Balance while marching in place and cannot step forward and back - Complete                Disposition Plan:  Home in 2-3 days Status is: Inpt  Objective: Blood pressure (!) 150/65, pulse 61, temperature 97.8 F (36.6 C), temperature source Oral, resp. rate 18, height '5\' 7"'$  (1.702 m), weight 65.4 kg, SpO2 97 %.  Examination:  Physical Exam Constitutional:      General: He is not in acute distress.    Appearance: Normal appearance.  HENT:     Head: Normocephalic and atraumatic.     Mouth/Throat:     Mouth: Mucous membranes are moist.  Eyes:     Extraocular Movements: Extraocular movements intact.  Cardiovascular:     Rate  and Rhythm: Normal rate and regular rhythm.     Heart sounds: Normal heart sounds.  Pulmonary:     Effort: Pulmonary effort is normal. No respiratory distress.     Breath sounds: Normal breath sounds. No wheezing.  Abdominal:     General: Bowel sounds are normal. There is no distension.     Palpations: Abdomen is soft.     Tenderness: There is no abdominal tenderness.  Musculoskeletal:        General: Normal range of motion.     Cervical back: Normal range of motion and neck supple.  Skin:    General: Skin is warm and dry.  Neurological:     Mental Status: He is alert. Mental status is at baseline.     Motor: No weakness.  Psychiatric:        Mood and Affect: Mood normal.        Behavior: Behavior normal.      Consultants:  ID Pulmonology   Procedures:    Data Reviewed: Results for orders  placed or performed during the hospital encounter of 06/20/22 (from the past 24 hour(s))  Basic metabolic panel     Status: Abnormal   Collection Time: 06/24/22  1:20 AM  Result Value Ref Range   Sodium 142 135 - 145 mmol/L   Potassium 3.9 3.5 - 5.1 mmol/L   Chloride 110 98 - 111 mmol/L   CO2 24 22 - 32 mmol/L   Glucose, Bld 101 (H) 70 - 99 mg/dL   BUN 14 8 - 23 mg/dL   Creatinine, Ser 1.09 0.61 - 1.24 mg/dL   Calcium 8.6 (L) 8.9 - 10.3 mg/dL   GFR, Estimated >60 >60 mL/min   Anion gap 8 5 - 15  CBC with Differential/Platelet     Status: Abnormal   Collection Time: 06/24/22  1:20 AM  Result Value Ref Range   WBC 3.6 (L) 4.0 - 10.5 K/uL   RBC 3.51 (L) 4.22 - 5.81 MIL/uL   Hemoglobin 9.9 (L) 13.0 - 17.0 g/dL   HCT 30.0 (L) 39.0 - 52.0 %   MCV 85.5 80.0 - 100.0 fL   MCH 28.2 26.0 - 34.0 pg   MCHC 33.0 30.0 - 36.0 g/dL   RDW 14.8 11.5 - 15.5 %   Platelets 355 150 - 400 K/uL   nRBC 0.0 0.0 - 0.2 %   Neutrophils Relative % 60 %   Neutro Abs 2.2 1.7 - 7.7 K/uL   Lymphocytes Relative 24 %   Lymphs Abs 0.9 0.7 - 4.0 K/uL   Monocytes Relative 11 %   Monocytes Absolute 0.4 0.1 - 1.0 K/uL   Eosinophils Relative 4 %   Eosinophils Absolute 0.2 0.0 - 0.5 K/uL   Basophils Relative 0 %   Basophils Absolute 0.0 0.0 - 0.1 K/uL   Immature Granulocytes 1 %   Abs Immature Granulocytes 0.02 0.00 - 0.07 K/uL  Magnesium     Status: None   Collection Time: 06/24/22  1:20 AM  Result Value Ref Range   Magnesium 2.0 1.7 - 2.4 mg/dL  Phosphorus     Status: None   Collection Time: 06/24/22  1:20 AM  Result Value Ref Range   Phosphorus 2.9 2.5 - 4.6 mg/dL    I have Reviewed nursing notes, Vitals, and Lab results since pt's last encounter. Pertinent lab results : see above I have ordered test including BMP, CBC, Mg I have reviewed the last note from staff over past 24 hours I  have discussed pt's care plan and test results with nursing staff, case manager   LOS: 4 days   Dwyane Dee, MD [facility] 06/24/2022, 1:43 PM

## 2022-06-24 NOTE — Plan of Care (Signed)
  Problem: Health Behavior/Discharge Planning: Goal: Ability to manage health-related needs will improve Outcome: Not Progressing   Problem: Clinical Measurements: Goal: Ability to maintain clinical measurements within normal limits will improve Outcome: Progressing Goal: Will remain free from infection Outcome: Progressing Goal: Respiratory complications will improve Outcome: Progressing

## 2022-06-24 NOTE — Evaluation (Signed)
Physical Therapy Evaluation Patient Details Name: Paul [name] MRN: [id number] DOB: 1930/03/09 Today's Date: 06/24/2022  History of Present Illness  Pt is a 88 y.o. male with medical history significant of dementia, hypertension, CVA of left medulla in 05/108, diastolic CHF, bladder cancer in remission, and gastric ulcer who presented with complaints of diarrhea with nausea and vomiting (cdiff).  Patient has dementia and therefore history is obtained from his daughter over the phone. Patient had just recently been hospitalized 6/27-6/30 with acute respiratory failure with hypoxia secondary to community-acquired pneumonia possibly secondary to aspiration with findings of a loculated pleural effusion.  Clinical Impression  Pt agreeable to physical therapy evaluation/treatment session. Pt requiring CGA to minimal assistance at this time for functional mobility. Pt with decreased safety awareness with RW despite cues. Pt currently presents with functional limitations secondary to impairments listed in PT problem list. Pt to benefit from skilled, acute care physical therapy interventions to maximize his strength, independence level and quality of life.       Recommendations for follow up therapy are one component of a multi-disciplinary discharge planning process, led by the attending physician.  Recommendations may be updated based on patient status, additional functional criteria and insurance authorization.  Follow Up Recommendations Home health PT      Assistance Recommended at Discharge Frequent or constant Supervision/Assistance  Patient can return home with the following  A little help with walking and/or transfers;A little help with bathing/dressing/bathroom;Assist for transportation;Help with stairs or ramp for entrance;Assistance with cooking/housework;Direct supervision/assist for medications management;Direct supervision/assist for financial management    Equipment Recommendations None  recommended by PT  Recommendations for Other Services       Functional Status Assessment Patient has had a recent decline in their functional status and demonstrates the ability to make significant improvements in function in a reasonable and predictable amount of time.     Precautions / Restrictions Precautions Precautions: Fall Precaution Comments: primafit , sundowning, dementia with behavioral disturbances Restrictions Weight Bearing Restrictions: No      Mobility  Bed Mobility               General bed mobility comments: Pt up in chair but required min assist for supine to sit during OT eval.    Transfers Overall transfer level: Needs assistance Equipment used: Rolling walker (2 wheels) Transfers: Sit to/from Stand Sit to Stand: Min guard           General transfer comment: Requires B UE use; cues for hand placements.    Ambulation/Gait Ambulation/Gait assistance: Min guard, Min assist Gait Distance (Feet): 40 Feet Assistive device: Rolling walker (2 wheels) Gait Pattern/deviations: Decreased step length - right, Decreased step length - left, Narrow base of support, Trunk flexed   Gait velocity interpretation: <1.31 ft/sec, indicative of household ambulator   General Gait Details: Pt required cues for maintaining his COG inside RW but poor carryover. Pt also required cues to increase step width- pt crossing R LE over across midline causing contact with L LE.  Stairs            Wheelchair Mobility    Modified Rankin (Stroke Patients Only)       Balance Overall balance assessment: Needs assistance   Sitting balance-Leahy Scale: Fair     Standing balance support: Reliant on assistive device for balance, Bilateral upper extremity supported Standing balance-Leahy Scale: Poor  Pertinent Vitals/Pain Pain Assessment Pain Assessment: No/denies pain    Home Living Family/patient expects to be  discharged to:: Private residence Living Arrangements: Spouse/significant other Available Help at Discharge: Family;Available 24 hours/day (spouse, son, and DTR (none of 3 work)) Type of Home: House Home Access: Stairs to enter Entrance Stairs-Rails: Left Entrance Stairs-Number of Steps: 3   Home Layout: One level Home Equipment: Conservation officer, nature (2 wheels);Shower seat;Grab bars - tub/shower;BSC/3in1;Wheelchair - manual      Prior Function Prior Level of Function : Needs assist;History of Falls (last six months) (Pt does not drive. Pt retired.)  Cognitive Assist : Mobility (cognitive);ADLs (cognitive) Mobility (Cognitive): Intermittent cues ADLs (Cognitive): Intermittent cues Physical Assist : Mobility (physical);ADLs (physical) Mobility (physical): Bed mobility;Transfers;Gait;Stairs ADLs (physical): Grooming;Bathing;Dressing;Toileting Mobility Comments: Mostly uses RW with standby A. Uses WC for longer distances (has started to wean off it since stroke in February). Pt requires total assistance via WC to get in/out of house (up/down steps). ADLs Comments: Pt's DTR and son take care of his errands. Pt's spouse takes care of his medications and meals.     Hand Dominance   Dominant Hand: Right    Extremity/Trunk Assessment   Upper Extremity Assessment Upper Extremity Assessment: Defer to OT evaluation    Lower Extremity Assessment Lower Extremity Assessment:  (Grossly 4- to 4/5 B LE's)    Cervical / Trunk Assessment Cervical / Trunk Assessment: Kyphotic  Communication   Communication: HOH  Cognition Arousal/Alertness: Awake/alert Behavior During Therapy: WFL for tasks assessed/performed Overall Cognitive Status: History of cognitive impairments - at baseline                                 General Comments: Pt oriented to self. Cues required to orient to location. Not oriented to time or situation. Pt has dementia.        General Comments      Exercises      Assessment/Plan    PT Assessment Patient needs continued PT services  PT Problem List Decreased strength;Decreased activity tolerance;Decreased balance;Decreased mobility;Decreased cognition;Decreased knowledge of use of DME;Decreased safety awareness       PT Treatment Interventions DME instruction;Gait training;Functional mobility training;Therapeutic activities;Therapeutic exercise;Balance training;Cognitive remediation;Neuromuscular re-education;Patient/family education;Wheelchair mobility training    PT Goals (Current goals can be found in the Care Plan section)  Acute Rehab PT Goals Patient Stated Goal: none PT Goal Formulation: With patient Time For Goal Achievement: 07/01/22 Potential to Achieve Goals: Good    Frequency Min 3X/week     Co-evaluation               AM-PAC PT "6 Clicks" Mobility  Outcome Measure Help needed turning from your back to your side while in a flat bed without using bedrails?: A Little Help needed moving from lying on your back to sitting on the side of a flat bed without using bedrails?: A Little Help needed moving to and from a bed to a chair (including a wheelchair)?: A Little Help needed standing up from a chair using your arms (e.g., wheelchair or bedside chair)?: A Little Help needed to walk in hospital room?: A Little Help needed climbing 3-5 steps with a railing? : A Lot 6 Click Score: 17    End of Session Equipment Utilized During Treatment: Gait belt Activity Tolerance: Patient tolerated treatment well;Patient limited by fatigue Patient left: in chair;with call bell/phone within reach;with chair alarm set;with family/visitor present Nurse Communication: Mobility status  PT Visit Diagnosis: Other abnormalities of gait and mobility (R26.89);History of falling (Z91.81);Muscle weakness (generalized) (M62.81)    Time: 8416-6063 PT Time Calculation (min) (ACUTE ONLY): 33 min   Charges:   PT Evaluation $PT Eval Low Complexity:  1 Low PT Treatments $Gait Training: 8-22 mins        Donna Bernard, PT   [facility] 06/24/2022, 3:27 PM

## 2022-06-24 NOTE — Discharge Summary (Signed)
Physician Discharge Summary   Paul [name]:[id number] DOB: [birth year] DOA: 06/20/2022  PCP: Crist Infante, MD  Admit date: 06/20/2022 Discharge date: 06/24/2022  Admitted From: Home Disposition:  Home Discharging physician: Dwyane Dee, MD  Recommendations for Outpatient Follow-up:  Continue routine long term care   Home Health: PT Equipment/Devices:   Discharge Condition: stable CODE STATUS: DNR Diet recommendation:  Diet Orders (From admission, onward)     Start     Ordered   06/20/22 1406  DIET DYS 3 Room service appropriate? Yes with Assist; Fluid consistency: Thin  Diet effective now       Question Answer Comment  Room service appropriate? Yes with Assist   Fluid consistency: Thin      06/20/22 1405            Hospital Course: Mr. Roy is a 88 year old male with PMH BPH, HLD, HTN, PUD, bladder tumor, BPH, dementia, CVA (February 9937), diastolic CHF who presented with diarrhea and nausea/vomiting.  Initial history was obtained from his daughter on admission due to his underlying dementia.  He had recently been hospitalized from 6/27 - 6/30 with CAP and possible aspiration.  He has a known left-sided loculated pleural effusion which was too small for thoracentesis during prior hospitalization. He was discharged home on Augmentin to complete course after being on antibiotics in the hospital as well.  He had completed Augmentin approximately 4 days prior to admission when he then began developing significant diarrhea and fevers. On work-up on admission he was found to be positive for C. difficile.  Blood cultures were also obtained on admission and 1/4 bottles were positive for Pantoea agglomerans.  He had been started on fidaxomicin for C. difficile on admission.  After blood cultures resulted, he was started on Zosyn. He underwent evaluation by ID during hospitalization. He was recommended to de-escalate to Bactrim to complete course with ongoing 1 week fidaxomicin  after Bactrim completion.  Assessment and Plan:  Sepsis due to diarrhea secondary to C. difficile infection -Etiology suspected due to recent antibiotic use.  Just completed Augmentin course as well prior to admission - Denies any abdominal pain - WBC normalized from 17.2 on admission - Continue fidaxomicin after completion of Bactrim course   Gram-negative bacteremia - 1/4 bottles positive for Pantoea agglomerans on admission. Source unknown but given GNR, not likely to be a contaminate and will need to be treated - start on Zosyn for both bacteremia and pulmonary coverage; can de-escalate if susceptibilities available.   - appreciate ID recommendations - transition to bactrim to complete treatment until 06/27/22 then continue with dificid for 1 week after bactrim completion (family has dificid already delivered at home for use at discharge)   Renal insufficiency -resolved Patient baseline creatinine previously noted to be around 1, but presents with creatinine of 1.29 with BUN 18.  Urinalysis noted increased specific gravity and in the setting of reports of diarrhea dehydration.  Patient has been given 1 L normal saline IV fluids in the ED. -s/p IVF   History of recent aspiration pneumonia Left loculated pleural effusion - s/p CAP treatment last hospitalization with possible aspiration; completed course of Augmentin at home after discharge -Procalcitonin very mildly elevated, 0.33.  - s/p treatment with zosyn while inpatient; effusion considered to be chronic at this time and too small for thora per pulm  - Pulmonology consulted per family request as well, appreciate recommendations.  Effusion considered stable in size and still too small to be safe for thoracentesis  Chronic diastolic CHF Chronic.  2D echo in February 2023 showed EF 60 -65% with grade 1 diastolic dysfunction.  Patient does not appear grossly fluid overloaded at this time on physical exam.   Dysphagia  - Daughter  reports patient chronically gets choked up intermittently with has not changed.  - Previously recommended a dysphagia 3 diet during last hospitalization after being evaluated by speech. -Dysphagia 3 diet with assistance - appreciate SLP eval; PPI discontinued after discussion of cdiff risk with daughter    Dementia with behavioral disturbance During last hospitalization patient was noted to be acute with altered.  However, Haldol made the patient significantly lethargic for which family would like [place] to avoid use of this medication. Seroquel was reported to be helpful. -Continue Aricept -Seroquel 25 mg at night (okay if not needed/declined) and 25 mg dailyPRN   PAD -Continue Plavix   Depression -Continue Lexapro   Dyslipidemia Last lipid panel noted total cholesterol 133, HDL 39, LDL 68, and triglyceride 128. -Continue Zetia   The patient's chronic medical conditions were treated accordingly per the patient's home medication regimen except as noted.  On day of discharge, patient was felt deemed stable for discharge. Patient/family member advised to call PCP or come back to ER if needed.   Principal Diagnosis: C. difficile diarrhea  Discharge Diagnoses: Active Hospital Problems   Diagnosis Date Noted   C. difficile diarrhea 06/20/2022    Priority: 1.   Bacteremia due to Gram-negative bacteria 06/21/2022    Priority: 1.   Sepsis ([place]) 06/20/2022    Priority: 1.   Renal insufficiency 06/20/2022    Priority: 2.   Loculated pleural effusion 06/10/2022    Priority: 2.   History of aspiration pneumonia 06/20/2022    Priority: 3.   Chronic diastolic CHF (congestive heart failure) ([place]) 06/20/2022    Priority: 4.   Dysphagia 06/10/2022    Priority: 6.   Dementia with behavioral disturbance ([place]) 09/05/2021    Priority: 7.   PAD (peripheral artery disease) ([place]) 05/26/2012    Priority: 8.   Depressive disorder 08/28/2015    Priority: 9.    Resolved Hospital Problems  No  resolved problems to display.     Discharge Instructions     Increase activity slowly   Complete by: As directed       Allergies as of 06/24/2022       Reactions   Livalo [pitavastatin] Other (See Comments)   Cramping and back pain   Other Diarrhea, Other (See Comments)   Unnamed patch and tablets for dementia caused diarrhea (Possibly galantamine, from outside source)         Medication List     TAKE these medications    acetaminophen 500 MG tablet Commonly known as: TYLENOL Take 1,000 mg by mouth daily as needed for mild pain or headache.   clopidogrel 75 MG tablet Commonly known as: PLAVIX Take 75 mg by mouth daily.   donepezil 5 MG tablet Commonly known as: ARICEPT Take 5 mg by mouth daily.   escitalopram 20 MG tablet Commonly known as: LEXAPRO Take 20 mg by mouth daily.   ezetimibe 10 MG tablet Commonly known as: ZETIA Take 10 mg by mouth daily.   fidaxomicin 200 MG Tabs tablet Commonly known as: DIFICID Take 1 tablet (200 mg total) by mouth 2 (two) times daily.   isosorbide mononitrate 60 MG 24 hr tablet Commonly known as: IMDUR Take 1 tablet (60 mg total) by mouth every morning. do not give  if systolic blood pressure < 130 What changed: additional instructions   sulfamethoxazole-trimethoprim 800-160 MG tablet Commonly known as: BACTRIM DS Take 1 tablet by mouth 2 (two) times daily for 3 days. Stop date 06/27/22        Follow-up Information     [facility] Follow up.   Specialty: Home Health Services Contact information: [place] [address] [phone]                Allergies  Allergen Reactions   Livalo [Pitavastatin] Other (See Comments)    Cramping and back pain   Other Diarrhea and Other (See Comments)    Unnamed patch and tablets for dementia caused diarrhea (Possibly galantamine, from outside source)     Consultations: ID  Procedures:   Discharge Exam: BP (!) 147/65 (BP  Location: Left Arm)   Pulse 68   Temp 98.4 F (36.9 C)   Resp 18   Ht '5\' 7"'$  (1.702 m)   Wt 65.4 kg   SpO2 95%   BMI 22.58 kg/m  Physical Exam Constitutional:      General: He is not in acute distress.    Appearance: Normal appearance.  HENT:     Head: Normocephalic and atraumatic.     Mouth/Throat:     Mouth: Mucous membranes are moist.  Eyes:     Extraocular Movements: Extraocular movements intact.  Cardiovascular:     Rate and Rhythm: Normal rate and regular rhythm.     Heart sounds: Normal heart sounds.  Pulmonary:     Effort: Pulmonary effort is normal. No respiratory distress.     Breath sounds: Normal breath sounds. No wheezing.  Abdominal:     General: Bowel sounds are normal. There is no distension.     Palpations: Abdomen is soft.     Tenderness: There is no abdominal tenderness.  Musculoskeletal:        General: Normal range of motion.     Cervical back: Normal range of motion and neck supple.  Skin:    General: Skin is warm and dry.  Neurological:     Mental Status: He is alert. Mental status is at baseline.     Motor: No weakness.  Psychiatric:        Mood and Affect: Mood normal.        Behavior: Behavior normal.      The results of significant diagnostics from this hospitalization (including imaging, microbiology, ancillary and laboratory) are listed below for reference.   Microbiology: Recent Results (from the past 240 hour(s))  Blood Culture (routine x 2)     Status: Abnormal   Collection Time: 06/20/22  1:40 AM   Specimen: BLOOD  Result Value Ref Range Status   Specimen Description BLOOD LEFT ANTECUBITAL  Final   Special Requests   Final    BOTTLES DRAWN AEROBIC AND ANAEROBIC Blood Culture adequate volume   Culture  Setup Time   Final    GRAM NEGATIVE RODS AEROBIC BOTTLE ONLY CRITICAL RESULT CALLED TO, READ BACK BY AND VERIFIED WITH: T RUDISILL,PHARMD'@0136'$  06/21/22 [place] Performed at [facility], [place] [address], [place], [place]  [address]    Culture PANTOEA AGGLOMERANS (A)  Final   Report Status 06/23/2022 FINAL  Final   Organism ID, Bacteria PANTOEA AGGLOMERANS  Final      Susceptibility   Pantoea agglomerans - MIC*    CEFAZOLIN <=4 SENSITIVE Sensitive     CEFEPIME <=0.12 SENSITIVE Sensitive  CEFTAZIDIME <=1 SENSITIVE Sensitive     CEFTRIAXONE <=0.25 SENSITIVE Sensitive     CIPROFLOXACIN <=0.25 SENSITIVE Sensitive     GENTAMICIN <=1 SENSITIVE Sensitive     IMIPENEM <=0.25 SENSITIVE Sensitive     TRIMETH/SULFA <=20 SENSITIVE Sensitive     PIP/TAZO <=4 SENSITIVE Sensitive     * PANTOEA AGGLOMERANS  Blood Culture ID Panel (Reflexed)     Status: Abnormal   Collection Time: 06/20/22  1:40 AM  Result Value Ref Range Status   Enterococcus faecalis NOT DETECTED NOT DETECTED Final   Enterococcus Faecium NOT DETECTED NOT DETECTED Final   Listeria monocytogenes NOT DETECTED NOT DETECTED Final   Staphylococcus species NOT DETECTED NOT DETECTED Final   Staphylococcus aureus (BCID) NOT DETECTED NOT DETECTED Final   Staphylococcus epidermidis NOT DETECTED NOT DETECTED Final   Staphylococcus lugdunensis NOT DETECTED NOT DETECTED Final   Streptococcus species NOT DETECTED NOT DETECTED Final   Streptococcus agalactiae NOT DETECTED NOT DETECTED Final   Streptococcus pneumoniae NOT DETECTED NOT DETECTED Final   Streptococcus pyogenes NOT DETECTED NOT DETECTED Final   A.calcoaceticus-baumannii NOT DETECTED NOT DETECTED Final   Bacteroides fragilis NOT DETECTED NOT DETECTED Final   Enterobacterales DETECTED (A) NOT DETECTED Final    Comment: Enterobacterales represent a large order of gram negative bacteria, not a single organism. Refer to culture for further identification. CRITICAL RESULT CALLED TO, READ BACK BY AND VERIFIED WITH: T RUDISILL,PHARMD'@0138'$  06/21/22 [place]    Enterobacter cloacae complex NOT DETECTED NOT DETECTED Final   Escherichia coli NOT DETECTED NOT DETECTED Final   Klebsiella aerogenes NOT DETECTED NOT  DETECTED Final   Klebsiella oxytoca NOT DETECTED NOT DETECTED Final   Klebsiella pneumoniae NOT DETECTED NOT DETECTED Final   Proteus species NOT DETECTED NOT DETECTED Final   Salmonella species NOT DETECTED NOT DETECTED Final   Serratia marcescens NOT DETECTED NOT DETECTED Final   Haemophilus influenzae NOT DETECTED NOT DETECTED Final   Neisseria meningitidis NOT DETECTED NOT DETECTED Final   Pseudomonas aeruginosa NOT DETECTED NOT DETECTED Final   Stenotrophomonas maltophilia NOT DETECTED NOT DETECTED Final   Candida albicans NOT DETECTED NOT DETECTED Final   Candida auris NOT DETECTED NOT DETECTED Final   Candida glabrata NOT DETECTED NOT DETECTED Final   Candida krusei NOT DETECTED NOT DETECTED Final   Candida parapsilosis NOT DETECTED NOT DETECTED Final   Candida tropicalis NOT DETECTED NOT DETECTED Final   Cryptococcus neoformans/gattii NOT DETECTED NOT DETECTED Final   CTX-M ESBL NOT DETECTED NOT DETECTED Final   Carbapenem resistance IMP NOT DETECTED NOT DETECTED Final   Carbapenem resistance KPC NOT DETECTED NOT DETECTED Final   Carbapenem resistance NDM NOT DETECTED NOT DETECTED Final   Carbapenem resist OXA 48 LIKE NOT DETECTED NOT DETECTED Final   Carbapenem resistance VIM NOT DETECTED NOT DETECTED Final    Comment: Performed at [facility], [address] [place], [place] [address]  Blood Culture (routine x 2)     Status: None (Preliminary result)   Collection Time: 06/20/22  2:02 AM   Specimen: BLOOD  Result Value Ref Range Status   Specimen Description BLOOD RIGHT ANTECUBITAL  Final   Special Requests   Final    BOTTLES DRAWN AEROBIC AND ANAEROBIC Blood Culture adequate volume   Culture   Final    NO GROWTH 4 DAYS Performed at [facility], [place] [address] [place], [place] [address]    Report Status PENDING  Incomplete  Urine Culture  Status: Abnormal   Collection Time: 06/20/22  7:36 AM   Specimen: In/Out Cath Urine  Result Value Ref Range  Status   Specimen Description IN/OUT CATH URINE  Final   Special Requests NONE  Final   Culture (A)  Final    <10,000 COLONIES/mL INSIGNIFICANT GROWTH Performed at [facility] [address], [place], [place] [address]    Report Status 06/21/2022 FINAL  Final  C Difficile Quick Screen w PCR reflex     Status: Abnormal   Collection Time: 06/20/22  7:36 AM   Specimen: Stool  Result Value Ref Range Status   C Diff antigen POSITIVE (A) NEGATIVE Final   C Diff toxin POSITIVE (A) NEGATIVE Final   C Diff interpretation Toxin producing C. difficile detected.  Final    Comment: CRITICAL RESULT CALLED TO, READ BACK BY AND VERIFIED WITH: Sammie Bench RN ,AT [phone] 06/20/22 [place] Performed at [facility], [place] [address] [place], [place] [address]   Gastrointestinal Panel by PCR , Stool     Status: None   Collection Time: 06/20/22  7:36 AM   Specimen: Stool  Result Value Ref Range Status   Campylobacter species NOT DETECTED NOT DETECTED Final   Plesimonas shigelloides NOT DETECTED NOT DETECTED Final   Salmonella species NOT DETECTED NOT DETECTED Final   Yersinia enterocolitica NOT DETECTED NOT DETECTED Final   Vibrio species NOT DETECTED NOT DETECTED Final   Vibrio cholerae NOT DETECTED NOT DETECTED Final   Enteroaggregative E coli (EAEC) NOT DETECTED NOT DETECTED Final   Enteropathogenic E coli (EPEC) NOT DETECTED NOT DETECTED Final   Enterotoxigenic E coli (ETEC) NOT DETECTED NOT DETECTED Final   Shiga like toxin producing E coli (STEC) NOT DETECTED NOT DETECTED Final   Shigella/Enteroinvasive E coli (EIEC) NOT DETECTED NOT DETECTED Final   Cryptosporidium NOT DETECTED NOT DETECTED Final   Cyclospora cayetanensis NOT DETECTED NOT DETECTED Final   Entamoeba histolytica NOT DETECTED NOT DETECTED Final   Giardia lamblia NOT DETECTED NOT DETECTED Final   Adenovirus F40/41 NOT DETECTED NOT DETECTED Final   Astrovirus NOT DETECTED NOT DETECTED Final   Norovirus GI/GII NOT  DETECTED NOT DETECTED Final   Rotavirus A NOT DETECTED NOT DETECTED Final   Sapovirus (I, II, IV, and V) NOT DETECTED NOT DETECTED Final    Comment: Performed at [facility], [address] [place], [place] [address]  Culture, blood (Routine X 2) w Reflex to ID Panel     Status: None (Preliminary result)   Collection Time: 06/21/22  5:03 AM   Specimen: BLOOD LEFT HAND  Result Value Ref Range Status   Specimen Description BLOOD LEFT HAND  Final   Special Requests   Final    BOTTLES DRAWN AEROBIC AND ANAEROBIC Blood Culture adequate volume   Culture   Final    NO GROWTH 3 DAYS Performed at [facility], [address] [place], [place] [address]    Report Status PENDING  Incomplete  Culture, blood (Routine X 2) w Reflex to ID Panel     Status: None (Preliminary result)   Collection Time: 06/21/22  5:03 AM   Specimen: BLOOD RIGHT HAND  Result Value Ref Range Status   Specimen Description BLOOD RIGHT HAND  Final   Special Requests   Final    BOTTLES DRAWN AEROBIC AND ANAEROBIC Blood Culture adequate volume   Culture   Final    NO GROWTH 3 DAYS Performed at [facility], [place] [address], [place], [place] [address]    Report Status PENDING  Incomplete     Labs: BNP (last 3 results) Recent Labs    06/10/22 1113  BNP 948.5*   Basic Metabolic Panel: Recent Labs  Lab 06/20/22 0140 06/21/22 0105 06/22/22 0103 06/23/22 0126 06/24/22 0120  NA 139 140 140 138 142  K 5.1 3.7 4.1 3.8 3.9  CL 104 108 106 108 110  CO2 '23 23 23 '$ 21* 24  GLUCOSE 157* 115* 122* 119* 101*  BUN '18 17 21 19 14  '$ CREATININE 1.29* 1.22 1.27* 1.01 1.09  CALCIUM 9.0 8.5* 8.5* 8.4* 8.6*  MG  --   --  1.9 1.9 2.0  PHOS  --   --  2.5 2.8 2.9   Liver Function Tests: Recent Labs  Lab 06/20/22 0140  AST 26  ALT 38  ALKPHOS 95  BILITOT 0.6  PROT 6.4*  ALBUMIN 3.5   No results for input(s): "LIPASE", "AMYLASE" in the last 168 hours. No results for input(s): "AMMONIA" in the last  168 hours. CBC: Recent Labs  Lab 06/20/22 0140 06/21/22 0105 06/22/22 0103 06/23/22 0126 06/24/22 0120  WBC 17.2* 12.6* 8.7 5.0 3.6*  NEUTROABS 16.3*  --  6.6 3.7 2.2  HGB 12.4* 10.8* 10.0* 10.3* 9.9*  HCT 39.7 34.9* 30.6* 32.5* 30.0*  MCV 89.6 89.7 87.9 89.8 85.5  PLT 372 292 286 303 355   Cardiac Enzymes: No results for input(s): "CKTOTAL", "CKMB", "CKMBINDEX", "TROPONINI" in the last 168 hours. BNP: Invalid input(s): "POCBNP" CBG: No results for input(s): "GLUCAP" in the last 168 hours. D-Dimer No results for input(s): "DDIMER" in the last 72 hours. Hgb A1c No results for input(s): "HGBA1C" in the last 72 hours. Lipid Profile No results for input(s): "CHOL", "HDL", "LDLCALC", "TRIG", "CHOLHDL", "LDLDIRECT" in the last 72 hours. Thyroid function studies No results for input(s): "TSH", "T4TOTAL", "T3FREE", "THYROIDAB" in the last 72 hours.  Invalid input(s): "FREET3" Anemia work up No results for input(s): "VITAMINB12", "FOLATE", "FERRITIN", "TIBC", "IRON", "RETICCTPCT" in the last 72 hours. Urinalysis    Component Value Date/Time   COLORURINE YELLOW 06/20/2022 0736   APPEARANCEUR CLEAR 06/20/2022 0736   LABSPEC >1.046 (H) 06/20/2022 0736   PHURINE 5.0 06/20/2022 0736   GLUCOSEU NEGATIVE 06/20/2022 0736   HGBUR SMALL (A) 06/20/2022 0736   BILIRUBINUR NEGATIVE 06/20/2022 0736   KETONESUR NEGATIVE 06/20/2022 0736   PROTEINUR NEGATIVE 06/20/2022 0736   NITRITE NEGATIVE 06/20/2022 0736   LEUKOCYTESUR NEGATIVE 06/20/2022 0736   Sepsis Labs Recent Labs  Lab 06/21/22 0105 06/22/22 0103 06/23/22 0126 06/24/22 0120  WBC 12.6* 8.7 5.0 3.6*   Microbiology Recent Results (from the past 240 hour(s))  Blood Culture (routine x 2)     Status: Abnormal   Collection Time: 06/20/22  1:40 AM   Specimen: BLOOD  Result Value Ref Range Status   Specimen Description BLOOD LEFT ANTECUBITAL  Final   Special Requests   Final    BOTTLES DRAWN AEROBIC AND ANAEROBIC Blood Culture  adequate volume   Culture  Setup Time   Final    GRAM NEGATIVE RODS AEROBIC BOTTLE ONLY CRITICAL RESULT CALLED TO, READ BACK BY AND VERIFIED WITH: T RUDISILL,PHARMD'@0136'$  06/21/22 [place] Performed at [facility], [place] [address] [place], [place] [address]    Culture PANTOEA AGGLOMERANS (A)  Final   Report Status 06/23/2022 FINAL  Final   Organism ID, Bacteria PANTOEA AGGLOMERANS  Final      Susceptibility  Pantoea agglomerans - MIC*    CEFAZOLIN <=4 SENSITIVE Sensitive     CEFEPIME <=0.12 SENSITIVE Sensitive     CEFTAZIDIME <=1 SENSITIVE Sensitive     CEFTRIAXONE <=0.25 SENSITIVE Sensitive     CIPROFLOXACIN <=0.25 SENSITIVE Sensitive     GENTAMICIN <=1 SENSITIVE Sensitive     IMIPENEM <=0.25 SENSITIVE Sensitive     TRIMETH/SULFA <=20 SENSITIVE Sensitive     PIP/TAZO <=4 SENSITIVE Sensitive     * PANTOEA AGGLOMERANS  Blood Culture ID Panel (Reflexed)     Status: Abnormal   Collection Time: 06/20/22  1:40 AM  Result Value Ref Range Status   Enterococcus faecalis NOT DETECTED NOT DETECTED Final   Enterococcus Faecium NOT DETECTED NOT DETECTED Final   Listeria monocytogenes NOT DETECTED NOT DETECTED Final   Staphylococcus species NOT DETECTED NOT DETECTED Final   Staphylococcus aureus (BCID) NOT DETECTED NOT DETECTED Final   Staphylococcus epidermidis NOT DETECTED NOT DETECTED Final   Staphylococcus lugdunensis NOT DETECTED NOT DETECTED Final   Streptococcus species NOT DETECTED NOT DETECTED Final   Streptococcus agalactiae NOT DETECTED NOT DETECTED Final   Streptococcus pneumoniae NOT DETECTED NOT DETECTED Final   Streptococcus pyogenes NOT DETECTED NOT DETECTED Final   A.calcoaceticus-baumannii NOT DETECTED NOT DETECTED Final   Bacteroides fragilis NOT DETECTED NOT DETECTED Final   Enterobacterales DETECTED (A) NOT DETECTED Final    Comment: Enterobacterales represent a large order of gram negative bacteria, not a single organism. Refer to culture for further  identification. CRITICAL RESULT CALLED TO, READ BACK BY AND VERIFIED WITH: T RUDISILL,PHARMD'@0138'$  06/21/22 [place]    Enterobacter cloacae complex NOT DETECTED NOT DETECTED Final   Escherichia coli NOT DETECTED NOT DETECTED Final   Klebsiella aerogenes NOT DETECTED NOT DETECTED Final   Klebsiella oxytoca NOT DETECTED NOT DETECTED Final   Klebsiella pneumoniae NOT DETECTED NOT DETECTED Final   Proteus species NOT DETECTED NOT DETECTED Final   Salmonella species NOT DETECTED NOT DETECTED Final   Serratia marcescens NOT DETECTED NOT DETECTED Final   Haemophilus influenzae NOT DETECTED NOT DETECTED Final   Neisseria meningitidis NOT DETECTED NOT DETECTED Final   Pseudomonas aeruginosa NOT DETECTED NOT DETECTED Final   Stenotrophomonas maltophilia NOT DETECTED NOT DETECTED Final   Candida albicans NOT DETECTED NOT DETECTED Final   Candida auris NOT DETECTED NOT DETECTED Final   Candida glabrata NOT DETECTED NOT DETECTED Final   Candida krusei NOT DETECTED NOT DETECTED Final   Candida parapsilosis NOT DETECTED NOT DETECTED Final   Candida tropicalis NOT DETECTED NOT DETECTED Final   Cryptococcus neoformans/gattii NOT DETECTED NOT DETECTED Final   CTX-M ESBL NOT DETECTED NOT DETECTED Final   Carbapenem resistance IMP NOT DETECTED NOT DETECTED Final   Carbapenem resistance KPC NOT DETECTED NOT DETECTED Final   Carbapenem resistance NDM NOT DETECTED NOT DETECTED Final   Carbapenem resist OXA 48 LIKE NOT DETECTED NOT DETECTED Final   Carbapenem resistance VIM NOT DETECTED NOT DETECTED Final    Comment: Performed at [facility], [address] [place], [place] [address]  Blood Culture (routine x 2)     Status: None (Preliminary result)   Collection Time: 06/20/22  2:02 AM   Specimen: BLOOD  Result Value Ref Range Status   Specimen Description BLOOD RIGHT ANTECUBITAL  Final   Special Requests   Final    BOTTLES DRAWN AEROBIC AND ANAEROBIC Blood Culture adequate volume   Culture   Final     NO GROWTH 4 DAYS Performed at [facility], [place] [address], [place], [place] [address]    Report Status PENDING  Incomplete  Urine Culture     Status: Abnormal   Collection Time: 06/20/22  7:36 AM   Specimen: In/Out Cath Urine  Result Value Ref Range Status   Specimen Description IN/OUT CATH URINE  Final   Special Requests NONE  Final   Culture (A)  Final    <10,000 COLONIES/mL INSIGNIFICANT GROWTH Performed at [facility], [place] [address], [place], [place] [address]    Report Status 06/21/2022 FINAL  Final  C Difficile Quick Screen w PCR reflex     Status: Abnormal   Collection Time: 06/20/22  7:36 AM   Specimen: Stool  Result Value Ref Range Status   C Diff antigen POSITIVE (A) NEGATIVE Final   C Diff toxin POSITIVE (A) NEGATIVE Final   C Diff interpretation Toxin producing C. difficile detected.  Final    Comment: CRITICAL RESULT CALLED TO, READ BACK BY AND VERIFIED WITH: Sammie Bench RN ,AT [phone] 06/20/22 D. VANHOOK Performed at [facility], [place] [address], [place], [place] [address]   Gastrointestinal Panel by PCR , Stool     Status: None   Collection Time: 06/20/22  7:36 AM   Specimen: Stool  Result Value Ref Range Status   Campylobacter species NOT DETECTED NOT DETECTED Final   Plesimonas shigelloides NOT DETECTED NOT DETECTED Final   Salmonella species NOT DETECTED NOT DETECTED Final   Yersinia enterocolitica NOT DETECTED NOT DETECTED Final   Vibrio species NOT DETECTED NOT DETECTED Final   Vibrio cholerae NOT DETECTED NOT DETECTED Final   Enteroaggregative E coli (EAEC) NOT DETECTED NOT DETECTED Final   Enteropathogenic E coli (EPEC) NOT DETECTED NOT DETECTED Final   Enterotoxigenic E coli (ETEC) NOT DETECTED NOT DETECTED Final   Shiga like toxin producing E coli (STEC) NOT DETECTED NOT DETECTED Final   Shigella/Enteroinvasive E coli (EIEC) NOT DETECTED NOT DETECTED Final   Cryptosporidium NOT DETECTED NOT DETECTED Final   Cyclospora cayetanensis  NOT DETECTED NOT DETECTED Final   Entamoeba histolytica NOT DETECTED NOT DETECTED Final   Giardia lamblia NOT DETECTED NOT DETECTED Final   Adenovirus F40/41 NOT DETECTED NOT DETECTED Final   Astrovirus NOT DETECTED NOT DETECTED Final   Norovirus GI/GII NOT DETECTED NOT DETECTED Final   Rotavirus A NOT DETECTED NOT DETECTED Final   Sapovirus (I, II, IV, and V) NOT DETECTED NOT DETECTED Final    Comment: Performed at [facility], [address] [place], [place] [address]  Culture, blood (Routine X 2) w Reflex to ID Panel     Status: None (Preliminary result)   Collection Time: 06/21/22  5:03 AM   Specimen: BLOOD LEFT HAND  Result Value Ref Range Status   Specimen Description BLOOD LEFT HAND  Final   Special Requests   Final    BOTTLES DRAWN AEROBIC AND ANAEROBIC Blood Culture adequate volume   Culture   Final    NO GROWTH 3 DAYS Performed at [facility], [address], [place],  [address]    Report Status PENDING  Incomplete  Culture, blood (Routine X 2) w Reflex to ID Panel     Status: None (Preliminary result)   Collection Time: 06/21/22  5:03 AM   Specimen: BLOOD RIGHT HAND  Result Value Ref Range Status   Specimen Description BLOOD RIGHT HAND  Final   Special Requests   Final    BOTTLES DRAWN AEROBIC  AND ANAEROBIC Blood Culture adequate volume   Culture   Final    NO GROWTH 3 DAYS Performed at [facility], [place] [address] [place], [place] [address]    Report Status PENDING  Incomplete    Procedures/Studies: CT CHEST ABDOMEN PELVIS W CONTRAST  Result Date: 06/20/2022 CLINICAL DATA:  Nausea, vomiting, and diarrhea. History of pneumonia. Clinical concern for abscess. EXAM: CT CHEST, ABDOMEN, AND PELVIS WITH CONTRAST TECHNIQUE: Multidetector CT imaging of the chest, abdomen and pelvis was performed following the standard protocol during bolus administration of intravenous contrast. RADIATION DOSE REDUCTION: This exam was performed according to the  departmental dose-optimization program which includes automated exposure control, adjustment of the mA and/or kV according to patient size and/or use of iterative reconstruction technique. CONTRAST:  117m OMNIPAQUE IOHEXOL 300 MG/ML  SOLN COMPARISON:  06/10/2022. FINDINGS: CT CHEST FINDINGS Cardiovascular: The heart is normal in size and there is a small pericardial effusion. Multi-vessel coronary artery calcifications are noted. There is atherosclerotic calcification of the aorta without evidence of aneurysm. The pulmonary trunk is normal in caliber. Mediastinum/Nodes: No mediastinal, hilar, or axillary lymphadenopathy. The thyroid gland, trachea, and esophagus are within normal limits. There is a small hiatal hernia. Lungs/Pleura: There is a moderate loculated pleural effusion on the left with thickening of the adjacent pleura. There is left lower lobe consolidation and collapse with reduced lung volume. Centrilobular emphysema is noted in the lungs bilaterally. Bronchiectasis and atelectasis are present in the right lower lobe. No pneumothorax. No significant pulmonary nodule. Musculoskeletal: Degenerative changes in the thoracic spine. No acute osseous abnormality. CT ABDOMEN PELVIS FINDINGS Hepatobiliary: Stable subcentimeter hypodensities are present in the liver which are too small to further characterize. The gallbladder is without stones. No biliary ductal dilatation. Pancreas: Unremarkable. No pancreatic ductal dilatation or surrounding inflammatory changes. Spleen: Normal in size without focal abnormality. Adrenals/Urinary Tract: The adrenal glands are within normal limits. Renal cortical thinning or scarring is noted on the right. No renal calculus or hydronephrosis bilaterally. The bladder is unremarkable. Stomach/Bowel: Stomach is within normal limits. Appendix is not seen. No evidence of bowel wall thickening, distention, or inflammatory changes. No free air or pneumatosis. Scattered diverticula are  noted along the sigmoid colon without evidence of diverticulitis. Vascular/Lymphatic: Aortic atherosclerosis. No enlarged abdominal or pelvic lymph nodes. Reproductive: Enlarged prostate gland. Other: Fat containing inguinal hernias bilaterally. Small fat containing umbilical hernia. No ascites. Musculoskeletal: Degenerative changes are present in the lumbar spine in bilateral hips. No acute osseous abnormality. IMPRESSION: 1. Moderate loculated pleural effusion on the left with thickening of the pleura, concerning for empyema and not significantly changed from the prior exam. 2. Left lower lobe consolidation and collapse with reduced lung volume on the left, possible atelectasis, or infiltrate. The possibility of underlying neoplasm can not be completely excluded. 3. Emphysema. 4. No acute process in the abdomen and pelvis. 5. Additional findings as described above. Electronically Signed   By: LBrett FairyM.D.   On: 06/20/2022 05:01   DG Chest Port 1 View  Result Date: 06/20/2022 CLINICAL DATA:  Questionable sepsis - evaluate for abnormality EXAM: PORTABLE CHEST 1 VIEW COMPARISON:  Chest radiograph 06/10/2022, CT 09/04/2021 FINDINGS: Left pleural effusion and basilar opacity without significant interval change. Elevated right hemidiaphragm with patchy basilar opacity. Stable heart size and mediastinal contours. No pulmonary edema. No pneumothorax. No acute osseous abnormalities are seen. IMPRESSION: 1. Left pleural effusion and basilar opacity without significant interval change. 2. Patchy right basilar opacity may be  atelectasis or pneumonia. Electronically Signed   By: Keith Rake M.D.   On: 06/20/2022 01:30   DG Swallowing Func-Speech Pathology  Result Date: 06/11/2022 Table formatting from the original result was not included. Objective Swallowing Evaluation: Type of Study: MBS-Modified Barium Swallow Study  Patient Details Name: LABIB CWYNAR MRN: [id number] Date of Birth: 05-Mar-1930 Today's  Date: 06/11/2022 Time: SLP Start Time (ACUTE ONLY): 1340 -SLP Stop Time (ACUTE ONLY): 1400 SLP Time Calculation (min) (ACUTE ONLY): 20 min Past Medical History: Past Medical History: Diagnosis Date  Back pain   Bladder tumor   BPH (benign prostatic hyperplasia)   Hyperlipidemia   Hypertension   Impaired hearing BILATERAL HEARING AIDS  Personal history of gastric ulcer  Past Surgical History: Past Surgical History: Procedure Laterality Date  APPENDECTOMY  1957  CATARACT EXTRACTION W/ INTRAOCULAR LENS  IMPLANT, BILATERAL    CYSTOSCOPY WITH BIOPSY  03/08/2012  Procedure: CYSTOSCOPY WITH BIOPSY;  Surgeon: Claybon Jabs, MD;  Location: [facility];  Service: Urology;  Laterality: N/A;  gyrus  TONSILLECTOMY  CHILD  TRANSURETHRAL INCISION OF PROSTATE  03/08/2012  Procedure: TRANSURETHRAL INCISION OF THE PROSTATE (TUIP);  Surgeon: Claybon Jabs, MD;  Location: [facility];  Service: Urology;  Laterality: N/A;  TRANSURETHRAL RESECTION OF BLADDER TUMOR  01/26/2012  Procedure: TRANSURETHRAL RESECTION OF BLADDER TUMOR (TURBT);  Surgeon: Claybon Jabs, MD;  Location: [facility];  Service: Urology;  Laterality: N/A;  GYRUS MYTOMICIN C  HPI: Patient is a 88 y.o. 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 He presented with c/o fever, nausea, vomiting and patient c/o getting choked up while eating at times. When EMS arrived to home, patient started vomiting which continued while on way to hospital. In ED he was febrile at 102.7 degrees F, oxygen saturations as low as 89%  on RA and he was placed on 2L oxygen via nasal cannula. CXR showed chronic stable left pleural effusion with associated left basilar atelectasis or infiltrate. CT scan of the abdomen pelvis concerning for right lower lobe pneumonia with enlarging thick-walled enhancing complex left pleural effusion concerning for empyema with masslike opacity in the left lower  lobe for which neoplasm was not totally excluded. Patient made NPO awaiting SLP swallow evaluation.  Subjective: pleasant, daughter in viewing area  Recommendations for follow up therapy are one component of a multi-disciplinary discharge planning process, led by the attending physician.  Recommendations may be updated based on patient status, additional functional criteria and insurance authorization. Assessment / Plan / Recommendation   06/11/2022   3:43 PM Clinical Impressions Clinical Impression Patient presents with a mild oropharyngeal phase dysphagia and primary structural dysphagia secondary to cervical osteophytes as well as prominent cricopharyngeal bar (both noted on MBS 2019). During today's study, patient exhibited swallow initiation delay with thin liquids to level of pyriform sinus and with puree solids, nectar thick liquids, mechanical soft solids, delay was to level of vallecular sinus. Only trace to mild vallecular residuals observed with tested boluses with full clearance with subsequent swallows. As compared to previous study, patient now exhibiting consistent collection of all consistencies of barium in suspected diverticulum above cricopharyngeal bar. Barium in this outpouching would clear but never fully, however SLP did not observe any increase in amount of barium collected. Chin tuck posture did not help to clear barium. No penetration or aspiration occured with any of the tested consistencies. SLP is recommending continue with Dys  3 solids, thin liquids. SLP provided daughter with some recommendations for swallow safety and will complete education prior to discharge of patient from SLP services. SLP Visit Diagnosis Dysphagia, unspecified (R13.10) Impact on safety and function Mild aspiration risk;No limitations     06/11/2022   3:43 PM Treatment Recommendations Treatment Recommendations Therapy as outlined in treatment plan below     06/11/2022   3:51 PM Prognosis Prognosis for Safe Diet  Advancement Good Barriers to Reach Goals Time post onset   06/11/2022   3:43 PM Diet Recommendations SLP Diet Recommendations Dysphagia 3 (Mech soft) solids;Thin liquid Liquid Administration via Cup;Straw Medication Administration Whole meds with puree Compensations Slow rate;Small sips/bites;Minimize environmental distractions Postural Changes Seated upright at 90 degrees;Remain semi-upright after after feeds/meals (Comment)     06/11/2022   3:43 PM Other Recommendations Follow Up Recommendations No SLP follow up Assistance recommended at discharge Frequent or constant Supervision/Assistance Functional Status Assessment Patient has had a recent decline in their functional status and demonstrates the ability to make significant improvements in function in a reasonable and predictable amount of time.   06/11/2022   3:43 PM Frequency and Duration  Speech Therapy Frequency (ACUTE ONLY) min 1 x/week Treatment Duration 1 week     06/11/2022   3:40 PM Oral Phase Oral Phase Impaired Oral - Nectar Cup Reduced posterior propulsion Oral - Thin Cup Reduced posterior propulsion Oral - Puree Reduced posterior propulsion Oral - Mech Soft Reduced posterior propulsion;Impaired mastication    06/11/2022   3:41 PM Pharyngeal Phase Pharyngeal Phase Impaired Pharyngeal- Nectar Cup Delayed swallow initiation-vallecula;Pharyngeal residue - valleculae;Pharyngeal residue - cp segment Pharyngeal- Thin Cup Delayed swallow initiation-pyriform sinuses;Pharyngeal residue - valleculae;Pharyngeal residue - cp segment Pharyngeal- Puree Delayed swallow initiation-vallecula;Pharyngeal residue - cp segment Pharyngeal- Mechanical Soft Delayed swallow initiation-vallecula;Pharyngeal residue - cp segment    06/11/2022   3:42 PM Cervical Esophageal Phase  Cervical Esophageal Phase Impaired Nectar Cup Prominent cricopharyngeal segment Thin Cup Prominent cricopharyngeal segment Puree Prominent cricopharyngeal segment Mechanical Soft Prominent cricopharyngeal  segment Cervical Esophageal Comment appearance of questionable diverticulum above cp bar  Sonia Baller, MA, CCC-SLP Speech Therapy                     IR US CHEST  Result Date: 06/11/2022 CLINICAL DATA:  Left pleural effusion versus empyema EXAM: CHEST ULTRASOUND COMPARISON:  CT from previous day FINDINGS: Loculated small left pleural effusion with adhesion of lung to the parietal pleural surface noted, as seen on prior CT. No safe percutaneous approach was identified at the time of imaging for safe thoracentesis. IMPRESSION: Loculated small left effusion.  Thoracentesis deferred. Electronically Signed   By: Lucrezia Europe M.D.   On: 06/11/2022 12:17   CT Abdomen Pelvis W Contrast  Result Date: 06/10/2022 CLINICAL DATA:  88 year old male with history of left lower quadrant abdominal pain, vomiting and diarrhea. EXAM: CT ABDOMEN AND PELVIS WITH CONTRAST TECHNIQUE: Multidetector CT imaging of the abdomen and pelvis was performed using the standard protocol following bolus administration of intravenous contrast. RADIATION DOSE REDUCTION: This exam was performed according to the departmental dose-optimization program which includes automated exposure control, adjustment of the mA and/or kV according to patient size and/or use of iterative reconstruction technique. CONTRAST:  16m OMNIPAQUE IOHEXOL 300 MG/ML  SOLN COMPARISON:  CT the abdomen and pelvis 04/24/2020. FINDINGS: Comment: Today's study is limited by considerable patient respiratory motion. Lower chest: Incompletely imaged complex left pleural effusion with thickening and enhancement of the pleura, concerning for  potential empyema. Mass-like opacity in the left lower lobe incompletely imaged. Patchy airspace consolidation in the dependent portion of the right lower lobe. Atherosclerotic calcifications in the descending thoracic aorta as well as the right coronary artery. Hepatobiliary: Subcentimeter low-attenuation lesions in the liver are too small to  definitively characterize, but similar to the prior examination and statistically likely to represent cysts. No other larger more suspicious appearing hepatic lesions are noted. No intra or extrahepatic biliary ductal dilatation. Gallbladder is unremarkable in appearance. Pancreas: No pancreatic mass. No pancreatic ductal dilatation. No pancreatic or peripancreatic fluid collections or inflammatory changes. Spleen: Splenule inferior to the spleen, otherwise unremarkable. Adrenals/Urinary Tract: Moderate atrophy of the right kidney, similar to the prior examination. Left kidney and bilateral adrenal glands are otherwise unremarkable in appearance. No hydroureteronephrosis. Urinary bladder is normal in appearance. Stomach/Bowel: The appearance of the stomach is normal. No pathologic dilatation of small bowel or colon. Numerous colonic diverticulae are noted, particularly in the sigmoid colon, without definite focal surrounding inflammatory changes to indicate an acute diverticulitis at this time. The appendix is not confidently identified and may be surgically absent. Regardless, there are no inflammatory changes noted adjacent to the cecum to suggest the presence of an acute appendicitis at this time. Vascular/Lymphatic: Aortic atherosclerosis, without evidence of aneurysm or dissection in the abdominal or pelvic vasculature. No lymphadenopathy noted in the abdomen or pelvis. Reproductive: Prostate gland is enlarged and heterogeneous in appearance with median lobe hypertrophy. Seminal vesicles are unremarkable in appearance. Other: No significant volume of ascites.  No pneumoperitoneum. Musculoskeletal: There are no aggressive appearing lytic or blastic lesions noted in the visualized portions of the skeleton. IMPRESSION: 1. Extensive colonic diverticulosis, particularly in the sigmoid colon. No definitive inflammatory changes confidently identified to clearly indicate an acute diverticulitis at this time. 2. Right  lower lobe pneumonia. 3. Enlarging thick-walled enhancing complex left pleural effusion concerning for probable empyema, with a mass-like opacity in the left lower lobe. This could represent an area of rounded atelectasis, however, underlying neoplasm is not entirely excluded. Further clinical evaluation is recommended, in addition sampling of the pleural fluid if clinically appropriate. 4. Aortic atherosclerosis, in addition to at least right coronary artery disease. 5. Additional incidental findings, as above. Electronically Signed   By: Vinnie Langton M.D.   On: 06/10/2022 05:49   DG Chest Port 1 View  Result Date: 06/10/2022 CLINICAL DATA:  Sepsis EXAM: PORTABLE CHEST 1 VIEW COMPARISON:  01/30/2022 FINDINGS: Small, laterally loculated chronic left pleural effusion is unchanged with associated left basilar atelectasis or infiltrate contributing to retrocardiac opacification. Right lung is clear. No pneumothorax or pleural effusion on the right. Cardiac size within normal limits. Pulmonary vascularity is normal. IMPRESSION: Stable chronic left pleural effusion with associated left basilar atelectasis or infiltrate. Electronically Signed   By: Fidela Salisbury M.D.   On: 06/10/2022 03:54     Time coordinating discharge: Over 30 minutes    Dwyane Dee, MD  [facility] 06/24/2022, 3:57 PM

## 2022-06-24 NOTE — TOC Initial Note (Addendum)
Transition of Care [facility]) - Initial/Assessment Note    Patient Details  Name: Paul [name] MRN: [id number] Date of Birth: 05-11-1930  [facility] CM/SW Contact:    Marilu Favre, RN Phone Number: 06/24/2022, 3:14 PM  Clinical Narrative:                 Spoke to patient and daughter Paul [name] at bedside.   Anticipated discharge today. PT/OT recommending HHPT/OT. Patient has [facility]. Daughter wants [facility] to [facility].   NCM called Claiborne Billings with [facility] , she believes they have approval from his last admission, she will confirm and call NCM back.   Confirmed with Paul [name] they have receive Dificd at home    Asked MD to sign orders and [facility]  Notified [place] Faxed clinicals and orders to PCP MBerton LanPA [phone]  Expected Discharge Plan: [place] to [place]charge: No Barriers Identified   Patient Goals and CMS Choice Patient states their goals for this hospitalization and ongoing recovery are:: to return to home CMS Medicare.gov Compare Post Acute Care list provided to:: Patient Choice offered to / list presented to : Patient, Adult Children  Expected Discharge Plan and Services Expected Discharge Plan: [place]  Discharge Planning Services: CM Consult Post Acute Care Choice: [place]arrangements for the past 2 months: Single Family Home                 DME Arranged: N/A         HH Arranged: PT, OT HH Agency: [place] 06/24/22 Time [place] [id number] spoke with at [place] Billings Prior Living Arrangements/Services Living arrangements for the past 2 months: [place]:: Spouse Patient language and need for interpreter reviewed:: Yes Do you feel safe going back to the place where you live?: Yes      Need for Family Participation in Patient Care: Yes (Comment) Care  giver support system in place?: Yes (comment) Current home services: DME Criminal Activity/Legal Involvement Pertinent to Current Situation/Hospitalization: No - Comment as needed  Activities of Daily Living Home Assistive Devices/Equipment: Dentures (specify type), Walker (specify type), Wheelchair ADL Screening (condition at time of admission) Patient's cognitive ability adequate to safely complete daily activities?: No Is the patient deaf or have difficulty hearing?: No Does the patient have difficulty seeing, even when wearing glasses/contacts?: No Does the patient have difficulty concentrating, remembering, or making decisions?: Yes Patient able to express need for assistance with ADLs?: No Does the patient have difficulty dressing or bathing?: Yes Independently performs ADLs?: No Communication: Needs assistance Is this a change from baseline?: Pre-admission baseline Dressing (OT): Needs assistance Is this a change from baseline?: Pre-admission baseline Grooming: Needs assistance Is this a change from baseline?: Pre-admission baseline Feeding: Needs assistance Is this a change from baseline?: Pre-admission baseline Bathing: Needs assistance Is this a change from baseline?: Pre-admission baseline Toileting: Needs assistance Is this a change from baseline?: Pre-admission baseline In/Out Bed: Needs assistance Is this a change from baseline?: Pre-admission baseline Walks in Home: Needs assistance Is this a change from baseline?: Pre-admission baseline Does the patient have difficulty walking or climbing stairs?: Yes Weakness of Legs: Both Weakness of Arms/Hands: None  Permission Sought/Granted   Permission granted to share information with : Yes, Verbal Permission Granted  Share Information with NAME: daughter MThreasa [name]  Emotional Assessment Appearance:: Appears stated age Attitude/Demeanor/Rapport: Engaged Affect (typically observed): Accepting Orientation: :  Oriented to Situation, Oriented to  Time, Oriented to Place, Oriented to Self Alcohol / Substance Use: Not Applicable Psych Involvement: No (comment)  Admission diagnosis:  Diarrhea [R19.7] Diarrhea, unspecified type [R19.7] Patient Active Problem List   Diagnosis Date Noted   Bacteremia due to Gram-negative bacteria 06/21/2022   C. difficile diarrhea 06/20/2022   Renal insufficiency 06/20/2022   History of aspiration pneumonia 06/20/2022   Sepsis ([place]) 06/20/2022   Chronic diastolic CHF (congestive heart failure) ([place]) 06/20/2022   Delirium 06/13/2022   Malnutrition of moderate degree 06/12/2022   Pneumonia 06/10/2022   Loculated pleural effusion 06/10/2022   Fever 06/10/2022   Leukocytosis 06/10/2022   Dysphagia 06/10/2022   Acute respiratory failure with hypoxia ([place]) 06/10/2022   Acute CVA (cerebrovascular accident) ([place]) 01/30/2022   Pressure injury of skin 09/05/2021   HLD (hyperlipidemia) 09/05/2021   Dementia with behavioral disturbance ([place]) 09/05/2021   COVID-19 virus infection 09/04/2021   Exertional dyspnea 08/28/2015   Depressive disorder 08/28/2015   PAD (peripheral artery disease) ([place]) 05/26/2012   Dyspnea 04/09/2012   Hypertension 04/09/2012   Claudication ([place]) 04/09/2012   Transitional cell carcinoma of bladder ([place]) 01/25/2012   PCP:  Crist Infante, MD Pharmacy:   [facility] ([facility] [place] [address] [place] [address] Phone: [phone] Fax: [phone]     Social Determinants of Health (SDOH) Interventions    Readmission Risk Interventions     No data to display

## 2022-06-24 NOTE — Discharge Instructions (Signed)
Take bactrim until course is completed on 06/27/22.   Continue taking dificid while on bactrim and continue dificid after bactrim is complete to take the entire 10 day course of dificid.

## 2022-06-25 LAB — CULTURE, BLOOD (ROUTINE X 2)
Culture: NO GROWTH
Special Requests: ADEQUATE

## 2022-06-26 ENCOUNTER — Other Ambulatory Visit: Payer: PPO

## 2022-06-26 ENCOUNTER — Telehealth: Payer: Self-pay | Admitting: Cardiovascular Disease

## 2022-06-26 DIAGNOSIS — Z8551 Personal history of malignant neoplasm of bladder: Secondary | ICD-10-CM | POA: Diagnosis not present

## 2022-06-26 DIAGNOSIS — F32A Depression, unspecified: Secondary | ICD-10-CM | POA: Diagnosis not present

## 2022-06-26 DIAGNOSIS — I5030 Unspecified diastolic (congestive) heart failure: Secondary | ICD-10-CM | POA: Diagnosis not present

## 2022-06-26 DIAGNOSIS — E785 Hyperlipidemia, unspecified: Secondary | ICD-10-CM | POA: Diagnosis not present

## 2022-06-26 DIAGNOSIS — F03918 Unspecified dementia, unspecified severity, with other behavioral disturbance: Secondary | ICD-10-CM | POA: Diagnosis not present

## 2022-06-26 DIAGNOSIS — Z7982 Long term (current) use of aspirin: Secondary | ICD-10-CM | POA: Diagnosis not present

## 2022-06-26 DIAGNOSIS — Z8673 Personal history of transient ischemic attack (TIA), and cerebral infarction without residual deficits: Secondary | ICD-10-CM | POA: Diagnosis not present

## 2022-06-26 DIAGNOSIS — F0393 Unspecified dementia, unspecified severity, with mood disturbance: Secondary | ICD-10-CM | POA: Diagnosis not present

## 2022-06-26 DIAGNOSIS — Z515 Encounter for palliative care: Secondary | ICD-10-CM

## 2022-06-26 DIAGNOSIS — K259 Gastric ulcer, unspecified as acute or chronic, without hemorrhage or perforation: Secondary | ICD-10-CM | POA: Diagnosis not present

## 2022-06-26 DIAGNOSIS — D649 Anemia, unspecified: Secondary | ICD-10-CM | POA: Diagnosis not present

## 2022-06-26 DIAGNOSIS — N400 Enlarged prostate without lower urinary tract symptoms: Secondary | ICD-10-CM | POA: Diagnosis not present

## 2022-06-26 DIAGNOSIS — E440 Moderate protein-calorie malnutrition: Secondary | ICD-10-CM | POA: Diagnosis not present

## 2022-06-26 DIAGNOSIS — I5032 Chronic diastolic (congestive) heart failure: Secondary | ICD-10-CM | POA: Diagnosis not present

## 2022-06-26 DIAGNOSIS — M549 Dorsalgia, unspecified: Secondary | ICD-10-CM | POA: Diagnosis not present

## 2022-06-26 DIAGNOSIS — J9601 Acute respiratory failure with hypoxia: Secondary | ICD-10-CM | POA: Diagnosis not present

## 2022-06-26 DIAGNOSIS — N289 Disorder of kidney and ureter, unspecified: Secondary | ICD-10-CM | POA: Diagnosis not present

## 2022-06-26 DIAGNOSIS — D72829 Elevated white blood cell count, unspecified: Secondary | ICD-10-CM | POA: Diagnosis not present

## 2022-06-26 DIAGNOSIS — I110 Hypertensive heart disease with heart failure: Secondary | ICD-10-CM | POA: Diagnosis not present

## 2022-06-26 DIAGNOSIS — H9190 Unspecified hearing loss, unspecified ear: Secondary | ICD-10-CM | POA: Diagnosis not present

## 2022-06-26 DIAGNOSIS — I739 Peripheral vascular disease, unspecified: Secondary | ICD-10-CM | POA: Diagnosis not present

## 2022-06-26 DIAGNOSIS — J90 Pleural effusion, not elsewhere classified: Secondary | ICD-10-CM | POA: Diagnosis not present

## 2022-06-26 DIAGNOSIS — J189 Pneumonia, unspecified organism: Secondary | ICD-10-CM | POA: Diagnosis not present

## 2022-06-26 DIAGNOSIS — Z7902 Long term (current) use of antithrombotics/antiplatelets: Secondary | ICD-10-CM | POA: Diagnosis not present

## 2022-06-26 DIAGNOSIS — Z9181 History of falling: Secondary | ICD-10-CM | POA: Diagnosis not present

## 2022-06-26 DIAGNOSIS — R1310 Dysphagia, unspecified: Secondary | ICD-10-CM | POA: Diagnosis not present

## 2022-06-26 LAB — CULTURE, BLOOD (ROUTINE X 2)
Culture: NO GROWTH
Culture: NO GROWTH
Special Requests: ADEQUATE
Special Requests: ADEQUATE

## 2022-06-26 NOTE — Telephone Encounter (Addendum)
DPR on file Returned the call to the pts daughter Rip Harbour. Advised her that the patient was last seen in Jan 2022. Pt would need to have an appt to discuss recent hospitalization and recommendations. Pt daughter has several questions. Answered some questions to the best of my ability. Adv her that her additional questions were outside of my scope and would need to be answered by a provider. She is wondering if the appt could be scheduled with Dr. Fletcher Anon on his [place] day. Call lasted >20 min. Adv her that I would have Dr. Fletcher Anon scheduler call her to schedule.

## 2022-06-26 NOTE — Telephone Encounter (Signed)
Daughter of the patient would like Dr. Fletcher Anon to go over the patient's last two hospital visit notes and make some recommendations.  There was a new diagnosis of CHF and it shocked the daughter

## 2022-06-27 ENCOUNTER — Telehealth: Payer: Self-pay

## 2022-06-27 NOTE — Telephone Encounter (Signed)
(  4:31 pm) SW confirmed with patient's daughter telehealth visit scheduled with Dr. Mariea Clonts for 07/07/22@ 12 pm.

## 2022-06-30 DIAGNOSIS — A0472 Enterocolitis due to Clostridium difficile, not specified as recurrent: Secondary | ICD-10-CM | POA: Diagnosis not present

## 2022-06-30 DIAGNOSIS — E785 Hyperlipidemia, unspecified: Secondary | ICD-10-CM | POA: Diagnosis not present

## 2022-06-30 DIAGNOSIS — I6990 Unspecified sequelae of unspecified cerebrovascular disease: Secondary | ICD-10-CM | POA: Diagnosis not present

## 2022-06-30 DIAGNOSIS — L89212 Pressure ulcer of right hip, stage 2: Secondary | ICD-10-CM | POA: Diagnosis not present

## 2022-06-30 DIAGNOSIS — R1310 Dysphagia, unspecified: Secondary | ICD-10-CM | POA: Diagnosis not present

## 2022-06-30 DIAGNOSIS — J90 Pleural effusion, not elsewhere classified: Secondary | ICD-10-CM | POA: Diagnosis not present

## 2022-06-30 DIAGNOSIS — F0390 Unspecified dementia without behavioral disturbance: Secondary | ICD-10-CM | POA: Diagnosis not present

## 2022-06-30 DIAGNOSIS — C679 Malignant neoplasm of bladder, unspecified: Secondary | ICD-10-CM | POA: Diagnosis not present

## 2022-06-30 DIAGNOSIS — Z23 Encounter for immunization: Secondary | ICD-10-CM | POA: Diagnosis not present

## 2022-06-30 DIAGNOSIS — J189 Pneumonia, unspecified organism: Secondary | ICD-10-CM | POA: Diagnosis not present

## 2022-06-30 DIAGNOSIS — C4490 Unspecified malignant neoplasm of skin, unspecified: Secondary | ICD-10-CM | POA: Diagnosis not present

## 2022-06-30 DIAGNOSIS — I10 Essential (primary) hypertension: Secondary | ICD-10-CM | POA: Diagnosis not present

## 2022-07-01 DIAGNOSIS — N289 Disorder of kidney and ureter, unspecified: Secondary | ICD-10-CM | POA: Diagnosis not present

## 2022-07-01 DIAGNOSIS — J9601 Acute respiratory failure with hypoxia: Secondary | ICD-10-CM | POA: Diagnosis not present

## 2022-07-01 DIAGNOSIS — N400 Enlarged prostate without lower urinary tract symptoms: Secondary | ICD-10-CM | POA: Diagnosis not present

## 2022-07-01 DIAGNOSIS — K259 Gastric ulcer, unspecified as acute or chronic, without hemorrhage or perforation: Secondary | ICD-10-CM | POA: Diagnosis not present

## 2022-07-01 DIAGNOSIS — J189 Pneumonia, unspecified organism: Secondary | ICD-10-CM | POA: Diagnosis not present

## 2022-07-01 DIAGNOSIS — D72829 Elevated white blood cell count, unspecified: Secondary | ICD-10-CM | POA: Diagnosis not present

## 2022-07-01 DIAGNOSIS — E440 Moderate protein-calorie malnutrition: Secondary | ICD-10-CM | POA: Diagnosis not present

## 2022-07-01 DIAGNOSIS — Z7982 Long term (current) use of aspirin: Secondary | ICD-10-CM | POA: Diagnosis not present

## 2022-07-01 DIAGNOSIS — D649 Anemia, unspecified: Secondary | ICD-10-CM | POA: Diagnosis not present

## 2022-07-01 DIAGNOSIS — F03918 Unspecified dementia, unspecified severity, with other behavioral disturbance: Secondary | ICD-10-CM | POA: Diagnosis not present

## 2022-07-01 DIAGNOSIS — Z8551 Personal history of malignant neoplasm of bladder: Secondary | ICD-10-CM | POA: Diagnosis not present

## 2022-07-01 DIAGNOSIS — M549 Dorsalgia, unspecified: Secondary | ICD-10-CM | POA: Diagnosis not present

## 2022-07-01 DIAGNOSIS — F32A Depression, unspecified: Secondary | ICD-10-CM | POA: Diagnosis not present

## 2022-07-01 DIAGNOSIS — F0393 Unspecified dementia, unspecified severity, with mood disturbance: Secondary | ICD-10-CM | POA: Diagnosis not present

## 2022-07-01 DIAGNOSIS — J90 Pleural effusion, not elsewhere classified: Secondary | ICD-10-CM | POA: Diagnosis not present

## 2022-07-01 DIAGNOSIS — I739 Peripheral vascular disease, unspecified: Secondary | ICD-10-CM | POA: Diagnosis not present

## 2022-07-01 DIAGNOSIS — Z9181 History of falling: Secondary | ICD-10-CM | POA: Diagnosis not present

## 2022-07-01 DIAGNOSIS — Z8673 Personal history of transient ischemic attack (TIA), and cerebral infarction without residual deficits: Secondary | ICD-10-CM | POA: Diagnosis not present

## 2022-07-01 DIAGNOSIS — H9190 Unspecified hearing loss, unspecified ear: Secondary | ICD-10-CM | POA: Diagnosis not present

## 2022-07-01 DIAGNOSIS — Z7902 Long term (current) use of antithrombotics/antiplatelets: Secondary | ICD-10-CM | POA: Diagnosis not present

## 2022-07-01 DIAGNOSIS — I5030 Unspecified diastolic (congestive) heart failure: Secondary | ICD-10-CM | POA: Diagnosis not present

## 2022-07-01 DIAGNOSIS — I110 Hypertensive heart disease with heart failure: Secondary | ICD-10-CM | POA: Diagnosis not present

## 2022-07-01 DIAGNOSIS — E785 Hyperlipidemia, unspecified: Secondary | ICD-10-CM | POA: Diagnosis not present

## 2022-07-01 DIAGNOSIS — R1310 Dysphagia, unspecified: Secondary | ICD-10-CM | POA: Diagnosis not present

## 2022-07-04 DIAGNOSIS — J9601 Acute respiratory failure with hypoxia: Secondary | ICD-10-CM | POA: Diagnosis not present

## 2022-07-04 DIAGNOSIS — F0393 Unspecified dementia, unspecified severity, with mood disturbance: Secondary | ICD-10-CM | POA: Diagnosis not present

## 2022-07-04 DIAGNOSIS — E785 Hyperlipidemia, unspecified: Secondary | ICD-10-CM | POA: Diagnosis not present

## 2022-07-04 DIAGNOSIS — I110 Hypertensive heart disease with heart failure: Secondary | ICD-10-CM | POA: Diagnosis not present

## 2022-07-04 DIAGNOSIS — Z9181 History of falling: Secondary | ICD-10-CM | POA: Diagnosis not present

## 2022-07-04 DIAGNOSIS — F32A Depression, unspecified: Secondary | ICD-10-CM | POA: Diagnosis not present

## 2022-07-04 DIAGNOSIS — I739 Peripheral vascular disease, unspecified: Secondary | ICD-10-CM | POA: Diagnosis not present

## 2022-07-04 DIAGNOSIS — I5030 Unspecified diastolic (congestive) heart failure: Secondary | ICD-10-CM | POA: Diagnosis not present

## 2022-07-04 DIAGNOSIS — D72829 Elevated white blood cell count, unspecified: Secondary | ICD-10-CM | POA: Diagnosis not present

## 2022-07-04 DIAGNOSIS — F03918 Unspecified dementia, unspecified severity, with other behavioral disturbance: Secondary | ICD-10-CM | POA: Diagnosis not present

## 2022-07-04 DIAGNOSIS — N400 Enlarged prostate without lower urinary tract symptoms: Secondary | ICD-10-CM | POA: Diagnosis not present

## 2022-07-04 DIAGNOSIS — D649 Anemia, unspecified: Secondary | ICD-10-CM | POA: Diagnosis not present

## 2022-07-04 DIAGNOSIS — J189 Pneumonia, unspecified organism: Secondary | ICD-10-CM | POA: Diagnosis not present

## 2022-07-04 DIAGNOSIS — N289 Disorder of kidney and ureter, unspecified: Secondary | ICD-10-CM | POA: Diagnosis not present

## 2022-07-04 DIAGNOSIS — Z8673 Personal history of transient ischemic attack (TIA), and cerebral infarction without residual deficits: Secondary | ICD-10-CM | POA: Diagnosis not present

## 2022-07-04 DIAGNOSIS — Z7982 Long term (current) use of aspirin: Secondary | ICD-10-CM | POA: Diagnosis not present

## 2022-07-04 DIAGNOSIS — J90 Pleural effusion, not elsewhere classified: Secondary | ICD-10-CM | POA: Diagnosis not present

## 2022-07-04 DIAGNOSIS — Z7902 Long term (current) use of antithrombotics/antiplatelets: Secondary | ICD-10-CM | POA: Diagnosis not present

## 2022-07-04 DIAGNOSIS — E440 Moderate protein-calorie malnutrition: Secondary | ICD-10-CM | POA: Diagnosis not present

## 2022-07-04 DIAGNOSIS — M549 Dorsalgia, unspecified: Secondary | ICD-10-CM | POA: Diagnosis not present

## 2022-07-04 DIAGNOSIS — R1310 Dysphagia, unspecified: Secondary | ICD-10-CM | POA: Diagnosis not present

## 2022-07-04 DIAGNOSIS — K259 Gastric ulcer, unspecified as acute or chronic, without hemorrhage or perforation: Secondary | ICD-10-CM | POA: Diagnosis not present

## 2022-07-04 DIAGNOSIS — Z8551 Personal history of malignant neoplasm of bladder: Secondary | ICD-10-CM | POA: Diagnosis not present

## 2022-07-04 DIAGNOSIS — H9190 Unspecified hearing loss, unspecified ear: Secondary | ICD-10-CM | POA: Diagnosis not present

## 2022-07-07 ENCOUNTER — Other Ambulatory Visit: Payer: PPO | Admitting: Internal Medicine

## 2022-07-07 ENCOUNTER — Other Ambulatory Visit (HOSPITAL_COMMUNITY): Payer: Self-pay

## 2022-07-07 DIAGNOSIS — F03918 Unspecified dementia, unspecified severity, with other behavioral disturbance: Secondary | ICD-10-CM

## 2022-07-07 DIAGNOSIS — A0471 Enterocolitis due to Clostridium difficile, recurrent: Secondary | ICD-10-CM | POA: Diagnosis not present

## 2022-07-07 DIAGNOSIS — E440 Moderate protein-calorie malnutrition: Secondary | ICD-10-CM | POA: Diagnosis not present

## 2022-07-07 DIAGNOSIS — Z515 Encounter for palliative care: Secondary | ICD-10-CM | POA: Diagnosis not present

## 2022-07-07 DIAGNOSIS — Z8701 Personal history of pneumonia (recurrent): Secondary | ICD-10-CM | POA: Diagnosis not present

## 2022-07-07 DIAGNOSIS — I5032 Chronic diastolic (congestive) heart failure: Secondary | ICD-10-CM

## 2022-07-07 NOTE — Progress Notes (Signed)
[facility] Consult Note Telephone: [phone]  Fax: [phone]   Date of encounter: 07/07/22 9:38 AM PATIENT NAME: Paul [name] [address] [place] [address]   [phone] (home)  DOB: 1930-05-16 MRN: [id number] PRIMARY CARE PROVIDER:    Crist Infante, MD,  [place] [address] [phone]  REFERRING PROVIDER:   Crist Infante, MD [address] [place],  [place] [address] [phone]  RESPONSIBLE PARTY:    Contact Information     Name Relation Home Work Mobile   Paul [name] Daughter   [phone]   Paul [name] [phone]          Due to the COVID-19 crisis, this visit was done via telemedicine from my office and it was initiated and consent by this patient and or family.  I connected with  Paul [name] OR PROXY on 07/07/22 by a telemedicine application and verified that I am speaking with the correct person using two identifiers.  This patient did not have the ability to connect via a video-enabled capacity.   I discussed the limitations of evaluation and management by telemedicine. The patient expressed understanding and agreed to proceed.  Palliative Care was asked to follow this patient by consultation request of  Paul Infante, MD to address advance care planning and complex medical decision making. This is a follow-up visit at his daughter's request.                                    ASSESSMENT AND PLAN / RECOMMENDATIONS:   Advance Care Planning/Goals of Care: Goals include to maximize quality of life and symptom management. Patient/health care surrogate gave his/her permission to discuss.Our advance care planning conversation included a discussion about:    The value and importance of advance care planning  Experiences with loved ones who have been seriously ill or have died  Exploration of personal, cultural or spiritual beliefs that might influence medical  decisions  Exploration of goals of care in the event of a sudden injury or illness  Identification  of a healthcare agent  Review and updating or creation of an  advance directive document . Decision not to resuscitate or to de-escalate disease focused treatments due to poor prognosis. CODE STATUS:  DNR Paul [name] did try to talk with Paul [name] about avoiding hospitalizations, but she was not ready to make that decision.  He got discharged before the inpatient palliative team was able to meet with them this last time.    Symptom Management/Plan: 1. Recurrent Clostridium difficile diarrhea -to complete course of oral vanc, or, if possible, repeat difficid--they want to use vanc 3 times not 4 to avoid waking him up (on taper), but Paul [name] is going to see if [facility] could get dificid again  2. History of aspiration pneumonia -with dysphagia, prior stroke/cerebrovascular disease, continue with aspiration precautions, Paul [name] seems aware of her father's overall decline, but having challenges with rest of family accepting this  3. Chronic diastolic CHF (congestive heart failure) (HCC) -at risk for volume overload and renal failure complicating matters, monitor carefully  4. Malnutrition of moderate degree -has had challenges with this for some time and a period of weight loss again recently of about 20 lbs amid c diff -encourage small frequent meals/snacks, supplement shakes if tolerates  5. Dementia with behavioral disturbance ([place]) -underlying issue though it sounds like cognition waxes and wanes and it's less  memory and more lack of insight, judgment, functional ability in his case -his wife has had challenges accepting help in the home  6.  Palliative care:   -still haven't done MOST and we did not have the time to complete today after getting a medical update on Paul [name] after several months (family had requested to call us when needed), scheduled f/u for this purpose  08/01/2022     Follow up Palliative Care Visit: Palliative care will continue to follow for complex medical decision making, advance care planning, and clarification of goals. Return 08/02/23  This visit was coded based on medical decision making (MDM).  PPS: 40%  HOSPICE ELIGIBILITY/DIAGNOSIS:  possibly/cerebrovascular disease--need to see him physically  Chief Complaint: Palliative care follow-up telephone visit  HISTORY OF PRESENT ILLNESS:  Paul [name] is a 88 y.o. year old male  with vascular dementia, prior stroke, chronic diastolic heart failure, depression, PAD with claudication, transitional cell bladder carcinoma in remission, htn, pressure injury, malnutrition, dysphagia with aspiration met with along with daughter, Paul [name], via telephone visit.  Our social worker had reached out 7/13 to check on patient and his daughter reported recent admissions and decline and requested follow-up. I had seen him initially in late September last year but his family had not been ready to finalize a MOST form or accept more help in the home for him.  His wife had been resistant.  I see he did have [facility] after that through January of this year.  He had been ambulating with his walker.    Since then, he was hospitalized in Feb with dragging of his legs and dizziness for 24hrs--diagnosed with an acute stroke of the left medulla.  He was found to have significant cerebrovascular disease with occlusion of his distal basilar a and left vertebral artery V4 for which DAPT was recommended to 3 mos, then plavix alone along with zetia.  He had considerable delirium during the admission and haldol was needed due to severity of symptoms of agitation.  He also had a left pleural effusion present.  He was hospitalized again late last month (June) with fever, nausea and vomiting--found to have RLL pneumonia, loculated effusion and respiratory failure.  Thoracentesis was attempted but only trace fluid seen on  [place] so not amenable.  He again had considerable delirium treated with seroquel and haldol.  He was discharged home with [facility] PT.  He was hospitalized for a 3rd time from 7/7-11 with chills and fever of 101 plus new onset diarrhea and fecal incontinence just after completing his augmentin course for pneumonia --he was diagnosed with persistent pneumonia and persistent pleural effusion so treated with rocephin and zithromax,  C diff toxin and antigen were positive, he met sepsis criteria and he was treated with IVFs and dificid plus probiotics. He was once again delirious during admission.  He just finished dificid last fri am.  Last wed, stools were mushy again.  Over the weekend, Dr. Joylene Draft called in vancomycin oral for him.  Diarrhea is better.  He feels horrible.  He's become very quiet and withdrawn.  He only spoke with his brother for 45 mins.  He felt like something was still wrong in his bowels last night--he did have a semi-normal bm though.    Melanie's brother is away this week (primary caregiver).  He normally can get him in the shower to rinse him from the diarrhea.  They have set up [facility] a few times but things have interfered each  time once from the company's end and once when he had to be hospitalized.  It's for 4 hours a day 4 days per week.  Pt still is not up until 1-2pm and episodes of illness happen in the evenings after they're gone.  He requires help with all adls.  He's to get PT, OT again.  He still can transfer chair to wheelchair and walked to bathroom by himself on his walker once last week.    He does eat solid food, but has to chew really well, not talk while eating, take sips b/w and have small bites of cut up food.  He does still choke at times.  He's also on a more bland diet.  He does eat what's put before him.  They bring him to to the dining table in the wheelchair.  He does eat smaller portions vs before.    He has a couple of areas of basal cell on the cheek and  scalp--he picks at them.  His wife is trying to keep ointment and a bandaid on both.  Is meant to have an outpatient derm visit.  Is for more labs and CXR before he sees Dr. Joylene Draft.    History obtained from review of EMR, discussion with primary team, and interview with family, facility staff/caregiver and/or Mr. Ganser.   I reviewed available labs, medications, imaging, studies and related documents from the EMR.  Records reviewed and summarized above.   ROS Review of Systems see hpi  Physical Exam:  Wt Readings from Last 500 Encounters:  06/23/22 144 lb 2.9 oz (65.4 kg)  06/11/22 165 lb 5.5 oz (75 kg)  03/17/22 142 lb (64.4 kg)  01/30/22 143 lb (64.9 kg)  09/04/21 144 lb 2.9 oz (65.4 kg)  01/02/21 152 lb (68.9 kg)  06/29/20 145 lb (65.8 kg)  04/24/20 165 lb 5.5 oz (75 kg)  09/04/15 151 lb (68.5 kg)  08/28/15 151 lb 9.6 oz (68.8 kg)  05/30/14 152 lb 12.8 oz (69.3 kg)  05/24/13 154 lb 12.8 oz (70.2 kg)  11/24/12 159 lb 12.8 oz (72.5 kg)  05/26/12 154 lb 6.4 oz (70 kg)  04/09/12 154 lb (69.9 kg)  01/21/12 162 lb (73.5 kg)    CURRENT PROBLEM LIST:  Patient Active Problem List   Diagnosis Date Noted   Bacteremia due to Gram-negative bacteria 06/21/2022   C. difficile diarrhea 06/20/2022   Renal insufficiency 06/20/2022   History of aspiration pneumonia 06/20/2022   Sepsis ([place]) 06/20/2022   Chronic diastolic CHF (congestive heart failure) ([place]) 06/20/2022   Delirium 06/13/2022   Malnutrition of moderate degree 06/12/2022   Pneumonia 06/10/2022   Loculated pleural effusion 06/10/2022   Fever 06/10/2022   Leukocytosis 06/10/2022   Dysphagia 06/10/2022   Acute respiratory failure with hypoxia (HCC) 06/10/2022   Acute CVA (cerebrovascular accident) ([place]) 01/30/2022   Pressure injury of skin 09/05/2021   HLD (hyperlipidemia) 09/05/2021   Dementia with behavioral disturbance ([place]) 09/05/2021   COVID-19 virus infection 09/04/2021   Exertional dyspnea 08/28/2015    Depressive disorder 08/28/2015   PAD (peripheral artery disease) ([place]) 05/26/2012   Dyspnea 04/09/2012   Hypertension 04/09/2012   Claudication ([place]) 04/09/2012   Transitional cell carcinoma of bladder ([place]) 01/25/2012   PAST MEDICAL HISTORY:  Active Ambulatory Problems    Diagnosis Date Noted   Transitional cell carcinoma of bladder ([place]) 01/25/2012   Dyspnea 04/09/2012   Hypertension 04/09/2012   Claudication ([place]) 04/09/2012   PAD (peripheral artery disease) ([place]) 05/26/2012  Exertional dyspnea 08/28/2015   Depressive disorder 08/28/2015   COVID-19 virus infection 09/04/2021   Pressure injury of skin 09/05/2021   HLD (hyperlipidemia) 09/05/2021   Dementia with behavioral disturbance ([place]) 09/05/2021   Acute CVA (cerebrovascular accident) ([place]) 01/30/2022   Pneumonia 06/10/2022   Loculated pleural effusion 06/10/2022   Fever 06/10/2022   Leukocytosis 06/10/2022   Dysphagia 06/10/2022   Acute respiratory failure with hypoxia (HCC) 06/10/2022   Malnutrition of moderate degree 06/12/2022   Delirium 06/13/2022   C. difficile diarrhea 06/20/2022   Renal insufficiency 06/20/2022   History of aspiration pneumonia 06/20/2022   Sepsis ([place]) 06/20/2022   Chronic diastolic CHF (congestive heart failure) ([place]) 06/20/2022   Bacteremia due to Gram-negative bacteria 06/21/2022   Resolved Ambulatory Problems    Diagnosis Date Noted   No Resolved Ambulatory Problems   Past Medical History:  Diagnosis Date   Back pain    Bladder tumor    BPH (benign prostatic hyperplasia)    Hyperlipidemia    Impaired hearing BILATERAL HEARING AIDS   Personal history of gastric ulcer    SOCIAL HX:  Social History   Tobacco Use   Smoking status: Former    Years: 30.00    Types: Cigarettes    Quit date: 01/20/1981    Years since quitting: 41.4   Smokeless tobacco: Never  Substance Use Topics   Alcohol use: No   FAMILY HX:  Family History  Problem Relation Age of Onset   Kidney cancer  Mother    Hypertension Father       ALLERGIES:  Allergies  Allergen Reactions   Livalo [Pitavastatin] Other (See Comments)    Cramping and back pain   Other Diarrhea and Other (See Comments)    Unnamed patch and tablets for dementia caused diarrhea (Possibly galantamine, from outside source)       PERTINENT MEDICATIONS:  Outpatient Encounter Medications as of 07/07/2022  Medication Sig   acetaminophen (TYLENOL) 500 MG tablet Take 1,000 mg by mouth daily as needed for mild pain or headache.   clopidogrel (PLAVIX) 75 MG tablet Take 75 mg by mouth daily.   donepezil (ARICEPT) 5 MG tablet Take 5 mg by mouth daily.   escitalopram (LEXAPRO) 20 MG tablet Take 20 mg by mouth daily.   ezetimibe (ZETIA) 10 MG tablet Take 10 mg by mouth daily.   fidaxomicin (DIFICID) 200 MG TABS tablet Take 1 tablet (200 mg total) by mouth 2 (two) times daily.   isosorbide mononitrate (IMDUR) 60 MG 24 hr tablet Take 1 tablet (60 mg total) by mouth every morning. do not give if systolic blood pressure < 130 (Patient taking differently: Take 60 mg by mouth every morning.)   [DISCONTINUED] simvastatin (ZOCOR) 20 MG tablet Take 20 mg by mouth every evening.   No facility-administered encounter medications on file as of 07/07/2022.    Thank you for the opportunity to participate in the care of Mr. Idrovo.  The palliative care team will continue to follow. Please call our office at [phone] if we can be of additional assistance.   Hollace Kinnier, DO  COVID-19 PATIENT SCREENING TOOL Asked and negative response unless otherwise noted:  Have you had symptoms of covid, tested positive or been in contact with someone with symptoms/positive test in the past 5-10 days?  NO

## 2022-07-09 DIAGNOSIS — R1310 Dysphagia, unspecified: Secondary | ICD-10-CM | POA: Diagnosis not present

## 2022-07-09 DIAGNOSIS — Z8551 Personal history of malignant neoplasm of bladder: Secondary | ICD-10-CM | POA: Diagnosis not present

## 2022-07-09 DIAGNOSIS — M549 Dorsalgia, unspecified: Secondary | ICD-10-CM | POA: Diagnosis not present

## 2022-07-09 DIAGNOSIS — F32A Depression, unspecified: Secondary | ICD-10-CM | POA: Diagnosis not present

## 2022-07-09 DIAGNOSIS — E440 Moderate protein-calorie malnutrition: Secondary | ICD-10-CM | POA: Diagnosis not present

## 2022-07-09 DIAGNOSIS — I5030 Unspecified diastolic (congestive) heart failure: Secondary | ICD-10-CM | POA: Diagnosis not present

## 2022-07-09 DIAGNOSIS — F0393 Unspecified dementia, unspecified severity, with mood disturbance: Secondary | ICD-10-CM | POA: Diagnosis not present

## 2022-07-09 DIAGNOSIS — Z7902 Long term (current) use of antithrombotics/antiplatelets: Secondary | ICD-10-CM | POA: Diagnosis not present

## 2022-07-09 DIAGNOSIS — N400 Enlarged prostate without lower urinary tract symptoms: Secondary | ICD-10-CM | POA: Diagnosis not present

## 2022-07-09 DIAGNOSIS — J90 Pleural effusion, not elsewhere classified: Secondary | ICD-10-CM | POA: Diagnosis not present

## 2022-07-09 DIAGNOSIS — Z7982 Long term (current) use of aspirin: Secondary | ICD-10-CM | POA: Diagnosis not present

## 2022-07-09 DIAGNOSIS — J189 Pneumonia, unspecified organism: Secondary | ICD-10-CM | POA: Diagnosis not present

## 2022-07-09 DIAGNOSIS — I110 Hypertensive heart disease with heart failure: Secondary | ICD-10-CM | POA: Diagnosis not present

## 2022-07-09 DIAGNOSIS — Z9181 History of falling: Secondary | ICD-10-CM | POA: Diagnosis not present

## 2022-07-09 DIAGNOSIS — D649 Anemia, unspecified: Secondary | ICD-10-CM | POA: Diagnosis not present

## 2022-07-09 DIAGNOSIS — E785 Hyperlipidemia, unspecified: Secondary | ICD-10-CM | POA: Diagnosis not present

## 2022-07-09 DIAGNOSIS — N289 Disorder of kidney and ureter, unspecified: Secondary | ICD-10-CM | POA: Diagnosis not present

## 2022-07-09 DIAGNOSIS — J9601 Acute respiratory failure with hypoxia: Secondary | ICD-10-CM | POA: Diagnosis not present

## 2022-07-09 DIAGNOSIS — I739 Peripheral vascular disease, unspecified: Secondary | ICD-10-CM | POA: Diagnosis not present

## 2022-07-09 DIAGNOSIS — Z8673 Personal history of transient ischemic attack (TIA), and cerebral infarction without residual deficits: Secondary | ICD-10-CM | POA: Diagnosis not present

## 2022-07-09 DIAGNOSIS — H9190 Unspecified hearing loss, unspecified ear: Secondary | ICD-10-CM | POA: Diagnosis not present

## 2022-07-09 DIAGNOSIS — D72829 Elevated white blood cell count, unspecified: Secondary | ICD-10-CM | POA: Diagnosis not present

## 2022-07-09 DIAGNOSIS — K259 Gastric ulcer, unspecified as acute or chronic, without hemorrhage or perforation: Secondary | ICD-10-CM | POA: Diagnosis not present

## 2022-07-09 DIAGNOSIS — F03918 Unspecified dementia, unspecified severity, with other behavioral disturbance: Secondary | ICD-10-CM | POA: Diagnosis not present

## 2022-07-10 DIAGNOSIS — I639 Cerebral infarction, unspecified: Secondary | ICD-10-CM | POA: Diagnosis not present

## 2022-07-10 DIAGNOSIS — F0390 Unspecified dementia without behavioral disturbance: Secondary | ICD-10-CM | POA: Diagnosis not present

## 2022-07-10 DIAGNOSIS — L8990 Pressure ulcer of unspecified site, unspecified stage: Secondary | ICD-10-CM | POA: Diagnosis not present

## 2022-07-10 DIAGNOSIS — E785 Hyperlipidemia, unspecified: Secondary | ICD-10-CM | POA: Diagnosis not present

## 2022-07-11 DIAGNOSIS — F0393 Unspecified dementia, unspecified severity, with mood disturbance: Secondary | ICD-10-CM | POA: Diagnosis not present

## 2022-07-11 DIAGNOSIS — M549 Dorsalgia, unspecified: Secondary | ICD-10-CM | POA: Diagnosis not present

## 2022-07-11 DIAGNOSIS — Z7902 Long term (current) use of antithrombotics/antiplatelets: Secondary | ICD-10-CM | POA: Diagnosis not present

## 2022-07-11 DIAGNOSIS — I5030 Unspecified diastolic (congestive) heart failure: Secondary | ICD-10-CM | POA: Diagnosis not present

## 2022-07-11 DIAGNOSIS — J189 Pneumonia, unspecified organism: Secondary | ICD-10-CM | POA: Diagnosis not present

## 2022-07-11 DIAGNOSIS — J90 Pleural effusion, not elsewhere classified: Secondary | ICD-10-CM | POA: Diagnosis not present

## 2022-07-11 DIAGNOSIS — I110 Hypertensive heart disease with heart failure: Secondary | ICD-10-CM | POA: Diagnosis not present

## 2022-07-11 DIAGNOSIS — D649 Anemia, unspecified: Secondary | ICD-10-CM | POA: Diagnosis not present

## 2022-07-11 DIAGNOSIS — H9190 Unspecified hearing loss, unspecified ear: Secondary | ICD-10-CM | POA: Diagnosis not present

## 2022-07-11 DIAGNOSIS — Z7982 Long term (current) use of aspirin: Secondary | ICD-10-CM | POA: Diagnosis not present

## 2022-07-11 DIAGNOSIS — K259 Gastric ulcer, unspecified as acute or chronic, without hemorrhage or perforation: Secondary | ICD-10-CM | POA: Diagnosis not present

## 2022-07-11 DIAGNOSIS — E440 Moderate protein-calorie malnutrition: Secondary | ICD-10-CM | POA: Diagnosis not present

## 2022-07-11 DIAGNOSIS — Z8551 Personal history of malignant neoplasm of bladder: Secondary | ICD-10-CM | POA: Diagnosis not present

## 2022-07-11 DIAGNOSIS — I739 Peripheral vascular disease, unspecified: Secondary | ICD-10-CM | POA: Diagnosis not present

## 2022-07-11 DIAGNOSIS — D72829 Elevated white blood cell count, unspecified: Secondary | ICD-10-CM | POA: Diagnosis not present

## 2022-07-11 DIAGNOSIS — E785 Hyperlipidemia, unspecified: Secondary | ICD-10-CM | POA: Diagnosis not present

## 2022-07-11 DIAGNOSIS — R1310 Dysphagia, unspecified: Secondary | ICD-10-CM | POA: Diagnosis not present

## 2022-07-11 DIAGNOSIS — Z9181 History of falling: Secondary | ICD-10-CM | POA: Diagnosis not present

## 2022-07-11 DIAGNOSIS — F03918 Unspecified dementia, unspecified severity, with other behavioral disturbance: Secondary | ICD-10-CM | POA: Diagnosis not present

## 2022-07-11 DIAGNOSIS — F32A Depression, unspecified: Secondary | ICD-10-CM | POA: Diagnosis not present

## 2022-07-11 DIAGNOSIS — J9601 Acute respiratory failure with hypoxia: Secondary | ICD-10-CM | POA: Diagnosis not present

## 2022-07-11 DIAGNOSIS — N289 Disorder of kidney and ureter, unspecified: Secondary | ICD-10-CM | POA: Diagnosis not present

## 2022-07-11 DIAGNOSIS — N400 Enlarged prostate without lower urinary tract symptoms: Secondary | ICD-10-CM | POA: Diagnosis not present

## 2022-07-11 DIAGNOSIS — Z8673 Personal history of transient ischemic attack (TIA), and cerebral infarction without residual deficits: Secondary | ICD-10-CM | POA: Diagnosis not present

## 2022-07-14 ENCOUNTER — Encounter: Payer: Self-pay | Admitting: Internal Medicine

## 2022-07-14 DIAGNOSIS — I6990 Unspecified sequelae of unspecified cerebrovascular disease: Secondary | ICD-10-CM | POA: Diagnosis not present

## 2022-07-14 DIAGNOSIS — I10 Essential (primary) hypertension: Secondary | ICD-10-CM | POA: Diagnosis not present

## 2022-07-15 ENCOUNTER — Ambulatory Visit: Payer: PPO | Admitting: Cardiovascular Disease

## 2022-07-15 DIAGNOSIS — F03918 Unspecified dementia, unspecified severity, with other behavioral disturbance: Secondary | ICD-10-CM | POA: Diagnosis not present

## 2022-07-15 DIAGNOSIS — I739 Peripheral vascular disease, unspecified: Secondary | ICD-10-CM | POA: Diagnosis not present

## 2022-07-15 DIAGNOSIS — Z7902 Long term (current) use of antithrombotics/antiplatelets: Secondary | ICD-10-CM | POA: Diagnosis not present

## 2022-07-15 DIAGNOSIS — Z9181 History of falling: Secondary | ICD-10-CM | POA: Diagnosis not present

## 2022-07-15 DIAGNOSIS — D649 Anemia, unspecified: Secondary | ICD-10-CM | POA: Diagnosis not present

## 2022-07-15 DIAGNOSIS — Z7982 Long term (current) use of aspirin: Secondary | ICD-10-CM | POA: Diagnosis not present

## 2022-07-15 DIAGNOSIS — Z8673 Personal history of transient ischemic attack (TIA), and cerebral infarction without residual deficits: Secondary | ICD-10-CM | POA: Diagnosis not present

## 2022-07-15 DIAGNOSIS — N400 Enlarged prostate without lower urinary tract symptoms: Secondary | ICD-10-CM | POA: Diagnosis not present

## 2022-07-15 DIAGNOSIS — J9601 Acute respiratory failure with hypoxia: Secondary | ICD-10-CM | POA: Diagnosis not present

## 2022-07-15 DIAGNOSIS — M549 Dorsalgia, unspecified: Secondary | ICD-10-CM | POA: Diagnosis not present

## 2022-07-15 DIAGNOSIS — E440 Moderate protein-calorie malnutrition: Secondary | ICD-10-CM | POA: Diagnosis not present

## 2022-07-15 DIAGNOSIS — F32A Depression, unspecified: Secondary | ICD-10-CM | POA: Diagnosis not present

## 2022-07-15 DIAGNOSIS — R1310 Dysphagia, unspecified: Secondary | ICD-10-CM | POA: Diagnosis not present

## 2022-07-15 DIAGNOSIS — I110 Hypertensive heart disease with heart failure: Secondary | ICD-10-CM | POA: Diagnosis not present

## 2022-07-15 DIAGNOSIS — H9190 Unspecified hearing loss, unspecified ear: Secondary | ICD-10-CM | POA: Diagnosis not present

## 2022-07-15 DIAGNOSIS — N289 Disorder of kidney and ureter, unspecified: Secondary | ICD-10-CM | POA: Diagnosis not present

## 2022-07-15 DIAGNOSIS — E785 Hyperlipidemia, unspecified: Secondary | ICD-10-CM | POA: Diagnosis not present

## 2022-07-15 DIAGNOSIS — K259 Gastric ulcer, unspecified as acute or chronic, without hemorrhage or perforation: Secondary | ICD-10-CM | POA: Diagnosis not present

## 2022-07-15 DIAGNOSIS — J90 Pleural effusion, not elsewhere classified: Secondary | ICD-10-CM | POA: Diagnosis not present

## 2022-07-15 DIAGNOSIS — D72829 Elevated white blood cell count, unspecified: Secondary | ICD-10-CM | POA: Diagnosis not present

## 2022-07-15 DIAGNOSIS — I5030 Unspecified diastolic (congestive) heart failure: Secondary | ICD-10-CM | POA: Diagnosis not present

## 2022-07-15 DIAGNOSIS — Z8551 Personal history of malignant neoplasm of bladder: Secondary | ICD-10-CM | POA: Diagnosis not present

## 2022-07-15 DIAGNOSIS — F0393 Unspecified dementia, unspecified severity, with mood disturbance: Secondary | ICD-10-CM | POA: Diagnosis not present

## 2022-07-15 DIAGNOSIS — J189 Pneumonia, unspecified organism: Secondary | ICD-10-CM | POA: Diagnosis not present

## 2022-07-17 NOTE — Progress Notes (Signed)
COMMUNITY PALLIATIVE CARE SW NOTE  PATIENT NAME: DURANTE VIOLETT DOB: 1930-07-07 MRN: [id number]  PRIMARY CARE PROVIDER: Crist Infante, MD  RESPONSIBLE PARTY:  Acct ID - Guarantor Home Phone Work Phone Relationship Acct Type  [id number] Cleda Mccreedy* [id number]  Self P/F     [address], [place], [place] [address]   SOCIAL WORK ENCOUNTER  PC SW completed a telephonic visit with patient's daughter [name]. [name] provided an update on patient. She advised that has had 2 hospitalizations since last PC encounter.Since his discharge, he was ordered physical therapy. He has completed this with [facility]. He is scheduled to see his PCP on this Monday. He is scheduled to start in-home caregivers with [facility]. He is currently on an ABT for blood infection. He is also taking activa to combat Cdiff. His appetite is good, but he is not drinking enough fluids. He is also coughs and chokes on food and fluid. He is now a DNR. They also had a palliative care consult in the hospital. She is requesting a visit with the palliative care provider.  SW scheduled a telehealth visit with palliative care provider for 07/07/22 @ 12 pm.    Katheren Puller, LCSW

## 2022-07-21 ENCOUNTER — Encounter: Payer: PPO | Admitting: Internal Medicine

## 2022-07-21 NOTE — Progress Notes (Deleted)
[facility] Follow-Up Visit Telephone: [phone]  Fax: [phone]   Date of encounter: 07/21/22 9:18 AM PATIENT NAME: Paul [name] [address] [place] [address]   [phone] (home)  DOB: November 04, 1930 MRN: [id number] PRIMARY CARE PROVIDER:    Crist Infante, MD,  [place] [address] [phone]  REFERRING PROVIDER:   Crist Infante, MD [address] [place],  [place] [address] [phone]  RESPONSIBLE PARTY:    Contact Information     Name Relation Home Work Mobile   [place] Daughter   [phone]   Khaleef, Ruby [phone]        Due to the COVID-19 crisis, this visit was done via telemedicine from my office and it was initiated and consent by this patient and or family.  I connected with  [name] OR PROXY on 07/21/22 by a video enabled telemedicine application and verified that I am speaking with the correct person using two identifiers.   I discussed the limitations of evaluation and management by telemedicine. The patient expressed understanding and agreed to proceed.  Palliative Care was asked to follow this patient by consultation request of  Crist Infante, MD to address advance care planning and complex medical decision making. This is follow-up visit.                                     ASSESSMENT AND PLAN / RECOMMENDATIONS:   Advance Care Planning/Goals of Care: Goals include to maximize quality of life and symptom management. Patient/health care surrogate gave his/her permission to discuss.Our advance care planning conversation included a discussion about:    The value and importance of advance care planning  Experiences with loved ones who have been seriously ill or have died  Exploration of personal, cultural or spiritual beliefs that might influence medical decisions  Exploration of goals of care in the event of a sudden injury or illness   Identification  of a healthcare agent  Review and updating or creation of an  advance directive document . Decision not to resuscitate or to de-escalate disease focused treatments due to poor prognosis. CODE STATUS:  Symptom Management/Plan:    Follow up Palliative Care Visit: Palliative care will continue to follow for complex medical decision making, advance care planning, and clarification of goals. Return *** weeks or prn.  I spent *** minutes providing this consultation. More than 50% of the time in this consultation was spent in counseling and care coordination.  This visit was coded based on medical decision making (MDM).***  PPS: ***0%  HOSPICE ELIGIBILITY/DIAGNOSIS: TBD  Chief Complaint: Follow-up palliative visit  HISTORY OF PRESENT ILLNESS:  Paul [name] is a 88 y.o. year old male  with *** .   History obtained from review of EMR, discussion with primary team, and interview with family, facility staff/caregiver and/or Mr. Carranza.  I reviewed available labs, medications, imaging, studies and related documents from the EMR.  Records reviewed and summarized above.   ROS Review of Systems  Physical Exam: There were no vitals filed for this visit. There is no height or weight on file to calculate BMI. Wt Readings from Last 500 Encounters:  06/23/22 144 lb 2.9 oz (65.4 kg)  06/11/22 165 lb 5.5 oz (75 kg)  03/17/22 142 lb (64.4 kg)  01/30/22 143 lb (64.9 kg)  09/04/21 144 lb 2.9 oz (65.4 kg)  01/02/21 152  lb (68.9 kg)  06/29/20 145 lb (65.8 kg)  04/24/20 165 lb 5.5 oz (75 kg)  09/04/15 151 lb (68.5 kg)  08/28/15 151 lb 9.6 oz (68.8 kg)  05/30/14 152 lb 12.8 oz (69.3 kg)  05/24/13 154 lb 12.8 oz (70.2 kg)  11/24/12 159 lb 12.8 oz (72.5 kg)  05/26/12 154 lb 6.4 oz (70 kg)  04/09/12 154 lb (69.9 kg)  01/21/12 162 lb (73.5 kg)   Physical Exam  CURRENT PROBLEM LIST:  Patient Active Problem List   Diagnosis Date Noted   Bacteremia due to Gram-negative  bacteria 06/21/2022   C. difficile diarrhea 06/20/2022   Renal insufficiency 06/20/2022   History of aspiration pneumonia 06/20/2022   Sepsis ([place]) 06/20/2022   Chronic diastolic CHF (congestive heart failure) ([place]) 06/20/2022   Delirium 06/13/2022   Malnutrition of moderate degree 06/12/2022   Pneumonia 06/10/2022   Loculated pleural effusion 06/10/2022   Fever 06/10/2022   Leukocytosis 06/10/2022   Dysphagia 06/10/2022   Acute respiratory failure with hypoxia (HCC) 06/10/2022   Acute CVA (cerebrovascular accident) ([place]) 01/30/2022   Pressure injury of skin 09/05/2021   HLD (hyperlipidemia) 09/05/2021   Dementia with behavioral disturbance ([place]) 09/05/2021   COVID-19 virus infection 09/04/2021   Exertional dyspnea 08/28/2015   Depressive disorder 08/28/2015   PAD (peripheral artery disease) ([place]) 05/26/2012   Dyspnea 04/09/2012   Hypertension 04/09/2012   Claudication ([place]) 04/09/2012   Transitional cell carcinoma of bladder ([place]) 01/25/2012    PAST MEDICAL HISTORY:  Active Ambulatory Problems    Diagnosis Date Noted   Transitional cell carcinoma of bladder ([place]) 01/25/2012   Dyspnea 04/09/2012   Hypertension 04/09/2012   Claudication ([place]) 04/09/2012   PAD (peripheral artery disease) ([place]) 05/26/2012   Exertional dyspnea 08/28/2015   Depressive disorder 08/28/2015   COVID-19 virus infection 09/04/2021   Pressure injury of skin 09/05/2021   HLD (hyperlipidemia) 09/05/2021   Dementia with behavioral disturbance ([place]) 09/05/2021   Acute CVA (cerebrovascular accident) ([place]) 01/30/2022   Pneumonia 06/10/2022   Loculated pleural effusion 06/10/2022   Fever 06/10/2022   Leukocytosis 06/10/2022   Dysphagia 06/10/2022   Acute respiratory failure with hypoxia ([place]) 06/10/2022   Malnutrition of moderate degree 06/12/2022   Delirium 06/13/2022   C. difficile diarrhea 06/20/2022   Renal insufficiency 06/20/2022   History of aspiration pneumonia 06/20/2022   Sepsis ([place])  06/20/2022   Chronic diastolic CHF (congestive heart failure) ([place]) 06/20/2022   Bacteremia due to Gram-negative bacteria 06/21/2022   Resolved Ambulatory Problems    Diagnosis Date Noted   No Resolved Ambulatory Problems   Past Medical History:  Diagnosis Date   Back pain    Bladder tumor    BPH (benign prostatic hyperplasia)    Hyperlipidemia    Impaired hearing BILATERAL HEARING AIDS   Personal history of gastric ulcer     SOCIAL HX:  Social History   Tobacco Use   Smoking status: Former    Years: 30.00    Types: Cigarettes    Quit date: 01/20/1981    Years since quitting: 41.5   Smokeless tobacco: Never  Substance Use Topics   Alcohol use: No     ALLERGIES:  Allergies  Allergen Reactions   Livalo [Pitavastatin] Other (See Comments)    Cramping and back pain   Other Diarrhea and Other (See Comments)    Unnamed patch and tablets for dementia caused diarrhea (Possibly galantamine, from outside source)       PERTINENT MEDICATIONS:  Outpatient Encounter Medications  as of 07/21/2022  Medication Sig   acetaminophen (TYLENOL) 500 MG tablet Take 1,000 mg by mouth daily as needed for mild pain or headache.   clopidogrel (PLAVIX) 75 MG tablet Take 75 mg by mouth daily.   donepezil (ARICEPT) 5 MG tablet Take 5 mg by mouth daily.   escitalopram (LEXAPRO) 20 MG tablet Take 20 mg by mouth daily.   ezetimibe (ZETIA) 10 MG tablet Take 10 mg by mouth daily.   fidaxomicin (DIFICID) 200 MG TABS tablet Take 1 tablet (200 mg total) by mouth 2 (two) times daily.   isosorbide mononitrate (IMDUR) 60 MG 24 hr tablet Take 1 tablet (60 mg total) by mouth every morning. do not give if systolic blood pressure < 130 (Patient taking differently: Take 60 mg by mouth every morning.)   [DISCONTINUED] simvastatin (ZOCOR) 20 MG tablet Take 20 mg by mouth every evening.   No facility-administered encounter medications on file as of 07/21/2022.    Thank you for the opportunity to participate in the  care of Mr. General.  The palliative care team will continue to follow. Please call our office at [phone] if we can be of additional assistance.   Hollace Kinnier, DO  COVID-19 PATIENT SCREENING TOOL Asked and negative response unless otherwise noted:  Have you had symptoms of covid, tested positive or been in contact with someone with symptoms/positive test in the past 5-10 days? No

## 2022-07-22 DIAGNOSIS — C4441 Basal cell carcinoma of skin of scalp and neck: Secondary | ICD-10-CM | POA: Diagnosis not present

## 2022-07-22 DIAGNOSIS — Z85828 Personal history of other malignant neoplasm of skin: Secondary | ICD-10-CM | POA: Diagnosis not present

## 2022-07-22 DIAGNOSIS — C44319 Basal cell carcinoma of skin of other parts of face: Secondary | ICD-10-CM | POA: Diagnosis not present

## 2022-07-22 DIAGNOSIS — L821 Other seborrheic keratosis: Secondary | ICD-10-CM | POA: Diagnosis not present

## 2022-07-25 NOTE — Progress Notes (Signed)
This encounter was created in error - please disregard. Virtual visit had been scheduled with daughter when last spoke.  No response to messages and no show on visit.

## 2022-07-29 ENCOUNTER — Encounter (HOSPITAL_COMMUNITY): Payer: Self-pay

## 2022-07-29 ENCOUNTER — Emergency Department (HOSPITAL_BASED_OUTPATIENT_CLINIC_OR_DEPARTMENT_OTHER): Payer: PPO

## 2022-07-29 ENCOUNTER — Telehealth: Payer: Self-pay | Admitting: Cardiovascular Disease

## 2022-07-29 ENCOUNTER — Inpatient Hospital Stay (HOSPITAL_BASED_OUTPATIENT_CLINIC_OR_DEPARTMENT_OTHER)
Admission: EM | Admit: 2022-07-29 | Discharge: 2022-07-31 | DRG: 176 | Disposition: A | Discharge status: Home Health | Payer: PPO | Attending: Internal Medicine | Admitting: Internal Medicine

## 2022-07-29 DIAGNOSIS — I2693 Single subsegmental pulmonary embolism without acute cor pulmonale: Principal | ICD-10-CM | POA: Diagnosis present

## 2022-07-29 DIAGNOSIS — F0390 Unspecified dementia without behavioral disturbance: Secondary | ICD-10-CM | POA: Diagnosis not present

## 2022-07-29 DIAGNOSIS — Z8711 Personal history of peptic ulcer disease: Secondary | ICD-10-CM

## 2022-07-29 DIAGNOSIS — Z885 Allergy status to narcotic agent status: Secondary | ICD-10-CM

## 2022-07-29 DIAGNOSIS — I110 Hypertensive heart disease with heart failure: Secondary | ICD-10-CM | POA: Diagnosis not present

## 2022-07-29 DIAGNOSIS — Z9842 Cataract extraction status, left eye: Secondary | ICD-10-CM

## 2022-07-29 DIAGNOSIS — Z888 Allergy status to other drugs, medicaments and biological substances status: Secondary | ICD-10-CM

## 2022-07-29 DIAGNOSIS — Z66 Do not resuscitate: Secondary | ICD-10-CM | POA: Diagnosis not present

## 2022-07-29 DIAGNOSIS — I2609 Other pulmonary embolism with acute cor pulmonale: Secondary | ICD-10-CM | POA: Diagnosis not present

## 2022-07-29 DIAGNOSIS — A0471 Enterocolitis due to Clostridium difficile, recurrent: Secondary | ICD-10-CM | POA: Diagnosis not present

## 2022-07-29 DIAGNOSIS — Z974 Presence of external hearing-aid: Secondary | ICD-10-CM

## 2022-07-29 DIAGNOSIS — A0472 Enterocolitis due to Clostridium difficile, not specified as recurrent: Secondary | ICD-10-CM | POA: Diagnosis not present

## 2022-07-29 DIAGNOSIS — R296 Repeated falls: Secondary | ICD-10-CM | POA: Diagnosis not present

## 2022-07-29 DIAGNOSIS — R7989 Other specified abnormal findings of blood chemistry: Secondary | ICD-10-CM | POA: Diagnosis not present

## 2022-07-29 DIAGNOSIS — Z8701 Personal history of pneumonia (recurrent): Secondary | ICD-10-CM | POA: Diagnosis not present

## 2022-07-29 DIAGNOSIS — Z8249 Family history of ischemic heart disease and other diseases of the circulatory system: Secondary | ICD-10-CM | POA: Diagnosis not present

## 2022-07-29 DIAGNOSIS — I2699 Other pulmonary embolism without acute cor pulmonale: Secondary | ICD-10-CM | POA: Diagnosis not present

## 2022-07-29 DIAGNOSIS — Z961 Presence of intraocular lens: Secondary | ICD-10-CM | POA: Diagnosis present

## 2022-07-29 DIAGNOSIS — R0789 Other chest pain: Secondary | ICD-10-CM | POA: Diagnosis not present

## 2022-07-29 DIAGNOSIS — D630 Anemia in neoplastic disease: Secondary | ICD-10-CM | POA: Diagnosis not present

## 2022-07-29 DIAGNOSIS — N400 Enlarged prostate without lower urinary tract symptoms: Secondary | ICD-10-CM | POA: Diagnosis not present

## 2022-07-29 DIAGNOSIS — Z79899 Other long term (current) drug therapy: Secondary | ICD-10-CM

## 2022-07-29 DIAGNOSIS — C679 Malignant neoplasm of bladder, unspecified: Secondary | ICD-10-CM | POA: Diagnosis not present

## 2022-07-29 DIAGNOSIS — J90 Pleural effusion, not elsewhere classified: Secondary | ICD-10-CM | POA: Diagnosis not present

## 2022-07-29 DIAGNOSIS — E785 Hyperlipidemia, unspecified: Secondary | ICD-10-CM | POA: Diagnosis present

## 2022-07-29 DIAGNOSIS — H9190 Unspecified hearing loss, unspecified ear: Secondary | ICD-10-CM | POA: Diagnosis present

## 2022-07-29 DIAGNOSIS — I739 Peripheral vascular disease, unspecified: Secondary | ICD-10-CM | POA: Diagnosis not present

## 2022-07-29 DIAGNOSIS — R079 Chest pain, unspecified: Secondary | ICD-10-CM | POA: Diagnosis not present

## 2022-07-29 DIAGNOSIS — Z87891 Personal history of nicotine dependence: Secondary | ICD-10-CM | POA: Diagnosis not present

## 2022-07-29 DIAGNOSIS — I5032 Chronic diastolic (congestive) heart failure: Secondary | ICD-10-CM | POA: Diagnosis present

## 2022-07-29 DIAGNOSIS — Z8673 Personal history of transient ischemic attack (TIA), and cerebral infarction without residual deficits: Secondary | ICD-10-CM | POA: Diagnosis not present

## 2022-07-29 DIAGNOSIS — Z8051 Family history of malignant neoplasm of kidney: Secondary | ICD-10-CM

## 2022-07-29 DIAGNOSIS — Z8616 Personal history of COVID-19: Secondary | ICD-10-CM

## 2022-07-29 DIAGNOSIS — Z9841 Cataract extraction status, right eye: Secondary | ICD-10-CM

## 2022-07-29 DIAGNOSIS — Z8551 Personal history of malignant neoplasm of bladder: Secondary | ICD-10-CM

## 2022-07-29 DIAGNOSIS — Z7902 Long term (current) use of antithrombotics/antiplatelets: Secondary | ICD-10-CM

## 2022-07-29 DIAGNOSIS — Z9181 History of falling: Secondary | ICD-10-CM

## 2022-07-29 DIAGNOSIS — J439 Emphysema, unspecified: Secondary | ICD-10-CM | POA: Diagnosis not present

## 2022-07-29 LAB — PROTIME-INR
INR: 1.1 (ref 0.8–1.2)
Prothrombin Time: 14.4 seconds (ref 11.4–15.2)

## 2022-07-29 LAB — BASIC METABOLIC PANEL
Anion gap: 7 (ref 5–15)
BUN: 21 mg/dL (ref 8–23)
CO2: 28 mmol/L (ref 22–32)
Calcium: 9.1 mg/dL (ref 8.9–10.3)
Chloride: 103 mmol/L (ref 98–111)
Creatinine, Ser: 1.01 mg/dL (ref 0.61–1.24)
GFR, Estimated: >60 mL/min (ref >60)
Glucose, Bld: 112 mg/dL — ABNORMAL HIGH (ref 70–99)
Potassium: 4.2 mmol/L (ref 3.5–5.1)
Sodium: 138 mmol/L (ref 135–145)

## 2022-07-29 LAB — CBC
HCT: 37.4 % — ABNORMAL LOW (ref 39.0–52.0)
Hemoglobin: 12 g/dL — ABNORMAL LOW (ref 13.0–17.0)
MCH: 28.3 pg (ref 26.0–34.0)
MCHC: 32.1 g/dL (ref 30.0–36.0)
MCV: 88.2 fL (ref 80.0–100.0)
Platelets: 327 10*3/uL (ref 150–400)
RBC: 4.24 MIL/uL (ref 4.22–5.81)
RDW: 14.8 % (ref 11.5–15.5)
WBC: 6.7 10*3/uL (ref 4.0–10.5)
nRBC: 0 % (ref 0.0–0.2)

## 2022-07-29 LAB — BRAIN NATRIURETIC PEPTIDE: B Natriuretic Peptide: 31 pg/mL (ref 0.0–100.0)

## 2022-07-29 LAB — LACTIC ACID, PLASMA: Lactic Acid, Venous: 1.4 mmol/L (ref 0.5–1.9)

## 2022-07-29 LAB — TROPONIN I (HIGH SENSITIVITY)
Troponin I (High Sensitivity): 4 ng/L (ref <18)
Troponin I (High Sensitivity): 4 ng/L (ref <18)

## 2022-07-29 LAB — APTT: aPTT: 29 seconds (ref 24–36)

## 2022-07-29 LAB — D-DIMER, QUANTITATIVE: D-Dimer, Quant: 1.66 ug/mL-FEU — ABNORMAL HIGH (ref 0.00–0.50)

## 2022-07-29 MED ORDER — HEPARIN (PORCINE) 25000 UT/250ML-% IV SOLN
1100.0000 [IU]/h | INTRAVENOUS | Status: DC
  Administered 2022-07-29: 1100 [IU]/h via INTRAVENOUS

## 2022-07-29 MED ORDER — IOHEXOL 350 MG/ML SOLN
75.0000 mL | Freq: Once | INTRAVENOUS | Status: AC | PRN
  Administered 2022-07-29: 75 mL via INTRAVENOUS

## 2022-07-29 MED ORDER — HEPARIN BOLUS VIA INFUSION
3900.0000 [IU] | Freq: Once | INTRAVENOUS | Status: AC
  Administered 2022-07-29: 3900 [IU] via INTRAVENOUS

## 2022-07-29 NOTE — ED Provider Notes (Signed)
[place] EMERGENCY DEPT Provider Note   CSN: [id number] Arrival date & time: 07/29/22  1415     History  Chief Complaint  Patient presents with   Chest Pain    Paul [name] is a 88 y.o. male.  Patient is a 88 year old male with a past medical history of dementia, hypertension and recent hospitalizations for pneumonia and C. difficile presenting to the emergency department with chest pain and shortness of breath.  The patient states that he has had a sharp midsternal intermittent chest pain that has been on and off for the last few days.  He states that it happens frequently throughout the day and does not seem to be associated by any activities or eating.  He states he has mild associated shortness of breath.  He denies any lightheadedness or dizziness.  He states he has been nauseous but denies any vomiting.  He states that he has not had any fevers or chills or cough.  He states that the pain seems to last for seconds to minutes at a time.  The history is provided by the patient, a relative and the spouse.  Chest Pain      Home Medications Prior to Admission medications   Medication Sig Start Date End Date Taking? Authorizing Provider  acetaminophen (TYLENOL) 500 MG tablet Take 1,000 mg by mouth daily as needed for mild pain or headache.    [provider]  clopidogrel (PLAVIX) 75 MG tablet Take 75 mg by mouth daily. 01/27/20   [provider]  donepezil (ARICEPT) 5 MG tablet Take 5 mg by mouth daily.    [provider]  escitalopram (LEXAPRO) 20 MG tablet Take 20 mg by mouth daily. 01/24/20   [provider]  ezetimibe (ZETIA) 10 MG tablet Take 10 mg by mouth daily.    [provider]  fidaxomicin (DIFICID) 200 MG TABS tablet Take 1 tablet (200 mg total) by mouth 2 (two) times daily. 06/24/22   Dwyane Dee, MD  isosorbide mononitrate (IMDUR) 60 MG 24 hr tablet Take 1 tablet (60 mg total) by mouth every morning. do  not give if systolic blood pressure < 130 Patient taking differently: Take 60 mg by mouth every morning. 02/01/22   Patrecia Pour, MD  simvastatin (ZOCOR) 20 MG tablet Take 20 mg by mouth every evening.  02/27/12  [provider]      Allergies    Livalo [pitavastatin], Haldol [haloperidol], and Other    Review of Systems   Review of Systems  Cardiovascular:  Positive for chest pain.    Physical Exam Updated Vital Signs BP 128/61   Pulse (!) 57   Temp 98 F (36.7 C) (Oral)   Resp 16   Ht '5\' 8"'$  (1.727 m)   Wt 62.6 kg   SpO2 96%   BMI 20.98 kg/m  Physical Exam Vitals and nursing note reviewed.  Constitutional:      Appearance: He is well-developed. He is not toxic-appearing.  HENT:     Head: Normocephalic and atraumatic.  Eyes:     Extraocular Movements: Extraocular movements intact.  Cardiovascular:     Rate and Rhythm: Normal rate and regular rhythm.     Pulses:          Radial pulses are 2+ on the right side and 2+ on the left side.     Heart sounds: Normal heart sounds.  Pulmonary:     Effort: Pulmonary effort is normal. No accessory muscle usage.  Breath sounds: Normal breath sounds.  Chest:     Chest wall: No tenderness.  Abdominal:     Palpations: Abdomen is soft.     Tenderness: There is no abdominal tenderness.  Musculoskeletal:     Cervical back: Normal range of motion and neck supple.     Right lower leg: No edema.     Left lower leg: No edema.  Skin:    General: Skin is warm and dry.  Neurological:     General: No focal deficit present.     Mental Status: He is alert and oriented to person, place, and time.  Psychiatric:        Mood and Affect: Mood normal.        Behavior: Behavior normal.     ED Results / Procedures / Treatments   Labs (all labs ordered are listed, but only abnormal results are displayed) Labs Reviewed  BASIC METABOLIC PANEL - Abnormal; Notable for the following components:      Result Value   Glucose, Bld 112  (*)    All other components within normal limits  CBC - Abnormal; Notable for the following components:   Hemoglobin 12.0 (*)    HCT 37.4 (*)    All other components within normal limits  D-DIMER, QUANTITATIVE - Abnormal; Notable for the following components:   D-Dimer, Quant 1.66 (*)    All other components within normal limits  BRAIN NATRIURETIC PEPTIDE  PROTIME-INR  APTT  LACTIC ACID, PLASMA  HEPARIN LEVEL (UNFRACTIONATED)  TROPONIN I (HIGH SENSITIVITY)  TROPONIN I (HIGH SENSITIVITY)    EKG EKG Interpretation  Date/Time:  Tuesday July 29 2022 14:28:14 EDT Ventricular Rate:  63 PR Interval:  178 QRS Duration: 90 QT Interval:  396 QTC Calculation: 405 R Axis:   -4 Text Interpretation: Normal sinus rhythm Cannot rule out Anteroseptal infarct , age undetermined Abnormal ECG No significant change since last tracing Confirmed by Oneal Deputy [phone]) on 07/29/2022 2:59:43 PM  Radiology CT Angio Chest PE W and/or Wo Contrast  Result Date: 07/29/2022 CLINICAL DATA:  Positive D-dimer.  Chest pain. EXAM: CT ANGIOGRAPHY CHEST WITH CONTRAST TECHNIQUE: Multidetector CT imaging of the chest was performed using the standard protocol during bolus administration of intravenous contrast. Multiplanar CT image reconstructions and MIPs were obtained to evaluate the vascular anatomy. RADIATION DOSE REDUCTION: This exam was performed according to the departmental dose-optimization program which includes automated exposure control, adjustment of the mA and/or kV according to patient size and/or use of iterative reconstruction technique. CONTRAST:  89m OMNIPAQUE IOHEXOL 350 MG/ML SOLN COMPARISON:  CT chest abdomen and pelvis 06/20/2022 FINDINGS: Cardiovascular: There are findings concerning for subsegmental pulmonary emboli in the left lower lobe in the region of focal lung opacity. This is new from prior examination. No other pulmonary emboli are seen. Aorta is ectatic without focal dilatation.  Heart size is within normal limits. There is no pericardial effusion. There are atherosclerotic calcifications of the aorta. Mediastinum/Nodes: No enlarged mediastinal, hilar, or axillary lymph nodes. Thyroid gland, trachea, and esophagus demonstrate no significant findings. Lungs/Pleura: Mild emphysematous changes are again seen. Small to moderate-sized left pleural effusion with diffuse pleural thickening appears similar to the prior examination. Adjacent oval parenchymal density in the left lower lobe measures 2.3 x 6.7 cm it also appears unchanged. No air bronchograms seen in this region. No new focal lung infiltrate or pneumothorax. Trachea and central airways are patent. Upper Abdomen: No acute abnormality. Musculoskeletal: No chest wall abnormality. No acute osseous findings.  Degenerative changes affect the spine. Review of the MIP images confirms the above findings. IMPRESSION: 1. Subsegmental left lower lobe pulmonary emboli in the region of focal lung opacity, new from prior. 2. Positive for acute PE with CTevidence of right heart strain (RV/LV Ratio = 1.3) consistent with at least submassive (intermediate risk) PE. The presence of right heart strain has been associated with an increased risk of morbidity and mortality. 3. Stable small to moderate-sized left pleural effusion with diffuse pleural thickening. Findings may be related to empyema. 4. Stable left lower lobe consolidation, possibly rounded atelectasis. Underlying mass not excluded. Aortic Atherosclerosis (ICD10-I70.0) and Emphysema (ICD10-J43.9). Electronically Signed   By: Ronney Asters M.D.   On: 07/29/2022 18:11   DG Chest Port 1 View  Result Date: 07/29/2022 CLINICAL DATA:  Chest pain. EXAM: PORTABLE CHEST 1 VIEW COMPARISON:  06/20/2022 FINDINGS: Heart size and mediastinal contours are stable. Aortic atherosclerotic calcifications. Unchanged appearance of moderate loculated left pleural effusion with overlying atelectasis/consolidation.  No new findings identified. Visualized osseous structures appear intact. IMPRESSION: 1. Stable loculated left pleural effusion with overlying atelectasis/consolidation. 2. No new findings. Electronically Signed   By: Kerby Moors M.D.   On: 07/29/2022 15:14    Procedures Procedures    Medications Ordered in ED Medications  heparin ADULT infusion 100 units/mL (25000 units/220m) (1,100 Units/hr Intravenous New Bag/Given 07/29/22 1902)  iohexol (OMNIPAQUE) 350 MG/ML injection 75 mL (75 mLs Intravenous Contrast Given 07/29/22 1750)  heparin bolus via infusion 3,900 Units (3,900 Units Intravenous Bolus from Bag 07/29/22 1906)    ED Course/ Medical Decision Making/ A&P Clinical Course as of 07/29/22 1939  Tue Jul 29, 2022  1843 Patient's CT PE shows a PE with evidence of right heart strain.  He is remained hemodynamically stable here satting well on room air with a negative troponin.  Due to patient's age and right heart strain, intensivist was consulted and reviewed the case.  She states he should be stable for progressive care unit and they will follow as a consult.  Patient will be started on a heparin drip. [VK]  1934 Patient was [facility] by hospitalist [facility]  He will be transferred as bed becomes available. [VK]    Clinical Course User Index [VK] KOttie Glazier DO                           Medical Decision Making This patient presents to the ED with chief complaint(s) of chest pain with pertinent past medical history of hypertension, recent hospitalization for pneumonia and C. difficile which further complicates the presenting complaint. The complaint involves an extensive differential diagnosis and also carries with it a high risk of complications and morbidity.    The differential diagnosis includes ACS, arrhythmia, pneumonia, pneumothorax, worsening pleural effusion, PE due to his recent hospitalization and immobility  Additional history obtained:  N/A Records reviewed previous admission documents  ED Course and Reassessment: Patient was initially evaluated by triage orders and EKG had no acute ischemic changes.  Initial troponin was negative.  Due to patient's risk factors for PE in the setting of his recent hospitalization a D-dimer will be performed.  Patient is hemodynamically stable here.  Patient's D-dimer was elevated and will have a CT PE performed  Independent labs interpretation:  The following labs were independently interpreted: D-dimer is elevated, CT PE will be ordered  Independent visualization of imaging: - I independently visualized the  following imaging with scope of interpretation limited to determining acute life threatening conditions related to emergency care: Chest x-ray, CT PE, which revealed chest x-ray showed stable appearing loculated left pleural effusion, CT PE shows subsegmental PE on the left with evidence of right heart strain  Consultation: - Consulted or discussed management/test interpretation w/ external professional: Intensivist, hospitalist  Consideration for admission or further workup: Due to patient's PE with evidence of right heart strain, he will require admission.  He will be started on a heparin drip. Social Determinants of health: N/A    Amount and/or Complexity of Data Reviewed Labs: ordered. Radiology: ordered.  Risk Prescription drug management. Decision regarding hospitalization.           Final Clinical Impression(s) / ED Diagnoses Final diagnoses:  Single subsegmental pulmonary embolism without acute cor pulmonale [facility])    Rx / DC Orders ED Discharge Orders     None         Ottie Glazier, DO 07/29/22 1939

## 2022-07-29 NOTE — Progress Notes (Signed)
ANTICOAGULATION CONSULT NOTE - Initial Consult  Pharmacy Consult for heparin Indication: pulmonary embolus  Allergies  Allergen Reactions   Livalo [Pitavastatin] Other (See Comments)    Cramping and back pain   Other Diarrhea and Other (See Comments)    Unnamed patch and tablets for dementia caused diarrhea (Possibly galantamine, from outside source)     Patient Measurements: Height: '5\' 8"'$  (172.7 cm) Weight: 62.6 kg (138 lb) IBW/kg (Calculated) : 68.4 Heparin Dosing Weight: TBW  Vital Signs: Temp: 98 F (36.7 C) (08/15 1427) Temp Source: Oral (08/15 1427) BP: 122/57 (08/15 1730) Pulse Rate: 56 (08/15 1730)  Labs: Recent Labs    07/29/22 1436 07/29/22 1620  HGB 12.0*  --   HCT 37.4*  --   PLT 327  --   CREATININE 1.01  --   TROPONINIHS 4 4    Estimated Creatinine Clearance: 43 mL/min (by C-G formula based on SCr of 1.01 mg/dL).   Medical History: Past Medical History:  Diagnosis Date   Back pain    Bladder tumor    BPH (benign prostatic hyperplasia)    Hyperlipidemia    Hypertension    Impaired hearing BILATERAL HEARING AIDS   Personal history of gastric ulcer     Assessment: 37 YOM presenting with CP, CT angio chest with PE with RHS, he is not on anticoagulation PTA  Goal of Therapy:  Heparin level 0.3-0.7 units/ml Monitor platelets by anticoagulation protocol: Yes   Plan:  Heparin 3900 units IV x 1, and gtt at 1100 units/hr F/u 8 hour heparin level F/u long term [facility] plan  Bertis Ruddy, PharmD Clinical Pharmacist ED Pharmacist Phone # [phone] 07/29/2022 6:39 PM

## 2022-07-29 NOTE — Telephone Encounter (Signed)
DPR on file Spoke with the patients daughter [name].  Pt was last seen by Dr. Fletcher Anon in Jan 20222.  They have just arrived at the [place] [facility] [place] at the recommendation of the pt pcp. [name] sts that they do not want to wait until tomorrow for the patient to be seen.  Pt complains of intermittent stabbing chest pain.  They will call back to schedule an appt at another time if needed.

## 2022-07-29 NOTE — Consult Note (Signed)
NAME:  Paul [name], MRN:  [id number], DOB:  05-19-1930, LOS: 0 ADMISSION DATE:  07/29/2022, CONSULTATION DATE:  07/29/22 REFERRING MD:  Velia Meyer CHIEF COMPLAINT:  PE   History of Present Illness:  Paul [name] is a 88 y.o. male who has a PMH as below. He presented to [facility] 8/15 with chest pain that began 2 days prior and progressed. He initially called cardiology office and was scheduled for an appointment 8/16; however, then had stabbing pain 8/15 so went to ED for further evaluation.  In ED, CTA chest demonstrated subsegmental LLL PE with R/LV of 1.3. He also had stable small to mod left pleural effusion with pleural thickening, possibly empyema.  He was started on heparin infusion and was admitted to [facility]. PCCM called in consultation. He has been hemodynamically stable and has been on room air.  Pertinent  Medical History:  has Transitional cell carcinoma of bladder ([place]); Dyspnea; Hypertension; Claudication [facility]); PAD (peripheral artery disease) ([place]); Exertional dyspnea; Depressive disorder; COVID-19 virus infection; Pressure injury of skin; HLD (hyperlipidemia); Dementia with behavioral disturbance ([place]); Acute CVA (cerebrovascular accident) ([place]); Pneumonia; Loculated pleural effusion; Fever; Leukocytosis; Dysphagia; Acute respiratory failure with hypoxia ([place]); Malnutrition of moderate degree; Delirium; C. difficile diarrhea; Renal insufficiency; History of aspiration pneumonia; Sepsis ([place]); Chronic diastolic CHF (congestive heart failure) ([place]); and Bacteremia due to Gram-negative bacteria on their problem list.  Significant Hospital Events: Including procedures, antibiotic start and stop dates in addition to other pertinent events   8/15 admit  Interim History / Subjective:  Comfortable on room air. No complaints. No chest pain currently.  Objective:  Blood pressure 128/61, pulse (!) 57, temperature 98 F (36.7 C), temperature source Oral, resp. rate 16, height '5\' 8"'$   (1.727 m), weight 62.6 kg, SpO2 96 %.       No intake or output data in the 24 hours ending 07/29/22 1925 Filed Weights   07/29/22 1429  Weight: 62.6 kg    Examination: General: Elderly male, resting in bed, in NAD. Neuro: A&O x 3, no deficits. HEENT: [place]/AT. Sclerae anicteric. EOMI. Cardiovascular: Loletha Grayer, regular, no M/R/G.  Lungs: Respirations even and unlabored.  CTA bilaterally, No W/R/R. Abdomen: BS x 4, soft, NT/ND.  Musculoskeletal: No gross deformities, no edema.  Skin: Intact, warm, no rashes.   Labs/imaging personally reviewed:  CTA chest 8/15 > subsegmental LLL PE with R/LV of 1.3. He also had stable small to mod left pleural effusion with pleural thickening, possibly empyema.  Assessment & Plan:   Acute PE - unclear etiology. CTA with some RV strain but fortunately, pt HD stable on room air with reassuring troponin of 4. - Continue heparin infusion but consider change to Lovenox to achieve therapeutic range more quickly. - No role systemic lytics, CDL, or thrombectomy at this point. - Transition to DOAC vs Warfarin x minimal 3 months (assess bleeding risk given age). - Assess LE duplex and echo and if significant heart strain then can consider CDL. - F/u BNP.  Small left pleural effusion, chronic - ? Empyema per CT but labs reassuring with normal WBC, lactate, PCT and empyema seems very unlikely given the chronicity of effusion. Likely has some component of trapped lung and rounded atelectasis given chronicity. - Supportive care.   Rest per primary team. Nothing further to add.  PCCM will sign off.  Please call us back if we can be of any further assistance.  Best practice (evaluated daily):  Per primary team.  Labs   CBC:  Recent Labs  Lab 07/29/22 1436  WBC 6.7  HGB 12.0*  HCT 37.4*  MCV 88.2  PLT 109    Basic Metabolic Panel: Recent Labs  Lab 07/29/22 1436  NA 138  K 4.2  CL 103  CO2 28  GLUCOSE 112*  BUN 21  CREATININE 1.01  CALCIUM 9.1    GFR: Estimated Creatinine Clearance: 43 mL/min (by C-G formula based on SCr of 1.01 mg/dL). Recent Labs  Lab 07/29/22 1436  WBC 6.7    Liver Function Tests: No results for input(s): "AST", "ALT", "ALKPHOS", "BILITOT", "PROT", "ALBUMIN" in the last 168 hours. No results for input(s): "LIPASE", "AMYLASE" in the last 168 hours. No results for input(s): "AMMONIA" in the last 168 hours.  ABG    Component Value Date/Time   TCO2 28 01/30/2022 1716     Coagulation Profile: Recent Labs  Lab 07/29/22 1857  INR 1.1    Cardiac Enzymes: No results for input(s): "CKTOTAL", "CKMB", "CKMBINDEX", "TROPONINI" in the last 168 hours.  HbA1C: Hgb A1c MFr Bld  Date/Time Value Ref Range Status  01/31/2022 02:50 AM 5.3 4.8 - 5.6 % Final    Comment:    (NOTE) Pre diabetes:          5.7%-6.4%  Diabetes:              >6.4%  Glycemic control for   <7.0% adults with diabetes     CBG: No results for input(s): "GLUCAP" in the last 168 hours.  Review of Systems:   All negative; except for those that are bolded, which indicate positives.  Constitutional: weight loss, weight gain, night sweats, fevers, chills, fatigue, weakness.  HEENT: headaches, sore throat, sneezing, nasal congestion, post nasal drip, difficulty swallowing, tooth/dental problems, visual complaints, visual changes, ear aches. Neuro: difficulty with speech, weakness, numbness, ataxia. CV:  chest pain, orthopnea, PND, swelling in lower extremities, dizziness, palpitations, syncope.  Resp: cough, hemoptysis, dyspnea, wheezing. GI: heartburn, indigestion, abdominal pain, nausea, vomiting, diarrhea, constipation, change in bowel habits, loss of appetite, hematemesis, melena, hematochezia.  GU: dysuria, change in color of urine, urgency or frequency, flank pain, hematuria. MSK: joint pain or swelling, decreased range of motion. Psych: change in mood or affect, depression, anxiety, suicidal ideations, homicidal  ideations. Skin: rash, itching, bruising.   Past Medical History:  He,  has a past medical history of Back pain, Bladder tumor, BPH (benign prostatic hyperplasia), Hyperlipidemia, Hypertension, Impaired hearing (BILATERAL HEARING AIDS), and Personal history of gastric ulcer.   Surgical History:   Past Surgical History:  Procedure Laterality Date   APPENDECTOMY  1957   CATARACT EXTRACTION W/ INTRAOCULAR LENS  IMPLANT, BILATERAL     CYSTOSCOPY WITH BIOPSY  03/08/2012   Procedure: CYSTOSCOPY WITH BIOPSY;  Surgeon: Claybon Jabs, MD;  Location[facility];  Service: Urology;  Laterality: N/A;  gyrus   TONSILLECTOMY  CHILD   TRANSURETHRAL INCISION OF PROSTATE  03/08/2012   Procedure: TRANSURETHRAL INCISION OF THE PROSTATE (TUIP);  Surgeon: Claybon Jabs, MD;  Location[facility];  Service: Urology;  Laterality: N/A;   TRANSURETHRAL RESECTION OF BLADDER TUMOR  01/26/2012   Procedure: TRANSURETHRAL RESECTION OF BLADDER TUMOR (TURBT);  Surgeon: Claybon Jabs, MD;  Location[facility];  Service: Urology;  Laterality: N/A;  GYRUS MYTOMICIN C      Social History:   reports that he quit smoking about 41 years ago. His smoking use included cigarettes. He has never used smokeless tobacco. He reports  that he does not drink alcohol and does not use drugs.   Family History:  His family history includes Hypertension in his father; Kidney cancer in his mother.   Allergies Allergies  Allergen Reactions   Livalo [Pitavastatin] Other (See Comments)    Cramping and back pain   Haldol [Haloperidol] Other (See Comments)    Family does not like his affect when he is given this - makes him a "zombie."   Other Diarrhea and Other (See Comments)    Unnamed patch and tablets for dementia caused diarrhea (Possibly galantamine, from outside source)      Home Medications  Prior to Admission medications   Medication Sig Start Date End Date Taking?  Authorizing Provider  acetaminophen (TYLENOL) 500 MG tablet Take 1,000 mg by mouth daily as needed for mild pain or headache.    [provider]  clopidogrel (PLAVIX) 75 MG tablet Take 75 mg by mouth daily. 01/27/20   [provider]  donepezil (ARICEPT) 5 MG tablet Take 5 mg by mouth daily.    [provider]  escitalopram (LEXAPRO) 20 MG tablet Take 20 mg by mouth daily. 01/24/20   [provider]  ezetimibe (ZETIA) 10 MG tablet Take 10 mg by mouth daily.    [provider]  fidaxomicin (DIFICID) 200 MG TABS tablet Take 1 tablet (200 mg total) by mouth 2 (two) times daily. 06/24/22   Dwyane Dee, MD  isosorbide mononitrate (IMDUR) 60 MG 24 hr tablet Take 1 tablet (60 mg total) by mouth every morning. do not give if systolic blood pressure < 130 Patient taking differently: Take 60 mg by mouth every morning. 02/01/22   Patrecia Pour, MD  simvastatin (ZOCOR) 20 MG tablet Take 20 mg by mouth every evening.  02/27/12  [provider]      Montey Hora, [place] For pager details, please see AMION or use Epic chat  After 1900, please call [facility] for cross coverage needs 07/30/2022, 2:07 AM

## 2022-07-29 NOTE — ED Notes (Signed)
Pts family also requests a phone call if at any point the Pt starts to become confrontational/sundowners. They would drive to the hospital to assist in controlling the Pt.

## 2022-07-29 NOTE — Telephone Encounter (Signed)
Pt c/o of Chest Pain: STAT if CP now or developed within 24 hours  1. Are you having CP right now? No   2. Are you experiencing any other symptoms (ex. SOB, nausea, vomiting, sweating)? No   3. How long have you been experiencing CP? Since Sunday 6/13  4. Is your CP continuous or coming and going? Coming and going   5. Have you taken Nitroglycerin? No    Pt daughter called stating that pt has been experiencing some chest pain since Sunday.She states they are going out of town this week and wanted him to be seen before they do. Pt scheduled an appt for tomorrow with Almyra Deforest, PA at 10:30am.

## 2022-07-29 NOTE — ED Notes (Addendum)
Family stated that the Pt has Dementia, acting his normal. No record in his chart. CAOx1 his wife's name (What's this ladies name?)

## 2022-07-29 NOTE — Progress Notes (Signed)
Transferring facility: [facility] Requesting provider: Dr. Ernesto Rutherford (EDP at [facility]) Reason for transfer: admission for further evaluation and management of acute pulmonary embolism with evidence of right heart strain on CTA chest.   88 year old male who presented to [facility] ED on 07/29/2022 complaining of 2- 3 days of progressive shortness of breath associated with pleuritic left-sided chest discomfort.  CTA chest showed evidence of acute left-sided pulmonary embolism with evidence of right-sided heart strain.  The patient had recently been hospitalized for pneumonia as well as C. difficile.  Vital signs in the ED were notable for the following: Normotensive; oxygen saturations in the high 90s on room air.  Labs were notable for negative troponin.  EKG reportedly showed sinus rhythm without evidence of acute ischemic changes.  EDP discussed patient's case with the on-call PCCM provider, who, given the patient's hemodynamic stability, recommended admission to the hospitalist service to PCU bed at [facility].  PCCM recommended initiation heparin drip, and agreed to consult.  Heparin drip initiated at [facility] this evening.   Subsequently, I accepted this patient for transfer for observation to a pcu bed at [facility] for further work-up and management of acute pulmonary embolism with CTA evidence of right sided heart strain.       Check [email or web address] for on-call coverage.   Nursing staff, Please call [place] number on Amion as soon as patient's arrival, so appropriate admitting provider can evaluate the pt.     Babs Bertin, DO Hospitalist

## 2022-07-29 NOTE — ED Notes (Signed)
Story from Pt and Family --- CP started Sunday, Center of Chest. Non radiating, rates it a 5/10, constant, only increases while moving. Recent dx of CDIF and a Plural Effusion on the left. July also had a dx of Pneumonia, current xray does not show.

## 2022-07-29 NOTE — Progress Notes (Signed)
07/29/2022 APP Rahul Shearon Stalls and I saw and evaluated the patient. Discussed with them and agree with their findings and plan as documented in the their note.  S:  89yM with history of bladder CA, dementia, CVA, chronic diastolic heart failure, recent hospitalizations for PNA and C diff who presented with pleuritic CP found to have SSPE, CT with RV/LV 1.3. Started on heparin gtt.   O: Blood pressure (!) 163/68, pulse (!) 58, temperature 97.8 F (36.6 C), temperature source Oral, resp. rate 16, height '5\' 8"'$  (1.727 m), weight 62.6 kg, SpO2 95 %.   Exam: Gen:       No acute distress HEENT:  tracking, sclera anicteric Neck:      No masses, JVD Lungs:    Clear to auscultation bilaterally; normal respiratory effort CV:         Regular rate and rhythm; no murmurs Abd:      + bowel sounds; soft, non-tender; no palpable masses, no distension Ext:    No edema; adequate peripheral perfusion Skin:       Warm and dry; no rash Neuro:    attentive, appropriate, grossly nonfocal  CTA Chest with chronic/stable left effusion, SSPE LLL, likely rounded atelectasis  A:  # Subsegmental PE # RV/LV ratio 1.3 - prior TTE with elevated RVSP which could be related to HFpEF and age. Seems unlikely SSPE would alter hemodynamics substantially. # Chronic left pleural effusion  P:  - would change to lovenox to achieve therapeutic range more quickly/reliably - consider doac before discharge but a 3 month course of AC for SSPE if there is no lower extremity clot must be weighed against bleeding risk with falls/age - TTE - US DVT BLE  Will sign off but glad to be reinvolved as condition changes   [place]

## 2022-07-29 NOTE — ED Notes (Signed)
Patient's daughter, Gerald Stabs, would like to receive a phone call upon bed assignment. Phone #: [phone]

## 2022-07-29 NOTE — ED Notes (Signed)
Paul [name] [phone] daughter

## 2022-07-30 ENCOUNTER — Observation Stay (HOSPITAL_COMMUNITY): Payer: PPO

## 2022-07-30 ENCOUNTER — Encounter (HOSPITAL_COMMUNITY): Payer: Self-pay | Admitting: Internal Medicine

## 2022-07-30 ENCOUNTER — Ambulatory Visit: Payer: PPO | Admitting: Physician Assistant

## 2022-07-30 ENCOUNTER — Other Ambulatory Visit: Payer: Self-pay

## 2022-07-30 DIAGNOSIS — Z974 Presence of external hearing-aid: Secondary | ICD-10-CM | POA: Diagnosis not present

## 2022-07-30 DIAGNOSIS — I2699 Other pulmonary embolism without acute cor pulmonale: Secondary | ICD-10-CM | POA: Diagnosis present

## 2022-07-30 DIAGNOSIS — I2609 Other pulmonary embolism with acute cor pulmonale: Secondary | ICD-10-CM | POA: Diagnosis not present

## 2022-07-30 DIAGNOSIS — E785 Hyperlipidemia, unspecified: Secondary | ICD-10-CM | POA: Diagnosis present

## 2022-07-30 DIAGNOSIS — Z8711 Personal history of peptic ulcer disease: Secondary | ICD-10-CM | POA: Diagnosis not present

## 2022-07-30 DIAGNOSIS — Z8249 Family history of ischemic heart disease and other diseases of the circulatory system: Secondary | ICD-10-CM | POA: Diagnosis not present

## 2022-07-30 DIAGNOSIS — Z8051 Family history of malignant neoplasm of kidney: Secondary | ICD-10-CM | POA: Diagnosis not present

## 2022-07-30 DIAGNOSIS — Z7902 Long term (current) use of antithrombotics/antiplatelets: Secondary | ICD-10-CM | POA: Diagnosis not present

## 2022-07-30 DIAGNOSIS — Z87891 Personal history of nicotine dependence: Secondary | ICD-10-CM | POA: Diagnosis not present

## 2022-07-30 DIAGNOSIS — N400 Enlarged prostate without lower urinary tract symptoms: Secondary | ICD-10-CM | POA: Diagnosis present

## 2022-07-30 DIAGNOSIS — Z961 Presence of intraocular lens: Secondary | ICD-10-CM | POA: Diagnosis present

## 2022-07-30 DIAGNOSIS — R296 Repeated falls: Secondary | ICD-10-CM | POA: Diagnosis present

## 2022-07-30 DIAGNOSIS — C679 Malignant neoplasm of bladder, unspecified: Secondary | ICD-10-CM | POA: Diagnosis not present

## 2022-07-30 DIAGNOSIS — A0472 Enterocolitis due to Clostridium difficile, not specified as recurrent: Secondary | ICD-10-CM | POA: Diagnosis not present

## 2022-07-30 DIAGNOSIS — I110 Hypertensive heart disease with heart failure: Secondary | ICD-10-CM | POA: Diagnosis present

## 2022-07-30 DIAGNOSIS — Z8551 Personal history of malignant neoplasm of bladder: Secondary | ICD-10-CM | POA: Diagnosis not present

## 2022-07-30 DIAGNOSIS — I2693 Single subsegmental pulmonary embolism without acute cor pulmonale: Secondary | ICD-10-CM | POA: Diagnosis present

## 2022-07-30 DIAGNOSIS — I5032 Chronic diastolic (congestive) heart failure: Secondary | ICD-10-CM | POA: Diagnosis present

## 2022-07-30 DIAGNOSIS — F0390 Unspecified dementia without behavioral disturbance: Secondary | ICD-10-CM | POA: Diagnosis present

## 2022-07-30 DIAGNOSIS — Z8616 Personal history of COVID-19: Secondary | ICD-10-CM | POA: Diagnosis not present

## 2022-07-30 DIAGNOSIS — Z8673 Personal history of transient ischemic attack (TIA), and cerebral infarction without residual deficits: Secondary | ICD-10-CM | POA: Diagnosis not present

## 2022-07-30 DIAGNOSIS — I739 Peripheral vascular disease, unspecified: Secondary | ICD-10-CM | POA: Diagnosis present

## 2022-07-30 DIAGNOSIS — H9190 Unspecified hearing loss, unspecified ear: Secondary | ICD-10-CM | POA: Diagnosis present

## 2022-07-30 DIAGNOSIS — Z9181 History of falling: Secondary | ICD-10-CM | POA: Diagnosis not present

## 2022-07-30 DIAGNOSIS — A0471 Enterocolitis due to Clostridium difficile, recurrent: Secondary | ICD-10-CM | POA: Diagnosis present

## 2022-07-30 DIAGNOSIS — Z8701 Personal history of pneumonia (recurrent): Secondary | ICD-10-CM | POA: Diagnosis not present

## 2022-07-30 DIAGNOSIS — D630 Anemia in neoplastic disease: Secondary | ICD-10-CM | POA: Diagnosis present

## 2022-07-30 DIAGNOSIS — Z66 Do not resuscitate: Secondary | ICD-10-CM | POA: Diagnosis present

## 2022-07-30 LAB — CBC WITH DIFFERENTIAL/PLATELET
Abs Immature Granulocytes: 0.01 10*3/uL (ref 0.00–0.07)
Basophils Absolute: 0 10*3/uL (ref 0.0–0.1)
Basophils Relative: 1 %
Eosinophils Absolute: 0.1 10*3/uL (ref 0.0–0.5)
Eosinophils Relative: 1 %
HCT: 35.7 % — ABNORMAL LOW (ref 39.0–52.0)
Hemoglobin: 11.4 g/dL — ABNORMAL LOW (ref 13.0–17.0)
Immature Granulocytes: 0 %
Lymphocytes Relative: 18 %
Lymphs Abs: 0.9 10*3/uL (ref 0.7–4.0)
MCH: 28.1 pg (ref 26.0–34.0)
MCHC: 31.9 g/dL (ref 30.0–36.0)
MCV: 88.1 fL (ref 80.0–100.0)
Monocytes Absolute: 0.5 10*3/uL (ref 0.1–1.0)
Monocytes Relative: 10 %
Neutro Abs: 3.4 10*3/uL (ref 1.7–7.7)
Neutrophils Relative %: 70 %
Platelets: 307 10*3/uL (ref 150–400)
RBC: 4.05 MIL/uL — ABNORMAL LOW (ref 4.22–5.81)
RDW: 14.7 % (ref 11.5–15.5)
WBC: 5 10*3/uL (ref 4.0–10.5)
nRBC: 0 % (ref 0.0–0.2)

## 2022-07-30 LAB — HEPATIC FUNCTION PANEL
ALT: 16 U/L (ref 0–44)
AST: 17 U/L (ref 15–41)
Albumin: 3.3 g/dL — ABNORMAL LOW (ref 3.5–5.0)
Alkaline Phosphatase: 60 U/L (ref 38–126)
Bilirubin, Direct: <0.1 mg/dL (ref 0.0–0.2)
Total Bilirubin: 0.6 mg/dL (ref 0.3–1.2)
Total Protein: 5.9 g/dL — ABNORMAL LOW (ref 6.5–8.1)

## 2022-07-30 LAB — ECHOCARDIOGRAM COMPLETE
Area-P 1/2: 2.13 cm2
Height: 68 in
MV VTI: 1.74 cm2
S' Lateral: 2.1 cm
Weight: 2321 oz

## 2022-07-30 LAB — BASIC METABOLIC PANEL
Anion gap: 7 (ref 5–15)
BUN: 17 mg/dL (ref 8–23)
CO2: 26 mmol/L (ref 22–32)
Calcium: 9 mg/dL (ref 8.9–10.3)
Chloride: 107 mmol/L (ref 98–111)
Creatinine, Ser: 0.97 mg/dL (ref 0.61–1.24)
GFR, Estimated: >60 mL/min (ref >60)
Glucose, Bld: 87 mg/dL (ref 70–99)
Potassium: 4.3 mmol/L (ref 3.5–5.1)
Sodium: 140 mmol/L (ref 135–145)

## 2022-07-30 MED ORDER — DONEPEZIL HCL 5 MG PO TABS
5.0000 mg | ORAL_TABLET | Freq: Every day | ORAL | Status: DC
  Administered 2022-07-30 – 2022-07-31 (×2): 5 mg via ORAL
  Filled 2022-07-30 (×2): qty 1

## 2022-07-30 MED ORDER — PERFLUTREN LIPID MICROSPHERE
1.0000 mL | INTRAVENOUS | Status: AC | PRN
  Administered 2022-07-30: 2.5 mL via INTRAVENOUS

## 2022-07-30 MED ORDER — ISOSORBIDE MONONITRATE ER 60 MG PO TB24
60.0000 mg | ORAL_TABLET | Freq: Every morning | ORAL | Status: DC
  Administered 2022-07-30 – 2022-07-31 (×2): 60 mg via ORAL
  Filled 2022-07-30 (×2): qty 1

## 2022-07-30 MED ORDER — ESCITALOPRAM OXALATE 10 MG PO TABS
20.0000 mg | ORAL_TABLET | Freq: Every day | ORAL | Status: DC
  Administered 2022-07-30 – 2022-07-31 (×2): 20 mg via ORAL
  Filled 2022-07-30 (×2): qty 2

## 2022-07-30 MED ORDER — CLOPIDOGREL BISULFATE 75 MG PO TABS
75.0000 mg | ORAL_TABLET | Freq: Every day | ORAL | Status: DC
Start: 2022-07-30 — End: 2022-07-31
  Administered 2022-07-30 – 2022-07-31 (×2): 75 mg via ORAL
  Filled 2022-07-30 (×2): qty 1

## 2022-07-30 MED ORDER — EZETIMIBE 10 MG PO TABS
10.0000 mg | ORAL_TABLET | Freq: Every day | ORAL | Status: DC
  Administered 2022-07-30 – 2022-07-31 (×2): 10 mg via ORAL
  Filled 2022-07-30 (×2): qty 1

## 2022-07-30 MED ORDER — VANCOMYCIN HCL 125 MG PO CAPS
125.0000 mg | ORAL_CAPSULE | ORAL | Status: DC
  Administered 2022-07-31: 125 mg via ORAL
  Filled 2022-07-30: qty 1

## 2022-07-30 MED ORDER — ENOXAPARIN SODIUM 80 MG/0.8ML IJ SOSY
65.0000 mg | PREFILLED_SYRINGE | Freq: Two times a day (BID) | INTRAMUSCULAR | Status: DC
  Administered 2022-07-30 – 2022-07-31 (×3): 65 mg via SUBCUTANEOUS
  Filled 2022-07-30 (×3): qty 0.8

## 2022-07-30 MED ORDER — QUETIAPINE FUMARATE 25 MG PO TABS
25.0000 mg | ORAL_TABLET | Freq: Every evening | ORAL | Status: DC | PRN
  Administered 2022-07-30: 25 mg via ORAL
  Filled 2022-07-30: qty 1

## 2022-07-30 NOTE — Progress Notes (Signed)
PROGRESS NOTE  Paul [name] NWG:[id number] DOB: August 31, 1930 DOA: 07/29/2022 PCP: Crist Infante, MD  HPI/Recap of past 24 hours: Paul [name] is a 88 y.o. male with history of CVA on Plavix, recently admitted for C. difficile, bacteremia with history of CHF hypertension was brought to the ER patient was having chest pain pleuritic in nature for the last 3 to 4 days. Still on vancomycin for C. difficile. In the ER patient underwent CT angiogram of the chest which shows pulm embolism with strain pattern.  Troponins and BNP were negative.  On-call pulmonologist was consulted.  Patient was admitted for further management.  Patient was started on heparin and subsequently transitioned to Lovenox with plans to transition to [place] upon discharge.     Today, patient still reports some pleuritic chest pain, generalized weakness.  Discussed extensively with daughter at bedside, patient lives alone, reported multiple falls.  Diarrhea improving, now with soft stools.  Denies any worsening shortness of breath, abdominal pain, fever/chills.    Assessment/Plan: Principal Problem:   Acute pulmonary embolism (HCC) Active Problems:   C. difficile diarrhea   PAD (peripheral artery disease) (HCC)   Transitional cell carcinoma of bladder (HCC)   Pulmonary embolism (HCC)   Acute pulmonary embolism Currently hemodynamically stable CTA chest showed above, with right heart strain Echo with EF 60 to 65%, no regional wall motion abnormality, grade 1 diastolic dysfunction LE Doppler pending Pulmonology consulted due to right heart strain, appreciate recs Continue Lovenox, transition to [place] upon discharge  Recurrent C. Difficile Diarrhea improving Continue p.o. vancomycin as discussed with pharmacy  History of CVA Continue Plavix, Zetia  Hypertension Continue Imdur  Dementia Continue Aricept, Seroquel as needed if agitated/sundowning  History of frequent falls Daughter reports frequent falls,  lives alone PT/OT Fall precautions    Estimated body mass index is 22.06 kg/m as calculated from the following:   Height as of this encounter: '5\' 8"'$  (1.727 m).   Weight as of this encounter: 65.8 kg.     Code Status: DNR  Family Communication: Discussed extensively with daughter at bedside on 07/30/2022  Disposition Plan: Status is: Inpatient Remains inpatient appropriate because: Level of care      Consultants: Pulmonology  Procedures: None  Antimicrobials: None  DVT prophylaxis: Lovenox   Objective: Vitals:   07/30/22 0450 07/30/22 0608 07/30/22 0804 07/30/22 1200  BP: (!) 161/72 (!) 172/74 (!) 154/65 126/64  Pulse: (!) 58 61 63   Resp: '16 16 19   '$ Temp: 98 F (36.7 C)  98 F (36.7 C) 97.6 F (36.4 C)  TempSrc: Oral  Oral Oral  SpO2: 96% 96% 96%   Weight:      Height:        Intake/Output Summary (Last 24 hours) at 07/30/2022 1402 Last data filed at 07/30/2022 0400 Gross per 24 hour  Intake 235.59 ml  Output --  Net 235.59 ml   Filed Weights   07/29/22 1429 07/30/22 0145  Weight: 62.6 kg 65.8 kg    Exam: General: NAD  Cardiovascular: S1, S2 present Respiratory: CTAB Abdomen: Soft, nontender, nondistended, bowel sounds present Musculoskeletal: No bilateral pedal edema noted Skin: Normal Psychiatry: Normal mood     Data Reviewed: CBC: Recent Labs  Lab 07/29/22 1436 07/30/22 0629  WBC 6.7 5.0  NEUTROABS  --  3.4  HGB 12.0* 11.4*  HCT 37.4* 35.7*  MCV 88.2 88.1  PLT 327 578   Basic Metabolic Panel: Recent Labs  Lab 07/29/22 1436 07/30/22 0629  NA 138 140  K 4.2 4.3  CL 103 107  CO2 28 26  GLUCOSE 112* 87  BUN 21 17  CREATININE 1.01 0.97  CALCIUM 9.1 9.0   GFR: Estimated Creatinine Clearance: 47.1 mL/min (by C-G formula based on SCr of 0.97 mg/dL). Liver Function Tests: Recent Labs  Lab 07/30/22 0629  AST 17  ALT 16  ALKPHOS 60  BILITOT 0.6  PROT 5.9*  ALBUMIN 3.3*   No results for input(s): "LIPASE", "AMYLASE"  in the last 168 hours. No results for input(s): "AMMONIA" in the last 168 hours. Coagulation Profile: Recent Labs  Lab 07/29/22 1857  INR 1.1   Cardiac Enzymes: No results for input(s): "CKTOTAL", "CKMB", "CKMBINDEX", "TROPONINI" in the last 168 hours. BNP (last 3 results) No results for input(s): "PROBNP" in the last 8760 hours. HbA1C: No results for input(s): "HGBA1C" in the last 72 hours. CBG: No results for input(s): "GLUCAP" in the last 168 hours. Lipid Profile: No results for input(s): "CHOL", "HDL", "LDLCALC", "TRIG", "CHOLHDL", "LDLDIRECT" in the last 72 hours. Thyroid Function Tests: No results for input(s): "TSH", "T4TOTAL", "FREET4", "T3FREE", "THYROIDAB" in the last 72 hours. Anemia Panel: No results for input(s): "VITAMINB12", "FOLATE", "FERRITIN", "TIBC", "IRON", "RETICCTPCT" in the last 72 hours. Urine analysis:    Component Value Date/Time   COLORURINE YELLOW 06/20/2022 0736   APPEARANCEUR CLEAR 06/20/2022 0736   LABSPEC >1.046 (H) 06/20/2022 0736   PHURINE 5.0 06/20/2022 0736   GLUCOSEU NEGATIVE 06/20/2022 0736   HGBUR SMALL (A) 06/20/2022 0736   BILIRUBINUR NEGATIVE 06/20/2022 0736   KETONESUR NEGATIVE 06/20/2022 0736   PROTEINUR NEGATIVE 06/20/2022 0736   NITRITE NEGATIVE 06/20/2022 0736   LEUKOCYTESUR NEGATIVE 06/20/2022 0736   Sepsis Labs: '@LABRCNTIP'$ (procalcitonin:4,lacticidven:4)  )No results found for this or any previous visit (from the past 240 hour(s)).    Studies: ECHOCARDIOGRAM COMPLETE  Result Date: 07/30/2022    ECHOCARDIOGRAM REPORT   Patient Name:   Paul [name] Date of Exam: 07/30/2022 Medical Rec #:  [id number]       Height:       68.0 in Accession #:    [id number]      Weight:       145.1 lb Date of Birth:  08-Mar-1930       BSA:          1.783 m Patient Age:    77 years        BP:           154/65 mmHg Patient Gender: M               HR:           61 bpm. Exam Location:  Inpatient Procedure: 2D Echo, Cardiac Doppler, Color Doppler and  Intracardiac            Opacification Agent Indications:    Pulmonary Embolus I26.09  History:        Patient has prior history of Echocardiogram examinations, most                 recent 01/31/2022. Risk Factors:Hypertension and Dyslipidemia.  Sonographer:    Eartha Inch Referring Phys: [id number] Patience  Sonographer Comments: Technically difficult study due to poor echo windows, suboptimal parasternal window, suboptimal apical window and suboptimal subcostal window. Image acquisition challenging due to patient body habitus and Image acquisition challenging due to respiratory motion. IMPRESSIONS  1. Left ventricular ejection fraction, by estimation, is 60 to 65%. The left ventricle has normal function.  The left ventricle has no regional wall motion abnormalities. There is moderate concentric left ventricular hypertrophy. Left ventricular diastolic parameters are consistent with Grade I diastolic dysfunction (impaired relaxation).  2. Right ventricular systolic function is normal. The right ventricular size is normal.  3. The mitral valve is normal in structure. No evidence of mitral valve regurgitation. No evidence of mitral stenosis.  4. The aortic valve is normal in structure. Aortic valve regurgitation is not visualized. No aortic stenosis is present.  5. The inferior vena cava is normal in size with greater than 50% respiratory variability, suggesting right atrial pressure of 3 mmHg. FINDINGS  Left Ventricle: Left ventricular ejection fraction, by estimation, is 60 to 65%. The left ventricle has normal function. The left ventricle has no regional wall motion abnormalities. Definity contrast agent was given IV to delineate the left ventricular  endocardial borders. The left ventricular internal cavity size was normal in size. There is moderate concentric left ventricular hypertrophy. Left ventricular diastolic parameters are consistent with Grade I diastolic dysfunction (impaired relaxation). Right  Ventricle: The right ventricular size is normal. No increase in right ventricular wall thickness. Right ventricular systolic function is normal. Left Atrium: Left atrial size was normal in size. Right Atrium: Right atrial size was normal in size. Pericardium: There is no evidence of pericardial effusion. Presence of epicardial fat layer. Mitral Valve: The mitral valve is normal in structure. No evidence of mitral valve regurgitation. No evidence of mitral valve stenosis. MV peak gradient, 3.7 mmHg. The mean mitral valve gradient is 1.0 mmHg. Tricuspid Valve: The tricuspid valve is normal in structure. Tricuspid valve regurgitation is mild . No evidence of tricuspid stenosis. Aortic Valve: The aortic valve is normal in structure. Aortic valve regurgitation is not visualized. No aortic stenosis is present. Pulmonic Valve: The pulmonic valve was not well visualized. Pulmonic valve regurgitation is trivial. No evidence of pulmonic stenosis. Aorta: The aortic root is normal in size and structure. Venous: The inferior vena cava is normal in size with greater than 50% respiratory variability, suggesting right atrial pressure of 3 mmHg. IAS/Shunts: No atrial level shunt detected by color flow Doppler.  LEFT VENTRICLE PLAX 2D LVIDd:         3.30 cm   Diastology LVIDs:         2.10 cm   LV e' medial:    5.33 cm/s LV PW:         1.60 cm   LV E/e' medial:  12.2 LV IVS:        1.50 cm   LV e' lateral:   6.68 cm/s LVOT diam:     1.70 cm   LV E/e' lateral: 9.7 LV SV:         53 LV SV Index:   30 LVOT Area:     2.27 cm  RIGHT VENTRICLE RV S prime:     14.10 cm/s TAPSE (M-mode): 2.2 cm LEFT ATRIUM           Index        RIGHT ATRIUM           Index LA diam:      3.00 cm 1.68 cm/m   RA Area:     11.70 cm LA Vol (A2C): 17.5 ml 9.81 ml/m   RA Volume:   27.40 ml  15.37 ml/m LA Vol (A4C): 27.4 ml 15.37 ml/m  AORTIC VALVE             PULMONIC VALVE LVOT Vmax:  116.00 cm/s PR End Diast Vel: 2.72 msec LVOT Vmean:  70.300 cm/s LVOT  VTI:    0.232 m  AORTA Ao Root diam: 3.40 cm Ao Asc diam:  3.60 cm MITRAL VALVE               TRICUSPID VALVE MV Area (PHT): 2.13 cm    TR Peak grad:   25.6 mmHg MV Area VTI:   1.74 cm    TR Vmax:        253.00 cm/s MV Peak grad:  3.7 mmHg MV Mean grad:  1.0 mmHg    SHUNTS MV Vmax:       0.96 m/s    Systemic VTI:  0.23 m MV Vmean:      52.1 cm/s   Systemic Diam: 1.70 cm MV Decel Time: 356 msec MV E velocity: 65.10 cm/s MV A velocity: 95.10 cm/s MV E/A ratio:  0.68 Kardie Tobb DO Electronically signed by Berniece Salines DO Signature Date/Time: 07/30/2022/12:03:32 PM    Final    CT Angio Chest PE W and/or Wo Contrast  Result Date: 07/29/2022 CLINICAL DATA:  Positive D-dimer.  Chest pain. EXAM: CT ANGIOGRAPHY CHEST WITH CONTRAST TECHNIQUE: Multidetector CT imaging of the chest was performed using the standard protocol during bolus administration of intravenous contrast. Multiplanar CT image reconstructions and MIPs were obtained to evaluate the vascular anatomy. RADIATION DOSE REDUCTION: This exam was performed according to the departmental dose-optimization program which includes automated exposure control, adjustment of the mA and/or kV according to patient size and/or use of iterative reconstruction technique. CONTRAST:  61m OMNIPAQUE IOHEXOL 350 MG/ML SOLN COMPARISON:  CT chest abdomen and pelvis 06/20/2022 FINDINGS: Cardiovascular: There are findings concerning for subsegmental pulmonary emboli in the left lower lobe in the region of focal lung opacity. This is new from prior examination. No other pulmonary emboli are seen. Aorta is ectatic without focal dilatation. Heart size is within normal limits. There is no pericardial effusion. There are atherosclerotic calcifications of the aorta. Mediastinum/Nodes: No enlarged mediastinal, hilar, or axillary lymph nodes. Thyroid gland, trachea, and esophagus demonstrate no significant findings. Lungs/Pleura: Mild emphysematous changes are again seen. Small to  moderate-sized left pleural effusion with diffuse pleural thickening appears similar to the prior examination. Adjacent oval parenchymal density in the left lower lobe measures 2.3 x 6.7 cm it also appears unchanged. No air bronchograms seen in this region. No new focal lung infiltrate or pneumothorax. Trachea and central airways are patent. Upper Abdomen: No acute abnormality. Musculoskeletal: No chest wall abnormality. No acute osseous findings. Degenerative changes affect the spine. Review of the MIP images confirms the above findings. IMPRESSION: 1. Subsegmental left lower lobe pulmonary emboli in the region of focal lung opacity, new from prior. 2. Positive for acute PE with CTevidence of right heart strain (RV/LV Ratio = 1.3) consistent with at least submassive (intermediate risk) PE. The presence of right heart strain has been associated with an increased risk of morbidity and mortality. 3. Stable small to moderate-sized left pleural effusion with diffuse pleural thickening. Findings may be related to empyema. 4. Stable left lower lobe consolidation, possibly rounded atelectasis. Underlying mass not excluded. Aortic Atherosclerosis (ICD10-I70.0) and Emphysema (ICD10-J43.9). Electronically Signed   By: ARonney AstersM.D.   On: 07/29/2022 18:11   DG Chest Port 1 View  Result Date: 07/29/2022 CLINICAL DATA:  Chest pain. EXAM: PORTABLE CHEST 1 VIEW COMPARISON:  06/20/2022 FINDINGS: Heart size and mediastinal contours are stable. Aortic atherosclerotic calcifications. Unchanged appearance of moderate loculated  left pleural effusion with overlying atelectasis/consolidation. No new findings identified. Visualized osseous structures appear intact. IMPRESSION: 1. Stable loculated left pleural effusion with overlying atelectasis/consolidation. 2. No new findings. Electronically Signed   By: Kerby Moors M.D.   On: 07/29/2022 15:14    Scheduled Meds:  clopidogrel  75 mg Oral Daily   donepezil  5 mg Oral Daily    enoxaparin (LOVENOX) injection  65 mg Subcutaneous Q12H   escitalopram  20 mg Oral Daily   ezetimibe  10 mg Oral Daily   isosorbide mononitrate  60 mg Oral q morning   [START ON 07/31/2022] vancomycin  125 mg Oral QODAY    Continuous Infusions:   LOS: 0 days     Alma Friendly, MD [facility]  If 7PM-7AM, please contact night-coverage [email or web address] 07/30/2022, 2:02 PM

## 2022-07-30 NOTE — H&P (Signed)
History and Physical    Paul [name] JJK:[id number] DOB: 1930-04-30 DOA: 07/29/2022  PCP: Crist Infante, MD  Patient coming from: Home.  Chief Complaint: Chest pain.  HPI: Paul [name] is a 88 y.o. male with history of CVA on Plavix, recently admitted for C. difficile, bacteremia with history of CHF hypertension was brought to the ER patient was having chest pain pleuritic in nature for the last 3 to 4 days.  Denies any productive cough fever or chills.  Still has diarrhea and was on vancomycin for C. difficile.  ED Course: In the ER patient underwent CT angiogram of the chest which shows pulm embolism with strain pattern.  Troponins and BNP were negative.  On-call pulmonologist was consulted.  Patient was started on heparin.  Review of Systems: As per HPI, rest all negative.   Past Medical History:  Diagnosis Date   Back pain    Bladder tumor    BPH (benign prostatic hyperplasia)    Clostridioides difficile infection 06/10/2022   Hyperlipidemia    Hypertension    Impaired hearing BILATERAL HEARING AIDS   Personal history of gastric ulcer    Pneumonia 06/10/2022    Past Surgical History:  Procedure Laterality Date   APPENDECTOMY  1957   CATARACT EXTRACTION W/ INTRAOCULAR LENS  IMPLANT, BILATERAL     CYSTOSCOPY WITH BIOPSY  03/08/2012   Procedure: CYSTOSCOPY WITH BIOPSY;  Surgeon: Claybon Jabs, MD;  Location: [facility];  Service: Urology;  Laterality: N/A;  gyrus   TONSILLECTOMY  CHILD   TRANSURETHRAL INCISION OF PROSTATE  03/08/2012   Procedure: TRANSURETHRAL INCISION OF THE PROSTATE (TUIP);  Surgeon: Claybon Jabs, MD;  Location: [facility];  Service: Urology;  Laterality: N/A;   TRANSURETHRAL RESECTION OF BLADDER TUMOR  01/26/2012   Procedure: TRANSURETHRAL RESECTION OF BLADDER TUMOR (TURBT);  Surgeon: Claybon Jabs, MD;  Location: [facility];  Service: Urology;  Laterality: N/A;  GYRUS MYTOMICIN C      reports that  he quit smoking about 41 years ago. His smoking use included cigarettes. He has never used smokeless tobacco. He reports that he does not drink alcohol and does not use drugs.  Allergies  Allergen Reactions   Livalo [Pitavastatin] Other (See Comments)    Cramping and back pain   Haldol [Haloperidol] Other (See Comments)    Family does not like his affect when he is given this - makes him a "zombie."   Other Diarrhea and Other (See Comments)    Unnamed patch and tablets for dementia caused diarrhea (Possibly galantamine, from outside source)     Family History  Problem Relation Age of Onset   Kidney cancer Mother    Hypertension Father     Prior to Admission medications   Medication Sig Start Date End Date Taking? Authorizing Provider  acetaminophen (TYLENOL) 500 MG tablet Take 1,000 mg by mouth daily as needed for mild pain or headache.    [provider]  clopidogrel (PLAVIX) 75 MG tablet Take 75 mg by mouth daily. 01/27/20   [provider]  donepezil (ARICEPT) 5 MG tablet Take 5 mg by mouth daily.    [provider]  escitalopram (LEXAPRO) 20 MG tablet Take 20 mg by mouth daily. 01/24/20   [provider]  ezetimibe (ZETIA) 10 MG tablet Take 10 mg by mouth daily.    [provider]  fidaxomicin (DIFICID) 200 MG TABS tablet Take 1 tablet (200 mg total) by  mouth 2 (two) times daily. 06/24/22   Dwyane Dee, MD  isosorbide mononitrate (IMDUR) 60 MG 24 hr tablet Take 1 tablet (60 mg total) by mouth every morning. do not give if systolic blood pressure < 130 Patient taking differently: Take 60 mg by mouth every morning. 02/01/22   Patrecia Pour, MD  simvastatin (ZOCOR) 20 MG tablet Take 20 mg by mouth every evening.  02/27/12  [provider]    Physical Exam: Constitutional: Moderately built and nourished. Vitals:   07/29/22 2330 07/30/22 0000 07/30/22 0030 07/30/22 0145  BP: (!) 150/56 (!) 150/58 (!) 155/65   Pulse: (!) 59 (!) 57  (!) 56 (!) 57  Resp: '18 17 14 16  '$ Temp:  98 F (36.7 C)  98.3 F (36.8 C)  TempSrc:  Oral  Oral  SpO2: 95% 95% 95% 97%  Weight:    65.8 kg  Height:    '5\' 8"'$  (1.727 m)   Eyes: Anicteric no pallor. ENMT: No discharge from the ears eyes nose and mouth. Neck: No mass felt.  No neck rigidity. Respiratory: No rhonchi or crepitations. Cardiovascular: S1-S2 heard. Abdomen: Soft nontender bowel sound present. Musculoskeletal: Edema of the left ankle. Skin: No rash. Neurologic: Alert awake oriented to time place and person.  Moves all extremities. Psychiatric: Has history of dementia.   Labs on Admission: I have personally reviewed following labs and imaging studies  CBC: Recent Labs  Lab 07/29/22 1436  WBC 6.7  HGB 12.0*  HCT 37.4*  MCV 88.2  PLT 510   Basic Metabolic Panel: Recent Labs  Lab 07/29/22 1436  NA 138  K 4.2  CL 103  CO2 28  GLUCOSE 112*  BUN 21  CREATININE 1.01  CALCIUM 9.1   GFR: Estimated Creatinine Clearance: 45.2 mL/min (by C-G formula based on SCr of 1.01 mg/dL). Liver Function Tests: No results for input(s): "AST", "ALT", "ALKPHOS", "BILITOT", "PROT", "ALBUMIN" in the last 168 hours. No results for input(s): "LIPASE", "AMYLASE" in the last 168 hours. No results for input(s): "AMMONIA" in the last 168 hours. Coagulation Profile: Recent Labs  Lab 07/29/22 1857  INR 1.1   Cardiac Enzymes: No results for input(s): "CKTOTAL", "CKMB", "CKMBINDEX", "TROPONINI" in the last 168 hours. BNP (last 3 results) No results for input(s): "PROBNP" in the last 8760 hours. HbA1C: No results for input(s): "HGBA1C" in the last 72 hours. CBG: No results for input(s): "GLUCAP" in the last 168 hours. Lipid Profile: No results for input(s): "CHOL", "HDL", "LDLCALC", "TRIG", "CHOLHDL", "LDLDIRECT" in the last 72 hours. Thyroid Function Tests: No results for input(s): "TSH", "T4TOTAL", "FREET4", "T3FREE", "THYROIDAB" in the last 72 hours. Anemia Panel: No results  for input(s): "VITAMINB12", "FOLATE", "FERRITIN", "TIBC", "IRON", "RETICCTPCT" in the last 72 hours. Urine analysis:    Component Value Date/Time   COLORURINE YELLOW 06/20/2022 0736   APPEARANCEUR CLEAR 06/20/2022 0736   LABSPEC >1.046 (H) 06/20/2022 0736   PHURINE 5.0 06/20/2022 0736   GLUCOSEU NEGATIVE 06/20/2022 0736   HGBUR SMALL (A) 06/20/2022 0736   BILIRUBINUR NEGATIVE 06/20/2022 0736   KETONESUR NEGATIVE 06/20/2022 0736   PROTEINUR NEGATIVE 06/20/2022 0736   NITRITE NEGATIVE 06/20/2022 0736   LEUKOCYTESUR NEGATIVE 06/20/2022 0736   Sepsis Labs: '@LABRCNTIP'$ (procalcitonin:4,lacticidven:4) )No results found for this or any previous visit (from the past 240 hour(s)).   Radiological Exams on Admission: CT Angio Chest PE W and/or Wo Contrast  Result Date: 07/29/2022 CLINICAL DATA:  Positive D-dimer.  Chest pain. EXAM: CT ANGIOGRAPHY CHEST WITH CONTRAST TECHNIQUE: Multidetector  CT imaging of the chest was performed using the standard protocol during bolus administration of intravenous contrast. Multiplanar CT image reconstructions and MIPs were obtained to evaluate the vascular anatomy. RADIATION DOSE REDUCTION: This exam was performed according to the departmental dose-optimization program which includes automated exposure control, adjustment of the mA and/or kV according to patient size and/or use of iterative reconstruction technique. CONTRAST:  1m OMNIPAQUE IOHEXOL 350 MG/ML SOLN COMPARISON:  CT chest abdomen and pelvis 06/20/2022 FINDINGS: Cardiovascular: There are findings concerning for subsegmental pulmonary emboli in the left lower lobe in the region of focal lung opacity. This is new from prior examination. No other pulmonary emboli are seen. Aorta is ectatic without focal dilatation. Heart size is within normal limits. There is no pericardial effusion. There are atherosclerotic calcifications of the aorta. Mediastinum/Nodes: No enlarged mediastinal, hilar, or axillary lymph nodes.  Thyroid gland, trachea, and esophagus demonstrate no significant findings. Lungs/Pleura: Mild emphysematous changes are again seen. Small to moderate-sized left pleural effusion with diffuse pleural thickening appears similar to the prior examination. Adjacent oval parenchymal density in the left lower lobe measures 2.3 x 6.7 cm it also appears unchanged. No air bronchograms seen in this region. No new focal lung infiltrate or pneumothorax. Trachea and central airways are patent. Upper Abdomen: No acute abnormality. Musculoskeletal: No chest wall abnormality. No acute osseous findings. Degenerative changes affect the spine. Review of the MIP images confirms the above findings. IMPRESSION: 1. Subsegmental left lower lobe pulmonary emboli in the region of focal lung opacity, new from prior. 2. Positive for acute PE with CTevidence of right heart strain (RV/LV Ratio = 1.3) consistent with at least submassive (intermediate risk) PE. The presence of right heart strain has been associated with an increased risk of morbidity and mortality. 3. Stable small to moderate-sized left pleural effusion with diffuse pleural thickening. Findings may be related to empyema. 4. Stable left lower lobe consolidation, possibly rounded atelectasis. Underlying mass not excluded. Aortic Atherosclerosis (ICD10-I70.0) and Emphysema (ICD10-J43.9). Electronically Signed   By: ARonney AstersM.D.   On: 07/29/2022 18:11   DG Chest Port 1 View  Result Date: 07/29/2022 CLINICAL DATA:  Chest pain. EXAM: PORTABLE CHEST 1 VIEW COMPARISON:  06/20/2022 FINDINGS: Heart size and mediastinal contours are stable. Aortic atherosclerotic calcifications. Unchanged appearance of moderate loculated left pleural effusion with overlying atelectasis/consolidation. No new findings identified. Visualized osseous structures appear intact. IMPRESSION: 1. Stable loculated left pleural effusion with overlying atelectasis/consolidation. 2. No new findings.  Electronically Signed   By: TKerby MoorsM.D.   On: 07/29/2022 15:14    EKG: Independently reviewed.  Normal sinus rhythm.  Assessment/Plan Principal Problem:   Acute pulmonary embolism (HCC) Active Problems:   C. difficile diarrhea   PAD (peripheral artery disease) (HCC)   Transitional cell carcinoma of bladder (HCC)   Pulmonary embolism (HCC)    Acute pulmonary embolism likely unprovoked presently hemodynamically stable.  Appreciate pulmonary consult.  Plan is to transition to Lovenox and subsequently to oral anticoagulants.  Check 2D echo and Dopplers of the lower extremity. Recurrent C. difficile has diarrhea.  On vancomycin oral dose.  Per daughter patient's dose is to be on vancomycin p.o. 125 mg alternate days from now until the end of August.  Discussed with pharmacy. If she has stroke on Plavix and Zetia. Hypertension on Imdur. Anemia follow CBC.  Appears to be chronic. Recent admission for sepsis. History of transitional cell carcinoma of the bladder.   DVT prophylaxis: Lovenox full dose. Code Status: DNR.  Family Communication: Daughter. Disposition Plan: Home. Consults called: Pulmonologist. Admission status: Observation.   Rise Patience MD [facility] Pager [phone].  If 7PM-7AM, please contact night-coverage [email or web address] Password [facility]  07/30/2022, 3:55 AM

## 2022-07-30 NOTE — Progress Notes (Signed)
ANTICOAGULATION CONSULT NOTE - Initial Consult  Pharmacy Consult for Lovenox  Indication: pulmonary embolus  Allergies  Allergen Reactions   Livalo [Pitavastatin] Other (See Comments)    Cramping and back pain   Haldol [Haloperidol] Other (See Comments)    Family does not like his affect when he is given this - makes him a "zombie."   Other Diarrhea and Other (See Comments)    Unnamed patch and tablets for dementia caused diarrhea (Possibly galantamine, from outside source)     Patient Measurements: Height: '5\' 8"'$  (172.7 cm) Weight: 65.8 kg (145 lb 1 oz) IBW/kg (Calculated) : 68.4  Vital Signs: Temp: 98 F (36.7 C) (08/16 0450) Temp Source: Oral (08/16 0450) BP: 161/72 (08/16 0450) Pulse Rate: 58 (08/16 0450)  Labs: Recent Labs    07/29/22 1436 07/29/22 1620 07/29/22 1857  HGB 12.0*  --   --   HCT 37.4*  --   --   PLT 327  --   --   APTT  --   --  29  LABPROT  --   --  14.4  INR  --   --  1.1  CREATININE 1.01  --   --   TROPONINIHS 4 4  --     Estimated Creatinine Clearance: 45.2 mL/min (by C-G formula based on SCr of 1.01 mg/dL).   Medical History: Past Medical History:  Diagnosis Date   Back pain    Bladder tumor    BPH (benign prostatic hyperplasia)    Clostridioides difficile infection 06/10/2022   Hyperlipidemia    Hypertension    Impaired hearing BILATERAL HEARING AIDS   Personal history of gastric ulcer    Pneumonia 06/10/2022     Assessment: 88 y/o M with PE on heparin drip, changing to Lovenox, labs above reviewed.   Goal of Therapy:  Monitor platelets by anticoagulation protocol: Yes   Plan:  DC heparin  Start Lovenox 65 mg subcutaneous q12h Daily CBC while inpatient Monitor for bleeding  Narda Bonds, PharmD, BCPS Clinical Pharmacist Phone: [phone]

## 2022-07-30 NOTE — Progress Notes (Signed)
[facility] night cross cover note:   I was notified by RN of the patient's daughter's request, made at bedside, for a dysphagia level 3 diet with thin liquids.  Daughter conveys prior formal swallow study performed via SLP evaluation leading to recommendation for the above diet.   I subsequently placed diet order for dysphagia level 3 with thin liquids, as requested.     Babs Bertin, DO Hospitalist

## 2022-07-30 NOTE — Progress Notes (Signed)
Lower extremity venous bilateral study completed.   Please see CV Proc for preliminary results.   Timeka Goette, RDMS, RVT  

## 2022-07-31 ENCOUNTER — Telehealth (HOSPITAL_COMMUNITY): Payer: Self-pay | Admitting: Pharmacy Technician

## 2022-07-31 ENCOUNTER — Other Ambulatory Visit (HOSPITAL_COMMUNITY): Payer: Self-pay

## 2022-07-31 DIAGNOSIS — I2699 Other pulmonary embolism without acute cor pulmonale: Secondary | ICD-10-CM | POA: Diagnosis not present

## 2022-07-31 DIAGNOSIS — C679 Malignant neoplasm of bladder, unspecified: Secondary | ICD-10-CM

## 2022-07-31 DIAGNOSIS — I739 Peripheral vascular disease, unspecified: Secondary | ICD-10-CM

## 2022-07-31 DIAGNOSIS — A0472 Enterocolitis due to Clostridium difficile, not specified as recurrent: Secondary | ICD-10-CM | POA: Diagnosis not present

## 2022-07-31 LAB — CBC WITH DIFFERENTIAL/PLATELET
Abs Immature Granulocytes: 0.01 10*3/uL (ref 0.00–0.07)
Basophils Absolute: 0 10*3/uL (ref 0.0–0.1)
Basophils Relative: 1 %
Eosinophils Absolute: 0.1 10*3/uL (ref 0.0–0.5)
Eosinophils Relative: 3 %
HCT: 35.1 % — ABNORMAL LOW (ref 39.0–52.0)
Hemoglobin: 11.4 g/dL — ABNORMAL LOW (ref 13.0–17.0)
Immature Granulocytes: 0 %
Lymphocytes Relative: 19 %
Lymphs Abs: 0.9 10*3/uL (ref 0.7–4.0)
MCH: 28.4 pg (ref 26.0–34.0)
MCHC: 32.5 g/dL (ref 30.0–36.0)
MCV: 87.3 fL (ref 80.0–100.0)
Monocytes Absolute: 0.5 10*3/uL (ref 0.1–1.0)
Monocytes Relative: 11 %
Neutro Abs: 3.2 10*3/uL (ref 1.7–7.7)
Neutrophils Relative %: 66 %
Platelets: 299 10*3/uL (ref 150–400)
RBC: 4.02 MIL/uL — ABNORMAL LOW (ref 4.22–5.81)
RDW: 14.6 % (ref 11.5–15.5)
WBC: 4.8 10*3/uL (ref 4.0–10.5)
nRBC: 0 % (ref 0.0–0.2)

## 2022-07-31 LAB — BASIC METABOLIC PANEL
Anion gap: 7 (ref 5–15)
BUN: 15 mg/dL (ref 8–23)
CO2: 26 mmol/L (ref 22–32)
Calcium: 9 mg/dL (ref 8.9–10.3)
Chloride: 108 mmol/L (ref 98–111)
Creatinine, Ser: 1.05 mg/dL (ref 0.61–1.24)
GFR, Estimated: >60 mL/min (ref >60)
Glucose, Bld: 81 mg/dL (ref 70–99)
Potassium: 4.1 mmol/L (ref 3.5–5.1)
Sodium: 141 mmol/L (ref 135–145)

## 2022-07-31 MED ORDER — APIXABAN 5 MG PO TABS: ORAL_TABLET | ORAL | 0 refills | Status: DC

## 2022-07-31 MED ORDER — APIXABAN 5 MG PO TABS: 5.0000 mg | ORAL_TABLET | Freq: Two times a day (BID) | ORAL | Status: DC

## 2022-07-31 MED ORDER — APIXABAN 5 MG PO TABS: 10.0000 mg | ORAL_TABLET | Freq: Two times a day (BID) | ORAL | Status: DC

## 2022-07-31 NOTE — Telephone Encounter (Signed)
Pharmacy Patient Advocate Encounter  Insurance verification completed.    The patient is insured through [employer] Part D   The patient is currently admitted and ran test claims for the following: Eliquis, Xarelto.  Copays and coinsurance results were relayed to Inpatient clinical team.

## 2022-07-31 NOTE — Evaluation (Addendum)
Physical Therapy Evaluation Patient Details Name: Paul [name] MRN: [id number] DOB: [birth year] Today's Date: 07/31/2022  History of Present Illness  88 yo male who presented 07/29/22 due to chest pain for 3-4 days. Ct showed PE with strain patterns.  PMH Dementia, HTN, Cva of L medulla 01/2022, bladder CA, C diff, hypoxic respitory faluire, CHF, back pain, impaired hearing, pneumonia  Clinical Impression  Pt presents to PT at or near his recent baseline. Pt is able to ambulate and transfer without physical assistance. Pt ambulates distances greater than his baseline during today's evaluation according to family. PT encourages frequent mobilization in an effort to maintain function and reduce the risk of future blood clots. PT recommends discharge home when medically ready. Family requesting continued HHPT services.      Recommendations for follow up therapy are one component of a multi-disciplinary discharge planning process, led by the attending physician.  Recommendations may be updated based on patient status, additional functional criteria and insurance authorization.  Follow Up Recommendations Home health PT      Assistance Recommended at Discharge Intermittent Supervision/Assistance  Patient can return home with the following  A little help with walking and/or transfers;A little help with bathing/dressing/bathroom;Help with stairs or ramp for entrance;Assist for transportation;Assistance with cooking/housework;Direct supervision/assist for medications management;Direct supervision/assist for financial management    Equipment Recommendations None recommended by PT  Recommendations for Other Services       Functional Status Assessment Patient has had a recent decline in their functional status and demonstrates the ability to make significant improvements in function in a reasonable and predictable amount of time.     Precautions / Restrictions Precautions Precautions:  Fall Precaution Comments: Cdiff Restrictions Weight Bearing Restrictions: No      Mobility  Bed Mobility                    Transfers Overall transfer level: Needs assistance Equipment used: Rolling walker (2 wheels) Transfers: Sit to/from Stand Sit to Stand: Supervision                Ambulation/Gait Ambulation/Gait assistance: Supervision Gait Distance (Feet): 80 Feet Assistive device: Rolling walker (2 wheels) Gait Pattern/deviations: Step-through pattern, Trunk flexed Gait velocity: reduced Gait velocity interpretation: <1.8 ft/sec, indicate of risk for recurrent falls   General Gait Details: pt with steady step-through gait, verbal cues from PT to maintain RW closer to BOS  Stairs            Wheelchair Mobility    Modified Rankin (Stroke Patients Only)       Balance Overall balance assessment: Needs assistance Sitting-balance support: No upper extremity supported, Feet supported Sitting balance-Leahy Scale: Good     Standing balance support: Single extremity supported, Bilateral upper extremity supported, Reliant on assistive device for balance Standing balance-Leahy Scale: Poor                               Pertinent Vitals/Pain Pain Assessment Pain Assessment: Faces Faces Pain Scale: Hurts a little bit Pain Location: chest Pain Descriptors / Indicators: Tightness Pain Intervention(s): Monitored during session    Home Living Family/patient expects to be discharged to:: Private residence Living Arrangements: Spouse/significant other Available Help at Discharge: Family;Available 24 hours/day Type of Home: House Home Access: Stairs to enter Entrance Stairs-Rails: Left Entrance Stairs-Number of Steps: 2   Home Layout: One level Home Equipment: Conservation officer, nature (2 wheels);Shower seat;Grab bars - tub/shower;BSC/3in1;Wheelchair - manual  Additional Comments: son bumps pt up stairs in wheelchair    Prior Function Prior  Level of Function : Needs assist;History of Falls (last six months)  Cognitive Assist : Mobility (cognitive);ADLs (cognitive) Mobility (Cognitive): Intermittent cues ADLs (Cognitive): Intermittent cues Physical Assist : Mobility (physical);ADLs (physical) Mobility (physical): Bed mobility;Transfers;Gait;Stairs ADLs (physical): Grooming;Bathing;Dressing;Toileting Mobility Comments: pt ambulates household distances with use of walker, assistance from family to bump up steps in wheelchair. Pt utilizes a wheelchair for all community mobility ADLs Comments: Pt has support 24/7 with return to home for physical and cognative assistance     Hand Dominance   Dominant Hand: Right    Extremity/Trunk Assessment   Upper Extremity Assessment Upper Extremity Assessment: Overall WFL for tasks assessed    Lower Extremity Assessment Lower Extremity Assessment: Generalized weakness (likely at or near baseline)    Cervical / Trunk Assessment Cervical / Trunk Assessment: Kyphotic  Communication   Communication: HOH  Cognition Arousal/Alertness: Awake/alert Behavior During Therapy: Agitated, WFL for tasks assessed/performed (intermittent periods of agitation with daughters attempts to correct the patient's history reports) Overall Cognitive Status: Impaired/Different from baseline Area of Impairment: Memory                     Memory: Decreased short-term memory         General Comments: pt providing conflicting home setup and PLOF reports at times compared to his daughter. Pt becomes mildly agitated with daughters attempts to correct the patient        General Comments General comments (skin integrity, edema, etc.): VSS on RA, HR in 60s at rest, 90s with ambulation. SpO2 stable, pt denies dyspnea.    Exercises     Assessment/Plan    PT Assessment Patient does not need any further PT services  PT Problem List         PT Treatment Interventions      PT Goals (Current goals  can be found in the Care Plan section)       Frequency       Co-evaluation               AM-PAC PT "6 Clicks" Mobility  Outcome Measure Help needed turning from your back to your side while in a flat bed without using bedrails?: A Little Help needed moving from lying on your back to sitting on the side of a flat bed without using bedrails?: A Little Help needed moving to and from a bed to a chair (including a wheelchair)?: A Little Help needed standing up from a chair using your arms (e.g., wheelchair or bedside chair)?: A Little Help needed to walk in hospital room?: A Little Help needed climbing 3-5 steps with a railing? : Total 6 Click Score: 16    End of Session   Activity Tolerance: Patient tolerated treatment well Patient left: in chair;with call bell/phone within reach;with chair alarm set;with family/visitor present Nurse Communication: Mobility status      Time: 0174-9449 PT Time Calculation (min) (ACUTE ONLY): 18 min   Charges:   PT Evaluation $PT Eval Low Complexity: [place], PT, DPT Acute Rehabilitation Office [phone]   Zenaida Niece 07/31/2022, 1:47 PM

## 2022-07-31 NOTE — Evaluation (Signed)
Occupational Therapy Evaluation Patient Details Name: Paul [name] MRN: [id number] DOB: 1930-01-29 Today's Date: 07/31/2022   History of Present Illness 88 yo male who presented 07/29/22 due to chest pain for 3-4 days. Ct showed PE with strain patterns.  PMH Dementia, HTN, Cva of L medulla 01/2022, bladder CA, C diff, hypoxic respitory faluire, CHF, back pain, impaired hearing, pneumonia   Clinical Impression   Pt's daughter was in room and reported they are able to provided 24/7 support with the return to home and son can assist with entering/exiting the home with use of WC. Pt then can use WC around the home except the bathroom but has a BSC to use in the home. Pt was able to complete to transfer to [facility] with min guard to min assist with RW but needed assisting guiding walker to each position and max assist for hygiene. Will continue to follow to maximize ADLs with the return to home.       Recommendations for follow up therapy are one component of a multi-disciplinary discharge planning process, led by the attending physician.  Recommendations may be updated based on patient status, additional functional criteria and insurance authorization.   Follow Up Recommendations  Home health OT    Assistance Recommended at Discharge Frequent or constant Supervision/Assistance  Patient can return home with the following A little help with walking and/or transfers;A lot of help with bathing/dressing/bathroom;Assistance with cooking/housework;Assistance with feeding;Direct supervision/assist for medications management;Direct supervision/assist for financial management;Assist for transportation    Functional Status Assessment  Patient has had a recent decline in their functional status and demonstrates the ability to make significant improvements in function in a reasonable and predictable amount of time.  Equipment Recommendations  None recommended by OT    Recommendations for Other Services        Precautions / Restrictions Precautions Precautions: Fall Precaution Comments: Cdiff, urine incontinence  Restrictions Weight Bearing Restrictions: No      Mobility Bed Mobility Overal bed mobility: Needs Assistance Bed Mobility: Supine to Sit     Supine to sit: Supervision          Transfers Overall transfer level: Needs assistance Equipment used: Rolling walker (2 wheels) Transfers: Sit to/from Stand Sit to Stand: Min guard                  Balance Overall balance assessment: Needs assistance Sitting-balance support: No upper extremity supported, Feet supported Sitting balance-Leahy Scale: Fair     Standing balance support: Bilateral upper extremity supported Standing balance-Leahy Scale: Poor                             ADL either performed or assessed with clinical judgement   ADL Overall ADL's : Needs assistance/impaired Eating/Feeding: Set up;Sitting   Grooming: Wash/dry hands;Set up;Sitting   Upper Body Bathing: Min guard;Sitting   Lower Body Bathing: Moderate assistance;Cueing for safety;Cueing for sequencing;Sit to/from stand   Upper Body Dressing : Min guard;Cueing for safety;Cueing for sequencing;Sitting   Lower Body Dressing: Moderate assistance;Cueing for safety;Cueing for sequencing;Sit to/from stand   Toilet Transfer: Minimal assistance;Cueing for safety;Stand-pivot;Rolling walker (2 wheels);BSC/3in1 Toilet Transfer Details (indicate cue type and reason): cues on where to place hand due to decrease in cognition Toileting- Clothing Manipulation and Hygiene: Maximal assistance;Cueing for safety;Cueing for sequencing;Sit to/from stand       Functional mobility during ADLs: Minimal assistance;Rolling walker (2 wheels) General ADL Comments: needs assist to position RW  Vision Baseline Vision/History: 1 Wears glasses       Perception     Praxis      Pertinent Vitals/Pain Pain Assessment Pain Assessment: Faces Faces  Pain Scale: Hurts a little bit Pain Location: chest/L hip Pain Descriptors / Indicators: Aching Pain Intervention(s): Limited activity within patient's tolerance, Monitored during session, Repositioned     Hand Dominance Right   Extremity/Trunk Assessment Upper Extremity Assessment Upper Extremity Assessment: Overall WFL for tasks assessed   Lower Extremity Assessment Lower Extremity Assessment: Defer to PT evaluation       Communication Communication Communication: HOH   Cognition Arousal/Alertness: Awake/alert Behavior During Therapy: WFL for tasks assessed/performed Overall Cognitive Status: Within Functional Limits for tasks assessed                                       General Comments       Exercises     Shoulder Instructions      Home Living Family/patient expects to be discharged to:: Private residence Living Arrangements: Spouse/significant other Available Help at Discharge: Family;Available 24 hours/day Type of Home: House Home Access: Stairs to enter [employer] of Steps: 1   Home Layout: One level     Bathroom Shower/Tub: Occupational psychologist: Standard     Home Equipment: Conservation officer, nature (2 wheels);Shower seat;Grab bars - tub/shower;BSC/3in1;Wheelchair - manual   Additional Comments: son bumps pt up stairs in wheelchair      Prior Functioning/Environment Prior Level of Function : Needs assist;History of Falls (last six months)  Cognitive Assist : Mobility (cognitive);ADLs (cognitive) Mobility (Cognitive): Intermittent cues ADLs (Cognitive): Intermittent cues Physical Assist : Mobility (physical);ADLs (physical) Mobility (physical): Bed mobility;Transfers;Gait;Stairs ADLs (physical): Grooming;Bathing;Dressing;Toileting   ADLs Comments: Pt has support 24/7 with return to home for physical and cognative assistance        OT Problem List: Decreased strength;Decreased range of motion;Decreased activity  tolerance;Impaired balance (sitting and/or standing);Decreased safety awareness;Decreased knowledge of use of DME or AE;Cardiopulmonary status limiting activity;Pain      OT Treatment/Interventions: Self-care/ADL training;Therapeutic exercise;DME and/or AE instruction;Therapeutic activities;Patient/family education;Balance training    OT Goals(Current goals can be found in the care plan section) Acute Rehab OT Goals Patient Stated Goal: to have pt resume HH care OT Goal Formulation: With patient Time For Goal Achievement: 08/14/22 Potential to Achieve Goals: Good ADL Goals Pt Will Perform Grooming: with supervision;sitting;standing Pt Will Perform Upper Body Bathing: with supervision;sitting Pt Will Perform Lower Body Bathing: with min guard assist;sit to/from stand Pt Will Transfer to Toilet: with min guard assist;ambulating;regular height toilet;grab bars  OT Frequency: Min 2X/week    Co-evaluation              AM-PAC OT "6 Clicks" Daily Activity     Outcome Measure Help from another person eating meals?: A Little Help from another person taking care of personal grooming?: A Little Help from another person toileting, which includes using toliet, bedpan, or urinal?: A Lot Help from another person bathing (including washing, rinsing, drying)?: A Lot Help from another person to put on and taking off regular upper body clothing?: A Little Help from another person to put on and taking off regular lower body clothing?: A Lot 6 Click Score: 15   End of Session Equipment Utilized During Treatment: Gait belt;Rolling walker (2 wheels) Nurse Communication: Mobility status  Activity Tolerance: Patient tolerated treatment well Patient left: in  chair;with call bell/phone within reach;with chair alarm set;with family/visitor present  OT Visit Diagnosis: Unsteadiness on feet (R26.81);Other abnormalities of gait and mobility (R26.89);Repeated falls (R29.6);Muscle weakness (generalized)  (M62.81);Pain Pain - Right/Left:  (chest/lhip)                Time: 1100-1141 OT Time Calculation (min): 41 min Charges:  OT General Charges $OT Visit: 1 Visit OT Evaluation $OT Eval Low Complexity: 1 Low OT Treatments $Self Care/Home Management : 23-37 mins  Joeseph Amor OTR/L  Acute Rehab Services  [phone] office number [phone] pager number   Joeseph Amor 07/31/2022, 12:09 PM

## 2022-07-31 NOTE — Care Management (Signed)
1333 07-31-22 Case Manager spoke with patients daughter regarding home health services. Patient is currently active with [facility] and will need resumption orders for [facility] PT/OT. MD is aware to place orders. Daughter will transport patient home via private vehicle. No further needs identified at this time.

## 2022-07-31 NOTE — TOC Benefit Eligibility Note (Signed)
Patient Teacher, English as a foreign language completed.    The patient is currently admitted and upon discharge could be taking Eliquis 5 mg.  The current 30 day co-pay is $45.00.   The patient is currently admitted and upon discharge could be taking Xarelto 20 mg.  The current 30 day co-pay is $45.00.   The patient is insured through [employer] Patient Advocate Specialist [place] Patient Advocate Team Direct Number: [phone]  Fax: [phone]

## 2022-07-31 NOTE — Progress Notes (Signed)
[facility] for Lovenox  Indication: pulmonary embolus  Allergies  Allergen Reactions   Livalo [Pitavastatin] Other (See Comments)    Cramping and back pain   Haldol [Haloperidol] Other (See Comments)    Family does not like his affect when he is given this - makes him a "zombie."   Other Diarrhea and Other (See Comments)    Unnamed patch and tablets for dementia caused diarrhea (Possibly galantamine, from outside source)     Patient Measurements: Height: '5\' 8"'$  (172.7 cm) Weight: 65 kg (143 lb 4.8 oz) IBW/kg (Calculated) : 68.4  Vital Signs: Temp: 97.7 F (36.5 C) (08/17 1137) Temp Source: Oral (08/17 1137) BP: 119/74 (08/17 1137) Pulse Rate: 67 (08/17 1137)  Labs: Recent Labs    07/29/22 1436 07/29/22 1620 07/29/22 1857 07/30/22 0629 07/31/22 0408  HGB 12.0*  --   --  11.4* 11.4*  HCT 37.4*  --   --  35.7* 35.1*  PLT 327  --   --  307 299  APTT  --   --  29  --   --   LABPROT  --   --  14.4  --   --   INR  --   --  1.1  --   --   CREATININE 1.01  --   --  0.97 1.05  TROPONINIHS 4 4  --   --   --      Estimated Creatinine Clearance: 43 mL/min (by C-G formula based on SCr of 1.05 mg/dL).   Medical History: Past Medical History:  Diagnosis Date   Back pain    Bladder tumor    BPH (benign prostatic hyperplasia)    Clostridioides difficile infection 06/10/2022   Hyperlipidemia    Hypertension    Impaired hearing BILATERAL HEARING AIDS   Personal history of gastric ulcer    Pneumonia 06/10/2022     Assessment: 88 y/o M with PE on heparin drip, changed to Lovenox, labs above reviewed.   Plan to start apixaban for PE - Hgb stable at 11.4, plt 299. No s/sx of bleeding. Last dose of enox on 8/17'@0847'$ .  Goal of Therapy:  Monitor platelets by anticoagulation protocol: Yes   Plan:  Apixaban 10 mg BID for 7 days then 5 mg BID thereafter Daily CBC while inpatient Monitor for bleeding  Antonietta Jewel, PharmD,  BCCCP Clinical Pharmacist  Phone: [phone] 07/31/2022 1:16 PM  Please check AMION for all [place] phone numbers After 10:00 PM, call [place] [phone]

## 2022-07-31 NOTE — Discharge Instructions (Addendum)
Information on my medicine - ELIQUIS (apixaban)   Why was Eliquis prescribed for you? Eliquis was prescribed to treat blood clots that may have been found in the veins of your legs (deep vein thrombosis) or in your lungs (pulmonary embolism) and to reduce the risk of them occurring again.  What do You need to know about Eliquis ? The starting dose is 10 mg (two 5 mg tablets) taken TWICE daily for the FIRST SEVEN (7) DAYS, then on 08/07/2022 the dose is reduced to ONE 5 mg tablet taken TWICE daily.  Eliquis may be taken with or without food.   Try to take the dose about the same time in the morning and in the evening. If you have difficulty swallowing the tablet whole please discuss with your pharmacist how to take the medication safely.  Take Eliquis exactly as prescribed and DO NOT stop taking Eliquis without talking to the doctor who prescribed the medication.  Stopping may increase your risk of developing a new blood clot.  Refill your prescription before you run out.  After discharge, you should have regular check-up appointments with your healthcare provider that is prescribing your Eliquis.    What do you do if you miss a dose? If a dose of ELIQUIS is not taken at the scheduled time, take it as soon as possible on the same day and twice-daily administration should be resumed. The dose should not be doubled to make up for a missed dose.  Important Safety Information A possible side effect of Eliquis is bleeding. You should call your healthcare provider right away if you experience any of the following: Bleeding from an injury or your nose that does not stop. Unusual colored urine (red or dark brown) or unusual colored stools (red or black). Unusual bruising for unknown reasons. A serious fall or if you hit your head (even if there is no bleeding).  Some medicines may interact with Eliquis and might increase your risk of bleeding or clotting while on Eliquis. To help avoid  this, consult your healthcare provider or pharmacist prior to using any new prescription or non-prescription medications, including herbals, vitamins, non-steroidal anti-inflammatory drugs (NSAIDs) and supplements. Can use tylenol for pain/headache if needed.  This website has more information on Eliquis (apixaban): http://www.eliquis.com/eliquis/home      If would be restarted on fidaxomicin (Dificid) - the following link can be used to help with cost. [employer].ee

## 2022-07-31 NOTE — Discharge Summary (Signed)
Physician Discharge Summary   Patient: Paul [name] MRN: [id number] DOB: 1930-09-01  Admit date:     07/29/2022  Discharge date: 07/31/22  Discharge Physician: Alma Friendly   PCP: Crist Infante, MD   Recommendations at discharge:   Follow-up with PCP  Discharge Diagnoses: Principal Problem:   Acute pulmonary embolism ([place]) Active Problems:   C. difficile diarrhea   PAD (peripheral artery disease) (HCC)   Transitional cell carcinoma of bladder ([place])   Pulmonary embolism [facility])    Hospital Course: Paul [name] is a 88 y.o. male with history of CVA on Plavix, recently admitted for C. difficile, bacteremia with history of CHF hypertension was brought to the ER patient was having chest pain pleuritic in nature for the last 3 to 4 days. Still on vancomycin for C. difficile. In the ER patient underwent CT angiogram of the chest which shows pulm embolism with strain pattern.  Troponins and BNP were negative.  On-call pulmonologist was consulted.  Patient was admitted for further management.  Patient was started on heparin and subsequently transitioned to Lovenox with plans to transition to [place] upon discharge.   Assessment and Plan: Acute pulmonary embolism Currently hemodynamically stable CTA chest showed above, with right heart strain Echo with EF 60 to 65%, no regional wall motion abnormality, grade 1 diastolic dysfunction LE Doppler negative for DVT Pulmonology consulted due to right heart strain, appreciate recs Discharge on Eliquis, will hold Plavix for now due to increased risk of bleeding in this 88 year old male with history of frequent falls Monitor closely for signs of bleeding, if Eliquis is discontinued after completion of treatments or due to massive bleeding, Plavix can be restarted due to history of PAD/CVA   Recurrent C. Difficile Diarrhea improving Continue p.o. vancomycin    History of CVA Continue Zetia, hold Plavix as mentioned above    Hypertension Continue Imdur   Dementia Continue Aricept   History of frequent falls Daughter reports frequent falls, lives alone PT/OT-Home health PT/OT        Consultants: Pulmonology Procedures performed: None Disposition: Home health Diet recommendation:  Cardiac diet    DISCHARGE MEDICATION: Allergies as of 07/31/2022       Reactions   Livalo [pitavastatin] Other (See Comments)   Cramping and back pain   Haldol [haloperidol] Other (See Comments)   Family does not like his affect when he is given this - makes him a "zombie."   Other Diarrhea, Other (See Comments)   Unnamed patch and tablets for dementia caused diarrhea (Possibly galantamine, from outside source)         Medication List     STOP taking these medications    clopidogrel 75 MG tablet Commonly known as: PLAVIX   fidaxomicin 200 MG Tabs tablet Commonly known as: DIFICID       TAKE these medications    acetaminophen 500 MG tablet Commonly known as: TYLENOL Take 500-1,000 mg by mouth daily as needed for mild pain or headache.   apixaban 5 MG Tabs tablet Commonly known as: Eliquis Take 2 tablets ('10mg'$ ) twice daily for 7 days, then 1 tablet ('5mg'$ ) twice daily   donepezil 5 MG tablet Commonly known as: ARICEPT Take 5 mg by mouth daily.   Ensure Take 237 mLs by mouth daily.   escitalopram 20 MG tablet Commonly known as: LEXAPRO Take 20 mg by mouth daily.   ezetimibe 10 MG tablet Commonly known as: ZETIA Take 10 mg by mouth daily.   isosorbide mononitrate 60  MG 24 hr tablet Commonly known as: IMDUR Take 1 tablet (60 mg total) by mouth every morning. do not give if systolic blood pressure < 130 What changed:  when to take this additional instructions   PROBIOTIC PO Take 1 tablet by mouth daily.   vancomycin 125 MG capsule Commonly known as: VANCOCIN Take 125 mg by mouth every other day.        Follow-up Information     Crist Infante, MD. Schedule an appointment as  soon as possible for a visit in 1 week(s).   Specialty: Internal Medicine Contact information: [place] [address] [phone]         Wellington Hampshire, MD .   Specialty: Cardiology Contact information: [address] [place] [address] [phone]         [facility], [place] Follow up.   Specialty: Home Health Services Why: Physical Therapy-Occupational Therapy-office to call with visit times. Contact information: [place] [address] [phone]                Discharge Exam: Paul [name] Weights   07/29/22 1429 07/30/22 0145 07/31/22 0026  Weight: 62.6 kg 65.8 kg 65 kg   General: NAD  Cardiovascular: S1, S2 present Respiratory: CTAB Abdomen: Soft, nontender, nondistended, bowel sounds present Musculoskeletal: No bilateral pedal edema noted Skin: Normal Psychiatry: Normal mood   Condition at discharge: stable  The results of significant diagnostics from this hospitalization (including imaging, microbiology, ancillary and laboratory) are listed below for reference.   Imaging Studies: VAS [place] LOWER EXTREMITY VENOUS (DVT)  Result Date: 07/30/2022  Lower Venous DVT Study Patient Name:  Paul [name]  Date of Exam:   07/30/2022 Medical Rec #: [id number]        Accession #:    [id number] Date of Birth: [birth year]        Patient Gender: M Patient Age:   49 years Exam Location:  [facility] Procedure:      VAS [place] LOWER EXTREMITY VENOUS (DVT) Referring Phys: Gean Birchwood --------------------------------------------------------------------------------  Indications: Pulmonary embolism. Other Indications: Diagnosed with PAD; however, no prior studies available. Comparison Study: No prior studies. Performing Technologist: Darlin Coco RDMS, RVT  Examination Guidelines: A complete evaluation includes B-mode imaging, spectral Doppler, color Doppler, and power Doppler as needed of all accessible portions of  each vessel. Bilateral testing is considered an integral part of a complete examination. Limited examinations for reoccurring indications may be performed as noted. The reflux portion of the exam is performed with the patient in reverse Trendelenburg.  +---------+---------------+---------+-----------+----------+--------------+ RIGHT    CompressibilityPhasicitySpontaneityPropertiesThrombus Aging +---------+---------------+---------+-----------+----------+--------------+ CFV      Full           Yes      Yes                                 +---------+---------------+---------+-----------+----------+--------------+ SFJ      Full                                                        +---------+---------------+---------+-----------+----------+--------------+ FV Prox  Full                                                        +---------+---------------+---------+-----------+----------+--------------+  FV Mid   Full                                                        +---------+---------------+---------+-----------+----------+--------------+ FV DistalFull                                                        +---------+---------------+---------+-----------+----------+--------------+ PFV      Full                                                        +---------+---------------+---------+-----------+----------+--------------+ POP      Full           Yes      Yes                                 +---------+---------------+---------+-----------+----------+--------------+ PTV      Full                                                        +---------+---------------+---------+-----------+----------+--------------+ PERO     Full                                                        +---------+---------------+---------+-----------+----------+--------------+   +---------+---------------+---------+-----------+----------+--------------+ LEFT      CompressibilityPhasicitySpontaneityPropertiesThrombus Aging +---------+---------------+---------+-----------+----------+--------------+ CFV      Full           Yes      Yes                                 +---------+---------------+---------+-----------+----------+--------------+ SFJ      Full                                                        +---------+---------------+---------+-----------+----------+--------------+ FV Prox  Full                                                        +---------+---------------+---------+-----------+----------+--------------+ FV Mid   Full                                                        +---------+---------------+---------+-----------+----------+--------------+  FV DistalFull                                                        +---------+---------------+---------+-----------+----------+--------------+ PFV      Full                                                        +---------+---------------+---------+-----------+----------+--------------+ POP      Full           Yes      Yes                                 +---------+---------------+---------+-----------+----------+--------------+ PTV      Full                                                        +---------+---------------+---------+-----------+----------+--------------+ PERO     Full                                                        +---------+---------------+---------+-----------+----------+--------------+ Gastroc  Full                                                        +---------+---------------+---------+-----------+----------+--------------+     Summary: RIGHT: - There is no evidence of deep vein thrombosis in the lower extremity.  - No cystic structure found in the popliteal fossa.  LEFT: - There is no evidence of deep vein thrombosis in the lower extremity.  - No cystic structure found in the popliteal fossa.  -  Incidental: Left superficial femoral artery occlusion with reconstitution at the popliteal artery and monophasic flow present in the PTA and DPA.  *See table(s) above for measurements and observations. Electronically signed by Jamelle Haring on 07/30/2022 at 4:27:10 PM.    Final    ECHOCARDIOGRAM COMPLETE  Result Date: 07/30/2022    ECHOCARDIOGRAM REPORT   Patient Name:   TOBENNA NEEDS Date of Exam: 07/30/2022 Medical Rec #:  [id number]       Height:       68.0 in Accession #:    [id number]      Weight:       145.1 lb Date of Birth:  [birth year]       BSA:          1.783 m Patient Age:    30 years        BP:           154/65 mmHg Patient Gender: M               HR:  61 bpm. Exam Location:  Inpatient Procedure: 2D Echo, Cardiac Doppler, Color Doppler and Intracardiac            Opacification Agent Indications:    Pulmonary Embolus I26.09  History:        Patient has prior history of Echocardiogram examinations, most                 recent 01/31/2022. Risk Factors:Hypertension and Dyslipidemia.  Sonographer:    Eartha Inch Referring Phys: [id number] Patience  Sonographer Comments: Technically difficult study due to poor echo windows, suboptimal parasternal window, suboptimal apical window and suboptimal subcostal window. Image acquisition challenging due to patient body habitus and Image acquisition challenging due to respiratory motion. IMPRESSIONS  1. Left ventricular ejection fraction, by estimation, is 60 to 65%. The left ventricle has normal function. The left ventricle has no regional wall motion abnormalities. There is moderate concentric left ventricular hypertrophy. Left ventricular diastolic parameters are consistent with Grade I diastolic dysfunction (impaired relaxation).  2. Right ventricular systolic function is normal. The right ventricular size is normal.  3. The mitral valve is normal in structure. No evidence of mitral valve regurgitation. No evidence of mitral stenosis.  4. The  aortic valve is normal in structure. Aortic valve regurgitation is not visualized. No aortic stenosis is present.  5. The inferior vena cava is normal in size with greater than 50% respiratory variability, suggesting right atrial pressure of 3 mmHg. FINDINGS  Left Ventricle: Left ventricular ejection fraction, by estimation, is 60 to 65%. The left ventricle has normal function. The left ventricle has no regional wall motion abnormalities. Definity contrast agent was given IV to delineate the left ventricular  endocardial borders. The left ventricular internal cavity size was normal in size. There is moderate concentric left ventricular hypertrophy. Left ventricular diastolic parameters are consistent with Grade I diastolic dysfunction (impaired relaxation). Right Ventricle: The right ventricular size is normal. No increase in right ventricular wall thickness. Right ventricular systolic function is normal. Left Atrium: Left atrial size was normal in size. Right Atrium: Right atrial size was normal in size. Pericardium: There is no evidence of pericardial effusion. Presence of epicardial fat layer. Mitral Valve: The mitral valve is normal in structure. No evidence of mitral valve regurgitation. No evidence of mitral valve stenosis. MV peak gradient, 3.7 mmHg. The mean mitral valve gradient is 1.0 mmHg. Tricuspid Valve: The tricuspid valve is normal in structure. Tricuspid valve regurgitation is mild . No evidence of tricuspid stenosis. Aortic Valve: The aortic valve is normal in structure. Aortic valve regurgitation is not visualized. No aortic stenosis is present. Pulmonic Valve: The pulmonic valve was not well visualized. Pulmonic valve regurgitation is trivial. No evidence of pulmonic stenosis. Aorta: The aortic root is normal in size and structure. Venous: The inferior vena cava is normal in size with greater than 50% respiratory variability, suggesting right atrial pressure of 3 mmHg. IAS/Shunts: No atrial level  shunt detected by color flow Doppler.  LEFT VENTRICLE PLAX 2D LVIDd:         3.30 cm   Diastology LVIDs:         2.10 cm   LV e' medial:    5.33 cm/s LV PW:         1.60 cm   LV E/e' medial:  12.2 LV IVS:        1.50 cm   LV e' lateral:   6.68 cm/s LVOT diam:     1.70 cm  LV E/e' lateral: 9.7 LV SV:         53 LV SV Index:   30 LVOT Area:     2.27 cm  RIGHT VENTRICLE RV S prime:     14.10 cm/s TAPSE (M-mode): 2.2 cm LEFT ATRIUM           Index        RIGHT ATRIUM           Index LA diam:      3.00 cm 1.68 cm/m   RA Area:     11.70 cm LA Vol (A2C): 17.5 ml 9.81 ml/m   RA Volume:   27.40 ml  15.37 ml/m LA Vol (A4C): 27.4 ml 15.37 ml/m  AORTIC VALVE             PULMONIC VALVE LVOT Vmax:   116.00 cm/s PR End Diast Vel: 2.72 msec LVOT Vmean:  70.300 cm/s LVOT VTI:    0.232 m  AORTA Ao Root diam: 3.40 cm Ao Asc diam:  3.60 cm MITRAL VALVE               TRICUSPID VALVE MV Area (PHT): 2.13 cm    TR Peak grad:   25.6 mmHg MV Area VTI:   1.74 cm    TR Vmax:        253.00 cm/s MV Peak grad:  3.7 mmHg MV Mean grad:  1.0 mmHg    SHUNTS MV Vmax:       0.96 m/s    Systemic VTI:  0.23 m MV Vmean:      52.1 cm/s   Systemic Diam: 1.70 cm MV Decel Time: 356 msec MV E velocity: 65.10 cm/s MV A velocity: 95.10 cm/s MV E/A ratio:  0.68 Kardie Tobb DO Electronically signed by Berniece Salines DO Signature Date/Time: 07/30/2022/12:03:32 PM    Final    CT Angio Chest PE W and/or Wo Contrast  Result Date: 07/29/2022 CLINICAL DATA:  Positive D-dimer.  Chest pain. EXAM: CT ANGIOGRAPHY CHEST WITH CONTRAST TECHNIQUE: Multidetector CT imaging of the chest was performed using the standard protocol during bolus administration of intravenous contrast. Multiplanar CT image reconstructions and MIPs were obtained to evaluate the vascular anatomy. RADIATION DOSE REDUCTION: This exam was performed according to the departmental dose-optimization program which includes automated exposure control, adjustment of the mA and/or kV according to patient  size and/or use of iterative reconstruction technique. CONTRAST:  20m OMNIPAQUE IOHEXOL 350 MG/ML SOLN COMPARISON:  CT chest abdomen and pelvis 06/20/2022 FINDINGS: Cardiovascular: There are findings concerning for subsegmental pulmonary emboli in the left lower lobe in the region of focal lung opacity. This is new from prior examination. No other pulmonary emboli are seen. Aorta is ectatic without focal dilatation. Heart size is within normal limits. There is no pericardial effusion. There are atherosclerotic calcifications of the aorta. Mediastinum/Nodes: No enlarged mediastinal, hilar, or axillary lymph nodes. Thyroid gland, trachea, and esophagus demonstrate no significant findings. Lungs/Pleura: Mild emphysematous changes are again seen. Small to moderate-sized left pleural effusion with diffuse pleural thickening appears similar to the prior examination. Adjacent oval parenchymal density in the left lower lobe measures 2.3 x 6.7 cm it also appears unchanged. No air bronchograms seen in this region. No new focal lung infiltrate or pneumothorax. Trachea and central airways are patent. Upper Abdomen: No acute abnormality. Musculoskeletal: No chest wall abnormality. No acute osseous findings. Degenerative changes affect the spine. Review of the MIP images confirms the above findings. IMPRESSION: 1. Subsegmental left lower  lobe pulmonary emboli in the region of focal lung opacity, new from prior. 2. Positive for acute PE with CTevidence of right heart strain (RV/LV Ratio = 1.3) consistent with at least submassive (intermediate risk) PE. The presence of right heart strain has been associated with an increased risk of morbidity and mortality. 3. Stable small to moderate-sized left pleural effusion with diffuse pleural thickening. Findings may be related to empyema. 4. Stable left lower lobe consolidation, possibly rounded atelectasis. Underlying mass not excluded. Aortic Atherosclerosis (ICD10-I70.0) and Emphysema  (ICD10-J43.9). Electronically Signed   By: Ronney Asters M.D.   On: 07/29/2022 18:11   DG Chest Port 1 View  Result Date: 07/29/2022 CLINICAL DATA:  Chest pain. EXAM: PORTABLE CHEST 1 VIEW COMPARISON:  06/20/2022 FINDINGS: Heart size and mediastinal contours are stable. Aortic atherosclerotic calcifications. Unchanged appearance of moderate loculated left pleural effusion with overlying atelectasis/consolidation. No new findings identified. Visualized osseous structures appear intact. IMPRESSION: 1. Stable loculated left pleural effusion with overlying atelectasis/consolidation. 2. No new findings. Electronically Signed   By: Kerby Moors M.D.   On: 07/29/2022 15:14    Microbiology: Results for orders placed or performed during the hospital encounter of 06/20/22  Blood Culture (routine x 2)     Status: Abnormal   Collection Time: 06/20/22  1:40 AM   Specimen: BLOOD  Result Value Ref Range Status   Specimen Description BLOOD LEFT ANTECUBITAL  Final   Special Requests   Final    BOTTLES DRAWN AEROBIC AND ANAEROBIC Blood Culture adequate volume   Culture  Setup Time   Final    GRAM NEGATIVE RODS AEROBIC BOTTLE ONLY CRITICAL RESULT CALLED TO, READ BACK BY AND VERIFIED WITH: T RUDISILL,PHARMD'@0136'$  06/21/22 [place] Performed at [facility], [address] [place], [place] [address]    Culture PANTOEA AGGLOMERANS (A)  Final   Report Status 06/23/2022 FINAL  Final   Organism ID, Bacteria PANTOEA AGGLOMERANS  Final      Susceptibility   Pantoea agglomerans - MIC*    CEFAZOLIN <=4 SENSITIVE Sensitive     CEFEPIME <=0.12 SENSITIVE Sensitive     CEFTAZIDIME <=1 SENSITIVE Sensitive     CEFTRIAXONE <=0.25 SENSITIVE Sensitive     CIPROFLOXACIN <=0.25 SENSITIVE Sensitive     GENTAMICIN <=1 SENSITIVE Sensitive     IMIPENEM <=0.25 SENSITIVE Sensitive     TRIMETH/SULFA <=20 SENSITIVE Sensitive     PIP/TAZO <=4 SENSITIVE Sensitive     * PANTOEA AGGLOMERANS  Blood Culture ID Panel (Reflexed)      Status: Abnormal   Collection Time: 06/20/22  1:40 AM  Result Value Ref Range Status   Enterococcus faecalis NOT DETECTED NOT DETECTED Final   Enterococcus Faecium NOT DETECTED NOT DETECTED Final   Listeria monocytogenes NOT DETECTED NOT DETECTED Final   Staphylococcus species NOT DETECTED NOT DETECTED Final   Staphylococcus aureus (BCID) NOT DETECTED NOT DETECTED Final   Staphylococcus epidermidis NOT DETECTED NOT DETECTED Final   Staphylococcus lugdunensis NOT DETECTED NOT DETECTED Final   Streptococcus species NOT DETECTED NOT DETECTED Final   Streptococcus agalactiae NOT DETECTED NOT DETECTED Final   Streptococcus pneumoniae NOT DETECTED NOT DETECTED Final   Streptococcus pyogenes NOT DETECTED NOT DETECTED Final   A.calcoaceticus-baumannii NOT DETECTED NOT DETECTED Final   Bacteroides fragilis NOT DETECTED NOT DETECTED Final   Enterobacterales DETECTED (A) NOT DETECTED Final    Comment: Enterobacterales represent a large order of gram negative bacteria, not a single organism. Refer to culture for further identification. CRITICAL RESULT  CALLED TO, READ BACK BY AND VERIFIED WITH: T RUDISILL,PHARMD'@0138'$  06/21/22 [place]    Enterobacter cloacae complex NOT DETECTED NOT DETECTED Final   Escherichia coli NOT DETECTED NOT DETECTED Final   Klebsiella aerogenes NOT DETECTED NOT DETECTED Final   Klebsiella oxytoca NOT DETECTED NOT DETECTED Final   Klebsiella pneumoniae NOT DETECTED NOT DETECTED Final   Proteus species NOT DETECTED NOT DETECTED Final   Salmonella species NOT DETECTED NOT DETECTED Final   Serratia marcescens NOT DETECTED NOT DETECTED Final   Haemophilus influenzae NOT DETECTED NOT DETECTED Final   Neisseria meningitidis NOT DETECTED NOT DETECTED Final   Pseudomonas aeruginosa NOT DETECTED NOT DETECTED Final   Stenotrophomonas maltophilia NOT DETECTED NOT DETECTED Final   Candida albicans NOT DETECTED NOT DETECTED Final   Candida auris NOT DETECTED NOT DETECTED Final   Candida  glabrata NOT DETECTED NOT DETECTED Final   Candida krusei NOT DETECTED NOT DETECTED Final   Candida parapsilosis NOT DETECTED NOT DETECTED Final   Candida tropicalis NOT DETECTED NOT DETECTED Final   Cryptococcus neoformans/gattii NOT DETECTED NOT DETECTED Final   CTX-M ESBL NOT DETECTED NOT DETECTED Final   Carbapenem resistance IMP NOT DETECTED NOT DETECTED Final   Carbapenem resistance KPC NOT DETECTED NOT DETECTED Final   Carbapenem resistance NDM NOT DETECTED NOT DETECTED Final   Carbapenem resist OXA 48 LIKE NOT DETECTED NOT DETECTED Final   Carbapenem resistance VIM NOT DETECTED NOT DETECTED Final    Comment: Performed at [facility], [address], [place], [place] [address]  Blood Culture (routine x 2)     Status: None   Collection Time: 06/20/22  2:02 AM   Specimen: BLOOD  Result Value Ref Range Status   Specimen Description BLOOD RIGHT ANTECUBITAL  Final   Special Requests   Final    BOTTLES DRAWN AEROBIC AND ANAEROBIC Blood Culture adequate volume   Culture   Final    NO GROWTH 5 DAYS Performed at [facility], [place] [address], [place], [place] [address]    Report Status 06/25/2022 FINAL  Final  Urine Culture     Status: Abnormal   Collection Time: 06/20/22  7:36 AM   Specimen: In/Out Cath Urine  Result Value Ref Range Status   Specimen Description IN/OUT CATH URINE  Final   Special Requests NONE  Final   Culture (A)  Final    <10,000 COLONIES/mL INSIGNIFICANT GROWTH Performed at [facility] [address], [place], [place] [address]    Report Status 06/21/2022 FINAL  Final  C Difficile Quick Screen w PCR reflex     Status: Abnormal   Collection Time: 06/20/22  7:36 AM   Specimen: Stool  Result Value Ref Range Status   C Diff antigen POSITIVE (A) NEGATIVE Final   C Diff toxin POSITIVE (A) NEGATIVE Final   C Diff interpretation Toxin producing C. difficile detected.  Final    Comment: CRITICAL RESULT CALLED TO, READ BACK BY AND VERIFIED  WITH: Sammie Bench RN ,AT [phone] 06/20/22 [place] Performed at [facility], [place] [address] [place], [place] [address]   Gastrointestinal Panel by PCR , Stool     Status: None   Collection Time: 06/20/22  7:36 AM   Specimen: Stool  Result Value Ref Range Status   Campylobacter species NOT DETECTED NOT DETECTED Final   Plesimonas shigelloides NOT DETECTED NOT DETECTED Final   Salmonella species NOT DETECTED NOT DETECTED Final   Yersinia enterocolitica NOT DETECTED  NOT DETECTED Final   Vibrio species NOT DETECTED NOT DETECTED Final   Vibrio cholerae NOT DETECTED NOT DETECTED Final   Enteroaggregative E coli (EAEC) NOT DETECTED NOT DETECTED Final   Enteropathogenic E coli (EPEC) NOT DETECTED NOT DETECTED Final   Enterotoxigenic E coli (ETEC) NOT DETECTED NOT DETECTED Final   Shiga like toxin producing E coli (STEC) NOT DETECTED NOT DETECTED Final   Shigella/Enteroinvasive E coli (EIEC) NOT DETECTED NOT DETECTED Final   Cryptosporidium NOT DETECTED NOT DETECTED Final   Cyclospora cayetanensis NOT DETECTED NOT DETECTED Final   Entamoeba histolytica NOT DETECTED NOT DETECTED Final   Giardia lamblia NOT DETECTED NOT DETECTED Final   Adenovirus F40/41 NOT DETECTED NOT DETECTED Final   Astrovirus NOT DETECTED NOT DETECTED Final   Norovirus GI/GII NOT DETECTED NOT DETECTED Final   Rotavirus A NOT DETECTED NOT DETECTED Final   Sapovirus (I, II, IV, and V) NOT DETECTED NOT DETECTED Final    Comment: Performed at [facility], [address] [place], [place] [address]  Culture, blood (Routine X 2) w Reflex to ID Panel     Status: None   Collection Time: 06/21/22  5:03 AM   Specimen: BLOOD LEFT HAND  Result Value Ref Range Status   Specimen Description BLOOD LEFT HAND  Final   Special Requests   Final    BOTTLES DRAWN AEROBIC AND ANAEROBIC Blood Culture adequate volume   Culture   Final    NO GROWTH 5 DAYS Performed at [facility], [place] [address] [place], [place]  [address]    Report Status 06/26/2022 FINAL  Final  Culture, blood (Routine X 2) w Reflex to ID Panel     Status: None   Collection Time: 06/21/22  5:03 AM   Specimen: BLOOD RIGHT HAND  Result Value Ref Range Status   Specimen Description BLOOD RIGHT HAND  Final   Special Requests   Final    BOTTLES DRAWN AEROBIC AND ANAEROBIC Blood Culture adequate volume   Culture   Final    NO GROWTH 5 DAYS Performed at [facility], [place] [address] [place],  [address]    Report Status 06/26/2022 FINAL  Final    Labs: CBC: Recent Labs  Lab 07/29/22 1436 07/30/22 0629 07/31/22 0408  WBC 6.7 5.0 4.8  NEUTROABS  --  3.4 3.2  HGB 12.0* 11.4* 11.4*  HCT 37.4* 35.7* 35.1*  MCV 88.2 88.1 87.3  PLT 327 307 606   Basic Metabolic Panel: Recent Labs  Lab 07/29/22 1436 07/30/22 0629 07/31/22 0408  NA 138 140 141  K 4.2 4.3 4.1  CL 103 107 108  CO2 '28 26 26  '$ GLUCOSE 112* 87 81  BUN '21 17 15  '$ CREATININE 1.01 0.97 1.05  CALCIUM 9.1 9.0 9.0   Liver Function Tests: Recent Labs  Lab 07/30/22 0629  AST 17  ALT 16  ALKPHOS 60  BILITOT 0.6  PROT 5.9*  ALBUMIN 3.3*   CBG: No results for input(s): "GLUCAP" in the last 168 hours.  Discharge time spent: greater than 30 minutes.  Signed: Alma Friendly, MD [facility] 07/31/2022

## 2022-08-05 ENCOUNTER — Ambulatory Visit: Payer: PPO | Admitting: Cardiovascular Disease

## 2022-08-05 DIAGNOSIS — Z7902 Long term (current) use of antithrombotics/antiplatelets: Secondary | ICD-10-CM | POA: Diagnosis not present

## 2022-08-05 DIAGNOSIS — E785 Hyperlipidemia, unspecified: Secondary | ICD-10-CM | POA: Diagnosis not present

## 2022-08-05 DIAGNOSIS — F0393 Unspecified dementia, unspecified severity, with mood disturbance: Secondary | ICD-10-CM | POA: Diagnosis not present

## 2022-08-05 DIAGNOSIS — I110 Hypertensive heart disease with heart failure: Secondary | ICD-10-CM | POA: Diagnosis not present

## 2022-08-05 DIAGNOSIS — I5030 Unspecified diastolic (congestive) heart failure: Secondary | ICD-10-CM | POA: Diagnosis not present

## 2022-08-05 DIAGNOSIS — F03918 Unspecified dementia, unspecified severity, with other behavioral disturbance: Secondary | ICD-10-CM | POA: Diagnosis not present

## 2022-08-05 DIAGNOSIS — N289 Disorder of kidney and ureter, unspecified: Secondary | ICD-10-CM | POA: Diagnosis not present

## 2022-08-05 DIAGNOSIS — N400 Enlarged prostate without lower urinary tract symptoms: Secondary | ICD-10-CM | POA: Diagnosis not present

## 2022-08-05 DIAGNOSIS — Z9181 History of falling: Secondary | ICD-10-CM | POA: Diagnosis not present

## 2022-08-05 DIAGNOSIS — I739 Peripheral vascular disease, unspecified: Secondary | ICD-10-CM | POA: Diagnosis not present

## 2022-08-05 DIAGNOSIS — D72829 Elevated white blood cell count, unspecified: Secondary | ICD-10-CM | POA: Diagnosis not present

## 2022-08-05 DIAGNOSIS — R1310 Dysphagia, unspecified: Secondary | ICD-10-CM | POA: Diagnosis not present

## 2022-08-05 DIAGNOSIS — Z8673 Personal history of transient ischemic attack (TIA), and cerebral infarction without residual deficits: Secondary | ICD-10-CM | POA: Diagnosis not present

## 2022-08-05 DIAGNOSIS — J90 Pleural effusion, not elsewhere classified: Secondary | ICD-10-CM | POA: Diagnosis not present

## 2022-08-05 DIAGNOSIS — D649 Anemia, unspecified: Secondary | ICD-10-CM | POA: Diagnosis not present

## 2022-08-05 DIAGNOSIS — F32A Depression, unspecified: Secondary | ICD-10-CM | POA: Diagnosis not present

## 2022-08-05 DIAGNOSIS — E440 Moderate protein-calorie malnutrition: Secondary | ICD-10-CM | POA: Diagnosis not present

## 2022-08-05 DIAGNOSIS — H9190 Unspecified hearing loss, unspecified ear: Secondary | ICD-10-CM | POA: Diagnosis not present

## 2022-08-05 DIAGNOSIS — J9601 Acute respiratory failure with hypoxia: Secondary | ICD-10-CM | POA: Diagnosis not present

## 2022-08-05 DIAGNOSIS — Z8551 Personal history of malignant neoplasm of bladder: Secondary | ICD-10-CM | POA: Diagnosis not present

## 2022-08-05 DIAGNOSIS — J189 Pneumonia, unspecified organism: Secondary | ICD-10-CM | POA: Diagnosis not present

## 2022-08-05 DIAGNOSIS — M549 Dorsalgia, unspecified: Secondary | ICD-10-CM | POA: Diagnosis not present

## 2022-08-05 DIAGNOSIS — Z7982 Long term (current) use of aspirin: Secondary | ICD-10-CM | POA: Diagnosis not present

## 2022-08-05 DIAGNOSIS — K259 Gastric ulcer, unspecified as acute or chronic, without hemorrhage or perforation: Secondary | ICD-10-CM | POA: Diagnosis not present

## 2022-08-06 ENCOUNTER — Telehealth: Payer: Self-pay | Admitting: Adult Health

## 2022-08-06 NOTE — Telephone Encounter (Signed)
Pt daughter [name]) is calling and stated Pt has just being release from hospital and wants to know if this appointment is necessary.

## 2022-08-06 NOTE — Telephone Encounter (Signed)
Contacted pt daughter, she stated he was in hospital 3 times and was just released. He has other health issues like pneumonia, cdiff, sepsis, blood clots and its hard to get him out at his current condition. He is doing ok from a stroke standpoint. Offered a mychart visit she declined due to it being nothing but talking. She explained in detailed all the doctors and medication changes he has had.  She then declined to cancel visit and will keep it for now

## 2022-08-10 DIAGNOSIS — L8990 Pressure ulcer of unspecified site, unspecified stage: Secondary | ICD-10-CM | POA: Diagnosis not present

## 2022-08-10 DIAGNOSIS — E785 Hyperlipidemia, unspecified: Secondary | ICD-10-CM | POA: Diagnosis not present

## 2022-08-10 DIAGNOSIS — F0390 Unspecified dementia without behavioral disturbance: Secondary | ICD-10-CM | POA: Diagnosis not present

## 2022-08-10 DIAGNOSIS — I639 Cerebral infarction, unspecified: Secondary | ICD-10-CM | POA: Diagnosis not present

## 2022-08-11 DIAGNOSIS — R296 Repeated falls: Secondary | ICD-10-CM | POA: Diagnosis not present

## 2022-08-11 DIAGNOSIS — I2609 Other pulmonary embolism with acute cor pulmonale: Secondary | ICD-10-CM | POA: Diagnosis not present

## 2022-08-11 DIAGNOSIS — F0390 Unspecified dementia without behavioral disturbance: Secondary | ICD-10-CM | POA: Diagnosis not present

## 2022-08-11 DIAGNOSIS — I10 Essential (primary) hypertension: Secondary | ICD-10-CM | POA: Diagnosis not present

## 2022-08-11 DIAGNOSIS — Z8673 Personal history of transient ischemic attack (TIA), and cerebral infarction without residual deficits: Secondary | ICD-10-CM | POA: Diagnosis not present

## 2022-08-11 DIAGNOSIS — A0472 Enterocolitis due to Clostridium difficile, not specified as recurrent: Secondary | ICD-10-CM | POA: Diagnosis not present

## 2022-08-26 ENCOUNTER — Ambulatory Visit: Payer: PPO | Admitting: Adult Health

## 2022-09-02 ENCOUNTER — Encounter: Payer: Self-pay | Admitting: Cardiovascular Disease

## 2022-09-02 ENCOUNTER — Ambulatory Visit: Payer: PPO | Attending: Cardiovascular Disease | Admitting: Cardiovascular Disease

## 2022-09-02 VITALS — BP 125/62 | HR 62 | Ht 68.0 in | Wt 146.6 lb

## 2022-09-02 DIAGNOSIS — E785 Hyperlipidemia, unspecified: Secondary | ICD-10-CM

## 2022-09-02 DIAGNOSIS — R0789 Other chest pain: Secondary | ICD-10-CM

## 2022-09-02 DIAGNOSIS — I10 Essential (primary) hypertension: Secondary | ICD-10-CM

## 2022-09-02 DIAGNOSIS — I739 Peripheral vascular disease, unspecified: Secondary | ICD-10-CM

## 2022-09-02 NOTE — Patient Instructions (Signed)
Medication Instructions:  No changes *If you need a refill on your cardiac medications before your next appointment, please call your pharmacy*   Lab Work: None ordered If you have labs (blood work) drawn today and your tests are completely normal, you will receive your results only by: MyChart Message (if you have MyChart) OR A paper copy in the mail If you have any lab test that is abnormal or we need to change your treatment, we will call you to review the results.   Testing/Procedures: None ordered   Follow-Up: At [facility], you and your health needs are our priority.  As part of our continuing mission to provide you with exceptional heart care, we have created designated Provider Care Teams.  These Care Teams include your primary Cardiologist (physician) and Advanced Practice Providers (APPs -  Physician Assistants and Nurse Practitioners) who all work together to provide you with the care you need, when you need it.  We recommend signing up for the patient portal called "MyChart".  Sign up information is provided on this After Visit Summary.  MyChart is used to connect with patients for Virtual Visits (Telemedicine).  Patients are able to view lab/test results, encounter notes, upcoming appointments, etc.  Non-urgent messages can be sent to your provider as well.   To learn more about what you can do with MyChart, go to [email or web address].    Your next appointment:   Follow up as needed with Dr. Arida  Important Information About Sugar       

## 2022-09-02 NOTE — Progress Notes (Signed)
Cardiology Office Note   Date:  09/02/2022   ID:  ZAINE ELSASS, DOB September 10, 1930, MRN [id number]  PCP:  Crist Infante, MD  Cardiologist:   Kathlyn Sacramento, MD   No chief complaint on file.     History of Present Illness: Paul [name] is a 88 y.o. male who is here today for a follow-up visit regarding chest pain.   The patient was seen by me in the past most recently in January 2022. Previous echocardiogram and treadmill stress test were unremarkable in 2013. He was diagnosed with peripheral arterial disease in 2013. ABI which was normal on the right side and mildly reduced on the left side at 0.72. Arterial duplex which showed severe focal left mid SFA stenosis. He was treated medically given lack of symptoms. He had bladder cancer in March of 2013 and underwent treatment with localized intra- bladder injection . He did have a Lexiscan Myoview done in September 2016 which overall was low risk although there was small reversibility in the distal anterior and apical segment. EF was normal. He has chronic medical conditions include hyperlipidemia, dementia, CVA and hypertension.  He had 4 hospitalizations in 2023.  He was hospitalized in February with a stroke.  He was hospitalized in June with pneumonia and mental status change.  He was hospitalized in July with C. difficile colitis and gram-negative bacteremia. He was hospitalized in August with pleuritic chest pain and was found to have pulmonary embolism.  He is being anticoagulated with Eliquis.  He did have persistent sharp chest pain after that and thus his family called and scheduled appointment with me.  However, over the last 2 weeks, he has not had any chest pain symptoms. He has dementia but memory seems to be stable. He had an echocardiogram while hospitalized which showed normal LV systolic function with no significant valvular abnormalities.   Past Medical History:  Diagnosis Date   Back pain    Bladder tumor    BPH  (benign prostatic hyperplasia)    Clostridioides difficile infection 06/10/2022   Hyperlipidemia    Hypertension    Impaired hearing BILATERAL HEARING AIDS   Personal history of gastric ulcer    Pneumonia 06/10/2022    Past Surgical History:  Procedure Laterality Date   APPENDECTOMY  1957   CATARACT EXTRACTION W/ INTRAOCULAR LENS  IMPLANT, BILATERAL     CYSTOSCOPY WITH BIOPSY  03/08/2012   Procedure: CYSTOSCOPY WITH BIOPSY;  Surgeon: Claybon Jabs, MD;  Location[facility];  Service: Urology;  Laterality: N/A;  gyrus   TONSILLECTOMY  CHILD   TRANSURETHRAL INCISION OF PROSTATE  03/08/2012   Procedure: TRANSURETHRAL INCISION OF THE PROSTATE (TUIP);  Surgeon: Claybon Jabs, MD;  Location[facility];  Service: Urology;  Laterality: N/A;   TRANSURETHRAL RESECTION OF BLADDER TUMOR  01/26/2012   Procedure: TRANSURETHRAL RESECTION OF BLADDER TUMOR (TURBT);  Surgeon: Claybon Jabs, MD;  Location: [facility];  Service: Urology;  Laterality: N/A;  GYRUS MYTOMICIN C      Current Outpatient Medications  Medication Sig Dispense Refill   acetaminophen (TYLENOL) 500 MG tablet Take 500-1,000 mg by mouth daily as needed for mild pain or headache.     apixaban (ELIQUIS) 5 MG TABS tablet Take 2 tablets ('10mg'$ ) twice daily for 7 days, then 1 tablet ('5mg'$ ) twice daily 60 tablet 0   donepezil (ARICEPT) 5 MG tablet Take 5 mg by mouth daily.     Ensure (ENSURE)  Take 237 mLs by mouth daily.     escitalopram (LEXAPRO) 20 MG tablet Take 20 mg by mouth daily.     ezetimibe (ZETIA) 10 MG tablet Take 10 mg by mouth daily.     isosorbide mononitrate (IMDUR) 60 MG 24 hr tablet Take 1 tablet (60 mg total) by mouth every morning. do not give if systolic blood pressure < 130 (Patient taking differently: Take 60 mg by mouth daily.) 30 tablet 0   Probiotic Product (PROBIOTIC PO) Take 1 tablet by mouth daily.     vancomycin (VANCOCIN) 125 MG capsule Take 125 mg by mouth  every other day.     No current facility-administered medications for this visit.    Allergies:   Livalo [pitavastatin], Haldol [haloperidol], and Other    Social History:  The patient  reports that he quit smoking about 41 years ago. His smoking use included cigarettes. He has never used smokeless tobacco. He reports that he does not drink alcohol and does not use drugs.   Family History:  The patient's family history includes Hypertension in his father; Kidney cancer in his mother.    ROS:  Please see the history of present illness.   Otherwise, review of systems are positive for none.   All other systems are reviewed and negative.    PHYSICAL EXAM: VS:  BP 125/62   Pulse 62   Ht '5\' 8"'$  (1.727 m)   Wt 146 lb 9.6 oz (66.5 kg)   SpO2 96%   BMI 22.29 kg/m  , BMI Body mass index is 22.29 kg/m. GEN: Well nourished, well developed, in no acute distress  HEENT: normal  Neck: no JVD, carotid bruits, or masses Cardiac: RRR; no murmurs, rubs, or gallops,no edema  Respiratory:  clear to auscultation bilaterally, normal work of breathing GI: soft, nontender, nondistended, + BS MS: no deformity or atrophy  Skin: warm and dry, no rash Neuro:  Strength and sensation are intact Psych: euthymic mood, full affect   EKG:  EKG is ordered today. The ekg ordered today demonstrates normal sinus rhythm with no significant ST or T wave changes.  Left anterior fascicular block.   Recent Labs: 01/31/2022: TSH 2.837 06/24/2022: Magnesium 2.0 07/29/2022: B Natriuretic Peptide 31.0 07/30/2022: ALT 16 07/31/2022: BUN 15; Creatinine, Ser 1.05; Hemoglobin 11.4; Platelets 299; Potassium 4.1; Sodium 141    Lipid Panel    Component Value Date/Time   CHOL 133 01/31/2022 0250   TRIG 128 01/31/2022 0250   HDL 39 (L) 01/31/2022 0250   CHOLHDL 3.4 01/31/2022 0250   VLDL 26 01/31/2022 0250   LDLCALC 68 01/31/2022 0250      Wt Readings from Last 3 Encounters:  09/02/22 146 lb 9.6 oz (66.5 kg)   07/31/22 143 lb 4.8 oz (65 kg)  06/23/22 144 lb 2.9 oz (65.4 kg)          No data to display            ASSESSMENT AND PLAN:  1.  Atypical chest pain: His chest pain was sharp and likely was related to pulmonary embolism.  No ischemic changes on EKG.  Echocardiogram in August showed normal LV systolic function and wall motion.  I do not recommend further ischemic cardiac evaluation at the present time.  2. Peripheral arterial disease: No claudication.  His mobility is somewhat limited.  3. Essential hypertension: Blood pressures controlled.  4. Hyperlipidemia: Currently on Zetia.  5.  Recent pulmonary embolism: Currently on anticoagulation with Eliquis.  Disposition:   FU with me as needed  Signed,  Kathlyn Sacramento, MD  09/02/2022 1:20 PM    [facility]

## 2022-09-05 ENCOUNTER — Telehealth: Payer: Self-pay | Admitting: Adult Health

## 2022-09-05 NOTE — Telephone Encounter (Signed)
Pt daughter is calling. Stated she would like to discuss with a nurse if a appointment is needed. Stated it hard to transport Pt to appointment. Said Pt was recently in the hospital and they ran ever test she thought that was needed.

## 2022-09-08 ENCOUNTER — Ambulatory Visit: Payer: PPO | Admitting: Adult Health

## 2022-09-08 NOTE — Telephone Encounter (Signed)
As long as he is closely being followed by PCP Dr. Joylene Draft, we can hold off on scheduling any follow-up visit per daughter request. Okay to call down the road with any stroke related questions or concerns if needed.  Thank you.

## 2022-09-08 NOTE — Telephone Encounter (Signed)
Called daughter [name], advised her the hospital notes advised he follow up with PCP. He recently saw PCP and cardiologist who released him, see as needed. No further testing recommended. [name] stated she didn't feel any follow up is needed with neurology, they "know what to do" and she asked if he still needs to FU. I advised I'll check with NP and let her know. She verbalized understanding, appreciation.

## 2022-09-10 DIAGNOSIS — F0390 Unspecified dementia without behavioral disturbance: Secondary | ICD-10-CM | POA: Diagnosis not present

## 2022-09-10 DIAGNOSIS — I639 Cerebral infarction, unspecified: Secondary | ICD-10-CM | POA: Diagnosis not present

## 2022-09-10 DIAGNOSIS — L8990 Pressure ulcer of unspecified site, unspecified stage: Secondary | ICD-10-CM | POA: Diagnosis not present

## 2022-09-10 DIAGNOSIS — E785 Hyperlipidemia, unspecified: Secondary | ICD-10-CM | POA: Diagnosis not present

## 2022-09-16 ENCOUNTER — Encounter (HOSPITAL_COMMUNITY): Payer: Self-pay | Admitting: Emergency Medicine

## 2022-09-16 ENCOUNTER — Emergency Department (HOSPITAL_COMMUNITY): Payer: PPO

## 2022-09-16 ENCOUNTER — Inpatient Hospital Stay (HOSPITAL_COMMUNITY)
Admission: EM | Admit: 2022-09-16 | Discharge: 2022-09-21 | DRG: 871 | Disposition: A | Discharge status: Home Health | Payer: PPO | Attending: Internal Medicine | Admitting: Internal Medicine

## 2022-09-16 ENCOUNTER — Other Ambulatory Visit: Payer: Self-pay

## 2022-09-16 DIAGNOSIS — E860 Dehydration: Secondary | ICD-10-CM | POA: Diagnosis not present

## 2022-09-16 DIAGNOSIS — N400 Enlarged prostate without lower urinary tract symptoms: Secondary | ICD-10-CM | POA: Diagnosis present

## 2022-09-16 DIAGNOSIS — R531 Weakness: Secondary | ICD-10-CM | POA: Diagnosis not present

## 2022-09-16 DIAGNOSIS — E785 Hyperlipidemia, unspecified: Secondary | ICD-10-CM | POA: Diagnosis not present

## 2022-09-16 DIAGNOSIS — Z87891 Personal history of nicotine dependence: Secondary | ICD-10-CM

## 2022-09-16 DIAGNOSIS — J690 Pneumonitis due to inhalation of food and vomit: Secondary | ICD-10-CM | POA: Diagnosis present

## 2022-09-16 DIAGNOSIS — Z7901 Long term (current) use of anticoagulants: Secondary | ICD-10-CM | POA: Diagnosis not present

## 2022-09-16 DIAGNOSIS — Z8551 Personal history of malignant neoplasm of bladder: Secondary | ICD-10-CM | POA: Diagnosis not present

## 2022-09-16 DIAGNOSIS — I5032 Chronic diastolic (congestive) heart failure: Secondary | ICD-10-CM | POA: Diagnosis present

## 2022-09-16 DIAGNOSIS — I69391 Dysphagia following cerebral infarction: Secondary | ICD-10-CM

## 2022-09-16 DIAGNOSIS — F05 Delirium due to known physiological condition: Secondary | ICD-10-CM | POA: Diagnosis present

## 2022-09-16 DIAGNOSIS — I110 Hypertensive heart disease with heart failure: Secondary | ICD-10-CM | POA: Diagnosis present

## 2022-09-16 DIAGNOSIS — Z8249 Family history of ischemic heart disease and other diseases of the circulatory system: Secondary | ICD-10-CM

## 2022-09-16 DIAGNOSIS — I878 Other specified disorders of veins: Secondary | ICD-10-CM | POA: Diagnosis not present

## 2022-09-16 DIAGNOSIS — K5730 Diverticulosis of large intestine without perforation or abscess without bleeding: Secondary | ICD-10-CM | POA: Diagnosis not present

## 2022-09-16 DIAGNOSIS — A0472 Enterocolitis due to Clostridium difficile, not specified as recurrent: Secondary | ICD-10-CM | POA: Diagnosis present

## 2022-09-16 DIAGNOSIS — R000 Tachycardia, unspecified: Secondary | ICD-10-CM | POA: Diagnosis not present

## 2022-09-16 DIAGNOSIS — R140 Abdominal distension (gaseous): Secondary | ICD-10-CM | POA: Diagnosis not present

## 2022-09-16 DIAGNOSIS — Z888 Allergy status to other drugs, medicaments and biological substances status: Secondary | ICD-10-CM | POA: Diagnosis not present

## 2022-09-16 DIAGNOSIS — A419 Sepsis, unspecified organism: Secondary | ICD-10-CM

## 2022-09-16 DIAGNOSIS — F0390 Unspecified dementia without behavioral disturbance: Secondary | ICD-10-CM | POA: Diagnosis not present

## 2022-09-16 DIAGNOSIS — R627 Adult failure to thrive: Secondary | ICD-10-CM | POA: Diagnosis not present

## 2022-09-16 DIAGNOSIS — J181 Lobar pneumonia, unspecified organism: Secondary | ICD-10-CM | POA: Diagnosis not present

## 2022-09-16 DIAGNOSIS — K6389 Other specified diseases of intestine: Secondary | ICD-10-CM | POA: Diagnosis not present

## 2022-09-16 DIAGNOSIS — A4189 Other specified sepsis: Principal | ICD-10-CM | POA: Diagnosis present

## 2022-09-16 DIAGNOSIS — Z974 Presence of external hearing-aid: Secondary | ICD-10-CM

## 2022-09-16 DIAGNOSIS — Z66 Do not resuscitate: Secondary | ICD-10-CM | POA: Diagnosis present

## 2022-09-16 DIAGNOSIS — R1310 Dysphagia, unspecified: Secondary | ICD-10-CM | POA: Diagnosis present

## 2022-09-16 DIAGNOSIS — Z23 Encounter for immunization: Secondary | ICD-10-CM

## 2022-09-16 DIAGNOSIS — J9601 Acute respiratory failure with hypoxia: Secondary | ICD-10-CM | POA: Diagnosis present

## 2022-09-16 DIAGNOSIS — R1110 Vomiting, unspecified: Secondary | ICD-10-CM | POA: Diagnosis not present

## 2022-09-16 DIAGNOSIS — J90 Pleural effusion, not elsewhere classified: Secondary | ICD-10-CM | POA: Diagnosis not present

## 2022-09-16 DIAGNOSIS — M47816 Spondylosis without myelopathy or radiculopathy, lumbar region: Secondary | ICD-10-CM | POA: Diagnosis not present

## 2022-09-16 DIAGNOSIS — R1111 Vomiting without nausea: Secondary | ICD-10-CM | POA: Diagnosis not present

## 2022-09-16 DIAGNOSIS — Z8051 Family history of malignant neoplasm of kidney: Secondary | ICD-10-CM

## 2022-09-16 DIAGNOSIS — Z1152 Encounter for screening for COVID-19: Secondary | ICD-10-CM

## 2022-09-16 DIAGNOSIS — Z79899 Other long term (current) drug therapy: Secondary | ICD-10-CM | POA: Diagnosis not present

## 2022-09-16 DIAGNOSIS — R197 Diarrhea, unspecified: Secondary | ICD-10-CM | POA: Diagnosis not present

## 2022-09-16 DIAGNOSIS — H9190 Unspecified hearing loss, unspecified ear: Secondary | ICD-10-CM | POA: Diagnosis present

## 2022-09-16 DIAGNOSIS — I739 Peripheral vascular disease, unspecified: Secondary | ICD-10-CM | POA: Diagnosis present

## 2022-09-16 DIAGNOSIS — Z86711 Personal history of pulmonary embolism: Secondary | ICD-10-CM | POA: Diagnosis not present

## 2022-09-16 DIAGNOSIS — K3189 Other diseases of stomach and duodenum: Secondary | ICD-10-CM | POA: Diagnosis not present

## 2022-09-16 DIAGNOSIS — I10 Essential (primary) hypertension: Secondary | ICD-10-CM | POA: Diagnosis not present

## 2022-09-16 DIAGNOSIS — J439 Emphysema, unspecified: Secondary | ICD-10-CM | POA: Diagnosis not present

## 2022-09-16 DIAGNOSIS — J189 Pneumonia, unspecified organism: Principal | ICD-10-CM

## 2022-09-16 LAB — CBC WITH DIFFERENTIAL/PLATELET
Abs Immature Granulocytes: 0.03 10*3/uL (ref 0.00–0.07)
Basophils Absolute: 0 10*3/uL (ref 0.0–0.1)
Basophils Relative: 0 %
Eosinophils Absolute: 0 10*3/uL (ref 0.0–0.5)
Eosinophils Relative: 0 %
HCT: 43.2 % (ref 39.0–52.0)
Hemoglobin: 13.4 g/dL (ref 13.0–17.0)
Immature Granulocytes: 0 %
Lymphocytes Relative: 6 %
Lymphs Abs: 0.9 10*3/uL (ref 0.7–4.0)
MCH: 28.2 pg (ref 26.0–34.0)
MCHC: 31 g/dL (ref 30.0–36.0)
MCV: 90.9 fL (ref 80.0–100.0)
Monocytes Absolute: 0.6 10*3/uL (ref 0.1–1.0)
Monocytes Relative: 4 %
Neutro Abs: 12.7 10*3/uL — ABNORMAL HIGH (ref 1.7–7.7)
Neutrophils Relative %: 90 %
Platelets: 351 10*3/uL (ref 150–400)
RBC: 4.75 MIL/uL (ref 4.22–5.81)
RDW: 14.3 % (ref 11.5–15.5)
WBC: 14.2 10*3/uL — ABNORMAL HIGH (ref 4.0–10.5)
nRBC: 0 % (ref 0.0–0.2)

## 2022-09-16 LAB — COMPREHENSIVE METABOLIC PANEL
ALT: 14 U/L (ref 0–44)
AST: 22 U/L (ref 15–41)
Albumin: 3.9 g/dL (ref 3.5–5.0)
Alkaline Phosphatase: 75 U/L (ref 38–126)
Anion gap: 13 (ref 5–15)
BUN: 21 mg/dL (ref 8–23)
CO2: 20 mmol/L — ABNORMAL LOW (ref 22–32)
Calcium: 9.2 mg/dL (ref 8.9–10.3)
Chloride: 107 mmol/L (ref 98–111)
Creatinine, Ser: 1.2 mg/dL (ref 0.61–1.24)
GFR, Estimated: 57 mL/min — ABNORMAL LOW (ref >60)
Glucose, Bld: 185 mg/dL — ABNORMAL HIGH (ref 70–99)
Potassium: 3.9 mmol/L (ref 3.5–5.1)
Sodium: 140 mmol/L (ref 135–145)
Total Bilirubin: 0.7 mg/dL (ref 0.3–1.2)
Total Protein: 6.7 g/dL (ref 6.5–8.1)

## 2022-09-16 LAB — LACTIC ACID, PLASMA: Lactic Acid, Venous: 3.4 mmol/L (ref 0.5–1.9)

## 2022-09-16 LAB — URINALYSIS, ROUTINE W REFLEX MICROSCOPIC
Bacteria, UA: NONE SEEN
Bilirubin Urine: NEGATIVE
Glucose, UA: NEGATIVE mg/dL
Hgb urine dipstick: NEGATIVE
Ketones, ur: 5 mg/dL — AB
Leukocytes,Ua: NEGATIVE
Nitrite: NEGATIVE
Protein, ur: 100 mg/dL — AB
Specific Gravity, Urine: 1.024 (ref 1.005–1.030)
pH: 5 (ref 5.0–8.0)

## 2022-09-16 LAB — PROTIME-INR
INR: 1.3 — ABNORMAL HIGH (ref 0.8–1.2)
Prothrombin Time: 16.4 seconds — ABNORMAL HIGH (ref 11.4–15.2)

## 2022-09-16 LAB — APTT: aPTT: 29 seconds (ref 24–36)

## 2022-09-16 LAB — RESP PANEL BY RT-PCR (FLU A&B, COVID) ARPGX2
Influenza A by PCR: NEGATIVE
Influenza B by PCR: NEGATIVE
SARS Coronavirus 2 by RT PCR: NEGATIVE

## 2022-09-16 MED ORDER — VANCOMYCIN HCL 750 MG/150ML IV SOLN
750.0000 mg | INTRAVENOUS | Status: DC
  Administered 2022-09-18: 750 mg via INTRAVENOUS
  Filled 2022-09-16: qty 150

## 2022-09-16 MED ORDER — IOHEXOL 350 MG/ML SOLN
100.0000 mL | Freq: Once | INTRAVENOUS | Status: AC | PRN
  Administered 2022-09-16: 100 mL via INTRAVENOUS

## 2022-09-16 MED ORDER — METRONIDAZOLE 500 MG/100ML IV SOLN
500.0000 mg | Freq: Once | INTRAVENOUS | Status: AC
  Administered 2022-09-16: 500 mg via INTRAVENOUS
  Filled 2022-09-16: qty 100

## 2022-09-16 MED ORDER — INSULIN ASPART 100 UNIT/ML IJ SOLN: 0.0000 [IU] | Freq: Every day | INTRAMUSCULAR | Status: DC

## 2022-09-16 MED ORDER — APIXABAN 5 MG PO TABS
5.0000 mg | ORAL_TABLET | Freq: Two times a day (BID) | ORAL | Status: DC
  Administered 2022-09-17 – 2022-09-21 (×9): 5 mg via ORAL
  Filled 2022-09-16 (×10): qty 1

## 2022-09-16 MED ORDER — LACTATED RINGERS IV SOLN: INTRAVENOUS | Status: DC

## 2022-09-16 MED ORDER — ENOXAPARIN SODIUM 40 MG/0.4ML IJ SOSY: 40.0000 mg | PREFILLED_SYRINGE | INTRAMUSCULAR | Status: DC

## 2022-09-16 MED ORDER — SODIUM CHLORIDE 0.9 % IV BOLUS
1000.0000 mL | Freq: Once | INTRAVENOUS | Status: AC
  Administered 2022-09-16: 1000 mL via INTRAVENOUS

## 2022-09-16 MED ORDER — SACCHAROMYCES BOULARDII 250 MG PO CAPS
250.0000 mg | ORAL_CAPSULE | Freq: Two times a day (BID) | ORAL | Status: DC
  Administered 2022-09-17 – 2022-09-21 (×9): 250 mg via ORAL
  Filled 2022-09-16 (×13): qty 1

## 2022-09-16 MED ORDER — EZETIMIBE 10 MG PO TABS
10.0000 mg | ORAL_TABLET | Freq: Every day | ORAL | Status: DC
  Administered 2022-09-17 – 2022-09-21 (×5): 10 mg via ORAL
  Filled 2022-09-16 (×5): qty 1

## 2022-09-16 MED ORDER — SODIUM CHLORIDE 0.9 % IV SOLN
2.0000 g | Freq: Once | INTRAVENOUS | Status: AC
  Administered 2022-09-16: 2 g via INTRAVENOUS
  Filled 2022-09-16: qty 12.5

## 2022-09-16 MED ORDER — VANCOMYCIN HCL 125 MG PO CAPS
125.0000 mg | ORAL_CAPSULE | Freq: Every day | ORAL | Status: DC
  Administered 2022-09-17: 125 mg via ORAL
  Filled 2022-09-16 (×4): qty 1

## 2022-09-16 MED ORDER — ESCITALOPRAM OXALATE 10 MG PO TABS
20.0000 mg | ORAL_TABLET | Freq: Every day | ORAL | Status: DC
  Administered 2022-09-17 – 2022-09-21 (×5): 20 mg via ORAL
  Filled 2022-09-16 (×5): qty 2

## 2022-09-16 MED ORDER — SODIUM CHLORIDE 0.9 % IV SOLN
2.0000 g | Freq: Two times a day (BID) | INTRAVENOUS | Status: DC
  Administered 2022-09-17 – 2022-09-18 (×3): 2 g via INTRAVENOUS
  Filled 2022-09-16 (×3): qty 12.5

## 2022-09-16 MED ORDER — VANCOMYCIN HCL IN DEXTROSE 1-5 GM/200ML-% IV SOLN
1000.0000 mg | Freq: Once | INTRAVENOUS | Status: AC
  Administered 2022-09-16: 1000 mg via INTRAVENOUS
  Filled 2022-09-16: qty 200

## 2022-09-16 MED ORDER — INSULIN ASPART 100 UNIT/ML IJ SOLN
0.0000 [IU] | Freq: Three times a day (TID) | INTRAMUSCULAR | Status: DC
  Administered 2022-09-17 – 2022-09-19 (×2): 1 [IU] via SUBCUTANEOUS
  Administered 2022-09-20: 2 [IU] via SUBCUTANEOUS

## 2022-09-16 NOTE — Progress Notes (Addendum)
Pharmacy Antibiotic Note  Paul [name] is a 88 y.o. male admitted on 09/16/2022 with sepsis.  Pharmacy has been consulted for vanc and cefepime dosing.  Plan: Vanc 1 gm q24h (expected AUC 563.2, Ke 0.037), and cefepime 2 gm q12h . Order Vanc level in a couple days to assess drug clearance. F/u cultures. Pharmacy to follow  Weight: 66.2 kg (146 lb)  Temp (24hrs), Avg:100.2 F (37.9 C), Min:100.2 F (37.9 C), Max:100.2 F (37.9 C)  Recent Labs  Lab 09/16/22 2055  WBC 14.2*  CREATININE 1.20  LATICACIDVEN 3.4*    Estimated Creatinine Clearance: 38.3 mL/min (by C-G formula based on SCr of 1.2 mg/dL).    Allergies  Allergen Reactions   Livalo [Pitavastatin] Other (See Comments)    Cramping and back pain   Haldol [Haloperidol] Other (See Comments)    Family does not like his affect when he is given this - makes him a "zombie."   Other Diarrhea and Other (See Comments)    Unnamed patch and tablets for dementia caused diarrhea (Possibly galantamine, from outside source)     Antimicrobials this admission: Vanc  10/3 >> Cefepime 10/3 >>   Dose adjustments this admission: N/a  Microbiology results: 10/3 BCx: pending 10/3 UCx: pending  10/3 Resp panel: pending  Thank you for allowing pharmacy to be a part of this patient's care.  Narda Amber 09/16/2022 9:57 PM

## 2022-09-16 NOTE — ED Provider Notes (Signed)
[facility] Provider Note   CSN: [id number] Arrival date & time: 09/16/22  2030     History {Add pertinent medical, surgical, social history, OB history to HPI:1} Chief Complaint  Patient presents with   Emesis / Diarrhea    Sepsis    Paul [name] is a 88 y.o. male, history of dementia, CHF, gastric ulcer, PE, bacteremia, who presents to the ED secondary to a fever, diarrhea, vomiting for the last day.  Daughter at bedside states that he has had 3 episodes of this, this is his third episode, the 2 other episodes resulted in hospitalization.  He is demented, and is a DNR.  She states that he had just been treated for C. difficile and finished his course of oral vancomycin about a week ago.  Continue to have some Paul [name] liquid stools, but then developed a fever, and then vomiting brown liquid today.     Home Medications Prior to Admission medications   Medication Sig Start Date End Date Taking? Authorizing Provider  acetaminophen (TYLENOL) 500 MG tablet Take 500-1,000 mg by mouth daily as needed for mild pain or headache.    [provider]  apixaban (ELIQUIS) 5 MG TABS tablet Take 2 tablets ('10mg'$ ) twice daily for 7 days, then 1 tablet ('5mg'$ ) twice daily 07/31/22   Alma Friendly, MD  donepezil (ARICEPT) 5 MG tablet Take 5 mg by mouth daily.    [provider]  Ensure (ENSURE) Take 237 mLs by mouth daily.    [provider]  escitalopram (LEXAPRO) 20 MG tablet Take 20 mg by mouth daily. 01/24/20   [provider]  ezetimibe (ZETIA) 10 MG tablet Take 10 mg by mouth daily.    [provider]  isosorbide mononitrate (IMDUR) 60 MG 24 hr tablet Take 1 tablet (60 mg total) by mouth every morning. do not give if systolic blood pressure < 130 Patient taking differently: Take 60 mg by mouth daily. 02/01/22   Patrecia Pour, MD  Probiotic Product (PROBIOTIC PO) Take 1 tablet by mouth daily.    [provider]  vancomycin (VANCOCIN) 125 MG capsule Take 125 mg by mouth every other day. 07/04/22   [provider]  simvastatin (ZOCOR) 20 MG tablet Take 20 mg by mouth every evening.  02/27/12  [provider]      Allergies    Livalo [pitavastatin], Haldol [haloperidol], and Other    Review of Systems   Review of Systems  Physical Exam Updated Vital Signs BP (!) 147/80   Pulse (!) 107   Temp 100.2 F (37.9 C) (Oral)   Resp 18   SpO2 (!) 82%  Physical Exam Constitutional:      General: He is in acute distress.     Appearance: He is ill-appearing.  HENT:     Head: Normocephalic.     Nose: Nose normal.     Mouth/Throat:     Mouth: Mucous membranes are dry.  Cardiovascular:     Rate and Rhythm: Regular rhythm. Tachycardia present.  Pulmonary:     Effort: Tachypnea present.     Breath sounds: Decreased air movement present.     Comments: +nonrebreather Abdominal:     Tenderness: There is generalized abdominal tenderness.  Skin:    Coloration: Skin is ashen.  Psychiatric:        Cognition and Memory: Cognition is impaired.     ED Results / Procedures / Treatments   Labs (all labs  ordered are listed, but only abnormal results are displayed) Labs Reviewed  CBC WITH DIFFERENTIAL/PLATELET - Abnormal; Notable for the following components:      Result Value   WBC 14.2 (*)    Neutro Abs 12.7 (*)    All other components within normal limits  RESP PANEL BY RT-PCR (FLU A&B, COVID) ARPGX2  CULTURE, BLOOD (ROUTINE X 2)  CULTURE, BLOOD (ROUTINE X 2)  URINE CULTURE  COMPREHENSIVE METABOLIC PANEL  URINALYSIS, ROUTINE W REFLEX MICROSCOPIC  LACTIC ACID, PLASMA  LACTIC ACID, PLASMA  PROTIME-INR  APTT    EKG None  Radiology DG Chest Port 1 View  Result Date: 09/16/2022 CLINICAL DATA:  Vomiting, diarrhea. EXAM: PORTABLE CHEST 1 VIEW COMPARISON:  July 29, 2022. FINDINGS: The heart size and mediastinal contours are within normal limits. Mild right  basilar atelectasis or scarring is noted. Stable left pleural effusion is noted with associated left lower lobe pneumonia or atelectasis. The visualized skeletal structures are unremarkable. IMPRESSION: Stable left pleural effusion is noted with associated left lower lobe pneumonia or atelectasis. Mild right basilar atelectasis or scarring is noted. Electronically Signed   By: Marijo Conception M.D.   On: 09/16/2022 21:24    Procedures Procedures  {Document cardiac monitor, telemetry assessment procedure when appropriate:1}  Medications Ordered in ED Medications - No data to display  ED Course/ Medical Decision Making/ A&P                           Medical Decision Making  ***  {Document critical care time when appropriate:1} {Document review of labs and clinical decision tools ie heart score, Chads2Vasc2 etc:1}  {Document your independent review of radiology images, and any outside records:1} {Document your discussion with family members, caretakers, and with consultants:1} {Document social determinants of health affecting pt's care:1} {Document your decision making why or why not admission, treatments were needed:1} Final Clinical Impression(s) / ED Diagnoses Final diagnoses:  None    Rx / DC Orders ED Discharge Orders     None

## 2022-09-16 NOTE — ED Provider Triage Note (Addendum)
Emergency Medicine Provider Triage Evaluation Note  Paul [name] , a 88 y.o. male  was evaluated in triage.  Pt complains of emesis and diarrhea.  Started earlier today according to patient's family member.  Patient has been in the hospital only, has a history of C. difficile which was treated he is currently on vancomycin at home per family.  Admitted for PE recently.  He has some short-term memory loss but at baseline he is communicative.  Review of Systems  Per HPI  Physical Exam  There were no vitals taken for this visit. Gen:   Awake, ill-appearing Resp:  Normal effort  MSK:   Moves extremities without difficulty  Other:  Patient is not communicative, participatory in exam.  Covering sputum, just not wince when I press on his abdomen.  Medical Decision Making  Medically screening exam initiated at 8:39 PM.  Appropriate orders placed.  Paul [name] was informed that the remainder of the evaluation will be completed by another provider, this initial triage assessment does not replace that evaluation, and the importance of remaining in the ED until their evaluation is complete.     Needs room, charge aware.   Sherrill Raring, PA-C 09/16/22 2051

## 2022-09-16 NOTE — ED Notes (Addendum)
Pt room air sats at 81% RA, placed on 15L [place].    Sats are now at 96%.

## 2022-09-16 NOTE — ED Triage Notes (Signed)
Patient arrived with EMS from home reports multiple episodes of emesis and diarrhea for several days with generalized weakness/fatigue . No fever or chills . Received Zofran 4 mg IV by EMS prior to arrival .

## 2022-09-16 NOTE — H&P (Signed)
History and Physical  Paul [name] ZOX:[id number] DOB: 11-Sep-1930 DOA: 09/16/2022  Referring physician: Dolphus Jenny, PA-EDP  PCP: Rodrigo Ran, MD  Outpatient Specialists: Cardiology, neurology, palliative care. Patient coming from: Home, lives with wife.  Chief Complaint: Vomiting and diarrhea   HPI: Paul [name] is a 88 y.o. male with medical history significant for dementia, peripheral artery disease, history of bladder cancer, hypertension, hyperlipidemia, CVA, recent C. difficile infection June 20, 2022, history of pulmonary embolism on Eliquis, who presented to [facility] ED from home due to vomiting and diarrhea.  Completed his course of p.o. vancomycin about a week ago, then started having recurrent diarrhea.  He is in the room accompanied by his daughter.  Has had subjective fevers at home and chills.  His daughter also endorses dysphagia, intermittently worse with liquids.  In the ED, had large emesis of brown liquid.  Hypoxic with O2 saturation 81% on room air, was placed on 15 L HFNC and wean down to 4 L nasal cannula.  Not on O2 supplementation at baseline.  Febrile with Tmax 101.7.  COVID-19 screening test negative.  CT abdomen pelvis with contrast with no evidence of obstruction.  CT angio PE no evidence of pulmonary embolism.  However showing bilateral lower lobe infiltrates left greater than right, suggestive of pneumonia.  Chronic left pleural effusion.  ED Course: Tmax 101.7.  BP 166/86, pulse 98, respiration rate 20, O2 saturation 98% on 4 L.  Lab studies remarkable for serum bicarb 20, glucose 185, creatinine 1.20 with GFR 57.  Baseline creatinine 0.9 with GFR greater than 60.  WBC 14.2.  Review of Systems: Review of systems as noted in the HPI. All other systems reviewed and are negative.   Past Medical History:  Diagnosis Date   Back pain    Bladder tumor    BPH (benign prostatic hyperplasia)    Clostridioides difficile infection 06/10/2022   Hyperlipidemia    Hypertension     Impaired hearing BILATERAL HEARING AIDS   Personal history of gastric ulcer    Pneumonia 06/10/2022   Past Surgical History:  Procedure Laterality Date   APPENDECTOMY  1957   CATARACT EXTRACTION W/ INTRAOCULAR LENS  IMPLANT, BILATERAL     CYSTOSCOPY WITH BIOPSY  03/08/2012   Procedure: CYSTOSCOPY WITH BIOPSY;  Surgeon: Garnett Farm, MD;  Location: [facility];  Service: Urology;  Laterality: N/A;  gyrus   TONSILLECTOMY  CHILD   TRANSURETHRAL INCISION OF PROSTATE  03/08/2012   Procedure: TRANSURETHRAL INCISION OF THE PROSTATE (TUIP);  Surgeon: Garnett Farm, MD;  Location: [facility];  Service: Urology;  Laterality: N/A;   TRANSURETHRAL RESECTION OF BLADDER TUMOR  01/26/2012   Procedure: TRANSURETHRAL RESECTION OF BLADDER TUMOR (TURBT);  Surgeon: Garnett Farm, MD;  Location: [facility];  Service: Urology;  Laterality: N/A;  GYRUS MYTOMICIN C     Social History:  reports that he quit smoking about 41 years ago. His smoking use included cigarettes. He has never used smokeless tobacco. He reports that he does not drink alcohol and does not use drugs.   Allergies  Allergen Reactions   Livalo [Pitavastatin] Other (See Comments)    Cramping and back pain   Haldol [Haloperidol] Other (See Comments)    Family does not like his affect when he is given this - makes him a "zombie."   Other Diarrhea and Other (See Comments)    Unnamed patch and tablets for dementia caused diarrhea (Possibly galantamine, from outside  source)     Family History  Problem Relation Age of Onset   Kidney cancer Mother    Hypertension Father       Prior to Admission medications   Medication Sig Start Date End Date Taking? Authorizing Provider  acetaminophen (TYLENOL) 500 MG tablet Take 500-1,000 mg by mouth daily as needed for mild pain or headache.   Yes [provider]  apixaban (ELIQUIS) 5 MG TABS tablet Take 2 tablets (10mg ) twice daily for 7  days, then 1 tablet (5mg ) twice daily Patient taking differently: Take 5 mg by mouth 2 (two) times daily. 07/31/22  Yes Briant Cedar, MD  donepezil (ARICEPT) 5 MG tablet Take 5 mg by mouth daily.   Yes [provider]  Ensure (ENSURE) Take 237 mLs by mouth daily.   Yes [provider]  escitalopram (LEXAPRO) 20 MG tablet Take 20 mg by mouth daily. 01/24/20  Yes [provider]  ezetimibe (ZETIA) 10 MG tablet Take 10 mg by mouth daily.   Yes [provider]  isosorbide mononitrate (IMDUR) 60 MG 24 hr tablet Take 1 tablet (60 mg total) by mouth every morning. do not give if systolic blood pressure < 130 Patient taking differently: Take 60 mg by mouth daily. 02/01/22  Yes Tyrone Nine, MD  loperamide (IMODIUM) 2 MG capsule Take 2 mg by mouth once.   Yes [provider]  Probiotic Product (PROBIOTIC PO) Take 1 tablet by mouth daily.   Yes [provider]  vancomycin (VANCOCIN) 125 MG capsule Take 125 mg by mouth every other day. Patient not taking: Reported on 09/16/2022 07/04/22   [provider]  simvastatin (ZOCOR) 20 MG tablet Take 20 mg by mouth every evening.  02/27/12  [provider]    Physical Exam: BP (!) 155/95   Pulse 99   Temp (!) 101.7 F (38.7 C) (Rectal)   Resp 18   Wt 66.2 kg   SpO2 100%   BMI 22.20 kg/m   General: 88 y.o. year-old male well developed well nourished in no acute distress.  Somnolent but arousable to voices. Cardiovascular: Regular rate and rhythm with no rubs or gallops.  No thyromegaly or JVD noted.  No lower extremity edema. 2/4 pulses in all 4 extremities. Respiratory: Mild rales at bases.  No wheezing noted.  Poor inspiratory effort. Abdomen: Soft nontender nondistended with normal bowel sounds x4 quadrants. Muskuloskeletal: No cyanosis, clubbing or edema noted bilaterally Neuro: CN II-XII intact, strength, sensation, reflexes Skin: No ulcerative lesions noted or  rashes Psychiatry: Judgement and insight appear altered. Mood is appropriate for condition and setting          Labs on Admission:  Basic Metabolic Panel: Recent Labs  Lab 09/16/22 2055  NA 140  K 3.9  CL 107  CO2 20*  GLUCOSE 185*  BUN 21  CREATININE 1.20  CALCIUM 9.2   Liver Function Tests: Recent Labs  Lab 09/16/22 2055  AST 22  ALT 14  ALKPHOS 75  BILITOT 0.7  PROT 6.7  ALBUMIN 3.9   No results for input(s): "LIPASE", "AMYLASE" in the last 168 hours. No results for input(s): "AMMONIA" in the last 168 hours. CBC: Recent Labs  Lab 09/16/22 2055  WBC 14.2*  NEUTROABS 12.7*  HGB 13.4  HCT 43.2  MCV 90.9  PLT 351   Cardiac Enzymes: No results for input(s): "CKTOTAL", "CKMB", "CKMBINDEX", "TROPONINI" in the last 168 hours.  BNP (last 3 results) Recent Labs  06/10/22 1113 07/29/22 1857  BNP 167.0* 31.0    ProBNP (last 3 results) No results for input(s): "PROBNP" in the last 8760 hours.  CBG: No results for input(s): "GLUCAP" in the last 168 hours.  Radiological Exams on Admission: CT Angio Chest Pulmonary Embolism (PE) W or WO Contrast  Result Date: 09/16/2022 CLINICAL DATA:  Vomiting, diarrhea, fever, recent C diff, history of PE EXAM: CT ANGIOGRAPHY CHEST CT ABDOMEN AND PELVIS WITH CONTRAST TECHNIQUE: Multidetector CT imaging of the chest was performed using the standard protocol during bolus administration of intravenous contrast. Multiplanar CT image reconstructions and MIPs were obtained to evaluate the vascular anatomy. Multidetector CT imaging of the abdomen and pelvis was performed using the standard protocol during bolus administration of intravenous contrast. RADIATION DOSE REDUCTION: This exam was performed according to the departmental dose-optimization program which includes automated exposure control, adjustment of the mA and/or kV according to patient size and/or use of iterative reconstruction technique. CONTRAST:  OMNIPAQUE IOHEXOL  350 MG/ML SOLN COMPARISON:  CTA chest dated 07/29/2022. CT chest abdomen pelvis dated 06/20/2022. FINDINGS: CTA CHEST FINDINGS Cardiovascular: Satisfactory opacification the bilateral pulmonary arteries to the segmental level. No evidence of pulmonary embolism. Specifically, the prior segmental left lower lobe pulmonary embolism has resolved. Although not tailored for evaluation of the thoracic aorta, there is no evidence thoracic aortic aneurysm or dissection. Atherosclerotic calcifications of the arch. The heart is normal in size.  No pericardial effusion. Mild coronary sclerosis of the LAD and right coronary artery. Mediastinum/Nodes: No suspicious mediastinal lymphadenopathy. Visualized thyroid is unremarkable. Lungs/Pleura: Rounded atelectasis/collapse in the left lower lobe. Associated moderate chronic left pleural effusion with pleural thickening, similar to the prior. Mild patchy right lower lobe opacity, likely atelectasis. New patchy/nodular opacities in the posterior left upper lobe (series 7/image 37), suggesting mild infection/pneumonia. Evaluation lung parenchyma is constrained by respiratory motion. Within that constraint, there are no suspicious pulmonary nodules. Mild centrilobular emphysematous changes, upper lobe predominant. No pneumothorax. Musculoskeletal: Moderate degenerative changes of the thoracic spine. Review of the MIP images confirms the above findings. CT ABDOMEN and PELVIS FINDINGS Motion degraded images. Hepatobiliary: 10 mm cyst versus hemangioma in the central right liver (series 3/image 26), benign. Gallbladder is unremarkable. No intrahepatic or extrahepatic duct dilatation. Pancreas: Within normal limits. Spleen: Within normal limits. Adrenals/Urinary Tract: Adrenal glands are within normal limits. Right renal scarring. Left kidney is within normal limits. No hydronephrosis. Bladder is mildly thick-walled although underdistended. Stomach/Bowel: Stomach is within normal limits.  No evidence of bowel obstruction. Appendix is not discretely visualized. Sigmoid diverticulosis, without evidence of diverticulitis. No colonic wall thickening or inflammatory changes in this patient with recent history of C diff. Vascular/Lymphatic: No evidence of abdominal aortic aneurysm. Atherosclerotic calcifications of the abdominal aorta and branch vessels. No suspicious abdominopelvic lymphadenopathy. Reproductive: Prostatomegaly, suggesting BPH. Other: No abdominopelvic ascites. Tiny fat containing left inguinal hernia. Musculoskeletal: Moderate degenerative changes of the lumbar spine. Review of the MIP images confirms the above findings. IMPRESSION: No evidence of pulmonary embolism. Specifically, the prior segmental left lower lobe pulmonary embolism has resolved. New patchy/nodular opacities in the posterior left upper lobe, suggesting mild infection/pneumonia. Rounded atelectasis/collapse in the left lower lobe. Associated moderate chronic left pleural effusion with pleural thickening. Both findings are chronic. No colonic wall thickening or inflammatory changes in this patient with recent history of C diff. Sigmoid diverticulosis, without evidence of diverticulitis. Additional stable ancillary findings as above. Aortic Atherosclerosis (ICD10-I70.0) and Emphysema (ICD10-J43.9). Electronically Signed   By: Lurlean Horns  Rito Ehrlich M.D.   On: 09/16/2022 23:01   CT ABDOMEN PELVIS W CONTRAST  Result Date: 09/16/2022 CLINICAL DATA:  Vomiting, diarrhea, fever, recent C diff, history of PE EXAM: CT ANGIOGRAPHY CHEST CT ABDOMEN AND PELVIS WITH CONTRAST TECHNIQUE: Multidetector CT imaging of the chest was performed using the standard protocol during bolus administration of intravenous contrast. Multiplanar CT image reconstructions and MIPs were obtained to evaluate the vascular anatomy. Multidetector CT imaging of the abdomen and pelvis was performed using the standard protocol during bolus administration of  intravenous contrast. RADIATION DOSE REDUCTION: This exam was performed according to the departmental dose-optimization program which includes automated exposure control, adjustment of the mA and/or kV according to patient size and/or use of iterative reconstruction technique. CONTRAST:  OMNIPAQUE IOHEXOL 350 MG/ML SOLN COMPARISON:  CTA chest dated 07/29/2022. CT chest abdomen pelvis dated 06/20/2022. FINDINGS: CTA CHEST FINDINGS Cardiovascular: Satisfactory opacification the bilateral pulmonary arteries to the segmental level. No evidence of pulmonary embolism. Specifically, the prior segmental left lower lobe pulmonary embolism has resolved. Although not tailored for evaluation of the thoracic aorta, there is no evidence thoracic aortic aneurysm or dissection. Atherosclerotic calcifications of the arch. The heart is normal in size.  No pericardial effusion. Mild coronary sclerosis of the LAD and right coronary artery. Mediastinum/Nodes: No suspicious mediastinal lymphadenopathy. Visualized thyroid is unremarkable. Lungs/Pleura: Rounded atelectasis/collapse in the left lower lobe. Associated moderate chronic left pleural effusion with pleural thickening, similar to the prior. Mild patchy right lower lobe opacity, likely atelectasis. New patchy/nodular opacities in the posterior left upper lobe (series 7/image 37), suggesting mild infection/pneumonia. Evaluation lung parenchyma is constrained by respiratory motion. Within that constraint, there are no suspicious pulmonary nodules. Mild centrilobular emphysematous changes, upper lobe predominant. No pneumothorax. Musculoskeletal: Moderate degenerative changes of the thoracic spine. Review of the MIP images confirms the above findings. CT ABDOMEN and PELVIS FINDINGS Motion degraded images. Hepatobiliary: 10 mm cyst versus hemangioma in the central right liver (series 3/image 26), benign. Gallbladder is unremarkable. No intrahepatic or extrahepatic duct  dilatation. Pancreas: Within normal limits. Spleen: Within normal limits. Adrenals/Urinary Tract: Adrenal glands are within normal limits. Right renal scarring. Left kidney is within normal limits. No hydronephrosis. Bladder is mildly thick-walled although underdistended. Stomach/Bowel: Stomach is within normal limits. No evidence of bowel obstruction. Appendix is not discretely visualized. Sigmoid diverticulosis, without evidence of diverticulitis. No colonic wall thickening or inflammatory changes in this patient with recent history of C diff. Vascular/Lymphatic: No evidence of abdominal aortic aneurysm. Atherosclerotic calcifications of the abdominal aorta and branch vessels. No suspicious abdominopelvic lymphadenopathy. Reproductive: Prostatomegaly, suggesting BPH. Other: No abdominopelvic ascites. Tiny fat containing left inguinal hernia. Musculoskeletal: Moderate degenerative changes of the lumbar spine. Review of the MIP images confirms the above findings. IMPRESSION: No evidence of pulmonary embolism. Specifically, the prior segmental left lower lobe pulmonary embolism has resolved. New patchy/nodular opacities in the posterior left upper lobe, suggesting mild infection/pneumonia. Rounded atelectasis/collapse in the left lower lobe. Associated moderate chronic left pleural effusion with pleural thickening. Both findings are chronic. No colonic wall thickening or inflammatory changes in this patient with recent history of C diff. Sigmoid diverticulosis, without evidence of diverticulitis. Additional stable ancillary findings as above. Aortic Atherosclerosis (ICD10-I70.0) and Emphysema (ICD10-J43.9). Electronically Signed   By: Charline Bills M.D.   On: 09/16/2022 23:01   DG Abd 2 Views  Result Date: 09/16/2022 CLINICAL DATA:  Nausea and vomiting EXAM: ABDOMEN - 2 VIEW COMPARISON:  None Available. FINDINGS: There is a large air-fluid  level in the stomach. No other dilated bowel loops are seen. Air seen  to the level the rectum. There are phleboliths in the pelvis. There is a small left pleural effusion. Degenerative changes affect the spine. IMPRESSION: There is a large air-fluid level in the stomach. No other dilated bowel loops are seen. Electronically Signed   By: Darliss Cheney M.D.   On: 09/16/2022 22:10   DG Chest Port 1 View  Result Date: 09/16/2022 CLINICAL DATA:  Vomiting, diarrhea. EXAM: PORTABLE CHEST 1 VIEW COMPARISON:  July 29, 2022. FINDINGS: The heart size and mediastinal contours are within normal limits. Mild right basilar atelectasis or scarring is noted. Stable left pleural effusion is noted with associated left lower lobe pneumonia or atelectasis. The visualized skeletal structures are unremarkable. IMPRESSION: Stable left pleural effusion is noted with associated left lower lobe pneumonia or atelectasis. Mild right basilar atelectasis or scarring is noted. Electronically Signed   By: Lupita Raider M.D.   On: 09/16/2022 21:24    EKG: I independently viewed the EKG done and my findings are as followed: Sinus tachycardia rate of 103.  Nonspecific ST-T changes.  QTc 417.  Assessment/Plan Present on Admission:  Acute respiratory failure with hypoxia (HCC)  Principal Problem:   Acute respiratory failure with hypoxia (HCC)  Acute hypoxic respiratory failure suspect multifactorial secondary to pneumonia, left pleural effusion. Not on oxygen supplementation at baseline Currently requiring 4 L to maintain O2 saturation greater than 92% Wean off oxygen supplementation as tolerated. Incentive spirometer. Mobilize as tolerated.  Intractable nausea and diarrhea Recent C. difficile infection Check C. difficile PCR Start p.o. vancomycin 125 mg daily. Start Florastor 250 mg twice daily If C. difficile is negative start Imodium as needed. IV antiemetics as needed Aspiration precautions.  Dysphagia, worsened with liquids Speech therapist consulted to evaluate Aspiration  precautions.  Recent history of C. difficile infection Still with diarrhea P.o. vancomycin 125 mg daily. P.o. Florastor 250 mg twice daily. IV fluid hydration Electrolyte repletion  Generalized weakness PT OT to assess Fall precautions  History of PE on Eliquis Resume home Eliquis    Critical care time: 65 minutes.    DVT prophylaxis: Eliquis  Code Status: Full code  Family Communication: Updated the patient's daughter at bedside  Disposition Plan: Admitted to progressive care unit  Consults called: None.  Admission status: Inpatient status.   Status is: Inpatient The patient requires at least 2 midnights for further evaluation and treatment of present condition.   Darlin Drop MD [facility] Pager [phone]  If 7PM-7AM, please contact night-coverage [email or web address] Password [email or web address]  09/16/2022, 11:58 PM

## 2022-09-16 NOTE — Sepsis Progress Note (Signed)
Elink monitoring Code Sepsis

## 2022-09-17 DIAGNOSIS — J189 Pneumonia, unspecified organism: Secondary | ICD-10-CM | POA: Diagnosis not present

## 2022-09-17 DIAGNOSIS — J9601 Acute respiratory failure with hypoxia: Secondary | ICD-10-CM | POA: Diagnosis not present

## 2022-09-17 LAB — CBC WITH DIFFERENTIAL/PLATELET
Abs Immature Granulocytes: 0.03 10*3/uL (ref 0.00–0.07)
Basophils Absolute: 0 10*3/uL (ref 0.0–0.1)
Basophils Relative: 0 %
Eosinophils Absolute: 0 10*3/uL (ref 0.0–0.5)
Eosinophils Relative: 0 %
HCT: 39.3 % (ref 39.0–52.0)
Hemoglobin: 12.4 g/dL — ABNORMAL LOW (ref 13.0–17.0)
Immature Granulocytes: 0 %
Lymphocytes Relative: 4 %
Lymphs Abs: 0.3 10*3/uL — ABNORMAL LOW (ref 0.7–4.0)
MCH: 28.4 pg (ref 26.0–34.0)
MCHC: 31.6 g/dL (ref 30.0–36.0)
MCV: 89.9 fL (ref 80.0–100.0)
Monocytes Absolute: 0.6 10*3/uL (ref 0.1–1.0)
Monocytes Relative: 7 %
Neutro Abs: 6.6 10*3/uL (ref 1.7–7.7)
Neutrophils Relative %: 89 %
Platelets: 253 10*3/uL (ref 150–400)
RBC: 4.37 MIL/uL (ref 4.22–5.81)
RDW: 14.3 % (ref 11.5–15.5)
WBC: 7.5 10*3/uL (ref 4.0–10.5)
nRBC: 0 % (ref 0.0–0.2)

## 2022-09-17 LAB — GLUCOSE, CAPILLARY
Glucose-Capillary: 94 mg/dL (ref 70–99)
Glucose-Capillary: 82 mg/dL (ref 70–99)

## 2022-09-17 LAB — CLOSTRIDIUM DIFFICILE BY PCR, REFLEXED: Toxigenic C. Difficile by PCR: POSITIVE — AB

## 2022-09-17 LAB — C DIFFICILE QUICK SCREEN W PCR REFLEX
C Diff antigen: POSITIVE — AB
C Diff toxin: NEGATIVE

## 2022-09-17 LAB — URINE CULTURE

## 2022-09-17 LAB — COMPREHENSIVE METABOLIC PANEL
ALT: 14 U/L (ref 0–44)
AST: 23 U/L (ref 15–41)
Albumin: 3.2 g/dL — ABNORMAL LOW (ref 3.5–5.0)
Alkaline Phosphatase: 58 U/L (ref 38–126)
Anion gap: 7 (ref 5–15)
BUN: 19 mg/dL (ref 8–23)
CO2: 22 mmol/L (ref 22–32)
Calcium: 8.5 mg/dL — ABNORMAL LOW (ref 8.9–10.3)
Chloride: 107 mmol/L (ref 98–111)
Creatinine, Ser: 1.27 mg/dL — ABNORMAL HIGH (ref 0.61–1.24)
GFR, Estimated: 54 mL/min — ABNORMAL LOW (ref >60)
Glucose, Bld: 135 mg/dL — ABNORMAL HIGH (ref 70–99)
Potassium: 4.1 mmol/L (ref 3.5–5.1)
Sodium: 136 mmol/L (ref 135–145)
Total Bilirubin: 0.6 mg/dL (ref 0.3–1.2)
Total Protein: 5.7 g/dL — ABNORMAL LOW (ref 6.5–8.1)

## 2022-09-17 LAB — CBG MONITORING, ED
Glucose-Capillary: 119 mg/dL — ABNORMAL HIGH (ref 70–99)
Glucose-Capillary: 111 mg/dL — ABNORMAL HIGH (ref 70–99)
Glucose-Capillary: 122 mg/dL — ABNORMAL HIGH (ref 70–99)

## 2022-09-17 LAB — PHOSPHORUS: Phosphorus: 1.7 mg/dL — ABNORMAL LOW (ref 2.5–4.6)

## 2022-09-17 LAB — HEMOGLOBIN A1C
Hgb A1c MFr Bld: 5.2 % (ref 4.8–5.6)
Mean Plasma Glucose: 102.54 mg/dL

## 2022-09-17 LAB — LACTIC ACID, PLASMA
Lactic Acid, Venous: 2.9 mmol/L (ref 0.5–1.9)
Lactic Acid, Venous: 3 mmol/L (ref 0.5–1.9)

## 2022-09-17 LAB — MAGNESIUM: Magnesium: 1.7 mg/dL (ref 1.7–2.4)

## 2022-09-17 MED ORDER — ONDANSETRON HCL 4 MG/2ML IJ SOLN
4.0000 mg | Freq: Four times a day (QID) | INTRAMUSCULAR | Status: DC | PRN
  Administered 2022-09-17: 4 mg via INTRAVENOUS
  Filled 2022-09-17: qty 2

## 2022-09-17 MED ORDER — LACTATED RINGERS IV SOLN: INTRAVENOUS | Status: AC

## 2022-09-17 MED ORDER — SODIUM CHLORIDE 0.9 % IV SOLN: INTRAVENOUS | Status: DC | PRN

## 2022-09-17 MED ORDER — ACETAMINOPHEN 325 MG PO TABS
650.0000 mg | ORAL_TABLET | Freq: Four times a day (QID) | ORAL | Status: DC | PRN
  Administered 2022-09-19 – 2022-09-21 (×2): 650 mg via ORAL
  Filled 2022-09-17 (×2): qty 2

## 2022-09-17 MED ORDER — DONEPEZIL HCL 10 MG PO TABS
5.0000 mg | ORAL_TABLET | Freq: Every day | ORAL | Status: DC
  Administered 2022-09-17 – 2022-09-21 (×5): 5 mg via ORAL
  Filled 2022-09-17 (×5): qty 1

## 2022-09-17 MED ORDER — ENSURE ENLIVE PO LIQD
237.0000 mL | Freq: Two times a day (BID) | ORAL | Status: DC
  Administered 2022-09-17: 237 mL via ORAL

## 2022-09-17 MED ORDER — INFLUENZA VAC A&B SA ADJ QUAD 0.5 ML IM PRSY
0.5000 mL | PREFILLED_SYRINGE | INTRAMUSCULAR | Status: AC
  Administered 2022-09-21: 0.5 mL via INTRAMUSCULAR
  Filled 2022-09-17: qty 0.5

## 2022-09-17 MED ORDER — POTASSIUM PHOSPHATES 15 MMOLE/5ML IV SOLN
30.0000 mmol | Freq: Once | INTRAVENOUS | Status: AC
  Administered 2022-09-17: 30 mmol via INTRAVENOUS
  Filled 2022-09-17: qty 10

## 2022-09-17 NOTE — Evaluation (Signed)
Clinical/Bedside Swallow Evaluation Patient Details  Name: Paul [name] MRN: [id number] Date of Birth: 10/27/1930  Today's Date: 09/17/2022 Time: SLP Start Time (ACUTE ONLY): 0902 SLP Stop Time (ACUTE ONLY): 1962 SLP Time Calculation (min) (ACUTE ONLY): 19 min  Past Medical History:  Past Medical History:  Diagnosis Date   Back pain    Bladder tumor    BPH (benign prostatic hyperplasia)    Clostridioides difficile infection 06/10/2022   Hyperlipidemia    Hypertension    Impaired hearing BILATERAL HEARING AIDS   Personal history of gastric ulcer    Pneumonia 06/10/2022   Past Surgical History:  Past Surgical History:  Procedure Laterality Date   APPENDECTOMY  1957   CATARACT EXTRACTION W/ INTRAOCULAR LENS  IMPLANT, BILATERAL     CYSTOSCOPY WITH BIOPSY  03/08/2012   Procedure: CYSTOSCOPY WITH BIOPSY;  Surgeon: Claybon Jabs, MD;  Location: [place];  Service: Urology;  Laterality: N/A;  gyrus   TONSILLECTOMY  CHILD   TRANSURETHRAL INCISION OF PROSTATE  03/08/2012   Procedure: TRANSURETHRAL INCISION OF THE PROSTATE (TUIP);  Surgeon: Claybon Jabs, MD;  Location: [facility];  Service: Urology;  Laterality: N/A;   TRANSURETHRAL RESECTION OF BLADDER TUMOR  01/26/2012   Procedure: TRANSURETHRAL RESECTION OF BLADDER TUMOR (TURBT);  Surgeon: Claybon Jabs, MD;  Location: [facility];  Service: Urology;  Laterality: N/A;  GYRUS MYTOMICIN C    HPI:  Pt is a 88 y.o. male who presented to [facility] ED from home due to vomiting and diarrhea. CT abdomen pelvis with contrast with no evidence of obstruction.  CT angio PE no evidence of pulmonary embolism.  However showing bilateral lower lobe infiltrates left greater than right, suggestive of pneumonia. Pt has history of dysphagia. MBS 06/11/22 showed mild oropharyngeal phase dysphagia and primary structural dysphagia secondary to cervical osteophytes as well as prominent cricopharyngeal bar. Swallow  initiation delay with thin liquids also noted. 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 difficile infection June 20, 2022, and pulmonary embolism.    Assessment / Plan / Recommendation  Clinical Impression   Pt seen for swallow assessment due to history of dysphagia and recent pneumonia diagnosis. Daughter reports pt consistently coughs while eating. Previous MBS (05/2022) showed mild oropharyngeal dysphagia and structural dysphagia secondary to cervical osteophytes/cricopharyngeal bar (present since 2019). Oral motor assessment was grossly WFL, however, pt is edentulous. Upper and lower dentures in place for all trials. Trails of thin liquids, puree and regular consistency given. Suspected swallow initiation delay noted with across all trials, and immediate cough noted with thin liquids and puree consistency. S/sx of aspiration noted are consistent with previous MBS suggesting no significant change in baseline swallow function. Discussed options of thickened liquids v repeat MBS, daughter believes pt would refuse thickened liquids and become dehydrated. If pnuemonia becomes recurrent, repeat MBS should be reconsidered at that time. Thin liquid/dys 3 diet recommended as daughter requested chopped meats, full supervision to control rate/size of bolus, medications crushed in puree. SLP will sign off as suspect pt is at baseline function. SLP Visit Diagnosis: Dysphagia, unspecified (R13.10)    Aspiration Risk  Moderate aspiration risk    Diet Recommendation Dysphagia 3 (Mech soft);Thin liquid   Liquid Administration via: Cup;Straw Medication Administration: Crushed with puree Supervision: Full supervision/cueing for compensatory strategies Compensations: Small sips/bites;Slow rate;Minimize environmental distractions Postural Changes: Seated upright at 90 degrees    Other  Recommendations  Oral Care  Recommendations: Oral care BID    Recommendations for follow up therapy are one component of a multi-disciplinary discharge planning process, led by the attending physician.  Recommendations may be updated based on patient status, additional functional criteria and insurance authorization.  Follow up Recommendations Follow physician's recommendations for discharge plan and follow up therapies      Assistance Recommended at Discharge PRN  Functional Status Assessment Patient has not had a recent decline in their functional status  Frequency and Duration            Prognosis        Swallow Study   General Date of Onset: 09/16/22 HPI: Pt is a 88 y.o. male who presented to [facility] ED from home due to vomiting and diarrhea. CT abdomen pelvis with contrast with no evidence of obstruction.  CT angio PE no evidence of pulmonary embolism.  However showing bilateral lower lobe infiltrates left greater than right, suggestive of pneumonia. Pt has history of dysphagia. MBS 06/11/22 showed mild oropharyngeal phase dysphagia and primary structural dysphagia secondary to cervical osteophytes as well as prominent cricopharyngeal bar. Swallow initiation delay with thin liquids also noted. 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 difficile infection June 20, 2022, and pulmonary embolism. Type of Study: Bedside Swallow Evaluation Previous Swallow Assessment: 06/2022 Diet Prior to this Study: NPO Temperature Spikes Noted: No Respiratory Status: Nasal cannula History of Recent Intubation: No Behavior/Cognition: Alert;Cooperative;Pleasant mood Oral Cavity Assessment: Within Functional Limits Oral Care Completed by SLP: No Oral Cavity - Dentition: Dentures, top;Dentures, bottom Vision: Functional for self-feeding Self-Feeding Abilities: Needs assist Patient Positioning: Upright in bed Baseline Vocal Quality:  Normal Volitional Cough: Strong Volitional Swallow: Able to elicit    Oral/Motor/Sensory Function Overall Oral Motor/Sensory Function: Within functional limits   Ice Chips Ice chips: Not tested   Thin Liquid Thin Liquid: Impaired Presentation: Straw;Self Fed Oral Phase Functional Implications: Oral holding Pharyngeal  Phase Impairments: Cough - Immediate;Suspected delayed Swallow;Cough - Delayed    Nectar Thick Nectar Thick Liquid: Not tested   Honey Thick Honey Thick Liquid: Not tested   Puree Puree: Impaired Presentation: Self Fed;Spoon Pharyngeal Phase Impairments: Suspected delayed Swallow;Multiple swallows;Cough - Delayed   Solid     Solid: Within functional limits Presentation: [place] Student SLP  09/17/2022,11:24 AM

## 2022-09-17 NOTE — Progress Notes (Signed)
PROGRESS NOTE    [name]  PPJ:[id number] DOB: 1930-10-19 DOA: 09/16/2022 PCP: Crist Infante, MD   Chief Complaint  Patient presents with   Emesis / Diarrhea    Sepsis    Brief Narrative:    Paul [name] is a 88 y.o. male with medical history significant for dementia, peripheral artery disease, history of bladder cancer, hypertension, hyperlipidemia, CVA, recent C. difficile infection June 20, 2022, history of pulmonary embolism on Eliquis, who presented to [facility] ED from home due to vomiting and diarrhea.  Completed his course of p.o. vancomycin about a week ago, then started having recurrent diarrhea.  He is in the room accompanied by his daughter.  Has had subjective fevers at home and chills.  His daughter also endorses dysphagia, intermittently worse with liquids.   In the ED, had large emesis of brown liquid.  Hypoxic with O2 saturation 81% on room air, was placed on 15 L HFNC and wean down to 4 L nasal cannula.  Not on O2 supplementation at baseline.  Febrile with Tmax 101.7.  COVID-19 screening test negative.  CT abdomen pelvis with contrast with no evidence of obstruction.  CT angio PE no evidence of pulmonary embolism.  However showing bilateral lower lobe infiltrates left greater than right, suggestive of pneumonia.  Chronic left pleural effusion.   ED Course: Tmax 101.7.  BP 166/86, pulse 98, respiration rate 20, O2 saturation 98% on 4 L.  Lab studies remarkable for serum bicarb 20, glucose 185, creatinine 1.20 with GFR 57.  Baseline creatinine 0.9 with GFR greater than 60.  WBC 14.2.   Assessment & Plan:   Principal Problem:   Acute respiratory failure with hypoxia (HCC)   Acute hypoxic respiratory failure  Sepsis present on admission Sepsis patient on admission, febrile 101, elevated lactic acid, tachypneic due to pneumonia, likely aspiration  -No oxygen requirement, initial saturation 78 to 82% on room air, currently on 4 L nasal cannula  -Work-up significant for  pneumonia, likely aspiration as daughter reports patient has been coughing frequently with eating and drinking  -Continue with IV cefepime, with p.o. threshold to discontinue due to history of recurrent C. difficile diarrhea . -Wean oxygen as tolerated .   Intractable nausea and diarrhea Recent C. difficile infection Check C. difficile PCR Start p.o. vancomycin 125 mg daily. Start Florastor 250 mg twice daily If C. difficile is negative start Imodium as needed. IV antiemetics as needed Aspiration precautions.   Dysphagia, worsened with liquids Speech therapist consulted to evaluate Aspiration precautions.   Hypophosphatemia -Repleted, monitor closely   Generalized weakness PT OT to assess Fall precautions   History of PE on Eliquis Resume home Eliquis       DVT prophylaxis: Code Status: Family Communication:  Disposition:   Status is: Inpatient    Consultants:  None  Subjective:  Objective: Vitals:   09/17/22 0802 09/17/22 0930 09/17/22 1045 09/17/22 1152  BP:  (!) 111/59 (!) 104/54   Pulse:  72 73   Resp:  18 18   Temp:    98.4 F (36.9 C)  TempSrc:    Oral  SpO2: 98% 98% 97%   Weight:        Intake/Output Summary (Last 24 hours) at 09/17/2022 1315 Last data filed at 09/17/2022 0001 Gross per 24 hour  Intake 1100 ml  Output --  Net 1100 ml   Filed Weights   09/16/22 2100  Weight: 66.2 kg    Examination:  Awake Alert, frail, deconditioned, chronically  ill-appearing Symmetrical Chest wall movement, diminished air entry at left lung base with scattered Rales RRR,No Gallops,Rubs or new Murmurs, No Parasternal Heave +ve B.Sounds, Abd Soft, No tenderness, No rebound - guarding or rigidity. No Cyanosis, Clubbing or edema, No new Rash or bruise     Data Reviewed: I have personally reviewed following labs and imaging studies  CBC: Recent Labs  Lab 09/16/22 2055 09/17/22 0350  WBC 14.2* 7.5  NEUTROABS 12.7* 6.6  HGB 13.4 12.4*  HCT 43.2 39.3   MCV 90.9 89.9  PLT 351 329    Basic Metabolic Panel: Recent Labs  Lab 09/16/22 2055 09/17/22 0350  NA 140 136  K 3.9 4.1  CL 107 107  CO2 20* 22  GLUCOSE 185* 135*  BUN 21 19  CREATININE 1.20 1.27*  CALCIUM 9.2 8.5*  MG  --  1.7  PHOS  --  1.7*    GFR: Estimated Creatinine Clearance: 36.2 mL/min (A) (by C-G formula based on SCr of 1.27 mg/dL (H)).  Liver Function Tests: Recent Labs  Lab 09/16/22 2055 09/17/22 0350  AST 22 23  ALT 14 14  ALKPHOS 75 58  BILITOT 0.7 0.6  PROT 6.7 5.7*  ALBUMIN 3.9 3.2*    CBG: Recent Labs  Lab 09/17/22 0300 09/17/22 0830 09/17/22 1205  GLUCAP 119* 111* 122*     Recent Results (from the past 240 hour(s))  Blood culture (routine x 2)     Status: None (Preliminary result)   Collection Time: 09/16/22  8:58 PM   Specimen: BLOOD  Result Value Ref Range Status   Specimen Description BLOOD BLOOD LEFT HAND  Final   Special Requests   Final    BOTTLES DRAWN AEROBIC AND ANAEROBIC Blood Culture adequate volume   Culture   Final    NO GROWTH < 12 HOURS Performed at [facility], [place] [address], [place], [place] [address]    Report Status PENDING  Incomplete  Blood culture (routine x 2)     Status: None (Preliminary result)   Collection Time: 09/16/22  9:39 PM   Specimen: BLOOD  Result Value Ref Range Status   Specimen Description BLOOD RIGHT ANTECUBITAL  Final   Special Requests   Final    BOTTLES DRAWN AEROBIC AND ANAEROBIC Blood Culture results may not be optimal due to an excessive volume of blood received in culture bottles   Culture   Final    NO GROWTH < 12 HOURS Performed at [facility], [place] [address], [place], [place] [address]    Report Status PENDING  Incomplete  Resp Panel by RT-PCR (Flu A&B, Covid) Urine, Clean Catch     Status: None   Collection Time: 09/16/22  9:48 PM   Specimen: Urine, Clean Catch; Nasal Swab  Result Value Ref Range Status   SARS Coronavirus 2 by RT PCR NEGATIVE NEGATIVE Final     Comment: (NOTE) SARS-CoV-2 target nucleic acids are NOT DETECTED.  The SARS-CoV-2 RNA is generally detectable in upper respiratory specimens during the acute phase of infection. The lowest concentration of SARS-CoV-2 viral copies this assay can detect is 138 copies/mL. A negative result does not preclude SARS-Cov-2 infection and should not be used as the sole basis for treatment or other patient management decisions. A negative result may occur with  improper specimen collection/handling, submission of specimen other than nasopharyngeal swab, presence of viral mutation(s) within the areas targeted by this assay, and inadequate number of viral copies(<138 copies/mL). A negative result  must be combined with clinical observations, patient history, and epidemiological information. The expected result is Negative.  Fact Sheet for Patients:  EntrepreneurPulse.com.au  Fact Sheet for Healthcare Providers:  IncredibleEmployment.be  This test is no t yet approved or cleared by the [place] FDA and  has been authorized for detection and/or diagnosis of SARS-CoV-2 by FDA under an Emergency Use Authorization (EUA). This EUA will remain  in effect (meaning this test can be used) for the duration of the COVID-19 declaration under Section 564(b)(1) of the Act, 21 U.S.C.section 360bbb-3(b)(1), unless the authorization is terminated  or revoked sooner.       Influenza A by PCR NEGATIVE NEGATIVE Final   Influenza B by PCR NEGATIVE NEGATIVE Final    Comment: (NOTE) The Xpert Xpress SARS-CoV-2/FLU/RSV plus assay is intended as an aid in the diagnosis of influenza from Nasopharyngeal swab specimens and should not be used as a sole basis for treatment. Nasal washings and aspirates are unacceptable for Xpert Xpress SARS-CoV-2/FLU/RSV testing.  Fact Sheet for Patients: EntrepreneurPulse.com.au  Fact Sheet for Healthcare  Providers: IncredibleEmployment.be  This test is not yet approved or cleared by the [place] FDA and has been authorized for detection and/or diagnosis of SARS-CoV-2 by FDA under an Emergency Use Authorization (EUA). This EUA will remain in effect (meaning this test can be used) for the duration of the COVID-19 declaration under Section 564(b)(1) of the Act, 21 U.S.C. section 360bbb-3(b)(1), unless the authorization is terminated or revoked.  Performed at [facility], [place] [address] [place], [place] [address]          Radiology Studies: CT Angio Chest Pulmonary Embolism (PE) W or WO Contrast  Result Date: 09/16/2022 CLINICAL DATA:  Vomiting, diarrhea, fever, recent C diff, history of PE EXAM: CT ANGIOGRAPHY CHEST CT ABDOMEN AND PELVIS WITH CONTRAST TECHNIQUE: Multidetector CT imaging of the chest was performed using the standard protocol during bolus administration of intravenous contrast. Multiplanar CT image reconstructions and MIPs were obtained to evaluate the vascular anatomy. Multidetector CT imaging of the abdomen and pelvis was performed using the standard protocol during bolus administration of intravenous contrast. RADIATION DOSE REDUCTION: This exam was performed according to the departmental dose-optimization program which includes automated exposure control, adjustment of the mA and/or kV according to patient size and/or use of iterative reconstruction technique. CONTRAST:  1103m OMNIPAQUE IOHEXOL 350 MG/ML SOLN COMPARISON:  CTA chest dated 07/29/2022. CT chest abdomen pelvis dated 06/20/2022. FINDINGS: CTA CHEST FINDINGS Cardiovascular: Satisfactory opacification the bilateral pulmonary arteries to the segmental level. No evidence of pulmonary embolism. Specifically, the prior segmental left lower lobe pulmonary embolism has resolved. Although not tailored for evaluation of the thoracic aorta, there is no evidence thoracic aortic aneurysm or  dissection. Atherosclerotic calcifications of the arch. The heart is normal in size.  No pericardial effusion. Mild coronary sclerosis of the LAD and right coronary artery. Mediastinum/Nodes: No suspicious mediastinal lymphadenopathy. Visualized thyroid is unremarkable. Lungs/Pleura: Rounded atelectasis/collapse in the left lower lobe. Associated moderate chronic left pleural effusion with pleural thickening, similar to the prior. Mild patchy right lower lobe opacity, likely atelectasis. New patchy/nodular opacities in the posterior left upper lobe (series 7/image 37), suggesting mild infection/pneumonia. Evaluation lung parenchyma is constrained by respiratory motion. Within that constraint, there are no suspicious pulmonary nodules. Mild centrilobular emphysematous changes, upper lobe predominant. No pneumothorax. Musculoskeletal: Moderate degenerative changes of the thoracic spine. Review of the MIP images confirms the above findings. CT ABDOMEN and PELVIS FINDINGS Motion degraded images.  Hepatobiliary: 10 mm cyst versus hemangioma in the central right liver (series 3/image 26), benign. Gallbladder is unremarkable. No intrahepatic or extrahepatic duct dilatation. Pancreas: Within normal limits. Spleen: Within normal limits. Adrenals/Urinary Tract: Adrenal glands are within normal limits. Right renal scarring. Left kidney is within normal limits. No hydronephrosis. Bladder is mildly thick-walled although underdistended. Stomach/Bowel: Stomach is within normal limits. No evidence of bowel obstruction. Appendix is not discretely visualized. Sigmoid diverticulosis, without evidence of diverticulitis. No colonic wall thickening or inflammatory changes in this patient with recent history of C diff. Vascular/Lymphatic: No evidence of abdominal aortic aneurysm. Atherosclerotic calcifications of the abdominal aorta and branch vessels. No suspicious abdominopelvic lymphadenopathy. Reproductive: Prostatomegaly, suggesting  BPH. Other: No abdominopelvic ascites. Tiny fat containing left inguinal hernia. Musculoskeletal: Moderate degenerative changes of the lumbar spine. Review of the MIP images confirms the above findings. IMPRESSION: No evidence of pulmonary embolism. Specifically, the prior segmental left lower lobe pulmonary embolism has resolved. New patchy/nodular opacities in the posterior left upper lobe, suggesting mild infection/pneumonia. Rounded atelectasis/collapse in the left lower lobe. Associated moderate chronic left pleural effusion with pleural thickening. Both findings are chronic. No colonic wall thickening or inflammatory changes in this patient with recent history of C diff. Sigmoid diverticulosis, without evidence of diverticulitis. Additional stable ancillary findings as above. Aortic Atherosclerosis (ICD10-I70.0) and Emphysema (ICD10-J43.9). Electronically Signed   By: Julian Hy M.D.   On: 09/16/2022 23:01   CT ABDOMEN PELVIS W CONTRAST  Result Date: 09/16/2022 CLINICAL DATA:  Vomiting, diarrhea, fever, recent C diff, history of PE EXAM: CT ANGIOGRAPHY CHEST CT ABDOMEN AND PELVIS WITH CONTRAST TECHNIQUE: Multidetector CT imaging of the chest was performed using the standard protocol during bolus administration of intravenous contrast. Multiplanar CT image reconstructions and MIPs were obtained to evaluate the vascular anatomy. Multidetector CT imaging of the abdomen and pelvis was performed using the standard protocol during bolus administration of intravenous contrast. RADIATION DOSE REDUCTION: This exam was performed according to the departmental dose-optimization program which includes automated exposure control, adjustment of the mA and/or kV according to patient size and/or use of iterative reconstruction technique. CONTRAST:  159m OMNIPAQUE IOHEXOL 350 MG/ML SOLN COMPARISON:  CTA chest dated 07/29/2022. CT chest abdomen pelvis dated 06/20/2022. FINDINGS: CTA CHEST FINDINGS Cardiovascular:  Satisfactory opacification the bilateral pulmonary arteries to the segmental level. No evidence of pulmonary embolism. Specifically, the prior segmental left lower lobe pulmonary embolism has resolved. Although not tailored for evaluation of the thoracic aorta, there is no evidence thoracic aortic aneurysm or dissection. Atherosclerotic calcifications of the arch. The heart is normal in size.  No pericardial effusion. Mild coronary sclerosis of the LAD and right coronary artery. Mediastinum/Nodes: No suspicious mediastinal lymphadenopathy. Visualized thyroid is unremarkable. Lungs/Pleura: Rounded atelectasis/collapse in the left lower lobe. Associated moderate chronic left pleural effusion with pleural thickening, similar to the prior. Mild patchy right lower lobe opacity, likely atelectasis. New patchy/nodular opacities in the posterior left upper lobe (series 7/image 37), suggesting mild infection/pneumonia. Evaluation lung parenchyma is constrained by respiratory motion. Within that constraint, there are no suspicious pulmonary nodules. Mild centrilobular emphysematous changes, upper lobe predominant. No pneumothorax. Musculoskeletal: Moderate degenerative changes of the thoracic spine. Review of the MIP images confirms the above findings. CT ABDOMEN and PELVIS FINDINGS Motion degraded images. Hepatobiliary: 10 mm cyst versus hemangioma in the central right liver (series 3/image 26), benign. Gallbladder is unremarkable. No intrahepatic or extrahepatic duct dilatation. Pancreas: Within normal limits. Spleen: Within normal limits. Adrenals/Urinary Tract: Adrenal glands are within  normal limits. Right renal scarring. Left kidney is within normal limits. No hydronephrosis. Bladder is mildly thick-walled although underdistended. Stomach/Bowel: Stomach is within normal limits. No evidence of bowel obstruction. Appendix is not discretely visualized. Sigmoid diverticulosis, without evidence of diverticulitis. No colonic  wall thickening or inflammatory changes in this patient with recent history of C diff. Vascular/Lymphatic: No evidence of abdominal aortic aneurysm. Atherosclerotic calcifications of the abdominal aorta and branch vessels. No suspicious abdominopelvic lymphadenopathy. Reproductive: Prostatomegaly, suggesting BPH. Other: No abdominopelvic ascites. Tiny fat containing left inguinal hernia. Musculoskeletal: Moderate degenerative changes of the lumbar spine. Review of the MIP images confirms the above findings. IMPRESSION: No evidence of pulmonary embolism. Specifically, the prior segmental left lower lobe pulmonary embolism has resolved. New patchy/nodular opacities in the posterior left upper lobe, suggesting mild infection/pneumonia. Rounded atelectasis/collapse in the left lower lobe. Associated moderate chronic left pleural effusion with pleural thickening. Both findings are chronic. No colonic wall thickening or inflammatory changes in this patient with recent history of C diff. Sigmoid diverticulosis, without evidence of diverticulitis. Additional stable ancillary findings as above. Aortic Atherosclerosis (ICD10-I70.0) and Emphysema (ICD10-J43.9). Electronically Signed   By: Julian Hy M.D.   On: 09/16/2022 23:01   DG Abd 2 Views  Result Date: 09/16/2022 CLINICAL DATA:  Nausea and vomiting EXAM: ABDOMEN - 2 VIEW COMPARISON:  None Available. FINDINGS: There is a large air-fluid level in the stomach. No other dilated bowel loops are seen. Air seen to the level the rectum. There are phleboliths in the pelvis. There is a small left pleural effusion. Degenerative changes affect the spine. IMPRESSION: There is a large air-fluid level in the stomach. No other dilated bowel loops are seen. Electronically Signed   By: Ronney Asters M.D.   On: 09/16/2022 22:10   DG Chest Port 1 View  Result Date: 09/16/2022 CLINICAL DATA:  Vomiting, diarrhea. EXAM: PORTABLE CHEST 1 VIEW COMPARISON:  July 29, 2022.  FINDINGS: The heart size and mediastinal contours are within normal limits. Mild right basilar atelectasis or scarring is noted. Stable left pleural effusion is noted with associated left lower lobe pneumonia or atelectasis. The visualized skeletal structures are unremarkable. IMPRESSION: Stable left pleural effusion is noted with associated left lower lobe pneumonia or atelectasis. Mild right basilar atelectasis or scarring is noted. Electronically Signed   By: Marijo Conception M.D.   On: 09/16/2022 21:24        Scheduled Meds:  apixaban  5 mg Oral BID   escitalopram  20 mg Oral Daily   ezetimibe  10 mg Oral Daily   insulin aspart  0-5 Units Subcutaneous QHS   insulin aspart  0-9 Units Subcutaneous TID WC   saccharomyces boulardii  250 mg Oral BID   vancomycin  125 mg Oral Daily   Continuous Infusions:  ceFEPime (MAXIPIME) IV Stopped (09/17/22 1151)   lactated ringers 75 mL/hr at 09/17/22 1130   potassium PHOSPHATE IVPB (in mmol) 30 mmol (09/17/22 0839)   vancomycin       LOS: 1 day       Phillips Climes, MD [facility]   To contact the attending provider between 7A-7P or the covering provider during after hours 7P-7A, please log into the web site [email or web address] and access using universal [place] password for that web site. If you do not have the password, please call the hospital operator.  09/17/2022, 1:15 PM

## 2022-09-17 NOTE — Sepsis Progress Note (Signed)
2nd LA showing in results review as collected on 10/3 @ 0011. This can't be accurate as patient was not in the hospital at this time.  Believe it is suppose to be 10/4 @ 0011. Reached out to lab and they state they can't fix it & on their end there actually is no collectin time in the system.  Messaged Bedside nurse to see if she can re-draw lactic acid.

## 2022-09-18 ENCOUNTER — Telehealth (HOSPITAL_COMMUNITY): Payer: Self-pay

## 2022-09-18 ENCOUNTER — Inpatient Hospital Stay (HOSPITAL_COMMUNITY): Payer: PPO

## 2022-09-18 ENCOUNTER — Other Ambulatory Visit (HOSPITAL_COMMUNITY): Payer: Self-pay

## 2022-09-18 DIAGNOSIS — J690 Pneumonitis due to inhalation of food and vomit: Secondary | ICD-10-CM | POA: Diagnosis not present

## 2022-09-18 DIAGNOSIS — J9601 Acute respiratory failure with hypoxia: Secondary | ICD-10-CM | POA: Diagnosis not present

## 2022-09-18 DIAGNOSIS — A0472 Enterocolitis due to Clostridium difficile, not specified as recurrent: Secondary | ICD-10-CM

## 2022-09-18 DIAGNOSIS — J189 Pneumonia, unspecified organism: Secondary | ICD-10-CM | POA: Diagnosis not present

## 2022-09-18 LAB — RESPIRATORY PANEL BY PCR

## 2022-09-18 LAB — GLUCOSE, CAPILLARY
Glucose-Capillary: 107 mg/dL — ABNORMAL HIGH (ref 70–99)
Glucose-Capillary: 122 mg/dL — ABNORMAL HIGH (ref 70–99)
Glucose-Capillary: 135 mg/dL — ABNORMAL HIGH (ref 70–99)
Glucose-Capillary: 139 mg/dL — ABNORMAL HIGH (ref 70–99)
Glucose-Capillary: 64 mg/dL — ABNORMAL LOW (ref 70–99)
Glucose-Capillary: 73 mg/dL (ref 70–99)
Glucose-Capillary: 76 mg/dL (ref 70–99)

## 2022-09-18 LAB — CBC
HCT: 30.8 % — ABNORMAL LOW (ref 39.0–52.0)
Hemoglobin: 10.1 g/dL — ABNORMAL LOW (ref 13.0–17.0)
MCH: 28.7 pg (ref 26.0–34.0)
MCHC: 32.8 g/dL (ref 30.0–36.0)
MCV: 87.5 fL (ref 80.0–100.0)
Platelets: 189 10*3/uL (ref 150–400)
RBC: 3.52 MIL/uL — ABNORMAL LOW (ref 4.22–5.81)
RDW: 14.5 % (ref 11.5–15.5)
WBC: 7.4 10*3/uL (ref 4.0–10.5)
nRBC: 0 % (ref 0.0–0.2)

## 2022-09-18 LAB — BASIC METABOLIC PANEL
Anion gap: 7 (ref 5–15)
BUN: 21 mg/dL (ref 8–23)
CO2: 22 mmol/L (ref 22–32)
Calcium: 8 mg/dL — ABNORMAL LOW (ref 8.9–10.3)
Chloride: 106 mmol/L (ref 98–111)
Creatinine, Ser: 1.23 mg/dL (ref 0.61–1.24)
GFR, Estimated: 56 mL/min — ABNORMAL LOW (ref >60)
Glucose, Bld: 85 mg/dL (ref 70–99)
Potassium: 3.7 mmol/L (ref 3.5–5.1)
Sodium: 135 mmol/L (ref 135–145)

## 2022-09-18 LAB — PHOSPHORUS: Phosphorus: 2.5 mg/dL (ref 2.5–4.6)

## 2022-09-18 MED ORDER — PHENOL 1.4 % MT LIQD: 1.0000 | OROMUCOSAL | Status: DC | PRN

## 2022-09-18 MED ORDER — SODIUM CHLORIDE 0.9 % IV SOLN
3.0000 g | Freq: Three times a day (TID) | INTRAVENOUS | Status: DC
  Administered 2022-09-18 – 2022-09-19 (×2): 3 g via INTRAVENOUS
  Filled 2022-09-18 (×2): qty 8

## 2022-09-18 MED ORDER — ENSURE ENLIVE PO LIQD
237.0000 mL | Freq: Three times a day (TID) | ORAL | Status: DC
  Administered 2022-09-18 – 2022-09-21 (×9): 237 mL via ORAL

## 2022-09-18 MED ORDER — K PHOS MONO-SOD PHOS DI & MONO 155-852-130 MG PO TABS
250.0000 mg | ORAL_TABLET | Freq: Two times a day (BID) | ORAL | Status: AC
  Administered 2022-09-18 – 2022-09-19 (×4): 250 mg via ORAL
  Filled 2022-09-18 (×5): qty 1

## 2022-09-18 MED ORDER — VANCOMYCIN HCL 125 MG PO CAPS
125.0000 mg | ORAL_CAPSULE | Freq: Four times a day (QID) | ORAL | Status: DC
  Administered 2022-09-18 – 2022-09-19 (×5): 125 mg via ORAL
  Filled 2022-09-18 (×8): qty 1

## 2022-09-18 MED ORDER — MENTHOL 3 MG MT LOZG: 1.0000 | LOZENGE | OROMUCOSAL | Status: DC | PRN

## 2022-09-18 NOTE — Consult Note (Signed)
Paul [name] for Infectious Disease    Date of Admission:  09/16/2022     Reason for Consult: recurrent cdiff     Referring Provider: Phillips Climes  Abx: 10/5-c PO vanc  10/4-c vanc 10/4-c cefepime        Assessment: 88 yo male with DNR status, dementia, PAD, hx bladder cancer, HFpEF, CVA (complicated by dysphagia, hx aspiration pna), hx PE (dx'ed 07/29/22) on Eliquis, recurrent cdiff  admitted from home 10/03 due to n/v/diarrhea with brief hypoxia on the day of presentation. The last 24 hours since admission watery diarrhea had escalated and a rectal tube was required  10/3 cdiff antigen and pcr positive; cdiff toxin negative (July positive antigen, toxin, and pcr; no cdiff testing in between yet) 10/3 bcx ngtd 10/3 ucx contaminated 10/5 respiratory viral pcr pending  Clinically he appears to have cdiff colitis and I would treat. I am not sure if this is a true 2nd relapse or first relapse. When his pcp started the po vanc 7/21, he understandably still her intermittent diarrhea but better than initial episode early 06/2022. And given other morbidities, reasonable to get on another long PO vanc taper course rather than fecal matter transplant    With his hypoxia/ct finding of lul opacity not visualized on xray, I think this is simply aspiration pneumonitis. His hypoxia recovered within hours. He has known aspiration and that appears to be a daily thing where he coughs/choke. While the distinction between pneumonitis and pna can be difficult at time, he is rather nontoxic, on room air, no dyspnea, and so we can stop systemic abx and monitor. Cdiff tx should be priority   He has chronic left sided pleural effusion which appear stable and likely due to prior aspiration sequela     Plan: Stop systemic abx Continue po vancomycin; will given around 5 day for effect to be demonstrated Anticipate long tail end pulse tapered qod dosing of the po vanc  Discussed with  primary team   I spent 75 minute reviewing data/chart, and coordinating care and >50% direct face to face time providing counseling/discussing diagnostics/treatment plan with patient      ------------------------------------------------ Principal Problem:   Acute respiratory failure with hypoxia ([place])    HPI: Paul [name] is a 88 y.o. male DNR status, dementia, PAD, hx bladder cancer, HFpEF, CVA (complicated by dysphagia, hx aspiration pna), hx PE (dx'ed 07/29/22) on Eliquis, recurrent cdiff  admitted from home 10/03 due to n/v/diarrhea  Hx discussed with patient (poor historian) and daughter at bedside and chart review  Patient has had several admissions since 05/2022 for various issues including cdiff  05/2022 for pna RLL/complex left pleural effusion. Too small for thoracentesis. Given abx for 1 week  06/2022 admission cdiff/sepsis complicated by pantoa bsi. Treated with bactrim and difficid  7/24 clinic visit mentioned recurrent diarrhea after dificid course and given PO vanc by pcp; started 7/21. Unclear how many days given but appear to be a long tapered course as indicated by 07/31/2022 discharge summary  07/29/2022 admission with PE. Pulmonology was also consulted for chronic pleural effusion. No diagnostics done for effusion; continued on supportive care. As above mentioned remains on po vanc at discharge  Per daughter patient's diarrhea was better but still remains intermittent when the initial po vanc taper course was given. He finished this 1 week prior to this admission. 2 days prior to admission developed malaise, chill, poor appetite, nausea/vomiting and increasing diarrhea.  Family  called ems  On presentation had fever Slight mild leukocytosis and slightly hypoxic Ct c/a/p showed new lul opacity Cdiff testing positive pcr/antigen; negative toxin Bcx ngtd Started on empiric abx for pna  Cdiff tx started yesterday with result return  Today increasing diarrhea and a  rectal tube was placed.  He had quickly resolved his hypoxia within 24hours of admission  His voice is hoarse today which is new  I asked them about prior palliative care discussion from previous discussion but daughter mentioned that didn't go anywhere   Family History  Problem Relation Age of Onset   Kidney cancer Mother    Hypertension Father     Social History   Tobacco Use   Smoking status: Former    Years: 30.00    Types: Cigarettes    Quit date: 01/20/1981    Years since quitting: 41.6   Smokeless tobacco: Never  Substance Use Topics   Alcohol use: No   Drug use: No    Allergies  Allergen Reactions   Livalo [Pitavastatin] Other (See Comments)    Cramping and back pain   Haldol [Haloperidol] Other (See Comments)    Family does not like his affect when he is given this - makes him a "zombie."   Other Diarrhea and Other (See Comments)    Unnamed patch and tablets for dementia caused diarrhea (Possibly galantamine, from outside source)     Review of Systems: ROS All Other ROS was negative, except mentioned above   Past Medical History:  Diagnosis Date   Back pain    Bladder tumor    BPH (benign prostatic hyperplasia)    Clostridioides difficile infection 06/10/2022   Hyperlipidemia    Hypertension    Impaired hearing BILATERAL HEARING AIDS   Personal history of gastric ulcer    Pneumonia 06/10/2022       Scheduled Meds:  apixaban  5 mg Oral BID   donepezil  5 mg Oral Daily   escitalopram  20 mg Oral Daily   ezetimibe  10 mg Oral Daily   feeding supplement  237 mL Oral TID BM   influenza vaccine adjuvanted  0.5 mL Intramuscular Tomorrow-1000   insulin aspart  0-5 Units Subcutaneous QHS   insulin aspart  0-9 Units Subcutaneous TID WC   saccharomyces boulardii  250 mg Oral BID   vancomycin  125 mg Oral QID   Continuous Infusions:  sodium chloride Stopped (09/18/22 0028)   ceFEPime (MAXIPIME) IV 2 g (09/18/22 1028)   vancomycin Stopped (09/18/22  0214)   PRN Meds:.sodium chloride, acetaminophen, menthol-cetylpyridinium, ondansetron (ZOFRAN) IV, phenol   OBJECTIVE: Blood pressure (!) 128/55, pulse 67, temperature 98.7 F (37.1 C), temperature source Oral, resp. rate 20, weight 66.2 kg, SpO2 96 %.  Physical Exam  General/constitutional: no distress, pleasant, watching tv in bed, conversant but hoarse voice HEENT: Normocephalic, PER, Conj Clear, EOMI, Oropharynx clear Neck supple CV: rrr no mrg Lungs: clear to auscultation, normal respiratory effort Abd: Soft, Nontender Ext: no edema Skin: No Rash Neuro: nonfocal MSK: no peripheral joint swelling/tenderness/warmth; back spines nontender   Central line presence: no  Rectal tube present; per nursing bag was changed 2 hours prior to my visit   Lab Results Lab Results  Component Value Date   WBC 7.4 09/18/2022   HGB 10.1 (L) 09/18/2022   HCT 30.8 (L) 09/18/2022   MCV 87.5 09/18/2022   PLT 189 09/18/2022    Lab Results  Component Value Date   CREATININE 1.23 09/18/2022  BUN 21 09/18/2022   NA 135 09/18/2022   K 3.7 09/18/2022   CL 106 09/18/2022   CO2 22 09/18/2022    Lab Results  Component Value Date   ALT 14 09/17/2022   AST 23 09/17/2022   ALKPHOS 58 09/17/2022   BILITOT 0.6 09/17/2022      Microbiology: Recent Results (from the past 240 hour(s))  Urine Culture     Status: Abnormal   Collection Time: 09/16/22  8:53 PM   Specimen: In/Out Cath Urine  Result Value Ref Range Status   Specimen Description IN/OUT CATH URINE  Final   Special Requests   Final    NONE Performed at [facility], [place] [address] [place], [place] [address]    Culture MULTIPLE SPECIES PRESENT, SUGGEST RECOLLECTION (A)  Final   Report Status 09/17/2022 FINAL  Final  Blood culture (routine x 2)     Status: None (Preliminary result)   Collection Time: 09/16/22  8:58 PM   Specimen: BLOOD  Result Value Ref Range Status   Specimen Description BLOOD BLOOD LEFT HAND  Final    Special Requests   Final    BOTTLES DRAWN AEROBIC AND ANAEROBIC Blood Culture adequate volume   Culture   Final    NO GROWTH 2 DAYS Performed at [facility], [place] [address], [place], [place] [address]    Report Status PENDING  Incomplete  Blood culture (routine x 2)     Status: None (Preliminary result)   Collection Time: 09/16/22  9:39 PM   Specimen: BLOOD  Result Value Ref Range Status   Specimen Description BLOOD RIGHT ANTECUBITAL  Final   Special Requests   Final    BOTTLES DRAWN AEROBIC AND ANAEROBIC Blood Culture results may not be optimal due to an excessive volume of blood received in culture bottles   Culture   Final    NO GROWTH 2 DAYS Performed at [facility], [place] [address] [place], [place] [address]    Report Status PENDING  Incomplete  Resp Panel by RT-PCR (Flu A&B, Covid) Urine, Clean Catch     Status: None   Collection Time: 09/16/22  9:48 PM   Specimen: Urine, Clean Catch; Nasal Swab  Result Value Ref Range Status   SARS Coronavirus 2 by RT PCR NEGATIVE NEGATIVE Final    Comment: (NOTE) SARS-CoV-2 target nucleic acids are NOT DETECTED.  The SARS-CoV-2 RNA is generally detectable in upper respiratory specimens during the acute phase of infection. The lowest concentration of SARS-CoV-2 viral copies this assay can detect is 138 copies/mL. A negative result does not preclude SARS-Cov-2 infection and should not be used as the sole basis for treatment or other patient management decisions. A negative result may occur with  improper specimen collection/handling, submission of specimen other than nasopharyngeal swab, presence of viral mutation(s) within the areas targeted by this assay, and inadequate number of viral copies(<138 copies/mL). A negative result must be combined with clinical observations, patient history, and epidemiological information. The expected result is Negative.  Fact Sheet for Patients:   EntrepreneurPulse.com.au  Fact Sheet for Healthcare Providers:  IncredibleEmployment.be  This test is no t yet approved or cleared by the [place] FDA and  has been authorized for detection and/or diagnosis of SARS-CoV-2 by FDA under an Emergency Use Authorization (EUA). This EUA will remain  in effect (meaning this test can be used) for the duration of the COVID-19 declaration under Section 564(b)(1) of the Act, 21 U.S.C.section  360bbb-3(b)(1), unless the authorization is terminated  or revoked sooner.       Influenza A by PCR NEGATIVE NEGATIVE Final   Influenza B by PCR NEGATIVE NEGATIVE Final    Comment: (NOTE) The Xpert Xpress SARS-CoV-2/FLU/RSV plus assay is intended as an aid in the diagnosis of influenza from Nasopharyngeal swab specimens and should not be used as a sole basis for treatment. Nasal washings and aspirates are unacceptable for Xpert Xpress SARS-CoV-2/FLU/RSV testing.  Fact Sheet for Patients: EntrepreneurPulse.com.au  Fact Sheet for Healthcare Providers: IncredibleEmployment.be  This test is not yet approved or cleared by the [place] FDA and has been authorized for detection and/or diagnosis of SARS-CoV-2 by FDA under an Emergency Use Authorization (EUA). This EUA will remain in effect (meaning this test can be used) for the duration of the COVID-19 declaration under Section 564(b)(1) of the Act, 21 U.S.C. section 360bbb-3(b)(1), unless the authorization is terminated or revoked.  Performed at [facility], [place] [address], [place], [place] [address]   C Difficile Quick Screen w PCR reflex     Status: Abnormal   Collection Time: 09/16/22 11:55 PM   Specimen: STOOL  Result Value Ref Range Status   C Diff antigen POSITIVE (A) NEGATIVE Final   C Diff toxin NEGATIVE NEGATIVE Final   C Diff interpretation Results are indeterminate. See PCR results.  Final    Comment:  Performed at [facility], [place] [address], [place], [place] [address]  C. Diff by PCR, Reflexed     Status: Abnormal   Collection Time: 09/16/22 11:55 PM  Result Value Ref Range Status   Toxigenic C. Difficile by PCR POSITIVE (A) NEGATIVE Final    Comment: Positive for toxigenic C. difficile with little to no toxin production. Only treat if clinical presentation suggests symptomatic illness. Performed at [facility], [place] [address] [place],  [address]      Serology:    Imaging: If present, new imagings (plain films, ct scans, and mri) have been personally visualized and interpreted; radiology reports have been reviewed. Decision making incorporated into the Impression / Recommendations.  10/03 ct chest pe protocol, abd/pelv No evidence of pulmonary embolism. Specifically, the prior segmental left lower lobe pulmonary embolism has resolved.   New patchy/nodular opacities in the posterior left upper lobe, suggesting mild infection/pneumonia.   Rounded atelectasis/collapse in the left lower lobe. Associated moderate chronic left pleural effusion with pleural thickening. Both findings are chronic.   No colonic wall thickening or inflammatory changes in this patient with recent history of C diff. Sigmoid diverticulosis, without evidence of diverticulitis.    Jabier Mutton, [place] [facility] [place] [phone] pager    09/18/2022, 1:18 PM

## 2022-09-18 NOTE — Evaluation (Signed)
Occupational Therapy Evaluation Patient Details Name: Paul [name] MRN: [id number] DOB: 1930-08-20 Today's Date: 09/18/2022   History of Present Illness 88 yo male who presented 09/16/22 with vomiting/diarrhea. Pt with acute hypoxic respiratory failure and sepsis due to PNA.  PMH Dementia, HTN, Cva of L medulla 01/2022, bladder CA, C diff, hypoxic respitory faluire, CHF, back pain, impaired hearing, pneumonia, PE   Clinical Impression   Pt admitted with the above diagnoses and presents with below problem list. Pt will benefit from continued acute OT to address the below listed deficits and maximize independence with basic ADLs prior to d/c home with family. At baseline, pt uses walker; only ambulates functional, in-home distances ie bathroom; cues for ADL tasks 2/2 dementia. Pt currently needs up to mod A with functional transfers and LB ADLs. Able to stand for pericare and take pivotal steps to access recliner with +1 assist. First time OOB in two days, tolerated well. Daughter present throughout session and confirms family able to continue to assist at home. Pt up in recliner at end of session.        Recommendations for follow up therapy are one component of a multi-disciplinary discharge planning process, led by the attending physician.  Recommendations may be updated based on patient status, additional functional criteria and insurance authorization.   Follow Up Recommendations  Home health OT    Assistance Recommended at Discharge Frequent or constant Supervision/Assistance  Patient can return home with the following A little help with walking and/or transfers;A little help with bathing/dressing/bathroom;Assistance with cooking/housework;Direct supervision/assist for medications management;Direct supervision/assist for financial management;Assist for transportation;Help with stairs or ramp for entrance    Functional Status Assessment  Patient has had a recent decline in their  functional status and demonstrates the ability to make significant improvements in function in a reasonable and predictable amount of time.  Equipment Recommendations  None recommended by OT    Recommendations for Other Services PT consult     Precautions / Restrictions Precautions Precautions: Fall Restrictions Weight Bearing Restrictions: No      Mobility Bed Mobility Overal bed mobility: Needs Assistance Bed Mobility: Supine to Sit     Supine to sit: HOB elevated, Min assist     General bed mobility comments: pt reaching for therapist hand for assist with trunk elevation. extra time and effort, cues for sequencing to pivot hips to full EOB position. Pt noted to return to supine on first attempt at coming to EOB, successful completion on second attempt.    Transfers Overall transfer level: Needs assistance Equipment used: Rolling walker (2 wheels) Transfers: Sit to/from Stand, Bed to chair/wheelchair/BSC Sit to Stand: Mod assist Stand pivot transfers: Mod assist, From elevated surface         General transfer comment: to/from EOB 2x then pivotal steps to access recliner. assist to steady walker as pt uses this to pull up with (baseline per daughter). steadying assist. sequencing cues. assist to advance walker during turns.      Balance Overall balance assessment: Needs assistance Sitting-balance support: Bilateral upper extremity supported, Feet supported Sitting balance-Leahy Scale: Fair Sitting balance - Comments: largely needs BUE support to maintain static sitting balance in unsupported sitting. posterior lean initially but improved with time Postural control: Posterior lean Standing balance support: Bilateral upper extremity supported, During functional activity, Reliant on assistive device for balance Standing balance-Leahy Scale: Poor Standing balance comment: rw and up to min A. stood for pericare (total assist for pericare tasks)  ADL either performed or assessed with clinical judgement   ADL Overall ADL's : Needs assistance/impaired Eating/Feeding: Set up;Sitting   Grooming: Minimal assistance;Cueing for sequencing;Sitting   Upper Body Bathing: Moderate assistance;Sitting;Cueing for sequencing   Lower Body Bathing: Moderate assistance;Sit to/from stand   Upper Body Dressing : Sitting;Cueing for sequencing;Minimal assistance   Lower Body Dressing: Moderate assistance;Sit to/from stand   Toilet Transfer: Stand-pivot;BSC/3in1;Rolling walker (2 wheels);Cueing for sequencing;Cueing for safety;Moderate assistance   Toileting- Clothing Manipulation and Hygiene: Moderate assistance;Sit to/from stand Toileting - Clothing Manipulation Details (indicate cue type and reason): needs BUE support in static standing, total A for pericare in standing Tub/ Shower Transfer: Walk-in shower;Minimal assistance;Stand-pivot;Shower seat;Rolling walker (2 wheels)     General ADL Comments: posterior lean. pivotal steps to access recliner. cues to avoid sitting prematurely. tends to leave walker at a distance. assist to steady pt and advance walker at times, particularly on turns.     Vision         Perception     Praxis      Pertinent Vitals/Pain Pain Assessment Pain Assessment: Faces Faces Pain Scale: Hurts a little bit Pain Location: mild grimacing during transfers, unspecified location Pain Descriptors / Indicators: Grimacing Pain Intervention(s): Monitored during session     Hand Dominance Right   Extremity/Trunk Assessment Upper Extremity Assessment Upper Extremity Assessment: Generalized weakness   Lower Extremity Assessment Lower Extremity Assessment: Defer to PT evaluation       Communication Communication Communication: HOH   Cognition Arousal/Alertness: Awake/alert Behavior During Therapy: Flat affect Overall Cognitive Status: History of cognitive impairments - at baseline                                  General Comments: dementia. able to follow one step commands consistently. some decreased inititation. daughter provided PLOF history.     General Comments  Pt on 2L O2 throughout session with the exception of being on RA to take pivotal steps to recliner. On RA about 3 minutes, SpO2 93-95; did reapply [place] at 2L at end of session. daughter present and involved throughout session.    Exercises     Shoulder Instructions      Home Living Family/patient expects to be discharged to:: Private residence Living Arrangements: Spouse/significant other Available Help at Discharge: Family;Available 24 hours/day Type of Home: House Home Access: Stairs to enter [employer] of Steps: 2 Entrance Stairs-Rails: Left Home Layout: One level     Bathroom Shower/Tub: Occupational psychologist: Standard Bathroom Accessibility: Yes How Accessible: Accessible via walker Home Equipment: [place] (2 wheels);Shower seat;Grab bars - tub/shower;BSC/3in1;Wheelchair - manual   Additional Comments: son bumps pt up stairs in wheelchair      Prior Functioning/Environment Prior Level of Function : Needs assist;History of Falls (last six months)  Cognitive Assist : Mobility (cognitive);ADLs (cognitive) Mobility (Cognitive): Intermittent cues ADLs (Cognitive): Intermittent cues Physical Assist : Mobility (physical);ADLs (physical) Mobility (physical): Bed mobility;Transfers;Gait;Stairs ADLs (physical): Grooming;Bathing;Dressing;Toileting Mobility Comments: pt ambulates household distances with use of walker, assistance from family to bump up steps in wheelchair. Pt utilizes a wheelchair for all community mobility          OT Problem List: Decreased strength;Impaired balance (sitting and/or standing);Decreased activity tolerance;Decreased cognition;Decreased safety awareness;Decreased knowledge of use of DME or AE;Decreased knowledge of  precautions;Cardiopulmonary status limiting activity;Pain      OT Treatment/Interventions: Self-care/ADL training;Therapeutic exercise;Energy conservation;DME and/or AE instruction;Therapeutic activities;Cognitive remediation/compensation;Patient/family  education;Balance training    OT Goals(Current goals can be found in the care plan section) Acute Rehab OT Goals Patient Stated Goal: pt and daughter: return home with current supports in place OT Goal Formulation: With patient/family Time For Goal Achievement: 10/02/22 Potential to Achieve Goals: Good ADL Goals Pt Will Perform Grooming: with min guard assist;sitting;with set-up;with caregiver independent in assisting Pt Will Perform Upper Body Dressing: with set-up;with min guard assist;sitting Pt Will Perform Lower Body Dressing: with min assist;sit to/from stand;with min guard assist;sitting/lateral leans Pt Will Transfer to Toilet: with min guard assist;ambulating Pt Will Perform Toileting - Clothing Manipulation and hygiene: with min guard assist;with min assist;sitting/lateral leans;sit to/from stand Additional ADL Goal #1: Pt will complete bed mobility at min guard assist level to prepare for EOB ADLs.  OT Frequency: Min 2X/week    Co-evaluation              AM-PAC OT "6 Clicks" Daily Activity     Outcome Measure Help from another person eating meals?: A Little Help from another person taking care of personal grooming?: A Little Help from another person toileting, which includes using toliet, bedpan, or urinal?: A Lot Help from another person bathing (including washing, rinsing, drying)?: A Lot Help from another person to put on and taking off regular upper body clothing?: A Little Help from another person to put on and taking off regular lower body clothing?: A Lot 6 Click Score: 15   End of Session Equipment Utilized During Treatment: Rolling walker (2 wheels);Oxygen (2L, briefly RA)  Activity Tolerance: Patient  tolerated treatment well Patient left: in chair;with call bell/phone within reach;with chair alarm set;with family/visitor present  OT Visit Diagnosis: Unsteadiness on feet (R26.81);Pain;Muscle weakness (generalized) (M62.81);Other symptoms and signs involving cognitive function                Time: 0919-0950 OT Time Calculation (min): 31 min Charges:  OT General Charges $OT Visit: 1 Visit OT Evaluation $OT Eval Moderate Complexity: 1 Mod OT Treatments $Self Care/Home Management : 8-22 mins  Tyrone Schimke, OT Acute Rehabilitation Services Office: [phone]   Hortencia Pilar 09/18/2022, 10:24 AM

## 2022-09-18 NOTE — Evaluation (Signed)
Physical Therapy Evaluation Patient Details Name: Paul [name] MRN: [id number] DOB: [birth year] Today's Date: 09/18/2022  History of Present Illness  88 yo male who presented 09/16/22 with vomiting/diarrhea. Pt with acute hypoxic respiratory failure and sepsis due to PNA. Pt with +cdiff.  PMH Dementia, HTN, Cva of L medulla 01/2022, bladder CA, C diff, hypoxic respitory faluire, CHF, back pain, impaired hearing, pneumonia, PE  Clinical Impression  Pt admitted with above diagnosis and presents to PT with functional limitations due to deficits listed below (See PT problem list). Pt needs skilled PT to maximize independence and safety to allow discharge to home with assist of family. Ambulation will be limited until diarrhea is under better control or a containment system is in place.         Recommendations for follow up therapy are one component of a multi-disciplinary discharge planning process, led by the attending physician.  Recommendations may be updated based on patient status, additional functional criteria and insurance authorization.  Follow Up Recommendations Home health PT      Assistance Recommended at Discharge Frequent or constant Supervision/Assistance  Patient can return home with the following  A little help with walking and/or transfers;A little help with bathing/dressing/bathroom;Direct supervision/assist for medications management;Assist for transportation;Help with stairs or ramp for entrance    Equipment Recommendations None recommended by PT  Recommendations for Other Services       Functional Status Assessment Patient has had a recent decline in their functional status and demonstrates the ability to make significant improvements in function in a reasonable and predictable amount of time.     Precautions / Restrictions Precautions Precautions: Fall Restrictions Weight Bearing Restrictions: No      Mobility  Bed Mobility               General bed  mobility comments: Pt up in chair    Transfers Overall transfer level: Needs assistance Equipment used: Rolling walker (2 wheels) Transfers: Sit to/from Stand Sit to Stand: Mod assist, Min assist           General transfer comment: Mod assist to bring hips up and correct posterior bias when rising from recliner. Min assist and decr posterior bias when rising from bsc.    Ambulation/Gait Ambulation/Gait assistance: Min assist Gait Distance (Feet): 20 Feet Assistive device: Rolling walker (2 wheels) Gait Pattern/deviations: Step-through pattern, Decreased step length - right, Decreased step length - left, Shuffle, Trunk flexed Gait velocity: decr Gait velocity interpretation: <1.31 ft/sec, indicative of household ambulator   General Gait Details: Assist for balance and moderate verbal cues to keep feet closer to walker. This improved when IV pole behind pt out of sight instead of in front of walker. Distance limited by bowel incontinence.  Stairs            Wheelchair Mobility    Modified Rankin (Stroke Patients Only)       Balance Overall balance assessment: Needs assistance Sitting-balance support: Feet supported, Bilateral upper extremity supported Sitting balance-Leahy Scale: Poor Sitting balance - Comments: UE support   Standing balance support: Bilateral upper extremity supported, During functional activity, Reliant on assistive device for balance Standing balance-Leahy Scale: Poor Standing balance comment: walker and min assist for static standing                             Pertinent Vitals/Pain Pain Assessment Pain Assessment: No/denies pain    Home Living Family/patient expects to be discharged  to:: Private residence Living Arrangements: Spouse/significant other Available Help at Discharge: Family;Available 24 hours/day Type of Home: House Home Access: Stairs to enter Entrance Stairs-Rails: Left Entrance Stairs-Number of Steps: 2    Home Layout: One level Home Equipment: Conservation officer, nature (2 wheels);Shower seat;Grab bars - tub/shower;BSC/3in1;Wheelchair - manual Additional Comments: son bumps pt up stairs in wheelchair    Prior Function Prior Level of Function : Needs assist;History of Falls (last six months)  Cognitive Assist : Mobility (cognitive);ADLs (cognitive) Mobility (Cognitive): Intermittent cues ADLs (Cognitive): Intermittent cues Physical Assist : Mobility (physical);ADLs (physical) Mobility (physical): Bed mobility;Transfers;Gait;Stairs ADLs (physical): Grooming;Bathing;Dressing;Toileting Mobility Comments: pt ambulates household distances with use of walker, assistance from family to bump up steps in wheelchair. Pt utilizes a wheelchair for all community mobility       Hand Dominance   Dominant Hand: Right    Extremity/Trunk Assessment   Upper Extremity Assessment Upper Extremity Assessment: Defer to OT evaluation    Lower Extremity Assessment Lower Extremity Assessment: Generalized weakness       Communication   Communication: HOH  Cognition Arousal/Alertness: Awake/alert Behavior During Therapy: Flat affect Overall Cognitive Status: History of cognitive impairments - at baseline                                 General Comments: Follows 1 step commands with incr time.        General Comments General comments (skin integrity, edema, etc.): VSS on RA. SpO2 >92% throughout    Exercises     Assessment/Plan    PT Assessment Patient needs continued PT services  PT Problem List Decreased strength;Decreased activity tolerance;Decreased balance;Decreased mobility       PT Treatment Interventions DME instruction;Gait training;Functional mobility training;Therapeutic activities;Therapeutic exercise;Balance training;Patient/family education    PT Goals (Current goals can be found in the Care Plan section)  Acute Rehab PT Goals Patient Stated Goal: go home PT Goal  Formulation: With patient/family Time For Goal Achievement: 10/02/22 Potential to Achieve Goals: Good    Frequency Min 3X/week     Co-evaluation               AM-PAC PT "6 Clicks" Mobility  Outcome Measure Help needed turning from your back to your side while in a flat bed without using bedrails?: A Little Help needed moving from lying on your back to sitting on the side of a flat bed without using bedrails?: A Little Help needed moving to and from a bed to a chair (including a wheelchair)?: A Lot Help needed standing up from a chair using your arms (e.g., wheelchair or bedside chair)?: A Lot Help needed to walk in hospital room?: A Little Help needed climbing 3-5 steps with a railing? : Total 6 Click Score: 14    End of Session   Activity Tolerance: Patient tolerated treatment well Patient left: in chair;with call bell/phone within reach;with chair alarm set;with family/visitor present;with nursing/sitter in room Nurse Communication: Mobility status PT Visit Diagnosis: Unsteadiness on feet (R26.81);Other abnormalities of gait and mobility (R26.89);Muscle weakness (generalized) (M62.81)    Time: 4403-4742 PT Time Calculation (min) (ACUTE ONLY): 40 min   Charges:   PT Evaluation $PT Eval Moderate Complexity: 1 Mod PT Treatments $Gait Training: 23-37 mins        [place] [facility] [place] 09/18/2022, 11:49 AM

## 2022-09-18 NOTE — Consult Note (Signed)
Referring Provider: Dr. Waldron Labs, [facility] Primary Care Physician:  Crist Infante, MD Primary Gastroenterologist:  Dr. Henrene Pastor  Reason for Consultation:  Paul [name]  HPI: Paul [name] is a 88 y.o. male with medical history significant for dementia, peripheral artery disease, history of bladder cancer, hypertension, hyperlipidemia, CVA, recent C. difficile infection June 20, 2022, history of pulmonary embolism on Eliquis, who presented to [facility] ED from home due to vomiting and diarrhea.  He was initially treated with Dificid (seen by ID in July) but then when that was completed his PCP prescribed Vanomycin taper, which he just completed.  Cdiff PCR is positive here.  WBC count normal.  CT scan of the abdomen and pelvis with contrast showed the following:   "No colonic wall thickening or inflammatory changes in this patient with recent history of C diff. Sigmoid diverticulosis, without evidence of diverticulitis."   Is on vancomycin four times daily again here as well as Florastor.  ID has been consulted again as well,  Past Medical History:  Diagnosis Date   Back pain    Bladder tumor    BPH (benign prostatic hyperplasia)    Clostridioides difficile infection 06/10/2022   Hyperlipidemia    Hypertension    Impaired hearing BILATERAL HEARING AIDS   Personal history of gastric ulcer    Pneumonia 06/10/2022    Past Surgical History:  Procedure Laterality Date   APPENDECTOMY  1957   CATARACT EXTRACTION W/ INTRAOCULAR LENS  IMPLANT, BILATERAL     CYSTOSCOPY WITH BIOPSY  03/08/2012   Procedure: CYSTOSCOPY WITH BIOPSY;  Surgeon: Claybon Jabs, MD;  Location: [facility];  Service: Urology;  Laterality: N/A;  gyrus   TONSILLECTOMY  CHILD   TRANSURETHRAL INCISION OF PROSTATE  03/08/2012   Procedure: TRANSURETHRAL INCISION OF THE PROSTATE (TUIP);  Surgeon: Claybon Jabs, MD;  Location: [facility];  Service: Urology;  Laterality: N/A;   TRANSURETHRAL RESECTION OF  BLADDER TUMOR  01/26/2012   Procedure: TRANSURETHRAL RESECTION OF BLADDER TUMOR (TURBT);  Surgeon: Claybon Jabs, MD;  Location: [facility];  Service: Urology;  Laterality: N/A;  GYRUS MYTOMICIN C     Prior to Admission medications   Medication Sig Start Date End Date Taking? Authorizing Provider  acetaminophen (TYLENOL) 500 MG tablet Take 500-1,000 mg by mouth daily as needed for mild pain or headache.   Yes [provider]  apixaban (ELIQUIS) 5 MG TABS tablet Take 2 tablets ('10mg'$ ) twice daily for 7 days, then 1 tablet ('5mg'$ ) twice daily Patient taking differently: Take 5 mg by mouth 2 (two) times daily. 07/31/22  Yes Alma Friendly, MD  donepezil (ARICEPT) 5 MG tablet Take 5 mg by mouth daily.   Yes [provider]  Ensure (ENSURE) Take 237 mLs by mouth daily.   Yes [provider]  escitalopram (LEXAPRO) 20 MG tablet Take 20 mg by mouth daily. 01/24/20  Yes [provider]  ezetimibe (ZETIA) 10 MG tablet Take 10 mg by mouth daily.   Yes [provider]  isosorbide mononitrate (IMDUR) 60 MG 24 hr tablet Take 1 tablet (60 mg total) by mouth every morning. do not give if systolic blood pressure < 130 Patient taking differently: Take 60 mg by mouth daily. 02/01/22  Yes Patrecia Pour, MD  loperamide (IMODIUM) 2 MG capsule Take 2 mg by mouth once.   Yes [provider]  Probiotic Product (PROBIOTIC PO) Take 1 tablet by mouth daily.   Yes [provider]  vancomycin (VANCOCIN) 125 MG capsule Take 125 mg by mouth every other day. Patient not taking: Reported on 09/16/2022 07/04/22   [provider]  simvastatin (ZOCOR) 20 MG tablet Take 20 mg by mouth every evening.  02/27/12  [provider]    Current Facility-Administered Medications  Medication Dose Route Frequency Provider Last Rate Last Admin   0.9 %  sodium chloride infusion   Intravenous PRN Kayleen Memos, DO   Stopped at 09/18/22 0028    acetaminophen (TYLENOL) tablet 650 mg  650 mg Oral Q6H PRN Kayleen Memos, DO       apixaban (ELIQUIS) tablet 5 mg  5 mg Oral BID Hall, Carole N, DO   5 mg at 09/18/22 1021   ceFEPIme (MAXIPIME) 2 g in sodium chloride 0.9 % 100 mL IVPB  2 g Intravenous Q12H Tegeler, Gwenyth Allegra, MD 200 mL/hr at 09/18/22 1028 2 g at 09/18/22 1028   donepezil (ARICEPT) tablet 5 mg  5 mg Oral Daily Elgergawy, Silver Huguenin, MD   5 mg at 09/18/22 1020   escitalopram (LEXAPRO) tablet 20 mg  20 mg Oral Daily Irene Pap N, DO   20 mg at 09/18/22 1022   ezetimibe (ZETIA) tablet 10 mg  10 mg Oral Daily Irene Pap N, DO   10 mg at 09/18/22 1022   feeding supplement (ENSURE ENLIVE / ENSURE PLUS) liquid 237 mL  237 mL Oral TID BM Elgergawy, Silver Huguenin, MD   237 mL at 09/18/22 0910   influenza vaccine adjuvanted (FLUAD) injection 0.5 mL  0.5 mL Intramuscular Tomorrow-1000 Hall, Carole N, DO       insulin aspart (novoLOG) injection 0-5 Units  0-5 Units Subcutaneous QHS Hall, Carole N, DO       insulin aspart (novoLOG) injection 0-9 Units  0-9 Units Subcutaneous TID WC Irene Pap N, DO   1 Units at 09/17/22 1214   menthol-cetylpyridinium (CEPACOL) lozenge 3 mg  1 lozenge Oral PRN Elgergawy, Silver Huguenin, MD       ondansetron (ZOFRAN) injection 4 mg  4 mg Intravenous Q6H PRN Irene Pap N, DO   4 mg at 09/17/22 0230   phenol (CHLORASEPTIC) mouth spray 1 spray  1 spray Mouth/Throat PRN Elgergawy, Silver Huguenin, MD       saccharomyces boulardii (FLORASTOR) capsule 250 mg  250 mg Oral BID Hall, Carole N, DO   250 mg at 09/18/22 1023   vancomycin (VANCOCIN) capsule 125 mg  125 mg Oral QID Elgergawy, Silver Huguenin, MD   125 mg at 09/18/22 1020   vancomycin (VANCOREADY) IVPB 750 mg/150 mL  750 mg Intravenous Q24H Tegeler, Gwenyth Allegra, MD   Stopped at 09/18/22 0214    Allergies as of 09/16/2022 - Review Complete 09/16/2022  Allergen Reaction Noted   Livalo [pitavastatin] Other (See Comments) 05/30/2014   Haldol [haloperidol] Other (See  Comments) 07/29/2022   Other Diarrhea and Other (See Comments) 06/29/2020    Family History  Problem Relation Age of Onset   Kidney cancer Mother    Hypertension Father     Social History   Socioeconomic History   Marital status: Married    Spouse name: Not on file   Number of children: 3   Years of education: Not on file   Highest education level: Not on file  Occupational History   Not on file  Tobacco Use   Smoking status: Former    Years: 30.00    Types: Cigarettes  Quit date: 01/20/1981    Years since quitting: 41.6   Smokeless tobacco: Never  Substance and Sexual Activity   Alcohol use: No   Drug use: No   Sexual activity: Not on file  Other Topics Concern   Not on file  Social History Narrative   Not on file   Social Determinants of Health   Financial Resource Strain: Not on file  Food Insecurity: Not on file  Transportation Needs: Not on file  Physical Activity: Not on file  Stress: Not on file  Social Connections: Not on file  Intimate Partner Violence: Not on file   Review of Systems: ROS is O/W negative except as mentioned in HPI.  Physical Exam: Vital signs in last 24 hours: Temp:  [98.5 F (36.9 C)-99 F (37.2 C)] 98.7 F (37.1 C) (10/05 0307) Pulse Rate:  [64-76] 67 (10/05 0307) Resp:  [18-22] 20 (10/05 0307) BP: (104-131)/(54-65) 128/55 (10/05 0307) SpO2:  [94 %-99 %] 96 % (10/05 0307) Last BM Date : 09/18/22 General:  Alert, Well-developed, well-nourished, pleasant and cooperative in NAD Head:  Normocephalic and atraumatic. Eyes:  Sclera clear, no icterus.  Conjunctiva pink. Ears:  Normal auditory acuity. Mouth:  No deformity or lesions.   Lungs:  Clear throughout to auscultation.  No wheezes, crackles, or rhonchi.  Heart:  Regular rate and rhythm; no murmurs, clicks, rubs, or gallops. Abdomen:  Soft, non-distended.  BS present.  Non-tender.   Msk:  Symmetrical without gross deformities. Pulses:  Normal pulses noted. Extremities:   Without clubbing or edema. Neurologic:  Grossly normal neurologically. Skin:  Intact without significant lesions or rashes. Psych:  Alert and cooperative. Normal mood and affect.  Intake/Output from previous day: 10/04 0701 - 10/05 0700 In: 579.8 [P.O.:240; I.V.:1.2; IV Piggyback:338.6] Out: 551 [Urine:550; Stool:1]  Lab Results: Recent Labs    09/16/22 2055 09/17/22 0350 09/18/22 0210  WBC 14.2* 7.5 7.4  HGB 13.4 12.4* 10.1*  HCT 43.2 39.3 30.8*  PLT 351 253 189   BMET Recent Labs    09/16/22 2055 09/17/22 0350 09/18/22 0210  NA 140 136 135  K 3.9 4.1 3.7  CL 107 107 106  CO2 20* 22 22  GLUCOSE 185* 135* 85  BUN '21 19 21  '$ CREATININE 1.20 1.27* 1.23  CALCIUM 9.2 8.5* 8.0*   LFT Recent Labs    09/17/22 0350  PROT 5.7*  ALBUMIN 3.2*  AST 23  ALT 14  ALKPHOS 58  BILITOT 0.6   PT/INR Recent Labs    09/16/22 2145  LABPROT 16.4*  INR 1.3*   Studies/Results: CT Angio Chest Pulmonary Embolism (PE) W or WO Contrast  Result Date: 09/16/2022 CLINICAL DATA:  Vomiting, diarrhea, fever, recent C diff, history of PE EXAM: CT ANGIOGRAPHY CHEST CT ABDOMEN AND PELVIS WITH CONTRAST TECHNIQUE: Multidetector CT imaging of the chest was performed using the standard protocol during bolus administration of intravenous contrast. Multiplanar CT image reconstructions and MIPs were obtained to evaluate the vascular anatomy. Multidetector CT imaging of the abdomen and pelvis was performed using the standard protocol during bolus administration of intravenous contrast. RADIATION DOSE REDUCTION: This exam was performed according to the departmental dose-optimization program which includes automated exposure control, adjustment of the mA and/or kV according to patient size and/or use of iterative reconstruction technique. CONTRAST:  155m OMNIPAQUE IOHEXOL 350 MG/ML SOLN COMPARISON:  CTA chest dated 07/29/2022. CT chest abdomen pelvis dated 06/20/2022. FINDINGS: CTA CHEST FINDINGS  Cardiovascular: Satisfactory opacification the bilateral pulmonary arteries to the segmental  level. No evidence of pulmonary embolism. Specifically, the prior segmental left lower lobe pulmonary embolism has resolved. Although not tailored for evaluation of the thoracic aorta, there is no evidence thoracic aortic aneurysm or dissection. Atherosclerotic calcifications of the arch. The heart is normal in size.  No pericardial effusion. Mild coronary sclerosis of the LAD and right coronary artery. Mediastinum/Nodes: No suspicious mediastinal lymphadenopathy. Visualized thyroid is unremarkable. Lungs/Pleura: Rounded atelectasis/collapse in the left lower lobe. Associated moderate chronic left pleural effusion with pleural thickening, similar to the prior. Mild patchy right lower lobe opacity, likely atelectasis. New patchy/nodular opacities in the posterior left upper lobe (series 7/image 37), suggesting mild infection/pneumonia. Evaluation lung parenchyma is constrained by respiratory motion. Within that constraint, there are no suspicious pulmonary nodules. Mild centrilobular emphysematous changes, upper lobe predominant. No pneumothorax. Musculoskeletal: Moderate degenerative changes of the thoracic spine. Review of the MIP images confirms the above findings. CT ABDOMEN and PELVIS FINDINGS Motion degraded images. Hepatobiliary: 10 mm cyst versus hemangioma in the central right liver (series 3/image 26), benign. Gallbladder is unremarkable. No intrahepatic or extrahepatic duct dilatation. Pancreas: Within normal limits. Spleen: Within normal limits. Adrenals/Urinary Tract: Adrenal glands are within normal limits. Right renal scarring. Left kidney is within normal limits. No hydronephrosis. Bladder is mildly thick-walled although underdistended. Stomach/Bowel: Stomach is within normal limits. No evidence of bowel obstruction. Appendix is not discretely visualized. Sigmoid diverticulosis, without evidence of  diverticulitis. No colonic wall thickening or inflammatory changes in this patient with recent history of C diff. Vascular/Lymphatic: No evidence of abdominal aortic aneurysm. Atherosclerotic calcifications of the abdominal aorta and branch vessels. No suspicious abdominopelvic lymphadenopathy. Reproductive: Prostatomegaly, suggesting BPH. Other: No abdominopelvic ascites. Tiny fat containing left inguinal hernia. Musculoskeletal: Moderate degenerative changes of the lumbar spine. Review of the MIP images confirms the above findings. IMPRESSION: No evidence of pulmonary embolism. Specifically, the prior segmental left lower lobe pulmonary embolism has resolved. New patchy/nodular opacities in the posterior left upper lobe, suggesting mild infection/pneumonia. Rounded atelectasis/collapse in the left lower lobe. Associated moderate chronic left pleural effusion with pleural thickening. Both findings are chronic. No colonic wall thickening or inflammatory changes in this patient with recent history of C diff. Sigmoid diverticulosis, without evidence of diverticulitis. Additional stable ancillary findings as above. Aortic Atherosclerosis (ICD10-I70.0) and Emphysema (ICD10-J43.9). Electronically Signed   By: Julian Hy M.D.   On: 09/16/2022 23:01   CT ABDOMEN PELVIS W CONTRAST  Result Date: 09/16/2022 CLINICAL DATA:  Vomiting, diarrhea, fever, recent C diff, history of PE EXAM: CT ANGIOGRAPHY CHEST CT ABDOMEN AND PELVIS WITH CONTRAST TECHNIQUE: Multidetector CT imaging of the chest was performed using the standard protocol during bolus administration of intravenous contrast. Multiplanar CT image reconstructions and MIPs were obtained to evaluate the vascular anatomy. Multidetector CT imaging of the abdomen and pelvis was performed using the standard protocol during bolus administration of intravenous contrast. RADIATION DOSE REDUCTION: This exam was performed according to the departmental dose-optimization  program which includes automated exposure control, adjustment of the mA and/or kV according to patient size and/or use of iterative reconstruction technique. CONTRAST:  163m OMNIPAQUE IOHEXOL 350 MG/ML SOLN COMPARISON:  CTA chest dated 07/29/2022. CT chest abdomen pelvis dated 06/20/2022. FINDINGS: CTA CHEST FINDINGS Cardiovascular: Satisfactory opacification the bilateral pulmonary arteries to the segmental level. No evidence of pulmonary embolism. Specifically, the prior segmental left lower lobe pulmonary embolism has resolved. Although not tailored for evaluation of the thoracic aorta, there is no evidence thoracic aortic aneurysm or dissection. Atherosclerotic calcifications of  the arch. The heart is normal in size.  No pericardial effusion. Mild coronary sclerosis of the LAD and right coronary artery. Mediastinum/Nodes: No suspicious mediastinal lymphadenopathy. Visualized thyroid is unremarkable. Lungs/Pleura: Rounded atelectasis/collapse in the left lower lobe. Associated moderate chronic left pleural effusion with pleural thickening, similar to the prior. Mild patchy right lower lobe opacity, likely atelectasis. New patchy/nodular opacities in the posterior left upper lobe (series 7/image 37), suggesting mild infection/pneumonia. Evaluation lung parenchyma is constrained by respiratory motion. Within that constraint, there are no suspicious pulmonary nodules. Mild centrilobular emphysematous changes, upper lobe predominant. No pneumothorax. Musculoskeletal: Moderate degenerative changes of the thoracic spine. Review of the MIP images confirms the above findings. CT ABDOMEN and PELVIS FINDINGS Motion degraded images. Hepatobiliary: 10 mm cyst versus hemangioma in the central right liver (series 3/image 26), benign. Gallbladder is unremarkable. No intrahepatic or extrahepatic duct dilatation. Pancreas: Within normal limits. Spleen: Within normal limits. Adrenals/Urinary Tract: Adrenal glands are within  normal limits. Right renal scarring. Left kidney is within normal limits. No hydronephrosis. Bladder is mildly thick-walled although underdistended. Stomach/Bowel: Stomach is within normal limits. No evidence of bowel obstruction. Appendix is not discretely visualized. Sigmoid diverticulosis, without evidence of diverticulitis. No colonic wall thickening or inflammatory changes in this patient with recent history of C diff. Vascular/Lymphatic: No evidence of abdominal aortic aneurysm. Atherosclerotic calcifications of the abdominal aorta and branch vessels. No suspicious abdominopelvic lymphadenopathy. Reproductive: Prostatomegaly, suggesting BPH. Other: No abdominopelvic ascites. Tiny fat containing left inguinal hernia. Musculoskeletal: Moderate degenerative changes of the lumbar spine. Review of the MIP images confirms the above findings. IMPRESSION: No evidence of pulmonary embolism. Specifically, the prior segmental left lower lobe pulmonary embolism has resolved. New patchy/nodular opacities in the posterior left upper lobe, suggesting mild infection/pneumonia. Rounded atelectasis/collapse in the left lower lobe. Associated moderate chronic left pleural effusion with pleural thickening. Both findings are chronic. No colonic wall thickening or inflammatory changes in this patient with recent history of C diff. Sigmoid diverticulosis, without evidence of diverticulitis. Additional stable ancillary findings as above. Aortic Atherosclerosis (ICD10-I70.0) and Emphysema (ICD10-J43.9). Electronically Signed   By: Julian Hy M.D.   On: 09/16/2022 23:01   DG Abd 2 Views  Result Date: 09/16/2022 CLINICAL DATA:  Nausea and vomiting EXAM: ABDOMEN - 2 VIEW COMPARISON:  None Available. FINDINGS: There is a large air-fluid level in the stomach. No other dilated bowel loops are seen. Air seen to the level the rectum. There are phleboliths in the pelvis. There is a small left pleural effusion. Degenerative changes  affect the spine. IMPRESSION: There is a large air-fluid level in the stomach. No other dilated bowel loops are seen. Electronically Signed   By: Ronney Asters M.D.   On: 09/16/2022 22:10   DG Chest Port 1 View  Result Date: 09/16/2022 CLINICAL DATA:  Vomiting, diarrhea. EXAM: PORTABLE CHEST 1 VIEW COMPARISON:  July 29, 2022. FINDINGS: The heart size and mediastinal contours are within normal limits. Mild right basilar atelectasis or scarring is noted. Stable left pleural effusion is noted with associated left lower lobe pneumonia or atelectasis. The visualized skeletal structures are unremarkable. IMPRESSION: Stable left pleural effusion is noted with associated left lower lobe pneumonia or atelectasis. Mild right basilar atelectasis or scarring is noted. Electronically Signed   By: Marijo Conception M.D.   On: 09/16/2022 21:24    IMPRESSION:  *Recurrent vs refractory Cdiff: Initially diagnosed in July.  ID saw him at that time and elected to treat him with Dificid but  this is following treatment with Augmentin, Zosyn, and Bactrim for other infections.  Then apparently took a vancomycin taper from his PCP that was just finished recently. *Dysphagia:  Concerns with choking and coughing.  Speech to eval.  They saw him back in June. *History of PE on Eliquis *Dementia  PLAN: -Will await ID input as well as they have also been consulted.  Looks like Dificid is going to be expensive.  Is on Vancomycin 125 mg four times daily and will likely need another taper.  Zinplava antibody infusion as an outpatient would be an option as well as the oral pill administration for fecal transplant, VOWST. -Continue Florastor BID. -No PPI, use H2 blocker instead. -Judicious about abx going forward.  Paul [name]. Paul [name]  09/18/2022, 12:48 PM

## 2022-09-18 NOTE — Progress Notes (Signed)
Speech Language Pathology Treatment:    Patient Details Name: Paul [name] MRN: [id number] DOB: 09/28/1930 Today's Date: 09/18/2022 Time:  -       Pt has history of chronic dyshpagia and is at high aspiration risk- see bedside swallow assessment 10/4. Swallow assessment re-ordered. Therapist discussed/reviewed pt's dysphagia with daughter, this morning, and she agreed that repeating MBS would not be very beneficial at present. She was able to state his precautions (chopped meats, extra gravy is able, small bites and sips) and willing to accept aspiration risk at present time and attempt to mitigate but not eliminate risk.                     Houston Siren  09/18/2022, 1:02 PM

## 2022-09-18 NOTE — Progress Notes (Signed)
PROGRESS NOTE    Paul [name]  ESP:[id number] DOB: 1930/09/13 DOA: 09/16/2022 PCP: Crist Infante, MD   Chief Complaint  Patient presents with   Emesis / Diarrhea    Sepsis    Brief Narrative:    KEMO SPRUCE is a 88 y.o. male with medical history significant for dementia, peripheral artery disease, history of bladder cancer, hypertension, hyperlipidemia, CVA, recent C. difficile infection June 20, 2022, history of pulmonary embolism on Eliquis, who presented to [name] ED from home due to vomiting and diarrhea.  -His work-up was significant for left lung pneumonia, felt to be secondary to aspiration and profuse diarrhea, C. difficile is unfortunately positive again.   Assessment & Plan:   Principal Problem:   Acute respiratory failure with hypoxia (HCC)   Acute hypoxic respiratory failure  Sepsis present on admission - Sepsis patient on admission, febrile 101, elevated lactic acid, tachypneic due to pneumonia, likely aspiration  -No oxygen requirement, initial saturation 78 to 82% on room air, currently on 4 L nasal cannula, improving oxygen requirement, down to 2 L nasal cannula today -Seen by SLP, moderate risk for aspiration -Was encouraged to use incentive spirometry and flutter valve. -He is on cefepime, appears to be improving, now he is with C. difficile again I will discontinue cefepime.  And change to Unasyn.   Intractable nausea and diarrhea C. difficile diarrhea -Multiple episodes, he was treated with Dificid initially, and then when has been on p.o. vancomycin, GI and ID has been consulted for persistent C. difficile infection despite multiple antibiotic courses.  Dysphagia, worsened with liquids Please see above discussion  Hypophosphatemia -Repleted, monitor closely   Generalized weakness Failure to thrive PT/OT consulted   History of PE on Eliquis Resume home Eliquis       DVT prophylaxis: Eliquis Code Status: Full code Family Communication: Daughter  at bedside Disposition:   Status is: Inpatient    Consultants:  GI  ID  Subjective:  Patient reports he is having raspy voice, profuse diarrhea overnight, still with poor appetite, CBG low this morning  Objective: Vitals:   09/17/22 1605 09/17/22 1955 09/17/22 2335 09/18/22 0307  BP: (!) 117/54 (!) 104/55 (!) 131/59 (!) 128/55  Pulse: 70 64 66 67  Resp: '18 19 19 20  '$ Temp: 98.7 F (37.1 C) 99 F (37.2 C) 98.5 F (36.9 C) 98.7 F (37.1 C)  TempSrc: Oral Oral Oral Oral  SpO2: 94% 96% 99% 96%  Weight:        Intake/Output Summary (Last 24 hours) at 09/18/2022 1448 Last data filed at 09/18/2022 0400 Gross per 24 hour  Intake 579.8 ml  Output 551 ml  Net 28.8 ml   Filed Weights   09/16/22 2100  Weight: 66.2 kg    Examination:  Awake Alert, Oriented X 3, No new F.N deficits, Normal affect Symmetrical Chest wall movement, Good air movement bilaterally, CTAB RRR,No Gallops,Rubs or new Murmurs, No Parasternal Heave +ve B.Sounds, Abd Soft, No tenderness, No rebound - guarding or rigidity. No Cyanosis, Clubbing or edema, No new Rash or bruise      Data Reviewed: I have personally reviewed following labs and imaging studies  CBC: Recent Labs  Lab 09/16/22 2055 09/17/22 0350 09/18/22 0210  WBC 14.2* 7.5 7.4  NEUTROABS 12.7* 6.6  --   HGB 13.4 12.4* 10.1*  HCT 43.2 39.3 30.8*  MCV 90.9 89.9 87.5  PLT 351 253 633    Basic Metabolic Panel: Recent Labs  Lab 09/16/22 2055 09/17/22 0350  09/18/22 0210  NA 140 136 135  K 3.9 4.1 3.7  CL 107 107 106  CO2 20* 22 22  GLUCOSE 185* 135* 85  BUN '21 19 21  '$ CREATININE 1.20 1.27* 1.23  CALCIUM 9.2 8.5* 8.0*  MG  --  1.7  --   PHOS  --  1.7* 2.5    GFR: Estimated Creatinine Clearance: 37.4 mL/min (by C-G formula based on SCr of 1.23 mg/dL).  Liver Function Tests: Recent Labs  Lab 09/16/22 2055 09/17/22 0350  AST 22 23  ALT 14 14  ALKPHOS 75 58  BILITOT 0.7 0.6  PROT 6.7 5.7*  ALBUMIN 3.9 3.2*     CBG: Recent Labs  Lab 09/18/22 0024 09/18/22 0444 09/18/22 0809 09/18/22 0902 09/18/22 1319  GLUCAP 73 76 64* 107* 135*     Recent Results (from the past 240 hour(s))  Urine Culture     Status: Abnormal   Collection Time: 09/16/22  8:53 PM   Specimen: In/Out Cath Urine  Result Value Ref Range Status   Specimen Description IN/OUT CATH URINE  Final   Special Requests   Final    NONE Performed at [facility], [place] [address], [place], [place] [address]    Culture MULTIPLE SPECIES PRESENT, SUGGEST RECOLLECTION (A)  Final   Report Status 09/17/2022 FINAL  Final  Blood culture (routine x 2)     Status: None (Preliminary result)   Collection Time: 09/16/22  8:58 PM   Specimen: BLOOD  Result Value Ref Range Status   Specimen Description BLOOD BLOOD LEFT HAND  Final   Special Requests   Final    BOTTLES DRAWN AEROBIC AND ANAEROBIC Blood Culture adequate volume   Culture   Final    NO GROWTH 2 DAYS Performed at [facility], [place] [address], [place], [place] [address]    Report Status PENDING  Incomplete  Blood culture (routine x 2)     Status: None (Preliminary result)   Collection Time: 09/16/22  9:39 PM   Specimen: BLOOD  Result Value Ref Range Status   Specimen Description BLOOD RIGHT ANTECUBITAL  Final   Special Requests   Final    BOTTLES DRAWN AEROBIC AND ANAEROBIC Blood Culture results may not be optimal due to an excessive volume of blood received in culture bottles   Culture   Final    NO GROWTH 2 DAYS Performed at [facility], [place] [address], [place], [place] [address]    Report Status PENDING  Incomplete  Resp Panel by RT-PCR (Flu A&B, Covid) Urine, Clean Catch     Status: None   Collection Time: 09/16/22  9:48 PM   Specimen: Urine, Clean Catch; Nasal Swab  Result Value Ref Range Status   SARS Coronavirus 2 by RT PCR NEGATIVE NEGATIVE Final    Comment: (NOTE) SARS-CoV-2 target nucleic acids are NOT DETECTED.  The SARS-CoV-2 RNA is  generally detectable in upper respiratory specimens during the acute phase of infection. The lowest concentration of SARS-CoV-2 viral copies this assay can detect is 138 copies/mL. A negative result does not preclude SARS-Cov-2 infection and should not be used as the sole basis for treatment or other patient management decisions. A negative result may occur with  improper specimen collection/handling, submission of specimen other than nasopharyngeal swab, presence of viral mutation(s) within the areas targeted by this assay, and inadequate number of viral copies(<138 copies/mL). A negative result must be combined with clinical observations, patient history, and epidemiological  information. The expected result is Negative.  Fact Sheet for Patients:  EntrepreneurPulse.com.au  Fact Sheet for Healthcare Providers:  IncredibleEmployment.be  This test is no t yet approved or cleared by the [place] FDA and  has been authorized for detection and/or diagnosis of SARS-CoV-2 by FDA under an Emergency Use Authorization (EUA). This EUA will remain  in effect (meaning this test can be used) for the duration of the COVID-19 declaration under Section 564(b)(1) of the Act, 21 U.S.C.section 360bbb-3(b)(1), unless the authorization is terminated  or revoked sooner.       Influenza A by PCR NEGATIVE NEGATIVE Final   Influenza B by PCR NEGATIVE NEGATIVE Final    Comment: (NOTE) The Xpert Xpress SARS-CoV-2/FLU/RSV plus assay is intended as an aid in the diagnosis of influenza from Nasopharyngeal swab specimens and should not be used as a sole basis for treatment. Nasal washings and aspirates are unacceptable for Xpert Xpress SARS-CoV-2/FLU/RSV testing.  Fact Sheet for Patients: EntrepreneurPulse.com.au  Fact Sheet for Healthcare Providers: IncredibleEmployment.be  This test is not yet approved or cleared by the [place] FDA and has been authorized for detection and/or diagnosis of SARS-CoV-2 by FDA under an Emergency Use Authorization (EUA). This EUA will remain in effect (meaning this test can be used) for the duration of the COVID-19 declaration under Section 564(b)(1) of the Act, 21 U.S.C. section 360bbb-3(b)(1), unless the authorization is terminated or revoked.  Performed at [facility], [place] [address] [place], [place] [address]   C Difficile Quick Screen w PCR reflex     Status: Abnormal   Collection Time: 09/16/22 11:55 PM   Specimen: STOOL  Result Value Ref Range Status   C Diff antigen POSITIVE (A) NEGATIVE Final   C Diff toxin NEGATIVE NEGATIVE Final   C Diff interpretation Results are indeterminate. See PCR results.  Final    Comment: Performed at [facility], [place] [address] [place], [place] [address]  C. Diff by PCR, Reflexed     Status: Abnormal   Collection Time: 09/16/22 11:55 PM  Result Value Ref Range Status   Toxigenic C. Difficile by PCR POSITIVE (A) NEGATIVE Final    Comment: Positive for toxigenic C. difficile with little to no toxin production. Only treat if clinical presentation suggests symptomatic illness. Performed at [facility], [place] [address], [place], [place] [address]   Respiratory (~20 pathogens) panel by PCR     Status: None   Collection Time: 09/18/22 11:29 AM   Specimen: Nasopharyngeal Swab; Respiratory  Result Value Ref Range Status   Adenovirus NOT DETECTED NOT DETECTED Final   Coronavirus 229E NOT DETECTED NOT DETECTED Final    Comment: (NOTE) The Coronavirus on the Respiratory Panel, DOES NOT test for the novel  Coronavirus (2019 nCoV)    Coronavirus HKU1 NOT DETECTED NOT DETECTED Final   Coronavirus NL63 NOT DETECTED NOT DETECTED Final   Coronavirus OC43 NOT DETECTED NOT DETECTED Final   Metapneumovirus NOT DETECTED NOT DETECTED Final   Rhinovirus / Enterovirus NOT DETECTED NOT DETECTED Final   Influenza A NOT DETECTED NOT  DETECTED Final   Influenza B NOT DETECTED NOT DETECTED Final   Parainfluenza Virus 1 NOT DETECTED NOT DETECTED Final   Parainfluenza Virus 2 NOT DETECTED NOT DETECTED Final   Parainfluenza Virus 3 NOT DETECTED NOT DETECTED Final   Parainfluenza Virus 4 NOT DETECTED NOT DETECTED Final   Respiratory Syncytial Virus NOT DETECTED NOT DETECTED Final   Bordetella pertussis NOT  DETECTED NOT DETECTED Final   Bordetella Parapertussis NOT DETECTED NOT DETECTED Final   Chlamydophila pneumoniae NOT DETECTED NOT DETECTED Final   Mycoplasma pneumoniae NOT DETECTED NOT DETECTED Final    Comment: Performed at [facility], [place] [address], [place], [place] [address]         Radiology Studies: CT Angio Chest Pulmonary Embolism (PE) W or WO Contrast  Result Date: 09/16/2022 CLINICAL DATA:  Vomiting, diarrhea, fever, recent C diff, history of PE EXAM: CT ANGIOGRAPHY CHEST CT ABDOMEN AND PELVIS WITH CONTRAST TECHNIQUE: Multidetector CT imaging of the chest was performed using the standard protocol during bolus administration of intravenous contrast. Multiplanar CT image reconstructions and MIPs were obtained to evaluate the vascular anatomy. Multidetector CT imaging of the abdomen and pelvis was performed using the standard protocol during bolus administration of intravenous contrast. RADIATION DOSE REDUCTION: This exam was performed according to the departmental dose-optimization program which includes automated exposure control, adjustment of the mA and/or kV according to patient size and/or use of iterative reconstruction technique. CONTRAST:  171m OMNIPAQUE IOHEXOL 350 MG/ML SOLN COMPARISON:  CTA chest dated 07/29/2022. CT chest abdomen pelvis dated 06/20/2022. FINDINGS: CTA CHEST FINDINGS Cardiovascular: Satisfactory opacification the bilateral pulmonary arteries to the segmental level. No evidence of pulmonary embolism. Specifically, the prior segmental left lower lobe pulmonary embolism has resolved.  Although not tailored for evaluation of the thoracic aorta, there is no evidence thoracic aortic aneurysm or dissection. Atherosclerotic calcifications of the arch. The heart is normal in size.  No pericardial effusion. Mild coronary sclerosis of the LAD and right coronary artery. Mediastinum/Nodes: No suspicious mediastinal lymphadenopathy. Visualized thyroid is unremarkable. Lungs/Pleura: Rounded atelectasis/collapse in the left lower lobe. Associated moderate chronic left pleural effusion with pleural thickening, similar to the prior. Mild patchy right lower lobe opacity, likely atelectasis. New patchy/nodular opacities in the posterior left upper lobe (series 7/image 37), suggesting mild infection/pneumonia. Evaluation lung parenchyma is constrained by respiratory motion. Within that constraint, there are no suspicious pulmonary nodules. Mild centrilobular emphysematous changes, upper lobe predominant. No pneumothorax. Musculoskeletal: Moderate degenerative changes of the thoracic spine. Review of the MIP images confirms the above findings. CT ABDOMEN and PELVIS FINDINGS Motion degraded images. Hepatobiliary: 10 mm cyst versus hemangioma in the central right liver (series 3/image 26), benign. Gallbladder is unremarkable. No intrahepatic or extrahepatic duct dilatation. Pancreas: Within normal limits. Spleen: Within normal limits. Adrenals/Urinary Tract: Adrenal glands are within normal limits. Right renal scarring. Left kidney is within normal limits. No hydronephrosis. Bladder is mildly thick-walled although underdistended. Stomach/Bowel: Stomach is within normal limits. No evidence of bowel obstruction. Appendix is not discretely visualized. Sigmoid diverticulosis, without evidence of diverticulitis. No colonic wall thickening or inflammatory changes in this patient with recent history of C diff. Vascular/Lymphatic: No evidence of abdominal aortic aneurysm. Atherosclerotic calcifications of the abdominal aorta  and branch vessels. No suspicious abdominopelvic lymphadenopathy. Reproductive: Prostatomegaly, suggesting BPH. Other: No abdominopelvic ascites. Tiny fat containing left inguinal hernia. Musculoskeletal: Moderate degenerative changes of the lumbar spine. Review of the MIP images confirms the above findings. IMPRESSION: No evidence of pulmonary embolism. Specifically, the prior segmental left lower lobe pulmonary embolism has resolved. New patchy/nodular opacities in the posterior left upper lobe, suggesting mild infection/pneumonia. Rounded atelectasis/collapse in the left lower lobe. Associated moderate chronic left pleural effusion with pleural thickening. Both findings are chronic. No colonic wall thickening or inflammatory changes in this patient with recent history of C diff. Sigmoid diverticulosis, without evidence of diverticulitis. Additional stable ancillary  findings as above. Aortic Atherosclerosis (ICD10-I70.0) and Emphysema (ICD10-J43.9). Electronically Signed   By: Julian Hy M.D.   On: 09/16/2022 23:01   CT ABDOMEN PELVIS W CONTRAST  Result Date: 09/16/2022 CLINICAL DATA:  Vomiting, diarrhea, fever, recent C diff, history of PE EXAM: CT ANGIOGRAPHY CHEST CT ABDOMEN AND PELVIS WITH CONTRAST TECHNIQUE: Multidetector CT imaging of the chest was performed using the standard protocol during bolus administration of intravenous contrast. Multiplanar CT image reconstructions and MIPs were obtained to evaluate the vascular anatomy. Multidetector CT imaging of the abdomen and pelvis was performed using the standard protocol during bolus administration of intravenous contrast. RADIATION DOSE REDUCTION: This exam was performed according to the departmental dose-optimization program which includes automated exposure control, adjustment of the mA and/or kV according to patient size and/or use of iterative reconstruction technique. CONTRAST:  115m OMNIPAQUE IOHEXOL 350 MG/ML SOLN COMPARISON:  CTA chest  dated 07/29/2022. CT chest abdomen pelvis dated 06/20/2022. FINDINGS: CTA CHEST FINDINGS Cardiovascular: Satisfactory opacification the bilateral pulmonary arteries to the segmental level. No evidence of pulmonary embolism. Specifically, the prior segmental left lower lobe pulmonary embolism has resolved. Although not tailored for evaluation of the thoracic aorta, there is no evidence thoracic aortic aneurysm or dissection. Atherosclerotic calcifications of the arch. The heart is normal in size.  No pericardial effusion. Mild coronary sclerosis of the LAD and right coronary artery. Mediastinum/Nodes: No suspicious mediastinal lymphadenopathy. Visualized thyroid is unremarkable. Lungs/Pleura: Rounded atelectasis/collapse in the left lower lobe. Associated moderate chronic left pleural effusion with pleural thickening, similar to the prior. Mild patchy right lower lobe opacity, likely atelectasis. New patchy/nodular opacities in the posterior left upper lobe (series 7/image 37), suggesting mild infection/pneumonia. Evaluation lung parenchyma is constrained by respiratory motion. Within that constraint, there are no suspicious pulmonary nodules. Mild centrilobular emphysematous changes, upper lobe predominant. No pneumothorax. Musculoskeletal: Moderate degenerative changes of the thoracic spine. Review of the MIP images confirms the above findings. CT ABDOMEN and PELVIS FINDINGS Motion degraded images. Hepatobiliary: 10 mm cyst versus hemangioma in the central right liver (series 3/image 26), benign. Gallbladder is unremarkable. No intrahepatic or extrahepatic duct dilatation. Pancreas: Within normal limits. Spleen: Within normal limits. Adrenals/Urinary Tract: Adrenal glands are within normal limits. Right renal scarring. Left kidney is within normal limits. No hydronephrosis. Bladder is mildly thick-walled although underdistended. Stomach/Bowel: Stomach is within normal limits. No evidence of bowel obstruction.  Appendix is not discretely visualized. Sigmoid diverticulosis, without evidence of diverticulitis. No colonic wall thickening or inflammatory changes in this patient with recent history of C diff. Vascular/Lymphatic: No evidence of abdominal aortic aneurysm. Atherosclerotic calcifications of the abdominal aorta and branch vessels. No suspicious abdominopelvic lymphadenopathy. Reproductive: Prostatomegaly, suggesting BPH. Other: No abdominopelvic ascites. Tiny fat containing left inguinal hernia. Musculoskeletal: Moderate degenerative changes of the lumbar spine. Review of the MIP images confirms the above findings. IMPRESSION: No evidence of pulmonary embolism. Specifically, the prior segmental left lower lobe pulmonary embolism has resolved. New patchy/nodular opacities in the posterior left upper lobe, suggesting mild infection/pneumonia. Rounded atelectasis/collapse in the left lower lobe. Associated moderate chronic left pleural effusion with pleural thickening. Both findings are chronic. No colonic wall thickening or inflammatory changes in this patient with recent history of C diff. Sigmoid diverticulosis, without evidence of diverticulitis. Additional stable ancillary findings as above. Aortic Atherosclerosis (ICD10-I70.0) and Emphysema (ICD10-J43.9). Electronically Signed   By: SJulian HyM.D.   On: 09/16/2022 23:01   DG Abd 2 Views  Result Date: 09/16/2022 CLINICAL DATA:  Nausea and  vomiting EXAM: ABDOMEN - 2 VIEW COMPARISON:  None Available. FINDINGS: There is a large air-fluid level in the stomach. No other dilated bowel loops are seen. Air seen to the level the rectum. There are phleboliths in the pelvis. There is a small left pleural effusion. Degenerative changes affect the spine. IMPRESSION: There is a large air-fluid level in the stomach. No other dilated bowel loops are seen. Electronically Signed   By: Ronney Asters M.D.   On: 09/16/2022 22:10   DG Chest Port 1 View  Result Date:  09/16/2022 CLINICAL DATA:  Vomiting, diarrhea. EXAM: PORTABLE CHEST 1 VIEW COMPARISON:  July 29, 2022. FINDINGS: The heart size and mediastinal contours are within normal limits. Mild right basilar atelectasis or scarring is noted. Stable left pleural effusion is noted with associated left lower lobe pneumonia or atelectasis. The visualized skeletal structures are unremarkable. IMPRESSION: Stable left pleural effusion is noted with associated left lower lobe pneumonia or atelectasis. Mild right basilar atelectasis or scarring is noted. Electronically Signed   By: Marijo Conception M.D.   On: 09/16/2022 21:24        Scheduled Meds:  apixaban  5 mg Oral BID   donepezil  5 mg Oral Daily   escitalopram  20 mg Oral Daily   ezetimibe  10 mg Oral Daily   feeding supplement  237 mL Oral TID BM   influenza vaccine adjuvanted  0.5 mL Intramuscular Tomorrow-1000   insulin aspart  0-5 Units Subcutaneous QHS   insulin aspart  0-9 Units Subcutaneous TID WC   saccharomyces boulardii  250 mg Oral BID   vancomycin  125 mg Oral QID   Continuous Infusions:  sodium chloride Stopped (09/18/22 0028)   ceFEPime (MAXIPIME) IV 2 g (09/18/22 1028)   vancomycin Stopped (09/18/22 0214)     LOS: 2 days       Phillips Climes, MD [facility]   To contact the attending provider between 7A-7P or the covering provider during after hours 7P-7A, please log into the web site [email or web address] and access using universal [place] password for that web site. If you do not have the password, please call the hospital operator.  09/18/2022, 2:48 PM

## 2022-09-18 NOTE — Telephone Encounter (Signed)
Pharmacy Patient Advocate Encounter  Insurance verification completed.    The patient is insured through [employer] Team ADV   The patient is currently admitted and ran test claims for the following: Dificid '200mg'$ .  Copays and coinsurance results were relayed to Inpatient clinical team.

## 2022-09-18 NOTE — TOC Benefit Eligibility Note (Signed)
Patient Teacher, English as a foreign language completed.    The current 30 day co-pay is, $1,431.70 for Dificid '200mg'$ .   The patient is insured through [employer].   Otis Brace, [place] Patient Advocate Specialist [place] Patient Advocate Team Direct Number: [phone]  Fax: [phone]

## 2022-09-19 ENCOUNTER — Other Ambulatory Visit (HOSPITAL_COMMUNITY): Payer: Self-pay

## 2022-09-19 DIAGNOSIS — A0472 Enterocolitis due to Clostridium difficile, not specified as recurrent: Secondary | ICD-10-CM | POA: Diagnosis not present

## 2022-09-19 DIAGNOSIS — J690 Pneumonitis due to inhalation of food and vomit: Secondary | ICD-10-CM | POA: Diagnosis not present

## 2022-09-19 DIAGNOSIS — J9601 Acute respiratory failure with hypoxia: Secondary | ICD-10-CM | POA: Diagnosis not present

## 2022-09-19 LAB — CBC
HCT: 31.6 % — ABNORMAL LOW (ref 39.0–52.0)
Hemoglobin: 10.5 g/dL — ABNORMAL LOW (ref 13.0–17.0)
MCH: 28.5 pg (ref 26.0–34.0)
MCHC: 33.2 g/dL (ref 30.0–36.0)
MCV: 85.6 fL (ref 80.0–100.0)
Platelets: 228 10*3/uL (ref 150–400)
RBC: 3.69 MIL/uL — ABNORMAL LOW (ref 4.22–5.81)
RDW: 14.4 % (ref 11.5–15.5)
WBC: 7.2 10*3/uL (ref 4.0–10.5)
nRBC: 0 % (ref 0.0–0.2)

## 2022-09-19 LAB — BASIC METABOLIC PANEL
Anion gap: 6 (ref 5–15)
BUN: 20 mg/dL (ref 8–23)
CO2: 21 mmol/L — ABNORMAL LOW (ref 22–32)
Calcium: 8.6 mg/dL — ABNORMAL LOW (ref 8.9–10.3)
Chloride: 111 mmol/L (ref 98–111)
Creatinine, Ser: 1.16 mg/dL (ref 0.61–1.24)
GFR, Estimated: 60 mL/min — ABNORMAL LOW (ref >60)
Glucose, Bld: 111 mg/dL — ABNORMAL HIGH (ref 70–99)
Potassium: 4.2 mmol/L (ref 3.5–5.1)
Sodium: 138 mmol/L (ref 135–145)

## 2022-09-19 LAB — GLUCOSE, CAPILLARY
Glucose-Capillary: 99 mg/dL (ref 70–99)
Glucose-Capillary: 104 mg/dL — ABNORMAL HIGH (ref 70–99)
Glucose-Capillary: 135 mg/dL — ABNORMAL HIGH (ref 70–99)
Glucose-Capillary: 120 mg/dL — ABNORMAL HIGH (ref 70–99)

## 2022-09-19 LAB — PHOSPHORUS: Phosphorus: 2.9 mg/dL (ref 2.5–4.6)

## 2022-09-19 MED ORDER — VANCOMYCIN HCL 125 MG PO CAPS: 125.0000 mg | ORAL_CAPSULE | ORAL | Status: DC

## 2022-09-19 MED ORDER — TRAZODONE HCL 50 MG PO TABS: 50.0000 mg | ORAL_TABLET | Freq: Every day | ORAL | Status: DC

## 2022-09-19 MED ORDER — VANCOMYCIN HCL 125 MG PO CAPS: 125.0000 mg | ORAL_CAPSULE | Freq: Two times a day (BID) | ORAL | Status: DC

## 2022-09-19 MED ORDER — VANCOMYCIN HCL 125 MG PO CAPS
125.0000 mg | ORAL_CAPSULE | Freq: Four times a day (QID) | ORAL | Status: DC
  Administered 2022-09-19 – 2022-09-21 (×8): 125 mg via ORAL
  Filled 2022-09-19 (×11): qty 1

## 2022-09-19 MED ORDER — MELATONIN 3 MG PO TABS
3.0000 mg | ORAL_TABLET | Freq: Every evening | ORAL | Status: DC | PRN
  Administered 2022-09-19: 3 mg via ORAL
  Filled 2022-09-19: qty 1

## 2022-09-19 MED ORDER — VANCOMYCIN HCL 125 MG PO CAPS: 125.0000 mg | ORAL_CAPSULE | Freq: Every day | ORAL | Status: DC

## 2022-09-19 MED ORDER — MIRTAZAPINE 15 MG PO TBDP
15.0000 mg | ORAL_TABLET | Freq: Every day | ORAL | Status: DC
  Administered 2022-09-19 – 2022-09-20 (×2): 15 mg via ORAL
  Filled 2022-09-19 (×3): qty 1

## 2022-09-19 NOTE — Progress Notes (Signed)
[facility] for Infectious Disease  Date of Admission:  09/16/2022     Abx: 10/5-c PO vanc   10/4-c vanc 10/4-c cefepime                                                              Assessment: 88 yo male with DNR status, dementia, PAD, hx bladder cancer, HFpEF, CVA (complicated by dysphagia, hx aspiration pna), hx PE (dx'ed 07/29/22) on Eliquis, recurrent cdiff  admitted from home 10/03 due to n/v/diarrhea with brief hypoxia on the day of presentation. The last 24 hours since admission watery diarrhea had escalated and a rectal tube was required   10/3 cdiff antigen and pcr positive; cdiff toxin negative (July positive antigen, toxin, and pcr; no cdiff testing in between yet) 10/3 bcx ngtd 10/3 ucx contaminated 10/5 respiratory viral pcr pending   Clinically he appears to have cdiff colitis and I would treat. I am not sure if this is a true 2nd relapse or first relapse. When his pcp started the po vanc 7/21, he understandably still her intermittent diarrhea but better than initial episode early 06/2022. And given other morbidities, reasonable to get on another long PO vanc taper course rather than fecal matter transplant       With his hypoxia/ct finding of lul opacity not visualized on xray, I think this is simply aspiration pneumonitis. His hypoxia recovered within hours. He has known aspiration and that appears to be a daily thing where he coughs/choke. While the distinction between pneumonitis and pna can be difficult at time, he is rather nontoxic, on room air, no dyspnea, and so we can stop systemic abx and monitor. Cdiff tx should be priority     He has chronic left sided pleural effusion which appear stable and likely due to prior aspiration sequela    -------------- 10/6 assessment Hoarse voice resolved Patient continues to feel rather well. Diarrhea resolved      Plan: If tomorrow continues to do well can discharge from id standpoint Long po vancomycin  tapered coarse planned as following: Finish 2 weeks qid po vanc 125 mg on 10/18 Transition and finish 1 week bid po vanc 125 mg on 10/19 until 10/25 Transition and finish 1 week daily po vanc 125 mg on 10/26 until 11/01 Transition and finish 6 week of qod po vanc 125 mg on 11/02 until 12/14 I do not see the high benefit ratio of adding bezlotoxumab to his treatment Id will sign off Discussed with primary team  I spent more than 35 minute reviewing data/chart, and coordinating care and >50% direct face to face time providing counseling/discussing diagnostics/treatment plan with patient   Principal Problem:   Acute respiratory failure with hypoxia ([place]) Active Problems:   C. difficile colitis   Aspiration pneumonitis ([place])   Allergies  Allergen Reactions   Livalo [Pitavastatin] Other (See Comments)    Cramping and back pain   Haldol [Haloperidol] Other (See Comments)    Family does not like his affect when he is given this - makes him a "zombie."   Other Diarrhea and Other (See Comments)    Unnamed patch and tablets for dementia caused diarrhea (Possibly galantamine, from outside source)     Scheduled Meds:  apixaban  5 mg Oral BID   donepezil  5 mg Oral Daily   escitalopram  20 mg Oral Daily   ezetimibe  10 mg Oral Daily   feeding supplement  237 mL Oral TID BM   influenza vaccine adjuvanted  0.5 mL Intramuscular Tomorrow-1000   insulin aspart  0-5 Units Subcutaneous QHS   insulin aspart  0-9 Units Subcutaneous TID WC   mirtazapine  15 mg Oral QHS   phosphorus  250 mg Oral BID   saccharomyces boulardii  250 mg Oral BID   vancomycin  125 mg Oral QID   Followed by   Derrill Memo ON 10/02/2022] vancomycin  125 mg Oral BID   Followed by   Derrill Memo ON 10/10/2022] vancomycin  125 mg Oral Daily   Followed by   Derrill Memo ON 10/17/2022] vancomycin  125 mg Oral QODAY   Followed by   Derrill Memo ON 10/25/2022] vancomycin  125 mg Oral Q3 days   Continuous Infusions:  sodium chloride 10 mL/hr  at 09/19/22 0300   PRN Meds:.sodium chloride, acetaminophen, melatonin, menthol-cetylpyridinium, ondansetron (ZOFRAN) IV, phenol   SUBJECTIVE: Stool more formed; actually plugged rectal tube this morning requiring flush No n/v/abd pain Afebrile No leukocytosis  Actually tried to leave last night (demented) and found on floor unable to get up  Review of Systems: ROS All other ROS was negative, except mentioned above     OBJECTIVE: Vitals:   09/18/22 2030 09/19/22 0018 09/19/22 0525 09/19/22 1000  BP: (!) 150/47 (!) 153/65 (!) 154/64 (!) 147/63  Pulse: 71 73 65   Resp: '18 18 18 18  '$ Temp: 98.1 F (36.7 C) 99.1 F (37.3 C) 99.1 F (37.3 C) 97.9 F (36.6 C)  TempSrc: Oral Oral Oral Oral  SpO2: 95% 92% 92%   Weight:       Body mass index is 22.2 kg/m.  Physical Exam  General/constitutional: no distress, pleasant HEENT: Normocephalic, PER, Conj Clear, EOMI, Oropharynx clear Neck supple CV: rrr no mrg Lungs: clear to auscultation, normal respiratory effort Abd: Soft, Nontender Ext: no edema Skin: No Rash Neuro: generalized weakness LE strength symmetric 4-5/5 MSK: no peripheral joint swelling/tenderness/warmth; back spines nontender   Lab Results Lab Results  Component Value Date   WBC 7.2 09/19/2022   HGB 10.5 (L) 09/19/2022   HCT 31.6 (L) 09/19/2022   MCV 85.6 09/19/2022   PLT 228 09/19/2022    Lab Results  Component Value Date   CREATININE 1.16 09/19/2022   BUN 20 09/19/2022   NA 138 09/19/2022   K 4.2 09/19/2022   CL 111 09/19/2022   CO2 21 (L) 09/19/2022    Lab Results  Component Value Date   ALT 14 09/17/2022   AST 23 09/17/2022   ALKPHOS 58 09/17/2022   BILITOT 0.6 09/17/2022      Microbiology: Recent Results (from the past 240 hour(s))  Urine Culture     Status: Abnormal   Collection Time: 09/16/22  8:53 PM   Specimen: In/Out Cath Urine  Result Value Ref Range Status   Specimen Description IN/OUT CATH URINE  Final   Special  Requests   Final    NONE Performed at [facility], [address], [place], [place] [address]    Culture MULTIPLE SPECIES PRESENT, SUGGEST RECOLLECTION (A)  Final   Report Status 09/17/2022 FINAL  Final  Blood culture (routine x 2)     Status: None (Preliminary result)   Collection Time: 09/16/22  8:58 PM   Specimen: BLOOD  Result  Value Ref Range Status   Specimen Description BLOOD BLOOD LEFT HAND  Final   Special Requests   Final    BOTTLES DRAWN AEROBIC AND ANAEROBIC Blood Culture adequate volume   Culture   Final    NO GROWTH 3 DAYS Performed at [facility], [address], [place], [place] [address]    Report Status PENDING  Incomplete  Blood culture (routine x 2)     Status: None (Preliminary result)   Collection Time: 09/16/22  9:39 PM   Specimen: BLOOD  Result Value Ref Range Status   Specimen Description BLOOD RIGHT ANTECUBITAL  Final   Special Requests   Final    BOTTLES DRAWN AEROBIC AND ANAEROBIC Blood Culture results may not be optimal due to an excessive volume of blood received in culture bottles   Culture   Final    NO GROWTH 3 DAYS Performed at [facility], [place] [address] [place], [place] [address]    Report Status PENDING  Incomplete  Resp Panel by RT-PCR (Flu A&B, Covid) Urine, Clean Catch     Status: None   Collection Time: 09/16/22  9:48 PM   Specimen: Urine, Clean Catch; Nasal Swab  Result Value Ref Range Status   SARS Coronavirus 2 by RT PCR NEGATIVE NEGATIVE Final    Comment: (NOTE) SARS-CoV-2 target nucleic acids are NOT DETECTED.  The SARS-CoV-2 RNA is generally detectable in upper respiratory specimens during the acute phase of infection. The lowest concentration of SARS-CoV-2 viral copies this assay can detect is 138 copies/mL. A negative result does not preclude SARS-Cov-2 infection and should not be used as the sole basis for treatment or other patient management decisions. A negative result may occur with  improper  specimen collection/handling, submission of specimen other than nasopharyngeal swab, presence of viral mutation(s) within the areas targeted by this assay, and inadequate number of viral copies(<138 copies/mL). A negative result must be combined with clinical observations, patient history, and epidemiological information. The expected result is Negative.  Fact Sheet for Patients:  EntrepreneurPulse.com.au  Fact Sheet for Healthcare Providers:  IncredibleEmployment.be  This test is no t yet approved or cleared by the [place] FDA and  has been authorized for detection and/or diagnosis of SARS-CoV-2 by FDA under an Emergency Use Authorization (EUA). This EUA will remain  in effect (meaning this test can be used) for the duration of the COVID-19 declaration under Section 564(b)(1) of the Act, 21 U.S.C.section 360bbb-3(b)(1), unless the authorization is terminated  or revoked sooner.       Influenza A by PCR NEGATIVE NEGATIVE Final   Influenza B by PCR NEGATIVE NEGATIVE Final    Comment: (NOTE) The Xpert Xpress SARS-CoV-2/FLU/RSV plus assay is intended as an aid in the diagnosis of influenza from Nasopharyngeal swab specimens and should not be used as a sole basis for treatment. Nasal washings and aspirates are unacceptable for Xpert Xpress SARS-CoV-2/FLU/RSV testing.  Fact Sheet for Patients: EntrepreneurPulse.com.au  Fact Sheet for Healthcare Providers: IncredibleEmployment.be  This test is not yet approved or cleared by the [place] FDA and has been authorized for detection and/or diagnosis of SARS-CoV-2 by FDA under an Emergency Use Authorization (EUA). This EUA will remain in effect (meaning this test can be used) for the duration of the COVID-19 declaration under Section 564(b)(1) of the Act, 21 U.S.C. section 360bbb-3(b)(1), unless the authorization is terminated or revoked.  Performed at  [facility], [place] [address] [place], [place] [address]  C Difficile Quick Screen w PCR reflex     Status: Abnormal   Collection Time: 09/16/22 11:55 PM   Specimen: STOOL  Result Value Ref Range Status   C Diff antigen POSITIVE (A) NEGATIVE Final   C Diff toxin NEGATIVE NEGATIVE Final   C Diff interpretation Results are indeterminate. See PCR results.  Final    Comment: Performed at [facility], [place] [address] [place], [place] [address]  C. Diff by PCR, Reflexed     Status: Abnormal   Collection Time: 09/16/22 11:55 PM  Result Value Ref Range Status   Toxigenic C. Difficile by PCR POSITIVE (A) NEGATIVE Final    Comment: Positive for toxigenic C. difficile with little to no toxin production. Only treat if clinical presentation suggests symptomatic illness. Performed at [facility], [place] [address], [place], [place] [address]   Respiratory (~20 pathogens) panel by PCR     Status: None   Collection Time: 09/18/22 11:29 AM   Specimen: Nasopharyngeal Swab; Respiratory  Result Value Ref Range Status   Adenovirus NOT DETECTED NOT DETECTED Final   Coronavirus 229E NOT DETECTED NOT DETECTED Final    Comment: (NOTE) The Coronavirus on the Respiratory Panel, DOES NOT test for the novel  Coronavirus (2019 nCoV)    Coronavirus HKU1 NOT DETECTED NOT DETECTED Final   Coronavirus NL63 NOT DETECTED NOT DETECTED Final   Coronavirus OC43 NOT DETECTED NOT DETECTED Final   Metapneumovirus NOT DETECTED NOT DETECTED Final   Rhinovirus / Enterovirus NOT DETECTED NOT DETECTED Final   Influenza A NOT DETECTED NOT DETECTED Final   Influenza B NOT DETECTED NOT DETECTED Final   Parainfluenza Virus 1 NOT DETECTED NOT DETECTED Final   Parainfluenza Virus 2 NOT DETECTED NOT DETECTED Final   Parainfluenza Virus 3 NOT DETECTED NOT DETECTED Final   Parainfluenza Virus 4 NOT DETECTED NOT DETECTED Final   Respiratory Syncytial Virus NOT DETECTED NOT DETECTED Final   Bordetella pertussis NOT  DETECTED NOT DETECTED Final   Bordetella Parapertussis NOT DETECTED NOT DETECTED Final   Chlamydophila pneumoniae NOT DETECTED NOT DETECTED Final   Mycoplasma pneumoniae NOT DETECTED NOT DETECTED Final    Comment: Performed at [facility], [place]. [address] [place],  [address]     Serology:   Imaging: If present, new imagings (plain films, ct scans, and mri) have been personally visualized and interpreted; radiology reports have been reviewed. Decision making incorporated into the Impression / Recommendations.   Jabier Mutton, [facility] [place] [phone] pager    09/19/2022, 5:07 PM

## 2022-09-19 NOTE — Progress Notes (Signed)
When I walked into the room  at about 1950 I saw Pt sitting on the floor,  Pt's daughter walked in right  behind me. She asked Pt what are you doing, Pt stated "I was trying to get out of here." We checked Pt's vital signs, I called for help to get Pt back in bed. Pt  denied hitting his head on the floor, he also denied pain  in any part  of his body. We assessed Pt there was no visible injury. I paged to notify [facility] on call , Dr. Myna Hidalgo about the fall. Dr Myna Hidalgo called within minutes to asked about Pt's status.

## 2022-09-19 NOTE — Care Management Important Message (Signed)
Important Message  Patient Details  Name: ERRIC MACHNIK MRN: [id number] Date of Birth: 08-20-1930   Medicare Important Message Given:  Yes     Shelda Altes 09/19/2022, 9:57 AM

## 2022-09-19 NOTE — Progress Notes (Signed)
PROGRESS NOTE    Paul [name]  GMW:[id number] DOB: September 27, 1930 DOA: 09/16/2022 PCP: Crist Infante, MD   Chief Complaint  Patient presents with   Emesis / Diarrhea    Sepsis    Brief Narrative:    Paul [name] is a 88 y.o. male with medical history significant for dementia, peripheral artery disease, history of bladder cancer, hypertension, hyperlipidemia, CVA, recent C. difficile infection June 20, 2022, history of pulmonary embolism on Eliquis, who presented to [facility] ED from home due to vomiting and diarrhea.  -His work-up was significant for left lung pneumonia, felt to be secondary to aspiration and profuse diarrhea, C. difficile is unfortunately positive again.   Assessment & Plan:   Principal Problem:   Acute respiratory failure with hypoxia (HCC) Active Problems:   C. difficile colitis   Aspiration pneumonitis (HCC)   Acute hypoxic respiratory failure  Sepsis present on admission - Sepsis patient on admission, febrile 101, elevated lactic acid, tachypneic due to pneumonia, likely aspiration  -No oxygen requirement, initial saturation 78 to 82% on room air, currently on 4 L nasal cannula, improving oxygen requirement, this has resolved, he is on room air today.  . -Seen by SLP, moderate risk for aspiration -Was encouraged to use incentive spirometry and flutter valve. -> Initially on cefepime  then Unasyn, all antibiotics has been discontinued now respiratory status has improved h and given his C. difficile infection .   Intractable nausea and diarrhea C. difficile diarrhea -Multiple episodes, he was treated with Dificid initially, and then when has been on p.o. vancomycin, GI and ID has been consult greatly appreciated .  Dysphagia, worsened with liquids Please see above discussion  Hypophosphatemia -Repleted, monitor closely   Generalized weakness Failure to thrive PT/OT consulted -He reports poor appetite, will start on mirtazapine   History of PE on  Eliquis Resume home Eliquis       DVT prophylaxis: Eliquis Code Status: Full code Family Communication: Daughter at bedside Disposition:   Status is: Inpatient    Consultants:  GI  ID  Subjective:  Voice has improved, had poor night sleep, appetite remains poor, patient reports he is having raspy voice, profuse diarrhea overnight, still with poor appetite, CBG low this morning  Objective: Vitals:   09/18/22 2030 09/19/22 0018 09/19/22 0525 09/19/22 1000  BP: (!) 150/47 (!) 153/65 (!) 154/64 (!) 147/63  Pulse: 71 73 65   Resp: '18 18 18 18  '$ Temp: 98.1 F (36.7 C) 99.1 F (37.3 C) 99.1 F (37.3 C) 97.9 F (36.6 C)  TempSrc: Oral Oral Oral Oral  SpO2: 95% 92% 92%   Weight:        Intake/Output Summary (Last 24 hours) at 09/19/2022 1546 Last data filed at 09/19/2022 1000 Gross per 24 hour  Intake 1183.22 ml  Output 1300 ml  Net -116.78 ml   Filed Weights   09/16/22 2100  Weight: 66.2 kg    Examination:  Awake Alert, Oriented X 3, frail, Symmetrical Chest wall movement, Good air movement bilaterally, CTAB RRR,No Gallops,Rubs or new Murmurs, No Parasternal Heave +ve B.Sounds, Abd Soft, No tenderness, No rebound - guarding or rigidity. No Cyanosis, Clubbing or edema, No new Rash or bruise       Data Reviewed: I have personally reviewed following labs and imaging studies  CBC: Recent Labs  Lab 09/16/22 2055 09/17/22 0350 09/18/22 0210 09/19/22 0148  WBC 14.2* 7.5 7.4 7.2  NEUTROABS 12.7* 6.6  --   --   HGB 13.4  12.4* 10.1* 10.5*  HCT 43.2 39.3 30.8* 31.6*  MCV 90.9 89.9 87.5 85.6  PLT 351 253 189 025    Basic Metabolic Panel: Recent Labs  Lab 09/16/22 2055 09/17/22 0350 09/18/22 0210 09/19/22 0148  NA 140 136 135 138  K 3.9 4.1 3.7 4.2  CL 107 107 106 111  CO2 20* 22 22 21*  GLUCOSE 185* 135* 85 111*  BUN '21 19 21 20  '$ CREATININE 1.20 1.27* 1.23 1.16  CALCIUM 9.2 8.5* 8.0* 8.6*  MG  --  1.7  --   --   PHOS  --  1.7* 2.5 2.9     GFR: Estimated Creatinine Clearance: 39.6 mL/min (by C-G formula based on SCr of 1.16 mg/dL).  Liver Function Tests: Recent Labs  Lab 09/16/22 2055 09/17/22 0350  AST 22 23  ALT 14 14  ALKPHOS 75 58  BILITOT 0.7 0.6  PROT 6.7 5.7*  ALBUMIN 3.9 3.2*    CBG: Recent Labs  Lab 09/18/22 1319 09/18/22 1705 09/18/22 2128 09/19/22 0757 09/19/22 1205  GLUCAP 135* 122* 139* 99 104*     Recent Results (from the past 240 hour(s))  Urine Culture     Status: Abnormal   Collection Time: 09/16/22  8:53 PM   Specimen: In/Out Cath Urine  Result Value Ref Range Status   Specimen Description IN/OUT CATH URINE  Final   Special Requests   Final    NONE Performed at [facility], [place] [address] [place], [place] [address]    Culture MULTIPLE SPECIES PRESENT, SUGGEST RECOLLECTION (A)  Final   Report Status 09/17/2022 FINAL  Final  Blood culture (routine x 2)     Status: None (Preliminary result)   Collection Time: 09/16/22  8:58 PM   Specimen: BLOOD  Result Value Ref Range Status   Specimen Description BLOOD BLOOD LEFT HAND  Final   Special Requests   Final    BOTTLES DRAWN AEROBIC AND ANAEROBIC Blood Culture adequate volume   Culture   Final    NO GROWTH 3 DAYS Performed at [facility], [place] [address], [place], [place] [address]    Report Status PENDING  Incomplete  Blood culture (routine x 2)     Status: None (Preliminary result)   Collection Time: 09/16/22  9:39 PM   Specimen: BLOOD  Result Value Ref Range Status   Specimen Description BLOOD RIGHT ANTECUBITAL  Final   Special Requests   Final    BOTTLES DRAWN AEROBIC AND ANAEROBIC Blood Culture results may not be optimal due to an excessive volume of blood received in culture bottles   Culture   Final    NO GROWTH 3 DAYS Performed at [facility], [place] [address], [place], [place] [address]    Report Status PENDING  Incomplete  Resp Panel by RT-PCR (Flu A&B, Covid) Urine, Clean Catch     Status: None    Collection Time: 09/16/22  9:48 PM   Specimen: Urine, Clean Catch; Nasal Swab  Result Value Ref Range Status   SARS Coronavirus 2 by RT PCR NEGATIVE NEGATIVE Final    Comment: (NOTE) SARS-CoV-2 target nucleic acids are NOT DETECTED.  The SARS-CoV-2 RNA is generally detectable in upper respiratory specimens during the acute phase of infection. The lowest concentration of SARS-CoV-2 viral copies this assay can detect is 138 copies/mL. A negative result does not preclude SARS-Cov-2 infection and should not be used as the sole basis for treatment or other patient management  decisions. A negative result may occur with  improper specimen collection/handling, submission of specimen other than nasopharyngeal swab, presence of viral mutation(s) within the areas targeted by this assay, and inadequate number of viral copies(<138 copies/mL). A negative result must be combined with clinical observations, patient history, and epidemiological information. The expected result is Negative.  Fact Sheet for Patients:  EntrepreneurPulse.com.au  Fact Sheet for Healthcare Providers:  IncredibleEmployment.be  This test is no t yet approved or cleared by the [place] FDA and  has been authorized for detection and/or diagnosis of SARS-CoV-2 by FDA under an Emergency Use Authorization (EUA). This EUA will remain  in effect (meaning this test can be used) for the duration of the COVID-19 declaration under Section 564(b)(1) of the Act, 21 U.S.C.section 360bbb-3(b)(1), unless the authorization is terminated  or revoked sooner.       Influenza A by PCR NEGATIVE NEGATIVE Final   Influenza B by PCR NEGATIVE NEGATIVE Final    Comment: (NOTE) The Xpert Xpress SARS-CoV-2/FLU/RSV plus assay is intended as an aid in the diagnosis of influenza from Nasopharyngeal swab specimens and should not be used as a sole basis for treatment. Nasal washings and aspirates are  unacceptable for Xpert Xpress SARS-CoV-2/FLU/RSV testing.  Fact Sheet for Patients: EntrepreneurPulse.com.au  Fact Sheet for Healthcare Providers: IncredibleEmployment.be  This test is not yet approved or cleared by the [place] FDA and has been authorized for detection and/or diagnosis of SARS-CoV-2 by FDA under an Emergency Use Authorization (EUA). This EUA will remain in effect (meaning this test can be used) for the duration of the COVID-19 declaration under Section 564(b)(1) of the Act, 21 U.S.C. section 360bbb-3(b)(1), unless the authorization is terminated or revoked.  Performed at [facility], [place] [address], [place], [place] [address]   C Difficile Quick Screen w PCR reflex     Status: Abnormal   Collection Time: 09/16/22 11:55 PM   Specimen: STOOL  Result Value Ref Range Status   C Diff antigen POSITIVE (A) NEGATIVE Final   C Diff toxin NEGATIVE NEGATIVE Final   C Diff interpretation Results are indeterminate. See PCR results.  Final    Comment: Performed at [facility], [place] [address] [place], [place] [address]  C. Diff by PCR, Reflexed     Status: Abnormal   Collection Time: 09/16/22 11:55 PM  Result Value Ref Range Status   Toxigenic C. Difficile by PCR POSITIVE (A) NEGATIVE Final    Comment: Positive for toxigenic C. difficile with little to no toxin production. Only treat if clinical presentation suggests symptomatic illness. Performed at [facility], [place] [address], [place], [place] [address]   Respiratory (~20 pathogens) panel by PCR     Status: None   Collection Time: 09/18/22 11:29 AM   Specimen: Nasopharyngeal Swab; Respiratory  Result Value Ref Range Status   Adenovirus NOT DETECTED NOT DETECTED Final   Coronavirus 229E NOT DETECTED NOT DETECTED Final    Comment: (NOTE) The Coronavirus on the Respiratory Panel, DOES NOT test for the novel  Coronavirus (2019 nCoV)    Coronavirus HKU1 NOT  DETECTED NOT DETECTED Final   Coronavirus NL63 NOT DETECTED NOT DETECTED Final   Coronavirus OC43 NOT DETECTED NOT DETECTED Final   Metapneumovirus NOT DETECTED NOT DETECTED Final   Rhinovirus / Enterovirus NOT DETECTED NOT DETECTED Final   Influenza A NOT DETECTED NOT DETECTED Final   Influenza B NOT DETECTED NOT DETECTED Final   Parainfluenza Virus 1 NOT  DETECTED NOT DETECTED Final   Parainfluenza Virus 2 NOT DETECTED NOT DETECTED Final   Parainfluenza Virus 3 NOT DETECTED NOT DETECTED Final   Parainfluenza Virus 4 NOT DETECTED NOT DETECTED Final   Respiratory Syncytial Virus NOT DETECTED NOT DETECTED Final   Bordetella pertussis NOT DETECTED NOT DETECTED Final   Bordetella Parapertussis NOT DETECTED NOT DETECTED Final   Chlamydophila pneumoniae NOT DETECTED NOT DETECTED Final   Mycoplasma pneumoniae NOT DETECTED NOT DETECTED Final    Comment: Performed at [facility], [place] [address], [place], [place] [address]         Radiology Studies: DG Abd 1 View  Result Date: 09/18/2022 CLINICAL DATA:  Abdominal distension EXAM: ABDOMEN - 1 VIEW COMPARISON:  09/16/2022 FINDINGS: Scattered large and small bowel gas is noted. No obstructive changes are seen. No free air is noted. Degenerative changes of lumbar spine are seen. No abnormal mass is seen. IMPRESSION: No acute abnormality noted. Electronically Signed   By: Inez Catalina M.D.   On: 09/18/2022 19:11        Scheduled Meds:  apixaban  5 mg Oral BID   donepezil  5 mg Oral Daily   escitalopram  20 mg Oral Daily   ezetimibe  10 mg Oral Daily   feeding supplement  237 mL Oral TID BM   influenza vaccine adjuvanted  0.5 mL Intramuscular Tomorrow-1000   insulin aspart  0-5 Units Subcutaneous QHS   insulin aspart  0-9 Units Subcutaneous TID WC   phosphorus  250 mg Oral BID   saccharomyces boulardii  250 mg Oral BID   traZODone  50 mg Oral QHS   vancomycin  125 mg Oral QID   Followed by   Derrill Memo ON 10/02/2022] vancomycin  125  mg Oral BID   Followed by   Derrill Memo ON 10/10/2022] vancomycin  125 mg Oral Daily   Followed by   Derrill Memo ON 10/17/2022] vancomycin  125 mg Oral QODAY   Followed by   Derrill Memo ON 10/25/2022] vancomycin  125 mg Oral Q3 days   Continuous Infusions:  sodium chloride 10 mL/hr at 09/19/22 0300     LOS: 3 days       Phillips Climes, MD [facility]   To contact the attending provider between 7A-7P or the covering provider during after hours 7P-7A, please log into the web site [email or web address] and access using universal [place] password for that web site. If you do not have the password, please call the hospital operator.  09/19/2022, 3:46 PM

## 2022-09-19 NOTE — Telephone Encounter (Signed)
Received notification from [facility] regarding a prior authorization for Zinplava. Authorization has been APPROVED.  Per test claim, copay for 1 day supply is $137.16.    Authorization #  PA Case ID: [id number] Key: [id number]

## 2022-09-19 NOTE — TOC Initial Note (Signed)
Transition of Care [facility]) - Initial/Assessment Note    Patient Details  Name: Paul [name] MRN: [id number] Date of Birth: [birth year]  [facility]) CM/SW Contact:    Bethena Roys, RN Phone Number: 09/19/2022, 12:14 PM  Clinical Narrative: Risk for readmission assessment completed. PTA patient was home with spouse; daughter and son check in often. Patient is active with [facility] for PT/OT- will need resumption orders and F2F once stable.  Per daughter, patient has rolling walker, wheelchair, and bedside commode in the home. Daughter states the rolling walker is old and they will decide if a new one needs to be ordered. [facility] is following the patient for disposition needs. Weekend Case Manager to follow for additional needs as the patient progresses.         Expected Discharge Plan: [place] Barriers to Discharge: Continued Medical Work up   Patient Goals and CMS Choice     Choice offered to / list presented to :  (patient is currently active with [facility])  Expected Discharge Plan and Services Expected Discharge Plan: [place]   Discharge Planning Services: CM Consult Post Acute Care Choice: Home Health, Resumption of Svcs/PTA Provider Living arrangements for the past 2 months: Single Family Home                   DME Agency: NA       HH Arranged: PT, OT HH Agency: [place] Date [place]: 09/18/22 Time HH Agency Contacted: 1242 Representative spoke with at [place]: Claiborne Billings  Prior Living Arrangements/Services Living arrangements for the past 2 months: [place]   Patient language and need for interpreter reviewed:: Yes Do you feel safe going back to the place where you live?: Yes      Need for Family Participation in Patient Care: Yes (Comment) Care giver support system in place?: Yes (comment)   Criminal Activity/Legal Involvement Pertinent  to Current Situation/Hospitalization: No - Comment as needed  Activities of Daily Living   ADL Screening (condition at time of admission) Patient's cognitive ability adequate to safely complete daily activities?: Yes Is the patient deaf or have difficulty hearing?: No Does the patient have difficulty seeing, even when wearing glasses/contacts?: No Does the patient have difficulty concentrating, remembering, or making decisions?: No Patient able to express need for assistance with ADLs?: Yes Does the patient have difficulty dressing or bathing?: Yes Independently performs ADLs?: No Communication: Independent Dressing (OT): Needs assistance Is this a change from baseline?: Pre-admission baseline Grooming: Needs assistance Is this a change from baseline?: Pre-admission baseline Feeding: Independent Bathing: Needs assistance Is this a change from baseline?: Pre-admission baseline Toileting: Needs assistance Is this a change from baseline?: Pre-admission baseline In/Out Bed: Needs assistance Is this a change from baseline?: Pre-admission baseline Does the patient have difficulty walking or climbing stairs?: Yes Weakness of Legs: Both Weakness of Arms/Hands: None  Permission Sought/Granted Permission sought to share information with : Family Supports, Customer service manager, Case Optician, dispensing granted to share information with : Yes, Verbal Permission Granted     Permission granted to share info w AGENCY: Claiborne Billings        Emotional Assessment         Alcohol / Substance Use: Not Applicable Psych Involvement: No (comment)  Admission diagnosis:  Acute respiratory failure with hypoxia ([place]) [J96.01] Pneumonia of left lower lobe due to infectious organism [J18.9] Sepsis, due to unspecified  organism, unspecified whether acute organ dysfunction present [facility]) [A41.9] Patient Active Problem List   Diagnosis Date Noted   Aspiration pneumonitis ([place])    Pulmonary embolism  ([place]) 07/30/2022   Acute pulmonary embolism ([place]) 07/29/2022   Bacteremia due to Gram-negative bacteria 06/21/2022   C. difficile colitis 06/20/2022   Renal insufficiency 06/20/2022   History of aspiration pneumonia 06/20/2022   Sepsis ([place]) 06/20/2022   Chronic diastolic CHF (congestive heart failure) ([place]) 06/20/2022   Delirium 06/13/2022   Malnutrition of moderate degree 06/12/2022   Pneumonia 06/10/2022   Loculated pleural effusion 06/10/2022   Fever 06/10/2022   Leukocytosis 06/10/2022   Dysphagia 06/10/2022   Acute respiratory failure with hypoxia ([place]) 06/10/2022   Acute CVA (cerebrovascular accident) ([place]) 01/30/2022   Pressure injury of skin 09/05/2021   HLD (hyperlipidemia) 09/05/2021   Dementia with behavioral disturbance ([place]) 09/05/2021   COVID-19 virus infection 09/04/2021   Exertional dyspnea 08/28/2015   Depressive disorder 08/28/2015   PAD (peripheral artery disease) ([place]) 05/26/2012   Dyspnea 04/09/2012   Hypertension 04/09/2012   Claudication ([place]) 04/09/2012   Transitional cell carcinoma of bladder ([place]) 01/25/2012   PCP:  Crist Infante, MD Pharmacy:   [place] ([facility]), [place] [address] [place] [address] Phone: [phone] Fax: [phone]  [facility] [address] [place] [address] Phone: [phone] Fax: [phone]   Readmission Risk Interventions    09/18/2022   12:41 PM  Readmission Risk Prevention Plan  Transportation Screening Complete  Medication Review (RN Care Manager) Complete  HRI or [place] Complete  SW Recovery Care/Counseling Consult Complete  Palliative Care Screening Not [place] Not Applicable

## 2022-09-19 NOTE — Progress Notes (Signed)
Progress Note  Primary GI: Dr. Henrene Pastor   Subjective  Chief Complaint: Vomiting, diarrhea and weakness  Patient lying in bed sleeping, daughter asked that I not wake him. Daughter states he did not sleep all night, did have a fall once while she was gone without injury. She states yesterday evening he did not have dinner, mainly just been drinking Ensure.  Having headache which he is taking Tylenol for. Patient has rectal Flexi-Seal in place and did have a solid poop yesterday which nurses had to clean out. Per daughter patient had another fever last night, she is asking if there is some sort of infection that were missing or some that could be going on.  He was removed from antibiotics for possible aspiration pneumonia yesterday.    Objective   Vital signs in last 24 hours: Temp:  [98.1 F (36.7 C)-99.1 F (37.3 C)] 99.1 F (37.3 C) (10/06 0525) Pulse Rate:  [61-75] 65 (10/06 0525) Resp:  [18-19] 18 (10/06 0525) BP: (115-154)/(47-65) 154/64 (10/06 0525) SpO2:  [92 %-100 %] 92 % (10/06 0525) Last BM Date : 09/18/22 Last BM recorded by nurses in past 5 days Stool Type: Type 7 (Liquid consistency with no solid pieces) (09/18/2022  8:30 PM)  General:   male lying in bed sleeping no acute distress Did not do physical exam, can return and do this.  Intake/Output from previous day: 10/05 0701 - 10/06 0700 In: 946.2 [P.O.:720; IV Piggyback:106.2] Out: 1300 [Urine:1300] Intake/Output this shift: No intake/output data recorded.  Studies/Results: DG Abd 1 View  Result Date: 09/18/2022 CLINICAL DATA:  Abdominal distension EXAM: ABDOMEN - 1 VIEW COMPARISON:  09/16/2022 FINDINGS: Scattered large and small bowel gas is noted. No obstructive changes are seen. No free air is noted. Degenerative changes of lumbar spine are seen. No abnormal mass is seen. IMPRESSION: No acute abnormality noted. Electronically Signed   By: Inez Catalina M.D.   On: 09/18/2022 19:11    Lab Results: Recent Labs     09/17/22 0350 09/18/22 0210 09/19/22 0148  WBC 7.5 7.4 7.2  HGB 12.4* 10.1* 10.5*  HCT 39.3 30.8* 31.6*  PLT 253 189 228   BMET Recent Labs    09/17/22 0350 09/18/22 0210 09/19/22 0148  NA 136 135 138  K 4.1 3.7 4.2  CL 107 106 111  CO2 22 22 21*  GLUCOSE 135* 85 111*  BUN '19 21 20  '$ CREATININE 1.27* 1.23 1.16  CALCIUM 8.5* 8.0* 8.6*   LFT Recent Labs    09/17/22 0350  PROT 5.7*  ALBUMIN 3.2*  AST 23  ALT 14  ALKPHOS 58  BILITOT 0.6   PT/INR Recent Labs    09/16/22 2145  LABPROT 16.4*  INR 1.3*     Scheduled Meds:  apixaban  5 mg Oral BID   donepezil  5 mg Oral Daily   escitalopram  20 mg Oral Daily   ezetimibe  10 mg Oral Daily   feeding supplement  237 mL Oral TID BM   influenza vaccine adjuvanted  0.5 mL Intramuscular Tomorrow-1000   insulin aspart  0-5 Units Subcutaneous QHS   insulin aspart  0-9 Units Subcutaneous TID WC   phosphorus  250 mg Oral BID   saccharomyces boulardii  250 mg Oral BID   traZODone  50 mg Oral QHS   vancomycin  125 mg Oral QID   Continuous Infusions:  sodium chloride 10 mL/hr at 09/19/22 0300      Patient profile:   88 year old  male with history of CVA, prior bladder cancer, PAD, PE on Eliquis, recent C. difficile infection in 06/2022 had been on dificin/vancomycin  presents with vomiting, diarrhea, and weakness.  Febrile on arrival.   Impression/Plan:   Recurrent versus refractory C. Difficile 09/16/2022 CT abdomen pelvis with contrast showed unremarkable gallbladder, pancreas, liver, spleen normal stomach, no evidence of bowel obstruction.  Diverticulosis without diverticulitis.  No colonic wall thickening or inflammatory changes C. difficile antigen positive, toxin negative, PCR positive ID consulted and suggest proceeding with p.o. vancomycin long taper KUB without ileus/obstruction Patient did have some formed stool yesterday Monitor closely,  consider FMT with high risk of recurrence  Aspiration  pneumonitis versus pneumonia 09/2022 CTA patchy/nodular opacities posterior left upper lobe, left lower lobe atelectasis, chronic left pleural effusion, no colonic wall thickening or inflammatory changes ID is following, stop systemic antibiotics 10/5 Low-grade temperature this evening Monitor closely, could potentially repeat chest x-ray versus CT if fever returns or leukocyotosis  Dementia s/p fall yesterday Likely sundowning No injury  Anemia chronic normocytic HGB 10.5 (12.4) (13.4)  MCV 85.6  Anemia studies on 01/31/2022  Iron 36 Ferritin 138 B12 402-normal ferritin, iron deficiency February No overt GI bleeding, monitor  Principal Problem:   Acute respiratory failure with hypoxia (HCC) Active Problems:   C. difficile colitis   Aspiration pneumonitis ([place])    LOS: 3 days   Paul [name]  09/19/2022, 8:29 AM

## 2022-09-20 DIAGNOSIS — J9601 Acute respiratory failure with hypoxia: Secondary | ICD-10-CM | POA: Diagnosis not present

## 2022-09-20 DIAGNOSIS — A0472 Enterocolitis due to Clostridium difficile, not specified as recurrent: Secondary | ICD-10-CM | POA: Diagnosis not present

## 2022-09-20 LAB — BASIC METABOLIC PANEL
Anion gap: 8 (ref 5–15)
BUN: 11 mg/dL (ref 8–23)
CO2: 27 mmol/L (ref 22–32)
Calcium: 9.2 mg/dL (ref 8.9–10.3)
Chloride: 107 mmol/L (ref 98–111)
Creatinine, Ser: 0.99 mg/dL (ref 0.61–1.24)
GFR, Estimated: >60 mL/min (ref >60)
Glucose, Bld: 94 mg/dL (ref 70–99)
Potassium: 4.4 mmol/L (ref 3.5–5.1)
Sodium: 142 mmol/L (ref 135–145)

## 2022-09-20 LAB — CBC
HCT: 36.9 % — ABNORMAL LOW (ref 39.0–52.0)
Hemoglobin: 11.8 g/dL — ABNORMAL LOW (ref 13.0–17.0)
MCH: 27.9 pg (ref 26.0–34.0)
MCHC: 32 g/dL (ref 30.0–36.0)
MCV: 87.2 fL (ref 80.0–100.0)
Platelets: 260 10*3/uL (ref 150–400)
RBC: 4.23 MIL/uL (ref 4.22–5.81)
RDW: 14.4 % (ref 11.5–15.5)
WBC: 6.6 10*3/uL (ref 4.0–10.5)
nRBC: 0 % (ref 0.0–0.2)

## 2022-09-20 LAB — GLUCOSE, CAPILLARY
Glucose-Capillary: 80 mg/dL (ref 70–99)
Glucose-Capillary: 116 mg/dL — ABNORMAL HIGH (ref 70–99)
Glucose-Capillary: 186 mg/dL — ABNORMAL HIGH (ref 70–99)
Glucose-Capillary: 81 mg/dL (ref 70–99)

## 2022-09-20 MED ORDER — HYDRALAZINE HCL 20 MG/ML IJ SOLN
10.0000 mg | INTRAMUSCULAR | Status: DC | PRN
  Administered 2022-09-20: 10 mg via INTRAVENOUS
  Filled 2022-09-20: qty 1

## 2022-09-20 NOTE — Progress Notes (Signed)
PROGRESS NOTE FOR [facility]  Subjective: No acute events, but his family member reports a significant amount of diarrhea in the rectal bag.  Objective: Vital signs in last 24 hours: Temp:  [98.2 F (36.8 C)-98.4 F (36.9 C)] 98.4 F (36.9 C) (10/07 0533) Pulse Rate:  [63] 63 (10/06 2044) Resp:  [16-18] 18 (10/07 0533) BP: (171-185)/(74-76) 185/74 (10/07 0533) SpO2:  [95 %-96 %] 95 % (10/07 0533) Last BM Date : 09/19/22  Intake/Output from previous day: 10/06 0701 - 10/07 0700 In: 717 [P.O.:717] Out: 1600 [Urine:1600] Intake/Output this shift: Total I/O In: -  Out: 650 [Urine:650]  General appearance: alert and no distress GI: soft, non-tender; bowel sounds normal; no masses,  no organomegaly  Lab Results: Recent Labs    09/18/22 0210 09/19/22 0148 09/20/22 0142  WBC 7.4 7.2 6.6  HGB 10.1* 10.5* 11.8*  HCT 30.8* 31.6* 36.9*  PLT 189 228 260   BMET Recent Labs    09/18/22 0210 09/19/22 0148 09/20/22 0142  NA 135 138 142  K 3.7 4.2 4.4  CL 106 111 107  CO2 22 21* 27  GLUCOSE 85 111* 94  BUN '21 20 11  '$ CREATININE 1.23 1.16 0.99  CALCIUM 8.0* 8.6* 9.2   LFT No results for input(s): "PROT", "ALBUMIN", "AST", "ALT", "ALKPHOS", "BILITOT", "BILIDIR", "IBILI" in the last 72 hours. PT/INR No results for input(s): "LABPROT", "INR" in the last 72 hours. Hepatitis Panel No results for input(s): "HEPBSAG", "HCVAB", "HEPAIGM", "HEPBIGM" in the last 72 hours. C-Diff No results for input(s): "CDIFFTOX" in the last 72 hours. Fecal Lactopherrin No results for input(s): "FECLLACTOFRN" in the last 72 hours.  Studies/Results: DG Abd 1 View  Result Date: 09/18/2022 CLINICAL DATA:  Abdominal distension EXAM: ABDOMEN - 1 VIEW COMPARISON:  09/16/2022 FINDINGS: Scattered large and small bowel gas is noted. No obstructive changes are seen. No free air is noted. Degenerative changes of lumbar spine are seen. No abnormal mass is seen. IMPRESSION: No acute abnormality noted.  Electronically Signed   By: Inez Catalina M.D.   On: 09/18/2022 19:11    Medications: Scheduled:  apixaban  5 mg Oral BID   donepezil  5 mg Oral Daily   escitalopram  20 mg Oral Daily   ezetimibe  10 mg Oral Daily   feeding supplement  237 mL Oral TID BM   influenza vaccine adjuvanted  0.5 mL Intramuscular Tomorrow-1000   insulin aspart  0-5 Units Subcutaneous QHS   insulin aspart  0-9 Units Subcutaneous TID WC   mirtazapine  15 mg Oral QHS   saccharomyces boulardii  250 mg Oral BID   vancomycin  125 mg Oral QID   Followed by   Derrill Memo ON 10/02/2022] vancomycin  125 mg Oral BID   Followed by   Derrill Memo ON 10/10/2022] vancomycin  125 mg Oral Daily   Followed by   Derrill Memo ON 10/17/2022] vancomycin  125 mg Oral QODAY   Followed by   Derrill Memo ON 10/25/2022] vancomycin  125 mg Oral Q3 days   Continuous:  sodium chloride 10 mL/hr at 09/19/22 0300    Assessment/Plan: 1) C. Diff colitis. 2) Watery diarrhea. 3) Pneumonia.   The patient is clinically stable, but it does appear that he is having significant watery diarrhea.  The rectal bag was quite full, but there was a lack of documentation in the I/O's.  He is not hypotensive, but his fluid status needs to monitored.  With persistent watery diarrhea dehydration is a concern.  Plan: 1)  Continue with vancomycin per ID. 2) Monitor fluid status and apply IV hydration if necessary.    LOS: 4 days   Velvie Thomaston D 09/20/2022, 12:05 PM

## 2022-09-20 NOTE — Progress Notes (Signed)
TR[facility] night cross cover note:   I was notified by RN that the patient's systolic blood pressures are running in the 180s, slightly higher than preceding values in the 150s will without new patient complaint or report of acute focal neurologic deficit. HR in 60's. I added as needed IV hydralazine for systolic blood pressure greater than 290 or diastolic blood pressure greater than 120.     Babs Bertin, DO Hospitalist

## 2022-09-20 NOTE — Progress Notes (Signed)
PROGRESS NOTE    Paul [name]  OXB:[id number] DOB: July 10, 1930 DOA: 09/16/2022 PCP: Crist Infante, MD   Chief Complaint  Patient presents with   Emesis / Diarrhea    Sepsis    Brief Narrative:    Paul [name] is a 88 y.o. male with medical history significant for dementia, peripheral artery disease, history of bladder cancer, hypertension, hyperlipidemia, CVA, recent C. difficile infection June 20, 2022, history of pulmonary embolism on Eliquis, who presented to [facility] ED from home due to vomiting and diarrhea.  -His work-up was significant for left lung pneumonia, felt to be secondary to aspiration and profuse diarrhea, C. difficile is unfortunately positive again.   Assessment & Plan:   Principal Problem:   Acute respiratory failure with hypoxia (HCC) Active Problems:   C. difficile colitis   Aspiration pneumonitis (HCC)   Acute hypoxic respiratory failure  Sepsis present on admission - Sepsis patient on admission, febrile 101, elevated lactic acid, tachypneic due to pneumonia, likely aspiration  -No oxygen requirement, initial saturation 78 to 82% on room air, currently on 4 L nasal cannula, improving oxygen requirement, this has resolved, he is on room air today.  . -Seen by SLP, moderate risk for aspiration -Was encouraged to use incentive spirometry and flutter valve. -> Initially on cefepime  then Unasyn, all antibiotics has been discontinued now respiratory status has improved h and given his C. difficile infection .   Intractable nausea and diarrhea C. difficile diarrhea -Multiple episodes, he was treated with Dificid initially, and then when has been on p.o. vancomycin, GI and ID has been consult greatly appreciated .  Will need prolonged p.o. vancomycin taper  Dysphagia, worsened with liquids Please see above discussion  Hypophosphatemia -Repleted, monitor closely   Generalized weakness Failure to thrive PT/OT consulted -He reports poor appetite, will start on  mirtazapine   History of PE on Eliquis Resume home Eliquis       DVT prophylaxis: Eliquis Code Status: Full code Family Communication: Daughter at bedside daily Disposition:   Status is: Inpatient    Consultants:  GI  ID  Subjective:  No significant events overnight, as discussed with staff Flexi-Seal bag does not have any output since a.m., so it will be discontinued  Objective: Vitals:   09/19/22 1000 09/19/22 2044 09/20/22 0533 09/20/22 1440  BP: (!) 147/63 (!) 171/76 (!) 185/74 (!) 155/68  Pulse:  63  65  Resp: '18 16 18 16  '$ Temp: 97.9 F (36.6 C) 98.2 F (36.8 C) 98.4 F (36.9 C) 98.7 F (37.1 C)  TempSrc: Oral Oral Oral Oral  SpO2:  96% 95% 93%  Weight:        Intake/Output Summary (Last 24 hours) at 09/20/2022 1520 Last data filed at 09/20/2022 1151 Gross per 24 hour  Intake 480 ml  Output 2250 ml  Net -1770 ml   Filed Weights   09/16/22 2100  Weight: 66.2 kg    Examination:  Awake Alert, No new F.N deficits, Normal affect Symmetrical Chest wall movement, Good air movement bilaterally, CTAB RRR,No Gallops,Rubs or new Murmurs, No Parasternal Heave +ve B.Sounds, Abd Soft, No tenderness, No rebound - guarding or rigidity. No Cyanosis, Clubbing or edema, No new Rash or bruise       Data Reviewed: I have personally reviewed following labs and imaging studies  CBC: Recent Labs  Lab 09/16/22 2055 09/17/22 0350 09/18/22 0210 09/19/22 0148 09/20/22 0142  WBC 14.2* 7.5 7.4 7.2 6.6  NEUTROABS 12.7* 6.6  --   --   --  HGB 13.4 12.4* 10.1* 10.5* 11.8*  HCT 43.2 39.3 30.8* 31.6* 36.9*  MCV 90.9 89.9 87.5 85.6 87.2  PLT 351 253 189 228 017    Basic Metabolic Panel: Recent Labs  Lab 09/16/22 2055 09/17/22 0350 09/18/22 0210 09/19/22 0148 09/20/22 0142  NA 140 136 135 138 142  K 3.9 4.1 3.7 4.2 4.4  CL 107 107 106 111 107  CO2 20* 22 22 21* 27  GLUCOSE 185* 135* 85 111* 94  BUN '21 19 21 20 11  '$ CREATININE 1.20 1.27* 1.23 1.16 0.99   CALCIUM 9.2 8.5* 8.0* 8.6* 9.2  MG  --  1.7  --   --   --   PHOS  --  1.7* 2.5 2.9  --     GFR: Estimated Creatinine Clearance: 46.4 mL/min (by C-G formula based on SCr of 0.99 mg/dL).  Liver Function Tests: Recent Labs  Lab 09/16/22 2055 09/17/22 0350  AST 22 23  ALT 14 14  ALKPHOS 75 58  BILITOT 0.7 0.6  PROT 6.7 5.7*  ALBUMIN 3.9 3.2*    CBG: Recent Labs  Lab 09/19/22 1205 09/19/22 1637 09/19/22 2051 09/20/22 0758 09/20/22 1141  GLUCAP 104* 135* 120* 80 116*     Recent Results (from the past 240 hour(s))  Urine Culture     Status: Abnormal   Collection Time: 09/16/22  8:53 PM   Specimen: In/Out Cath Urine  Result Value Ref Range Status   Specimen Description IN/OUT CATH URINE  Final   Special Requests   Final    NONE Performed at [facility], [place] [address], [place], [place] [address]    Culture MULTIPLE SPECIES PRESENT, SUGGEST RECOLLECTION (A)  Final   Report Status 09/17/2022 FINAL  Final  Blood culture (routine x 2)     Status: None (Preliminary result)   Collection Time: 09/16/22  8:58 PM   Specimen: BLOOD  Result Value Ref Range Status   Specimen Description BLOOD BLOOD LEFT HAND  Final   Special Requests   Final    BOTTLES DRAWN AEROBIC AND ANAEROBIC Blood Culture adequate volume   Culture   Final    NO GROWTH 4 DAYS Performed at [facility], [place] [address] [place], [place] [address]    Report Status PENDING  Incomplete  Blood culture (routine x 2)     Status: None (Preliminary result)   Collection Time: 09/16/22  9:39 PM   Specimen: BLOOD  Result Value Ref Range Status   Specimen Description BLOOD RIGHT ANTECUBITAL  Final   Special Requests   Final    BOTTLES DRAWN AEROBIC AND ANAEROBIC Blood Culture results may not be optimal due to an excessive volume of blood received in culture bottles   Culture   Final    NO GROWTH 4 DAYS Performed at [facility], [place] [address], [place], [place] [address]    Report Status  PENDING  Incomplete  Resp Panel by RT-PCR (Flu A&B, Covid) Urine, Clean Catch     Status: None   Collection Time: 09/16/22  9:48 PM   Specimen: Urine, Clean Catch; Nasal Swab  Result Value Ref Range Status   SARS Coronavirus 2 by RT PCR NEGATIVE NEGATIVE Final    Comment: (NOTE) SARS-CoV-2 target nucleic acids are NOT DETECTED.  The SARS-CoV-2 RNA is generally detectable in upper respiratory specimens during the acute phase of infection. The lowest concentration of SARS-CoV-2 viral copies this assay can detect is 138 copies/mL. A  negative result does not preclude SARS-Cov-2 infection and should not be used as the sole basis for treatment or other patient management decisions. A negative result may occur with  improper specimen collection/handling, submission of specimen other than nasopharyngeal swab, presence of viral mutation(s) within the areas targeted by this assay, and inadequate number of viral copies(<138 copies/mL). A negative result must be combined with clinical observations, patient history, and epidemiological information. The expected result is Negative.  Fact Sheet for Patients:  EntrepreneurPulse.com.au  Fact Sheet for Healthcare Providers:  IncredibleEmployment.be  This test is no t yet approved or cleared by the [place] FDA and  has been authorized for detection and/or diagnosis of SARS-CoV-2 by FDA under an Emergency Use Authorization (EUA). This EUA will remain  in effect (meaning this test can be used) for the duration of the COVID-19 declaration under Section 564(b)(1) of the Act, 21 U.S.C.section 360bbb-3(b)(1), unless the authorization is terminated  or revoked sooner.       Influenza A by PCR NEGATIVE NEGATIVE Final   Influenza B by PCR NEGATIVE NEGATIVE Final    Comment: (NOTE) The Xpert Xpress SARS-CoV-2/FLU/RSV plus assay is intended as an aid in the diagnosis of influenza from Nasopharyngeal swab specimens  and should not be used as a sole basis for treatment. Nasal washings and aspirates are unacceptable for Xpert Xpress SARS-CoV-2/FLU/RSV testing.  Fact Sheet for Patients: EntrepreneurPulse.com.au  Fact Sheet for Healthcare Providers: IncredibleEmployment.be  This test is not yet approved or cleared by the [place] FDA and has been authorized for detection and/or diagnosis of SARS-CoV-2 by FDA under an Emergency Use Authorization (EUA). This EUA will remain in effect (meaning this test can be used) for the duration of the COVID-19 declaration under Section 564(b)(1) of the Act, 21 U.S.C. section 360bbb-3(b)(1), unless the authorization is terminated or revoked.  Performed at [facility], [place] [address], [place], [place] [address]   C Difficile Quick Screen w PCR reflex     Status: Abnormal   Collection Time: 09/16/22 11:55 PM   Specimen: STOOL  Result Value Ref Range Status   C Diff antigen POSITIVE (A) NEGATIVE Final   C Diff toxin NEGATIVE NEGATIVE Final   C Diff interpretation Results are indeterminate. See PCR results.  Final    Comment: Performed at [facility], [place] [address] [place], [place] [address]  C. Diff by PCR, Reflexed     Status: Abnormal   Collection Time: 09/16/22 11:55 PM  Result Value Ref Range Status   Toxigenic C. Difficile by PCR POSITIVE (A) NEGATIVE Final    Comment: Positive for toxigenic C. difficile with little to no toxin production. Only treat if clinical presentation suggests symptomatic illness. Performed at [facility], [place] [address], [place], [place] [address]   Respiratory (~20 pathogens) panel by PCR     Status: None   Collection Time: 09/18/22 11:29 AM   Specimen: Nasopharyngeal Swab; Respiratory  Result Value Ref Range Status   Adenovirus NOT DETECTED NOT DETECTED Final   Coronavirus 229E NOT DETECTED NOT DETECTED Final    Comment: (NOTE) The Coronavirus on the Respiratory  Panel, DOES NOT test for the novel  Coronavirus (2019 nCoV)    Coronavirus HKU1 NOT DETECTED NOT DETECTED Final   Coronavirus NL63 NOT DETECTED NOT DETECTED Final   Coronavirus OC43 NOT DETECTED NOT DETECTED Final   Metapneumovirus NOT DETECTED NOT DETECTED Final   Rhinovirus / Enterovirus NOT DETECTED NOT DETECTED Final  Influenza A NOT DETECTED NOT DETECTED Final   Influenza B NOT DETECTED NOT DETECTED Final   Parainfluenza Virus 1 NOT DETECTED NOT DETECTED Final   Parainfluenza Virus 2 NOT DETECTED NOT DETECTED Final   Parainfluenza Virus 3 NOT DETECTED NOT DETECTED Final   Parainfluenza Virus 4 NOT DETECTED NOT DETECTED Final   Respiratory Syncytial Virus NOT DETECTED NOT DETECTED Final   Bordetella pertussis NOT DETECTED NOT DETECTED Final   Bordetella Parapertussis NOT DETECTED NOT DETECTED Final   Chlamydophila pneumoniae NOT DETECTED NOT DETECTED Final   Mycoplasma pneumoniae NOT DETECTED NOT DETECTED Final    Comment: Performed at [facility], [place] [address], [place], [place] [address]         Radiology Studies: DG Abd 1 View  Result Date: 09/18/2022 CLINICAL DATA:  Abdominal distension EXAM: ABDOMEN - 1 VIEW COMPARISON:  09/16/2022 FINDINGS: Scattered large and small bowel gas is noted. No obstructive changes are seen. No free air is noted. Degenerative changes of lumbar spine are seen. No abnormal mass is seen. IMPRESSION: No acute abnormality noted. Electronically Signed   By: Inez Catalina M.D.   On: 09/18/2022 19:11        Scheduled Meds:  apixaban  5 mg Oral BID   donepezil  5 mg Oral Daily   escitalopram  20 mg Oral Daily   ezetimibe  10 mg Oral Daily   feeding supplement  237 mL Oral TID BM   influenza vaccine adjuvanted  0.5 mL Intramuscular Tomorrow-1000   insulin aspart  0-5 Units Subcutaneous QHS   insulin aspart  0-9 Units Subcutaneous TID WC   mirtazapine  15 mg Oral QHS   saccharomyces boulardii  250 mg Oral BID   vancomycin  125 mg Oral  QID   Followed by   Derrill Memo ON 10/02/2022] vancomycin  125 mg Oral BID   Followed by   Derrill Memo ON 10/10/2022] vancomycin  125 mg Oral Daily   Followed by   Derrill Memo ON 10/17/2022] vancomycin  125 mg Oral QODAY   Followed by   Derrill Memo ON 10/25/2022] vancomycin  125 mg Oral Q3 days   Continuous Infusions:  sodium chloride 10 mL/hr at 09/19/22 0300     LOS: 4 days       Phillips Climes, MD [facility]   To contact the attending provider between 7A-7P or the covering provider during after hours 7P-7A, please log into the web site [email or web address] and access using universal [place] password for that web site. If you do not have the password, please call the hospital operator.  09/20/2022, 3:20 PM

## 2022-09-21 DIAGNOSIS — A0472 Enterocolitis due to Clostridium difficile, not specified as recurrent: Secondary | ICD-10-CM | POA: Diagnosis not present

## 2022-09-21 DIAGNOSIS — J690 Pneumonitis due to inhalation of food and vomit: Secondary | ICD-10-CM | POA: Diagnosis not present

## 2022-09-21 DIAGNOSIS — J9601 Acute respiratory failure with hypoxia: Secondary | ICD-10-CM | POA: Diagnosis not present

## 2022-09-21 LAB — CULTURE, BLOOD (ROUTINE X 2)
Culture: NO GROWTH
Culture: NO GROWTH
Special Requests: ADEQUATE

## 2022-09-21 LAB — GLUCOSE, CAPILLARY: Glucose-Capillary: 92 mg/dL (ref 70–99)

## 2022-09-21 MED ORDER — SACCHAROMYCES BOULARDII 250 MG PO CAPS: 250.0000 mg | ORAL_CAPSULE | Freq: Two times a day (BID) | ORAL | 2 refills | Status: DC

## 2022-09-21 MED ORDER — APIXABAN 5 MG PO TABS: 5.0000 mg | ORAL_TABLET | Freq: Two times a day (BID) | ORAL | Status: DC

## 2022-09-21 MED ORDER — VANCOMYCIN HCL 125 MG PO CAPS: ORAL_CAPSULE | ORAL | 0 refills | Status: AC

## 2022-09-21 NOTE — Progress Notes (Signed)
PROGRESS NOTE FOR [facility]  Subjective: Daughter reports that her father did not have any diarrhea overnight.  Objective: Vital signs in last 24 hours: Temp:  [98.1 F (36.7 C)-98.7 F (37.1 C)] 98.1 F (36.7 C) (10/08 0524) Pulse Rate:  [61-65] 61 (10/08 0830) Resp:  [16-20] 20 (10/08 0524) BP: (139-167)/(66-73) 167/73 (10/08 0524) SpO2:  [92 %-95 %] 95 % (10/08 0830) Last BM Date : 09/20/22  Intake/Output from previous day: 10/07 0701 - 10/08 0700 In: 720 [P.O.:720] Out: 1175 [Urine:1175] Intake/Output this shift: Total I/O In: 79.2 [I.V.:79.2] Out: -   General appearance: Sleeping GI: soft, non-tender; bowel sounds normal; no masses,  no organomegaly  Lab Results: Recent Labs    09/19/22 0148 09/20/22 0142  WBC 7.2 6.6  HGB 10.5* 11.8*  HCT 31.6* 36.9*  PLT 228 260   BMET Recent Labs    09/19/22 0148 09/20/22 0142  NA 138 142  K 4.2 4.4  CL 111 107  CO2 21* 27  GLUCOSE 111* 94  BUN 20 11  CREATININE 1.16 0.99  CALCIUM 8.6* 9.2   LFT No results for input(s): "PROT", "ALBUMIN", "AST", "ALT", "ALKPHOS", "BILITOT", "BILIDIR", "IBILI" in the last 72 hours. PT/INR No results for input(s): "LABPROT", "INR" in the last 72 hours. Hepatitis Panel No results for input(s): "HEPBSAG", "HCVAB", "HEPAIGM", "HEPBIGM" in the last 72 hours. C-Diff No results for input(s): "CDIFFTOX" in the last 72 hours. Fecal Lactopherrin No results for input(s): "FECLLACTOFRN" in the last 72 hours.  Studies/Results: No results found.  Medications: Scheduled:  apixaban  5 mg Oral BID   donepezil  5 mg Oral Daily   escitalopram  20 mg Oral Daily   ezetimibe  10 mg Oral Daily   feeding supplement  237 mL Oral TID BM   influenza vaccine adjuvanted  0.5 mL Intramuscular Tomorrow-1000   insulin aspart  0-5 Units Subcutaneous QHS   insulin aspart  0-9 Units Subcutaneous TID WC   mirtazapine  15 mg Oral QHS   saccharomyces boulardii  250 mg Oral BID   vancomycin  125 mg Oral  QID   Followed by   Derrill Memo ON 10/02/2022] vancomycin  125 mg Oral BID   Followed by   Derrill Memo ON 10/10/2022] vancomycin  125 mg Oral Daily   Followed by   Derrill Memo ON 10/17/2022] vancomycin  125 mg Oral QODAY   Followed by   Derrill Memo ON 10/25/2022] vancomycin  125 mg Oral Q3 days   Continuous:  sodium chloride Stopped (09/19/22 0959)    Assessment/Plan: 1) C. Diff colitis. 2) Dementia.   There is a positive response with the vancomycin.  Per the daughter, he is going to be discharged home.  Hopefully the resolution of his diarrhea will be durable.  Plan: 1) Continue with vanc. 2) Follow up with [facility].  LOS: 5 days   Armenta Erskin D 09/21/2022, 9:41 AM

## 2022-09-21 NOTE — Discharge Summary (Addendum)
Physician Discharge Summary  Paul [name] DGU:[id number] DOB: [birth year] DOA: 09/16/2022  PCP: Paul Infante, MD  Admit date: 09/16/2022 Discharge date: 09/21/2022  Admitted From: Home Disposition:  Home   Recommendations for Outpatient Follow-up:  Follow up with PCP in 1-2 weeks Please obtain BMP/CBC in one week Family were instructed to schedule follow-up with GI before end of his vancomycin taper  Home Health:YES   Discharge Condition:Stable CODE STATUS:FULL Diet recommendation: Dysphagia 3 with thin liquids  Brief/Interim Summary:   Paul CHUI is a 88 y.o. male with medical history significant for dementia, peripheral artery disease, history of bladder cancer, hypertension, hyperlipidemia, CVA, recent C. difficile infection June 20, 2022, history of pulmonary embolism on Eliquis, who presented to [facility] ED from home due to vomiting and diarrhea.  -His work-up was significant for left lung pneumonia, felt to be secondary to aspiration and profuse diarrhea, C. difficile is unfortunately positive again.      Acute hypoxic respiratory failure  Sepsis present on admission Sepsis related to aspiration pneumonia and C. difficile infection - Sepsis patient on admission, febrile 101, elevated lactic acid, tachypneic due to pneumonia, likely aspiration, and C. difficile infection -No oxygen requirement at baseline, initial saturation 78 to 82% on room air, quiring 4 L nasal cannula, but this has improved, he is currently back on room air. -Seen by SLP, moderate risk for aspiration -Was encouraged to use incentive spirometry and flutter valve. -Initially on cefepime  then Unasyn, all antibiotics has been discontinued now respiratory status has improved h and given his C. difficile infection .   Intractable nausea and diarrhea C. difficile diarrhea -Multiple episodes, with prolonged recent infection with C. difficile infection initially treated with Dificid then vancomycin as an  outpatient, has been stopped over last few days, it does appear now with another C. difficile infection on presentation, has been seen by both GI and ID, and he will be discharged on prolonged vancomycin taper  Dysphagia, worsened with liquids Please see above discussion   Hypophosphatemia -Repleted   Generalized weakness Failure to thrive PT/OT consulted   History of PE on Eliquis Resume home Eliquis        Discharge Diagnoses:  Principal Problem:   Acute respiratory failure with hypoxia ([place]) Active Problems:   C. difficile colitis   Aspiration pneumonitis [facility])    Discharge Instructions  Discharge Instructions     Diet - low sodium heart healthy   Complete by: As directed    Discharge instructions   Complete by: As directed    Follow with Primary MD Paul Infante, MD in 7 days   Get CBC, CMP,  checked  by Primary MD next visit.    Activity: As tolerated with Full fall precautions use walker/cane & assistance as needed   Disposition Home    Diet: Dysphagia 3 with thin liquids  On your next visit with your primary care physician please Get Medicines reviewed and adjusted.   Please request your Prim.MD to go over all Hospital Tests and Procedure/Radiological results at the follow up, please get all Hospital records sent to your Prim MD by signing hospital release before you go home.   If you experience worsening of your admission symptoms, develop shortness of breath, life threatening emergency, suicidal or homicidal thoughts you must seek medical attention immediately by calling 911 or calling your MD immediately  if symptoms less severe.  You Must read complete instructions/literature along with all the possible adverse reactions/side effects for all the Medicines  you take and that have been prescribed to you. Take any new Medicines after you have completely understood and accpet all the possible adverse reactions/side effects.   Do not drive, operating  heavy machinery, perform activities at heights, swimming or participation in water activities or provide baby sitting services if your were admitted for syncope or siezures until you have seen by Primary MD or a Neurologist and advised to do so again.  Do not drive when taking Pain medications.    Do not take more than prescribed Pain, Sleep and Anxiety Medications  Special Instructions: If you have smoked or chewed Tobacco  in the last 2 yrs please stop smoking, stop any regular Alcohol  and or any Recreational drug use.  Wear Seat belts while driving.   Please note  You were cared for by a hospitalist during your hospital stay. If you have any questions about your discharge medications or the care you received while you were in the hospital after you are discharged, you can call the unit and asked to speak with the hospitalist on call if the hospitalist that took care of you is not available. Once you are discharged, your primary care physician will handle any further medical issues. Please note that NO REFILLS for any discharge medications will be authorized once you are discharged, as it is imperative that you return to your primary care physician (or establish a relationship with a primary care physician if you do not have one) for your aftercare needs so that they can reassess your need for medications and monitor your lab values.   Increase activity slowly   Complete by: As directed       Allergies as of 09/21/2022       Reactions   Livalo [pitavastatin] Other (See Comments)   Cramping and back pain   Haldol [haloperidol] Other (See Comments)   Family does not like his affect when he is given this - makes him a "zombie."   Other Diarrhea, Other (See Comments)   Unnamed patch and tablets for dementia caused diarrhea (Possibly galantamine, from outside source)         Medication List     TAKE these medications    acetaminophen 500 MG tablet Commonly known as: TYLENOL Take  500-1,000 mg by mouth daily as needed for mild pain or headache.   apixaban 5 MG Tabs tablet Commonly known as: Eliquis Take 1 tablet (5 mg total) by mouth 2 (two) times daily.   donepezil 5 MG tablet Commonly known as: ARICEPT Take 5 mg by mouth daily.   Ensure Take 237 mLs by mouth daily.   escitalopram 20 MG tablet Commonly known as: LEXAPRO Take 20 mg by mouth daily.   ezetimibe 10 MG tablet Commonly known as: ZETIA Take 10 mg by mouth daily.   isosorbide mononitrate 60 MG 24 hr tablet Commonly known as: IMDUR Take 1 tablet (60 mg total) by mouth every morning. do not give if systolic blood pressure < 130 What changed:  when to take this additional instructions   loperamide 2 MG capsule Commonly known as: IMODIUM Take 2 mg by mouth once.   PROBIOTIC PO Take 1 tablet by mouth daily.   saccharomyces boulardii 250 MG capsule Commonly known as: Florastor Take 1 capsule (250 mg total) by mouth 2 (two) times daily.   vancomycin 125 MG capsule Commonly known as: VANCOCIN Take 1 capsule (125 mg total) by mouth 4 (four) times daily for 11 days, THEN 1 capsule (  125 mg total) 2 (two) times daily for 7 days, THEN 1 capsule (125 mg total) daily for 7 days, THEN 1 capsule (125 mg total) every other day for 7 days, THEN 1 capsule (125 mg total) every 3 (three) days for 7 days. Start taking on: September 21, 2022 What changed: See the new instructions.        Follow-up Information     [facility] [place] Follow [facility].   Specialty: Home Health Services Why: Physical and Occupational Orders-office to call with visit times. Contact information: [address] [place] [address] [phone]                Allergies  Allergen Reactions   Livalo [Pitavastatin] Other (See Comments)    Cramping and back pain   Haldol [Haloperidol] Other (See Comments)    Family does not like his affect when he is given this - makes him a "zombie."   Other Diarrhea and  Other (See Comments)    Unnamed patch and tablets for dementia caused diarrhea (Possibly galantamine, from outside source)     Consultations: ID GI   Procedures/Studies: DG Abd 1 View  Result Date: 09/18/2022 CLINICAL DATA:  Abdominal distension EXAM: ABDOMEN - 1 VIEW COMPARISON:  09/16/2022 FINDINGS: Scattered large and small bowel gas is noted. No obstructive changes are seen. No free air is noted. Degenerative changes of lumbar spine are seen. No abnormal mass is seen. IMPRESSION: No acute abnormality noted. Electronically Signed   By: Inez Catalina M.D.   On: 09/18/2022 19:11   CT Angio Chest Pulmonary Embolism (PE) W or WO Contrast  Result Date: 09/16/2022 CLINICAL DATA:  Vomiting, diarrhea, fever, recent C diff, history of PE EXAM: CT ANGIOGRAPHY CHEST CT ABDOMEN AND PELVIS WITH CONTRAST TECHNIQUE: Multidetector CT imaging of the chest was performed using the standard protocol during bolus administration of intravenous contrast. Multiplanar CT image reconstructions and MIPs were obtained to evaluate the vascular anatomy. Multidetector CT imaging of the abdomen and pelvis was performed using the standard protocol during bolus administration of intravenous contrast. RADIATION DOSE REDUCTION: This exam was performed according to the departmental dose-optimization program which includes automated exposure control, adjustment of the mA and/or kV according to patient size and/or use of iterative reconstruction technique. CONTRAST:  145m OMNIPAQUE IOHEXOL 350 MG/ML SOLN COMPARISON:  CTA chest dated 07/29/2022. CT chest abdomen pelvis dated 06/20/2022. FINDINGS: CTA CHEST FINDINGS Cardiovascular: Satisfactory opacification the bilateral pulmonary arteries to the segmental level. No evidence of pulmonary embolism. Specifically, the prior segmental left lower lobe pulmonary embolism has resolved. Although not tailored for evaluation of the thoracic aorta, there is no evidence thoracic aortic aneurysm or  dissection. Atherosclerotic calcifications of the arch. The heart is normal in size.  No pericardial effusion. Mild coronary sclerosis of the LAD and right coronary artery. Mediastinum/Nodes: No suspicious mediastinal lymphadenopathy. Visualized thyroid is unremarkable. Lungs/Pleura: Rounded atelectasis/collapse in the left lower lobe. Associated moderate chronic left pleural effusion with pleural thickening, similar to the prior. Mild patchy right lower lobe opacity, likely atelectasis. New patchy/nodular opacities in the posterior left upper lobe (series 7/image 37), suggesting mild infection/pneumonia. Evaluation lung parenchyma is constrained by respiratory motion. Within that constraint, there are no suspicious pulmonary nodules. Mild centrilobular emphysematous changes, upper lobe predominant. No pneumothorax. Musculoskeletal: Moderate degenerative changes of the thoracic spine. Review of the MIP images confirms the above findings. CT ABDOMEN and PELVIS FINDINGS Motion degraded images. Hepatobiliary: 10 mm cyst versus hemangioma in the central  right liver (series 3/image 26), benign. Gallbladder is unremarkable. No intrahepatic or extrahepatic duct dilatation. Pancreas: Within normal limits. Spleen: Within normal limits. Adrenals/Urinary Tract: Adrenal glands are within normal limits. Right renal scarring. Left kidney is within normal limits. No hydronephrosis. Bladder is mildly thick-walled although underdistended. Stomach/Bowel: Stomach is within normal limits. No evidence of bowel obstruction. Appendix is not discretely visualized. Sigmoid diverticulosis, without evidence of diverticulitis. No colonic wall thickening or inflammatory changes in this patient with recent history of C diff. Vascular/Lymphatic: No evidence of abdominal aortic aneurysm. Atherosclerotic calcifications of the abdominal aorta and branch vessels. No suspicious abdominopelvic lymphadenopathy. Reproductive: Prostatomegaly, suggesting  BPH. Other: No abdominopelvic ascites. Tiny fat containing left inguinal hernia. Musculoskeletal: Moderate degenerative changes of the lumbar spine. Review of the MIP images confirms the above findings. IMPRESSION: No evidence of pulmonary embolism. Specifically, the prior segmental left lower lobe pulmonary embolism has resolved. New patchy/nodular opacities in the posterior left upper lobe, suggesting mild infection/pneumonia. Rounded atelectasis/collapse in the left lower lobe. Associated moderate chronic left pleural effusion with pleural thickening. Both findings are chronic. No colonic wall thickening or inflammatory changes in this patient with recent history of C diff. Sigmoid diverticulosis, without evidence of diverticulitis. Additional stable ancillary findings as above. Aortic Atherosclerosis (ICD10-I70.0) and Emphysema (ICD10-J43.9). Electronically Signed   By: Julian Hy M.D.   On: 09/16/2022 23:01   CT ABDOMEN PELVIS W CONTRAST  Result Date: 09/16/2022 CLINICAL DATA:  Vomiting, diarrhea, fever, recent C diff, history of PE EXAM: CT ANGIOGRAPHY CHEST CT ABDOMEN AND PELVIS WITH CONTRAST TECHNIQUE: Multidetector CT imaging of the chest was performed using the standard protocol during bolus administration of intravenous contrast. Multiplanar CT image reconstructions and MIPs were obtained to evaluate the vascular anatomy. Multidetector CT imaging of the abdomen and pelvis was performed using the standard protocol during bolus administration of intravenous contrast. RADIATION DOSE REDUCTION: This exam was performed according to the departmental dose-optimization program which includes automated exposure control, adjustment of the mA and/or kV according to patient size and/or use of iterative reconstruction technique. CONTRAST:  184m OMNIPAQUE IOHEXOL 350 MG/ML SOLN COMPARISON:  CTA chest dated 07/29/2022. CT chest abdomen pelvis dated 06/20/2022. FINDINGS: CTA CHEST FINDINGS Cardiovascular:  Satisfactory opacification the bilateral pulmonary arteries to the segmental level. No evidence of pulmonary embolism. Specifically, the prior segmental left lower lobe pulmonary embolism has resolved. Although not tailored for evaluation of the thoracic aorta, there is no evidence thoracic aortic aneurysm or dissection. Atherosclerotic calcifications of the arch. The heart is normal in size.  No pericardial effusion. Mild coronary sclerosis of the LAD and right coronary artery. Mediastinum/Nodes: No suspicious mediastinal lymphadenopathy. Visualized thyroid is unremarkable. Lungs/Pleura: Rounded atelectasis/collapse in the left lower lobe. Associated moderate chronic left pleural effusion with pleural thickening, similar to the prior. Mild patchy right lower lobe opacity, likely atelectasis. New patchy/nodular opacities in the posterior left upper lobe (series 7/image 37), suggesting mild infection/pneumonia. Evaluation lung parenchyma is constrained by respiratory motion. Within that constraint, there are no suspicious pulmonary nodules. Mild centrilobular emphysematous changes, upper lobe predominant. No pneumothorax. Musculoskeletal: Moderate degenerative changes of the thoracic spine. Review of the MIP images confirms the above findings. CT ABDOMEN and PELVIS FINDINGS Motion degraded images. Hepatobiliary: 10 mm cyst versus hemangioma in the central right liver (series 3/image 26), benign. Gallbladder is unremarkable. No intrahepatic or extrahepatic duct dilatation. Pancreas: Within normal limits. Spleen: Within normal limits. Adrenals/Urinary Tract: Adrenal glands are within normal limits. Right renal scarring. Left kidney is within  normal limits. No hydronephrosis. Bladder is mildly thick-walled although underdistended. Stomach/Bowel: Stomach is within normal limits. No evidence of bowel obstruction. Appendix is not discretely visualized. Sigmoid diverticulosis, without evidence of diverticulitis. No colonic  wall thickening or inflammatory changes in this patient with recent history of C diff. Vascular/Lymphatic: No evidence of abdominal aortic aneurysm. Atherosclerotic calcifications of the abdominal aorta and branch vessels. No suspicious abdominopelvic lymphadenopathy. Reproductive: Prostatomegaly, suggesting BPH. Other: No abdominopelvic ascites. Tiny fat containing left inguinal hernia. Musculoskeletal: Moderate degenerative changes of the lumbar spine. Review of the MIP images confirms the above findings. IMPRESSION: No evidence of pulmonary embolism. Specifically, the prior segmental left lower lobe pulmonary embolism has resolved. New patchy/nodular opacities in the posterior left upper lobe, suggesting mild infection/pneumonia. Rounded atelectasis/collapse in the left lower lobe. Associated moderate chronic left pleural effusion with pleural thickening. Both findings are chronic. No colonic wall thickening or inflammatory changes in this patient with recent history of C diff. Sigmoid diverticulosis, without evidence of diverticulitis. Additional stable ancillary findings as above. Aortic Atherosclerosis (ICD10-I70.0) and Emphysema (ICD10-J43.9). Electronically Signed   By: Julian Hy M.D.   On: 09/16/2022 23:01   DG Abd 2 Views  Result Date: 09/16/2022 CLINICAL DATA:  Nausea and vomiting EXAM: ABDOMEN - 2 VIEW COMPARISON:  None Available. FINDINGS: There is a large air-fluid level in the stomach. No other dilated bowel loops are seen. Air seen to the level the rectum. There are phleboliths in the pelvis. There is a small left pleural effusion. Degenerative changes affect the spine. IMPRESSION: There is a large air-fluid level in the stomach. No other dilated bowel loops are seen. Electronically Signed   By: Ronney Asters M.D.   On: 09/16/2022 22:10   DG Chest Port 1 View  Result Date: 09/16/2022 CLINICAL DATA:  Vomiting, diarrhea. EXAM: PORTABLE CHEST 1 VIEW COMPARISON:  July 29, 2022.  FINDINGS: The heart size and mediastinal contours are within normal limits. Mild right basilar atelectasis or scarring is noted. Stable left pleural effusion is noted with associated left lower lobe pneumonia or atelectasis. The visualized skeletal structures are unremarkable. IMPRESSION: Stable left pleural effusion is noted with associated left lower lobe pneumonia or atelectasis. Mild right basilar atelectasis or scarring is noted. Electronically Signed   By: Marijo Conception M.D.   On: 09/16/2022 21:24      Subjective:  Daughter at bedside, reported diarrhea has significantly improved over last 24 hours, currently having formed bowel movements, still having poor night sleep.  Discharge Exam: Vitals:   09/21/22 0524 09/21/22 0830  BP: (!) 167/73   Pulse: 63 61  Resp: 20   Temp: 98.1 F (36.7 C)   SpO2: 92% 95%   Vitals:   09/20/22 1440 09/20/22 1951 09/21/22 0524 09/21/22 0830  BP: (!) 155/68 139/66 (!) 167/73   Pulse: 65 63 63 61  Resp: '16 19 20   '$ Temp: 98.7 F (37.1 C) 98.7 F (37.1 C) 98.1 F (36.7 C)   TempSrc: Oral Oral Axillary   SpO2: 93% 95% 92% 95%  Weight:        General: Frail, sleeping comfortably Cardiovascular: RRR, S1/S2 +, no rubs, no gallops Respiratory: CTA bilaterally, no wheezing, no rhonchi Abdominal: Soft, NT, ND, bowel sounds + Extremities: no edema, no cyanosis    The results of significant diagnostics from this hospitalization (including imaging, microbiology, ancillary and laboratory) are listed below for reference.     Microbiology: Recent Results (from the past 240 hour(s))  Urine Culture  Status: Abnormal   Collection Time: 09/16/22  8:53 PM   Specimen: In/Out Cath Urine  Result Value Ref Range Status   Specimen Description IN/OUT CATH URINE  Final   Special Requests   Final    NONE Performed at [facility], [address], [place], [place] [address]    Culture MULTIPLE SPECIES PRESENT, SUGGEST RECOLLECTION (A)  Final    Report Status 09/17/2022 FINAL  Final  Blood culture (routine x 2)     Status: None   Collection Time: 09/16/22  8:58 PM   Specimen: BLOOD  Result Value Ref Range Status   Specimen Description BLOOD BLOOD LEFT HAND  Final   Special Requests   Final    BOTTLES DRAWN AEROBIC AND ANAEROBIC Blood Culture adequate volume   Culture   Final    NO GROWTH 5 DAYS Performed at [facility], [place] [address] [place], [place] [address]    Report Status 09/21/2022 FINAL  Final  Blood culture (routine x 2)     Status: None   Collection Time: 09/16/22  9:39 PM   Specimen: BLOOD  Result Value Ref Range Status   Specimen Description BLOOD RIGHT ANTECUBITAL  Final   Special Requests   Final    BOTTLES DRAWN AEROBIC AND ANAEROBIC Blood Culture results may not be optimal due to an excessive volume of blood received in culture bottles   Culture   Final    NO GROWTH 5 DAYS Performed at [facility], [place] [address], [place],  [address]    Report Status 09/21/2022 FINAL  Final  Resp Panel by RT-PCR (Flu A&B, Covid) Urine, Clean Catch     Status: None   Collection Time: 09/16/22  9:48 PM   Specimen: Urine, Clean Catch; Nasal Swab  Result Value Ref Range Status   SARS Coronavirus 2 by RT PCR NEGATIVE NEGATIVE Final    Comment: (NOTE) SARS-CoV-2 target nucleic acids are NOT DETECTED.  The SARS-CoV-2 RNA is generally detectable in upper respiratory specimens during the acute phase of infection. The lowest concentration of SARS-CoV-2 viral copies this assay can detect is 138 copies/mL. A negative result does not preclude SARS-Cov-2 infection and should not be used as the sole basis for treatment or other patient management decisions. A negative result may occur with  improper specimen collection/handling, submission of specimen other than nasopharyngeal swab, presence of viral mutation(s) within the areas targeted by this assay, and inadequate number of viral copies(<138 copies/mL). A  negative result must be combined with clinical observations, patient history, and epidemiological information. The expected result is Negative.  Fact Sheet for Patients:  EntrepreneurPulse.com.au  Fact Sheet for Healthcare Providers:  IncredibleEmployment.be  This test is no t yet approved or cleared by the [place] FDA and  has been authorized for detection and/or diagnosis of SARS-CoV-2 by FDA under an Emergency Use Authorization (EUA). This EUA will remain  in effect (meaning this test can be used) for the duration of the COVID-19 declaration under Section 564(b)(1) of the Act, 21 U.S.C.section 360bbb-3(b)(1), unless the authorization is terminated  or revoked sooner.       Influenza A by PCR NEGATIVE NEGATIVE Final   Influenza B by PCR NEGATIVE NEGATIVE Final    Comment: (NOTE) The Xpert Xpress SARS-CoV-2/FLU/RSV plus assay is intended as an aid in the diagnosis of influenza from Nasopharyngeal swab specimens and should not be used as a sole basis for treatment. Nasal washings and aspirates are unacceptable  for Xpert Xpress SARS-CoV-2/FLU/RSV testing.  Fact Sheet for Patients: EntrepreneurPulse.com.au  Fact Sheet for Healthcare Providers: IncredibleEmployment.be  This test is not yet approved or cleared by the [place] FDA and has been authorized for detection and/or diagnosis of SARS-CoV-2 by FDA under an Emergency Use Authorization (EUA). This EUA will remain in effect (meaning this test can be used) for the duration of the COVID-19 declaration under Section 564(b)(1) of the Act, 21 U.S.C. section 360bbb-3(b)(1), unless the authorization is terminated or revoked.  Performed at [facility], [place] [address], [place], [place] [address]   C Difficile Quick Screen w PCR reflex     Status: Abnormal   Collection Time: 09/16/22 11:55 PM   Specimen: STOOL  Result Value Ref Range Status    C Diff antigen POSITIVE (A) NEGATIVE Final   C Diff toxin NEGATIVE NEGATIVE Final   C Diff interpretation Results are indeterminate. See PCR results.  Final    Comment: Performed at [facility], [place] [address], [place], [place] [address]  C. Diff by PCR, Reflexed     Status: Abnormal   Collection Time: 09/16/22 11:55 PM  Result Value Ref Range Status   Toxigenic C. Difficile by PCR POSITIVE (A) NEGATIVE Final    Comment: Positive for toxigenic C. difficile with little to no toxin production. Only treat if clinical presentation suggests symptomatic illness. Performed at [facility], [place] [address] [place], [place] [address]   Respiratory (~20 pathogens) panel by PCR     Status: None   Collection Time: 09/18/22 11:29 AM   Specimen: Nasopharyngeal Swab; Respiratory  Result Value Ref Range Status   Adenovirus NOT DETECTED NOT DETECTED Final   Coronavirus 229E NOT DETECTED NOT DETECTED Final    Comment: (NOTE) The Coronavirus on the Respiratory Panel, DOES NOT test for the novel  Coronavirus (2019 nCoV)    Coronavirus HKU1 NOT DETECTED NOT DETECTED Final   Coronavirus NL63 NOT DETECTED NOT DETECTED Final   Coronavirus OC43 NOT DETECTED NOT DETECTED Final   Metapneumovirus NOT DETECTED NOT DETECTED Final   Rhinovirus / Enterovirus NOT DETECTED NOT DETECTED Final   Influenza A NOT DETECTED NOT DETECTED Final   Influenza B NOT DETECTED NOT DETECTED Final   Parainfluenza Virus 1 NOT DETECTED NOT DETECTED Final   Parainfluenza Virus 2 NOT DETECTED NOT DETECTED Final   Parainfluenza Virus 3 NOT DETECTED NOT DETECTED Final   Parainfluenza Virus 4 NOT DETECTED NOT DETECTED Final   Respiratory Syncytial Virus NOT DETECTED NOT DETECTED Final   Bordetella pertussis NOT DETECTED NOT DETECTED Final   Bordetella Parapertussis NOT DETECTED NOT DETECTED Final   Chlamydophila pneumoniae NOT DETECTED NOT DETECTED Final   Mycoplasma pneumoniae NOT DETECTED NOT DETECTED Final     Comment: Performed at [facility], [place]. [address] [place], [place] [address]     Labs: BNP (last 3 results) Recent Labs    06/10/22 1113 07/29/22 1857  BNP 167.0* 24.2   Basic Metabolic Panel: Recent Labs  Lab 09/16/22 2055 09/17/22 0350 09/18/22 0210 09/19/22 0148 09/20/22 0142  NA 140 136 135 138 142  K 3.9 4.1 3.7 4.2 4.4  CL 107 107 106 111 107  CO2 20* 22 22 21* 27  GLUCOSE 185* 135* 85 111* 94  BUN '21 19 21 20 11  '$ CREATININE 1.20 1.27* 1.23 1.16 0.99  CALCIUM 9.2 8.5* 8.0* 8.6* 9.2  MG  --  1.7  --   --   --  PHOS  --  1.7* 2.5 2.9  --    Liver Function Tests: Recent Labs  Lab 09/16/22 2055 09/17/22 0350  AST 22 23  ALT 14 14  ALKPHOS 75 58  BILITOT 0.7 0.6  PROT 6.7 5.7*  ALBUMIN 3.9 3.2*   No results for input(s): "LIPASE", "AMYLASE" in the last 168 hours. No results for input(s): "AMMONIA" in the last 168 hours. CBC: Recent Labs  Lab 09/16/22 2055 09/17/22 0350 09/18/22 0210 09/19/22 0148 09/20/22 0142  WBC 14.2* 7.5 7.4 7.2 6.6  NEUTROABS 12.7* 6.6  --   --   --   HGB 13.4 12.4* 10.1* 10.5* 11.8*  HCT 43.2 39.3 30.8* 31.6* 36.9*  MCV 90.9 89.9 87.5 85.6 87.2  PLT 351 253 189 228 260   Cardiac Enzymes: No results for input(s): "CKTOTAL", "CKMB", "CKMBINDEX", "TROPONINI" in the last 168 hours. BNP: Invalid input(s): "POCBNP" CBG: Recent Labs  Lab 09/20/22 0758 09/20/22 1141 09/20/22 1641 09/20/22 2116 09/21/22 0821  GLUCAP 80 116* 186* 81 92   D-Dimer No results for input(s): "DDIMER" in the last 72 hours. Hgb A1c No results for input(s): "HGBA1C" in the last 72 hours. Lipid Profile No results for input(s): "CHOL", "HDL", "LDLCALC", "TRIG", "CHOLHDL", "LDLDIRECT" in the last 72 hours. Thyroid function studies No results for input(s): "TSH", "T4TOTAL", "T3FREE", "THYROIDAB" in the last 72 hours.  Invalid input(s): "FREET3" Anemia work up No results for input(s): "VITAMINB12", "FOLATE", "FERRITIN", "TIBC", "IRON",  "RETICCTPCT" in the last 72 hours. Urinalysis    Component Value Date/Time   COLORURINE YELLOW 09/16/2022 2147   APPEARANCEUR HAZY (A) 09/16/2022 2147   LABSPEC 1.024 09/16/2022 2147   PHURINE 5.0 09/16/2022 2147   GLUCOSEU NEGATIVE 09/16/2022 2147   HGBUR NEGATIVE 09/16/2022 2147   BILIRUBINUR NEGATIVE 09/16/2022 2147   KETONESUR 5 (A) 09/16/2022 2147   PROTEINUR 100 (A) 09/16/2022 2147   NITRITE NEGATIVE 09/16/2022 2147   LEUKOCYTESUR NEGATIVE 09/16/2022 2147   Sepsis Labs Recent Labs  Lab 09/17/22 0350 09/18/22 0210 09/19/22 0148 09/20/22 0142  WBC 7.5 7.4 7.2 6.6   Microbiology Recent Results (from the past 240 hour(s))  Urine Culture     Status: Abnormal   Collection Time: 09/16/22  8:53 PM   Specimen: In/Out Cath Urine  Result Value Ref Range Status   Specimen Description IN/OUT CATH URINE  Final   Special Requests   Final    NONE Performed at [facility], [place] [address] [place], [place] [address]    Culture MULTIPLE SPECIES PRESENT, SUGGEST RECOLLECTION (A)  Final   Report Status 09/17/2022 FINAL  Final  Blood culture (routine x 2)     Status: None   Collection Time: 09/16/22  8:58 PM   Specimen: BLOOD  Result Value Ref Range Status   Specimen Description BLOOD BLOOD LEFT HAND  Final   Special Requests   Final    BOTTLES DRAWN AEROBIC AND ANAEROBIC Blood Culture adequate volume   Culture   Final    NO GROWTH 5 DAYS Performed at [facility], [place] [address], [place], [place] [address]    Report Status 09/21/2022 FINAL  Final  Blood culture (routine x 2)     Status: None   Collection Time: 09/16/22  9:39 PM   Specimen: BLOOD  Result Value Ref Range Status   Specimen Description BLOOD RIGHT ANTECUBITAL  Final   Special Requests   Final    BOTTLES DRAWN AEROBIC AND ANAEROBIC Blood Culture results may not  be optimal due to an excessive volume of blood received in culture bottles   Culture   Final    NO GROWTH 5 DAYS Performed at [facility], [place] [address] [place], [place] [address]    Report Status 09/21/2022 FINAL  Final  Resp Panel by RT-PCR (Flu A&B, Covid) Urine, Clean Catch     Status: None   Collection Time: 09/16/22  9:48 PM   Specimen: Urine, Clean Catch; Nasal Swab  Result Value Ref Range Status   SARS Coronavirus 2 by RT PCR NEGATIVE NEGATIVE Final    Comment: (NOTE) SARS-CoV-2 target nucleic acids are NOT DETECTED.  The SARS-CoV-2 RNA is generally detectable in upper respiratory specimens during the acute phase of infection. The lowest concentration of SARS-CoV-2 viral copies this assay can detect is 138 copies/mL. A negative result does not preclude SARS-Cov-2 infection and should not be used as the sole basis for treatment or other patient management decisions. A negative result may occur with  improper specimen collection/handling, submission of specimen other than nasopharyngeal swab, presence of viral mutation(s) within the areas targeted by this assay, and inadequate number of viral copies(<138 copies/mL). A negative result must be combined with clinical observations, patient history, and epidemiological information. The expected result is Negative.  Fact Sheet for Patients:  EntrepreneurPulse.com.au  Fact Sheet for Healthcare Providers:  IncredibleEmployment.be  This test is no t yet approved or cleared by the [place] FDA and  has been authorized for detection and/or diagnosis of SARS-CoV-2 by FDA under an Emergency Use Authorization (EUA). This EUA will remain  in effect (meaning this test can be used) for the duration of the COVID-19 declaration under Section 564(b)(1) of the Act, 21 U.S.C.section 360bbb-3(b)(1), unless the authorization is terminated  or revoked sooner.       Influenza A by PCR NEGATIVE NEGATIVE Final   Influenza B by PCR NEGATIVE NEGATIVE Final    Comment: (NOTE) The Xpert Xpress SARS-CoV-2/FLU/RSV plus assay is intended  as an aid in the diagnosis of influenza from Nasopharyngeal swab specimens and should not be used as a sole basis for treatment. Nasal washings and aspirates are unacceptable for Xpert Xpress SARS-CoV-2/FLU/RSV testing.  Fact Sheet for Patients: EntrepreneurPulse.com.au  Fact Sheet for Healthcare Providers: IncredibleEmployment.be  This test is not yet approved or cleared by the [place] FDA and has been authorized for detection and/or diagnosis of SARS-CoV-2 by FDA under an Emergency Use Authorization (EUA). This EUA will remain in effect (meaning this test can be used) for the duration of the COVID-19 declaration under Section 564(b)(1) of the Act, 21 U.S.C. section 360bbb-3(b)(1), unless the authorization is terminated or revoked.  Performed at [facility], [place] [address], [place], [place] [address]   C Difficile Quick Screen w PCR reflex     Status: Abnormal   Collection Time: 09/16/22 11:55 PM   Specimen: STOOL  Result Value Ref Range Status   C Diff antigen POSITIVE (A) NEGATIVE Final   C Diff toxin NEGATIVE NEGATIVE Final   C Diff interpretation Results are indeterminate. See PCR results.  Final    Comment: Performed at [facility], [place] [address], [place], [place] [address]  C. Diff by PCR, Reflexed     Status: Abnormal   Collection Time: 09/16/22 11:55 PM  Result Value Ref Range Status   Toxigenic C. Difficile by PCR POSITIVE (A) NEGATIVE Final    Comment: Positive for toxigenic C. difficile with little to no  toxin production. Only treat if clinical presentation suggests symptomatic illness. Performed at [facility], [place] [address] [place],  [address]   Respiratory (~20 pathogens) panel by PCR     Status: None   Collection Time: 09/18/22 11:29 AM   Specimen: Nasopharyngeal Swab; Respiratory  Result Value Ref Range Status   Adenovirus NOT DETECTED NOT DETECTED Final   Coronavirus 229E NOT DETECTED  NOT DETECTED Final    Comment: (NOTE) The Coronavirus on the Respiratory Panel, DOES NOT test for the novel  Coronavirus (2019 nCoV)    Coronavirus HKU1 NOT DETECTED NOT DETECTED Final   Coronavirus NL63 NOT DETECTED NOT DETECTED Final   Coronavirus OC43 NOT DETECTED NOT DETECTED Final   Metapneumovirus NOT DETECTED NOT DETECTED Final   Rhinovirus / Enterovirus NOT DETECTED NOT DETECTED Final   Influenza A NOT DETECTED NOT DETECTED Final   Influenza B NOT DETECTED NOT DETECTED Final   Parainfluenza Virus 1 NOT DETECTED NOT DETECTED Final   Parainfluenza Virus 2 NOT DETECTED NOT DETECTED Final   Parainfluenza Virus 3 NOT DETECTED NOT DETECTED Final   Parainfluenza Virus 4 NOT DETECTED NOT DETECTED Final   Respiratory Syncytial Virus NOT DETECTED NOT DETECTED Final   Bordetella pertussis NOT DETECTED NOT DETECTED Final   Bordetella Parapertussis NOT DETECTED NOT DETECTED Final   Chlamydophila pneumoniae NOT DETECTED NOT DETECTED Final   Mycoplasma pneumoniae NOT DETECTED NOT DETECTED Final    Comment: Performed at [facility], [place]. [address], [place],  [address]     Time coordinating discharge: Over 30 minutes  SIGNED:   Phillips Climes, MD  [facility] 09/21/2022, 10:34 AM Pager   If 7PM-7AM, please contact night-coverage [email or web address]

## 2022-09-21 NOTE — Discharge Instructions (Signed)
Follow with Primary MD Crist Infante, MD in 7 days   Get CBC, CMP,  checked  by Primary MD next visit.    Activity: As tolerated with Full fall precautions use walker/cane & assistance as needed   Disposition Home    Diet: Dysphagia 3 with thin liquids  On your next visit with your primary care physician please Get Medicines reviewed and adjusted.   Please request your Prim.MD to go over all Hospital Tests and Procedure/Radiological results at the follow up, please get all Hospital records sent to your Prim MD by signing hospital release before you go home.   If you experience worsening of your admission symptoms, develop shortness of breath, life threatening emergency, suicidal or homicidal thoughts you must seek medical attention immediately by calling 911 or calling your MD immediately  if symptoms less severe.  You Must read complete instructions/literature along with all the possible adverse reactions/side effects for all the Medicines you take and that have been prescribed to you. Take any new Medicines after you have completely understood and accpet all the possible adverse reactions/side effects.   Do not drive, operating heavy machinery, perform activities at heights, swimming or participation in water activities or provide baby sitting services if your were admitted for syncope or siezures until you have seen by Primary MD or a Neurologist and advised to do so again.  Do not drive when taking Pain medications.    Do not take more than prescribed Pain, Sleep and Anxiety Medications  Special Instructions: If you have smoked or chewed Tobacco  in the last 2 yrs please stop smoking, stop any regular Alcohol  and or any Recreational drug use.  Wear Seat belts while driving.   Please note  You were cared for by a hospitalist during your hospital stay. If you have any questions about your discharge medications or the care you received while you were in the hospital after you  are discharged, you can call the unit and asked to speak with the hospitalist on call if the hospitalist that took care of you is not available. Once you are discharged, your primary care physician will handle any further medical issues. Please note that NO REFILLS for any discharge medications will be authorized once you are discharged, as it is imperative that you return to your primary care physician (or establish a relationship with a primary care physician if you do not have one) for your aftercare needs so that they can reassess your need for medications and monitor your lab values.

## 2022-09-21 NOTE — Plan of Care (Signed)

## 2022-09-21 NOTE — TOC Transition Note (Signed)
Transition of Care (TOC) - CM/SW Discharge Note Marvetta Gibbons RN, BSN Transitions of Care Unit 4E- RN Case Manager See Treatment Team for direct phone #  Weekend cross coverage [phone]  Patient Details  Name: Paul [name] MRN: [id number] Date of Birth: 1930-10-22  Transition of Care [facility]) CM/SW Contact:  Dawayne Patricia, RN Phone Number: 09/21/2022, 10:33 AM   Clinical Narrative:    Pt stable for transition home today, [place] orders PT/OT/aide  for resumption of [place] services have been placed. No further TOC needs noted.    Final next level of care: [place] Barriers to Discharge: Barriers Resolved   Patient Goals and CMS Choice     Choice offered to / list presented to :  (patient is currently active with [facility])  Discharge Placement               Home w/ [facility]        Discharge Plan and Services   Discharge Planning Services: CM Consult Post Acute Care Choice: Home Health, Resumption of Svcs/PTA Provider            DME Agency: NA       HH Arranged: PT, OT HH Agency: [place] Date [place]: 09/18/22 Time [place]: 1242 Representative spoke with at [address]: [place] Determinants of Health ([place]) Interventions     Readmission Risk Interventions    09/18/2022   12:41 PM  Readmission Risk Prevention Plan  Transportation Screening Complete  Medication Review Press photographer) Complete  HRI or [place] Complete  SW Recovery Care/Counseling Consult Complete  [place] Not Applicable

## 2022-09-24 DIAGNOSIS — H9190 Unspecified hearing loss, unspecified ear: Secondary | ICD-10-CM | POA: Diagnosis not present

## 2022-09-24 DIAGNOSIS — I5032 Chronic diastolic (congestive) heart failure: Secondary | ICD-10-CM | POA: Diagnosis not present

## 2022-09-24 DIAGNOSIS — F03918 Unspecified dementia, unspecified severity, with other behavioral disturbance: Secondary | ICD-10-CM | POA: Diagnosis not present

## 2022-09-24 DIAGNOSIS — F0393 Unspecified dementia, unspecified severity, with mood disturbance: Secondary | ICD-10-CM | POA: Diagnosis not present

## 2022-09-24 DIAGNOSIS — J690 Pneumonitis due to inhalation of food and vomit: Secondary | ICD-10-CM | POA: Diagnosis not present

## 2022-09-24 DIAGNOSIS — F329 Major depressive disorder, single episode, unspecified: Secondary | ICD-10-CM | POA: Diagnosis not present

## 2022-09-24 DIAGNOSIS — J9601 Acute respiratory failure with hypoxia: Secondary | ICD-10-CM | POA: Diagnosis not present

## 2022-09-24 DIAGNOSIS — A0471 Enterocolitis due to Clostridium difficile, recurrent: Secondary | ICD-10-CM | POA: Diagnosis not present

## 2022-09-24 DIAGNOSIS — I739 Peripheral vascular disease, unspecified: Secondary | ICD-10-CM | POA: Diagnosis not present

## 2022-09-24 DIAGNOSIS — R1310 Dysphagia, unspecified: Secondary | ICD-10-CM | POA: Diagnosis not present

## 2022-09-24 DIAGNOSIS — I2699 Other pulmonary embolism without acute cor pulmonale: Secondary | ICD-10-CM | POA: Diagnosis not present

## 2022-09-24 DIAGNOSIS — R627 Adult failure to thrive: Secondary | ICD-10-CM | POA: Diagnosis not present

## 2022-09-24 DIAGNOSIS — I110 Hypertensive heart disease with heart failure: Secondary | ICD-10-CM | POA: Diagnosis not present

## 2022-09-30 DIAGNOSIS — A0472 Enterocolitis due to Clostridium difficile, not specified as recurrent: Secondary | ICD-10-CM | POA: Diagnosis not present

## 2022-09-30 DIAGNOSIS — I2609 Other pulmonary embolism with acute cor pulmonale: Secondary | ICD-10-CM | POA: Diagnosis not present

## 2022-09-30 DIAGNOSIS — J9601 Acute respiratory failure with hypoxia: Secondary | ICD-10-CM | POA: Diagnosis not present

## 2022-09-30 DIAGNOSIS — J690 Pneumonitis due to inhalation of food and vomit: Secondary | ICD-10-CM | POA: Diagnosis not present

## 2022-09-30 DIAGNOSIS — R7301 Impaired fasting glucose: Secondary | ICD-10-CM | POA: Diagnosis not present

## 2022-09-30 DIAGNOSIS — R1310 Dysphagia, unspecified: Secondary | ICD-10-CM | POA: Diagnosis not present

## 2022-09-30 DIAGNOSIS — R2681 Unsteadiness on feet: Secondary | ICD-10-CM | POA: Diagnosis not present

## 2022-10-01 DIAGNOSIS — F329 Major depressive disorder, single episode, unspecified: Secondary | ICD-10-CM | POA: Diagnosis not present

## 2022-10-01 DIAGNOSIS — F03918 Unspecified dementia, unspecified severity, with other behavioral disturbance: Secondary | ICD-10-CM | POA: Diagnosis not present

## 2022-10-01 DIAGNOSIS — I110 Hypertensive heart disease with heart failure: Secondary | ICD-10-CM | POA: Diagnosis not present

## 2022-10-01 DIAGNOSIS — Z8551 Personal history of malignant neoplasm of bladder: Secondary | ICD-10-CM | POA: Diagnosis not present

## 2022-10-01 DIAGNOSIS — Z86010 Personal history of colonic polyps: Secondary | ICD-10-CM | POA: Diagnosis not present

## 2022-10-01 DIAGNOSIS — E785 Hyperlipidemia, unspecified: Secondary | ICD-10-CM | POA: Diagnosis not present

## 2022-10-01 DIAGNOSIS — J690 Pneumonitis due to inhalation of food and vomit: Secondary | ICD-10-CM | POA: Diagnosis not present

## 2022-10-01 DIAGNOSIS — Z792 Long term (current) use of antibiotics: Secondary | ICD-10-CM | POA: Diagnosis not present

## 2022-10-01 DIAGNOSIS — R627 Adult failure to thrive: Secondary | ICD-10-CM | POA: Diagnosis not present

## 2022-10-01 DIAGNOSIS — N39498 Other specified urinary incontinence: Secondary | ICD-10-CM | POA: Diagnosis not present

## 2022-10-01 DIAGNOSIS — Z86711 Personal history of pulmonary embolism: Secondary | ICD-10-CM | POA: Diagnosis not present

## 2022-10-01 DIAGNOSIS — Z8673 Personal history of transient ischemic attack (TIA), and cerebral infarction without residual deficits: Secondary | ICD-10-CM | POA: Diagnosis not present

## 2022-10-01 DIAGNOSIS — H9190 Unspecified hearing loss, unspecified ear: Secondary | ICD-10-CM | POA: Diagnosis not present

## 2022-10-01 DIAGNOSIS — Z9181 History of falling: Secondary | ICD-10-CM | POA: Diagnosis not present

## 2022-10-01 DIAGNOSIS — R1310 Dysphagia, unspecified: Secondary | ICD-10-CM | POA: Diagnosis not present

## 2022-10-01 DIAGNOSIS — I739 Peripheral vascular disease, unspecified: Secondary | ICD-10-CM | POA: Diagnosis not present

## 2022-10-01 DIAGNOSIS — A0471 Enterocolitis due to Clostridium difficile, recurrent: Secondary | ICD-10-CM | POA: Diagnosis not present

## 2022-10-01 DIAGNOSIS — N401 Enlarged prostate with lower urinary tract symptoms: Secondary | ICD-10-CM | POA: Diagnosis not present

## 2022-10-01 DIAGNOSIS — J9601 Acute respiratory failure with hypoxia: Secondary | ICD-10-CM | POA: Diagnosis not present

## 2022-10-01 DIAGNOSIS — I5032 Chronic diastolic (congestive) heart failure: Secondary | ICD-10-CM | POA: Diagnosis not present

## 2022-10-01 DIAGNOSIS — F0393 Unspecified dementia, unspecified severity, with mood disturbance: Secondary | ICD-10-CM | POA: Diagnosis not present

## 2022-10-01 NOTE — Progress Notes (Signed)
Sepsis ruled in

## 2022-10-02 ENCOUNTER — Telehealth: Payer: Self-pay

## 2022-10-02 NOTE — Telephone Encounter (Signed)
Patient daughter Gerald Stabs calling to schedule appointment for her father to be seen for C-Diff.   Call back number is [phone]

## 2022-10-07 ENCOUNTER — Ambulatory Visit (INDEPENDENT_AMBULATORY_CARE_PROVIDER_SITE_OTHER): Payer: PPO | Admitting: Internal Medicine

## 2022-10-07 ENCOUNTER — Telehealth: Payer: Self-pay

## 2022-10-07 ENCOUNTER — Other Ambulatory Visit: Payer: Self-pay | Admitting: Pharmacist

## 2022-10-07 ENCOUNTER — Other Ambulatory Visit: Payer: Self-pay

## 2022-10-07 VITALS — BP 119/63 | HR 65 | Temp 97.6°F

## 2022-10-07 DIAGNOSIS — A0472 Enterocolitis due to Clostridium difficile, not specified as recurrent: Secondary | ICD-10-CM | POA: Diagnosis not present

## 2022-10-07 DIAGNOSIS — R1310 Dysphagia, unspecified: Secondary | ICD-10-CM | POA: Diagnosis not present

## 2022-10-07 DIAGNOSIS — J690 Pneumonitis due to inhalation of food and vomit: Secondary | ICD-10-CM | POA: Diagnosis not present

## 2022-10-07 NOTE — Progress Notes (Signed)
In error - Zinplava not available on therapy plan  Alfonse Spruce, PharmD, CPP, BCIDP, [place] Clinical Pharmacist Practitioner Infectious Diseases [place] for [facility] w

## 2022-10-07 NOTE — Telephone Encounter (Addendum)
Per Dr. Linus Salmons patient will need infusion for Zinplava x1. Reached out to [facility] for appointment in 4-5 weeks. Patient would prefer appointment after 2 if possible. Left voicemail requesting call back to schedule visit.  Patient's daughter [name]) would like call once appointment is scheduled. Okay to leave a voicemail if not able to reach her. P: [phone]. Leatrice Jewels, RMA

## 2022-10-08 ENCOUNTER — Encounter: Payer: Self-pay | Admitting: Internal Medicine

## 2022-10-08 DIAGNOSIS — I5032 Chronic diastolic (congestive) heart failure: Secondary | ICD-10-CM | POA: Diagnosis not present

## 2022-10-08 DIAGNOSIS — N401 Enlarged prostate with lower urinary tract symptoms: Secondary | ICD-10-CM | POA: Diagnosis not present

## 2022-10-08 DIAGNOSIS — F0393 Unspecified dementia, unspecified severity, with mood disturbance: Secondary | ICD-10-CM | POA: Diagnosis not present

## 2022-10-08 DIAGNOSIS — E785 Hyperlipidemia, unspecified: Secondary | ICD-10-CM | POA: Diagnosis not present

## 2022-10-08 DIAGNOSIS — Z86711 Personal history of pulmonary embolism: Secondary | ICD-10-CM | POA: Diagnosis not present

## 2022-10-08 DIAGNOSIS — F329 Major depressive disorder, single episode, unspecified: Secondary | ICD-10-CM | POA: Diagnosis not present

## 2022-10-08 DIAGNOSIS — Z86010 Personal history of colonic polyps: Secondary | ICD-10-CM | POA: Diagnosis not present

## 2022-10-08 DIAGNOSIS — Z9181 History of falling: Secondary | ICD-10-CM | POA: Diagnosis not present

## 2022-10-08 DIAGNOSIS — I739 Peripheral vascular disease, unspecified: Secondary | ICD-10-CM | POA: Diagnosis not present

## 2022-10-08 DIAGNOSIS — I110 Hypertensive heart disease with heart failure: Secondary | ICD-10-CM | POA: Diagnosis not present

## 2022-10-08 DIAGNOSIS — H9190 Unspecified hearing loss, unspecified ear: Secondary | ICD-10-CM | POA: Diagnosis not present

## 2022-10-08 DIAGNOSIS — R1310 Dysphagia, unspecified: Secondary | ICD-10-CM | POA: Diagnosis not present

## 2022-10-08 DIAGNOSIS — Z8673 Personal history of transient ischemic attack (TIA), and cerebral infarction without residual deficits: Secondary | ICD-10-CM | POA: Diagnosis not present

## 2022-10-08 DIAGNOSIS — J690 Pneumonitis due to inhalation of food and vomit: Secondary | ICD-10-CM | POA: Diagnosis not present

## 2022-10-08 DIAGNOSIS — J9601 Acute respiratory failure with hypoxia: Secondary | ICD-10-CM | POA: Diagnosis not present

## 2022-10-08 DIAGNOSIS — N39498 Other specified urinary incontinence: Secondary | ICD-10-CM | POA: Diagnosis not present

## 2022-10-08 DIAGNOSIS — F03918 Unspecified dementia, unspecified severity, with other behavioral disturbance: Secondary | ICD-10-CM | POA: Diagnosis not present

## 2022-10-08 DIAGNOSIS — R627 Adult failure to thrive: Secondary | ICD-10-CM | POA: Diagnosis not present

## 2022-10-08 DIAGNOSIS — A0471 Enterocolitis due to Clostridium difficile, recurrent: Secondary | ICD-10-CM | POA: Diagnosis not present

## 2022-10-08 DIAGNOSIS — Z8551 Personal history of malignant neoplasm of bladder: Secondary | ICD-10-CM | POA: Diagnosis not present

## 2022-10-08 DIAGNOSIS — Z792 Long term (current) use of antibiotics: Secondary | ICD-10-CM | POA: Diagnosis not present

## 2022-10-08 NOTE — Progress Notes (Signed)
   Subjective:    Patient ID: Paul [name], male    DOB: [birth year], 88 y.o.   MRN: [id number]  HPI He is here for hospital follow up of C diff infection He was hospitalized earlier this month with intermittent diarrhea and diagnosis in July with C diff infection. He was initially admitted to the hospital in June with fever, nausea and vomiting that occurred acutely associated with chills.  He has  history of dysphagia and cxr noted some atelectasis and he was treated for presumed pneumonia vs aspiration and treated with antibiotics.  He returned to the hospital in early July with a similar episode and also diarrhea and was C diff toxin and Ag positive.  In august he developed a pulmonary PE then hospitalized earlier this month again with vomiting and diarrhea and C diff positive, felt to be recurrent vs continued infection.  He is now on a long taper of vancomycin and has been approved for Zinplava.      Review of Systems  Constitutional:  Positive for fatigue. Negative for fever.  Gastrointestinal:  Negative for diarrhea and nausea.  Skin:  Negative for rash.       Objective:   Physical Exam Eyes:     General: No scleral icterus. Pulmonary:     Effort: Pulmonary effort is normal.  Neurological:     Mental Status: He is alert.  Psychiatric:        Mood and Affect: Mood normal.   SH: no tobacco        Assessment & Plan:

## 2022-10-08 NOTE — Assessment & Plan Note (Addendum)
He seems to have infection with C diff and now resolving.  A big part of the issue is continued antibiotic use.  I will have him continue with the long taper of oral vancomycin and set him up for the Zinplava infusion toward the end of the treatment course.  I discussed with the daughter and wife extensively the expectations that he will continue to have intermittent loose stools and bloating for months after an episode of C diff but certainly is at risk of relapse if he develops a signficant number of watery stools again.    I have personally spent 40 minutes involved in face-to-face and non-face-to-face activities for this patient on the day of the visit. Professional time spent includes the following activities: Preparing to see the patient (review of tests), Obtaining and/or reviewing separately obtained history (admission/discharge record), Performing a medically appropriate examination and/or evaluation , Ordering medications/tests/procedures, referring and communicating with other health care professionals, Documenting clinical information in the EMR, Independently interpreting results (not separately reported), Communicating results to the patient/family/caregiver, Counseling and educating the patient/family/caregiver and Care coordination (not separately reported).

## 2022-10-08 NOTE — Telephone Encounter (Signed)
Called patient's daughter and updated her on appointment for short stay. Provided address and phone number. Would like to ask Dr. Linus Salmons a few questions about zinplava. Will forward message to provider.  Leatrice Jewels, RMA

## 2022-10-08 NOTE — Assessment & Plan Note (Signed)
Also discussed with the family the continued risk of aspiration and pneumonitis.  This can cause pulmonary findings on CXR and fever but pneumonitis does not typically require antibiotic treatment.  If there is concern for true aspiration pneumonia in the future, metronidazole alone would be a good treatment option rather than very broad antibiotic coverage, if appropriate.

## 2022-10-08 NOTE — Telephone Encounter (Signed)
Patient's daughter called back, says 11/21 will not work for them as patient has a conflicting appointment. Called short stay to reschedule, no answer. Left voicemail requesting call back to reschedule the patient for an afternoon appointment.   Will need to update daughter once rescheduled.   Beryle Flock, RN

## 2022-10-08 NOTE — Telephone Encounter (Signed)
Per [facility] not able to schedule Zinplava infusion. Will send order to short stay. Leatrice Jewels, RMA

## 2022-10-08 NOTE — Assessment & Plan Note (Signed)
With continued dysphagia, he will continue to be at risk for aspiration.  I discussed this with the family and to watch what he eats.

## 2022-10-08 NOTE — Telephone Encounter (Signed)
Short stay called, patient is scheduled for infusion at [facility] 11/21 at 1 PM. Left voicemail with appointment time for [facility] and asked her to please call back with any questions.   Beryle Flock, RN

## 2022-10-08 NOTE — Telephone Encounter (Signed)
Left voicemail with short stay to schedule appointment for Zinplava infusion. Orders faxed today.  Will need to update patient's daughter once appointment is scheduled. Leatrice Jewels, RMA

## 2022-10-09 DIAGNOSIS — I5032 Chronic diastolic (congestive) heart failure: Secondary | ICD-10-CM | POA: Diagnosis not present

## 2022-10-09 DIAGNOSIS — Z9181 History of falling: Secondary | ICD-10-CM | POA: Diagnosis not present

## 2022-10-09 DIAGNOSIS — Z86711 Personal history of pulmonary embolism: Secondary | ICD-10-CM | POA: Diagnosis not present

## 2022-10-09 DIAGNOSIS — J9601 Acute respiratory failure with hypoxia: Secondary | ICD-10-CM | POA: Diagnosis not present

## 2022-10-09 DIAGNOSIS — Z8673 Personal history of transient ischemic attack (TIA), and cerebral infarction without residual deficits: Secondary | ICD-10-CM | POA: Diagnosis not present

## 2022-10-09 DIAGNOSIS — H9190 Unspecified hearing loss, unspecified ear: Secondary | ICD-10-CM | POA: Diagnosis not present

## 2022-10-09 DIAGNOSIS — F0393 Unspecified dementia, unspecified severity, with mood disturbance: Secondary | ICD-10-CM | POA: Diagnosis not present

## 2022-10-09 DIAGNOSIS — F03918 Unspecified dementia, unspecified severity, with other behavioral disturbance: Secondary | ICD-10-CM | POA: Diagnosis not present

## 2022-10-09 DIAGNOSIS — N401 Enlarged prostate with lower urinary tract symptoms: Secondary | ICD-10-CM | POA: Diagnosis not present

## 2022-10-09 DIAGNOSIS — E785 Hyperlipidemia, unspecified: Secondary | ICD-10-CM | POA: Diagnosis not present

## 2022-10-09 DIAGNOSIS — R627 Adult failure to thrive: Secondary | ICD-10-CM | POA: Diagnosis not present

## 2022-10-09 DIAGNOSIS — Z86010 Personal history of colonic polyps: Secondary | ICD-10-CM | POA: Diagnosis not present

## 2022-10-09 DIAGNOSIS — Z8551 Personal history of malignant neoplasm of bladder: Secondary | ICD-10-CM | POA: Diagnosis not present

## 2022-10-09 DIAGNOSIS — Z792 Long term (current) use of antibiotics: Secondary | ICD-10-CM | POA: Diagnosis not present

## 2022-10-09 DIAGNOSIS — A0471 Enterocolitis due to Clostridium difficile, recurrent: Secondary | ICD-10-CM | POA: Diagnosis not present

## 2022-10-09 DIAGNOSIS — N39498 Other specified urinary incontinence: Secondary | ICD-10-CM | POA: Diagnosis not present

## 2022-10-09 DIAGNOSIS — I110 Hypertensive heart disease with heart failure: Secondary | ICD-10-CM | POA: Diagnosis not present

## 2022-10-09 DIAGNOSIS — J690 Pneumonitis due to inhalation of food and vomit: Secondary | ICD-10-CM | POA: Diagnosis not present

## 2022-10-09 DIAGNOSIS — I739 Peripheral vascular disease, unspecified: Secondary | ICD-10-CM | POA: Diagnosis not present

## 2022-10-09 DIAGNOSIS — R1310 Dysphagia, unspecified: Secondary | ICD-10-CM | POA: Diagnosis not present

## 2022-10-09 DIAGNOSIS — F329 Major depressive disorder, single episode, unspecified: Secondary | ICD-10-CM | POA: Diagnosis not present

## 2022-10-09 NOTE — Telephone Encounter (Signed)
Advised of appt date, time and location for infusion on 11/05/22.  Also gave contact info for Alfonse Spruce for questions related to medication and treatment plan.

## 2022-10-09 NOTE — Telephone Encounter (Signed)
Return call from infusion center. New appointment on 11/22 at [place], and HIPPA compliant voicemail to return office call for updated appointment information.  Paul [name]

## 2022-10-10 DIAGNOSIS — E785 Hyperlipidemia, unspecified: Secondary | ICD-10-CM | POA: Diagnosis not present

## 2022-10-10 DIAGNOSIS — I639 Cerebral infarction, unspecified: Secondary | ICD-10-CM | POA: Diagnosis not present

## 2022-10-10 DIAGNOSIS — F0390 Unspecified dementia without behavioral disturbance: Secondary | ICD-10-CM | POA: Diagnosis not present

## 2022-10-10 DIAGNOSIS — L8990 Pressure ulcer of unspecified site, unspecified stage: Secondary | ICD-10-CM | POA: Diagnosis not present

## 2022-10-13 ENCOUNTER — Telehealth: Payer: Self-pay

## 2022-10-13 NOTE — Telephone Encounter (Signed)
Was informed patient's daughter called about vancomycin taper. Returned patient's daughter's call. Daughter was asking about timing of Zinplava infusion with respect to finishing vancomycin taper. Informed her that it is okay that patient will finish vancomycin taper prior to Zinplava infusion. Daughter says he will finish the taper on 11/15 which is appropriate. She is wanting to move the infusion appointment to an earlier date since the current appointment is right before Thanksgiving. Informed her she will have to reach out to scheduling at short stay to re-schedule the infusion.   Eliseo Gum, PharmD PGY1 Pharmacy Resident   10/13/2022  3:28 PM

## 2022-10-15 DIAGNOSIS — I110 Hypertensive heart disease with heart failure: Secondary | ICD-10-CM | POA: Diagnosis not present

## 2022-10-15 DIAGNOSIS — I5032 Chronic diastolic (congestive) heart failure: Secondary | ICD-10-CM | POA: Diagnosis not present

## 2022-10-15 DIAGNOSIS — A0471 Enterocolitis due to Clostridium difficile, recurrent: Secondary | ICD-10-CM | POA: Diagnosis not present

## 2022-10-15 DIAGNOSIS — Z86711 Personal history of pulmonary embolism: Secondary | ICD-10-CM | POA: Diagnosis not present

## 2022-10-15 DIAGNOSIS — N39498 Other specified urinary incontinence: Secondary | ICD-10-CM | POA: Diagnosis not present

## 2022-10-15 DIAGNOSIS — Z9181 History of falling: Secondary | ICD-10-CM | POA: Diagnosis not present

## 2022-10-15 DIAGNOSIS — Z86010 Personal history of colonic polyps: Secondary | ICD-10-CM | POA: Diagnosis not present

## 2022-10-15 DIAGNOSIS — Z8551 Personal history of malignant neoplasm of bladder: Secondary | ICD-10-CM | POA: Diagnosis not present

## 2022-10-15 DIAGNOSIS — N401 Enlarged prostate with lower urinary tract symptoms: Secondary | ICD-10-CM | POA: Diagnosis not present

## 2022-10-15 DIAGNOSIS — R627 Adult failure to thrive: Secondary | ICD-10-CM | POA: Diagnosis not present

## 2022-10-15 DIAGNOSIS — F0393 Unspecified dementia, unspecified severity, with mood disturbance: Secondary | ICD-10-CM | POA: Diagnosis not present

## 2022-10-15 DIAGNOSIS — Z8673 Personal history of transient ischemic attack (TIA), and cerebral infarction without residual deficits: Secondary | ICD-10-CM | POA: Diagnosis not present

## 2022-10-15 DIAGNOSIS — F329 Major depressive disorder, single episode, unspecified: Secondary | ICD-10-CM | POA: Diagnosis not present

## 2022-10-15 DIAGNOSIS — J690 Pneumonitis due to inhalation of food and vomit: Secondary | ICD-10-CM | POA: Diagnosis not present

## 2022-10-15 DIAGNOSIS — Z792 Long term (current) use of antibiotics: Secondary | ICD-10-CM | POA: Diagnosis not present

## 2022-10-15 DIAGNOSIS — J9601 Acute respiratory failure with hypoxia: Secondary | ICD-10-CM | POA: Diagnosis not present

## 2022-10-15 DIAGNOSIS — I739 Peripheral vascular disease, unspecified: Secondary | ICD-10-CM | POA: Diagnosis not present

## 2022-10-15 DIAGNOSIS — H9190 Unspecified hearing loss, unspecified ear: Secondary | ICD-10-CM | POA: Diagnosis not present

## 2022-10-15 DIAGNOSIS — F03918 Unspecified dementia, unspecified severity, with other behavioral disturbance: Secondary | ICD-10-CM | POA: Diagnosis not present

## 2022-10-15 DIAGNOSIS — R1310 Dysphagia, unspecified: Secondary | ICD-10-CM | POA: Diagnosis not present

## 2022-10-15 DIAGNOSIS — E785 Hyperlipidemia, unspecified: Secondary | ICD-10-CM | POA: Diagnosis not present

## 2022-10-21 DIAGNOSIS — I110 Hypertensive heart disease with heart failure: Secondary | ICD-10-CM | POA: Diagnosis not present

## 2022-10-21 DIAGNOSIS — J9601 Acute respiratory failure with hypoxia: Secondary | ICD-10-CM | POA: Diagnosis not present

## 2022-10-21 DIAGNOSIS — Z9181 History of falling: Secondary | ICD-10-CM | POA: Diagnosis not present

## 2022-10-21 DIAGNOSIS — Z8673 Personal history of transient ischemic attack (TIA), and cerebral infarction without residual deficits: Secondary | ICD-10-CM | POA: Diagnosis not present

## 2022-10-21 DIAGNOSIS — E785 Hyperlipidemia, unspecified: Secondary | ICD-10-CM | POA: Diagnosis not present

## 2022-10-21 DIAGNOSIS — N401 Enlarged prostate with lower urinary tract symptoms: Secondary | ICD-10-CM | POA: Diagnosis not present

## 2022-10-21 DIAGNOSIS — F329 Major depressive disorder, single episode, unspecified: Secondary | ICD-10-CM | POA: Diagnosis not present

## 2022-10-21 DIAGNOSIS — N39498 Other specified urinary incontinence: Secondary | ICD-10-CM | POA: Diagnosis not present

## 2022-10-21 DIAGNOSIS — I739 Peripheral vascular disease, unspecified: Secondary | ICD-10-CM | POA: Diagnosis not present

## 2022-10-21 DIAGNOSIS — R627 Adult failure to thrive: Secondary | ICD-10-CM | POA: Diagnosis not present

## 2022-10-21 DIAGNOSIS — Z86010 Personal history of colonic polyps: Secondary | ICD-10-CM | POA: Diagnosis not present

## 2022-10-21 DIAGNOSIS — R1310 Dysphagia, unspecified: Secondary | ICD-10-CM | POA: Diagnosis not present

## 2022-10-21 DIAGNOSIS — J690 Pneumonitis due to inhalation of food and vomit: Secondary | ICD-10-CM | POA: Diagnosis not present

## 2022-10-21 DIAGNOSIS — Z86711 Personal history of pulmonary embolism: Secondary | ICD-10-CM | POA: Diagnosis not present

## 2022-10-21 DIAGNOSIS — A0471 Enterocolitis due to Clostridium difficile, recurrent: Secondary | ICD-10-CM | POA: Diagnosis not present

## 2022-10-21 DIAGNOSIS — Z8551 Personal history of malignant neoplasm of bladder: Secondary | ICD-10-CM | POA: Diagnosis not present

## 2022-10-21 DIAGNOSIS — F03918 Unspecified dementia, unspecified severity, with other behavioral disturbance: Secondary | ICD-10-CM | POA: Diagnosis not present

## 2022-10-21 DIAGNOSIS — H9190 Unspecified hearing loss, unspecified ear: Secondary | ICD-10-CM | POA: Diagnosis not present

## 2022-10-21 DIAGNOSIS — F0393 Unspecified dementia, unspecified severity, with mood disturbance: Secondary | ICD-10-CM | POA: Diagnosis not present

## 2022-10-21 DIAGNOSIS — I5032 Chronic diastolic (congestive) heart failure: Secondary | ICD-10-CM | POA: Diagnosis not present

## 2022-10-21 DIAGNOSIS — Z792 Long term (current) use of antibiotics: Secondary | ICD-10-CM | POA: Diagnosis not present

## 2022-10-22 ENCOUNTER — Telehealth: Payer: Self-pay | Admitting: Cardiovascular Disease

## 2022-10-22 NOTE — Telephone Encounter (Signed)
Pt c/o medication issue:  1. Name of Medication: zinplava  2. How are you currently taking this medication (dosage and times per day)? Has not done infusion yet  3. Are you having a reaction (difficulty breathing--STAT)? no  4. What is your medication issue? Patient's daughter states the patient has heart failure and a side effect of the zinplava is heart failure. She would like to know if it is okay for him to proceed with getting the infusion.

## 2022-10-22 NOTE — Telephone Encounter (Signed)
Called patient daughter.  Advised of message below from MD.   Patient daughter verbalized understanding.

## 2022-10-22 NOTE — Telephone Encounter (Signed)
The patient has not had many issues with heart failure lately.  It is reasonable to proceed with Zinplava infusion if it is felt that this is his best option for C. difficile.

## 2022-10-28 DIAGNOSIS — F0393 Unspecified dementia, unspecified severity, with mood disturbance: Secondary | ICD-10-CM | POA: Diagnosis not present

## 2022-10-28 DIAGNOSIS — F03918 Unspecified dementia, unspecified severity, with other behavioral disturbance: Secondary | ICD-10-CM | POA: Diagnosis not present

## 2022-10-28 DIAGNOSIS — Z86711 Personal history of pulmonary embolism: Secondary | ICD-10-CM | POA: Diagnosis not present

## 2022-10-28 DIAGNOSIS — Z86010 Personal history of colonic polyps: Secondary | ICD-10-CM | POA: Diagnosis not present

## 2022-10-28 DIAGNOSIS — I5032 Chronic diastolic (congestive) heart failure: Secondary | ICD-10-CM | POA: Diagnosis not present

## 2022-10-28 DIAGNOSIS — R627 Adult failure to thrive: Secondary | ICD-10-CM | POA: Diagnosis not present

## 2022-10-28 DIAGNOSIS — H9190 Unspecified hearing loss, unspecified ear: Secondary | ICD-10-CM | POA: Diagnosis not present

## 2022-10-28 DIAGNOSIS — N39498 Other specified urinary incontinence: Secondary | ICD-10-CM | POA: Diagnosis not present

## 2022-10-28 DIAGNOSIS — A0471 Enterocolitis due to Clostridium difficile, recurrent: Secondary | ICD-10-CM | POA: Diagnosis not present

## 2022-10-28 DIAGNOSIS — Z792 Long term (current) use of antibiotics: Secondary | ICD-10-CM | POA: Diagnosis not present

## 2022-10-28 DIAGNOSIS — N401 Enlarged prostate with lower urinary tract symptoms: Secondary | ICD-10-CM | POA: Diagnosis not present

## 2022-10-28 DIAGNOSIS — Z8551 Personal history of malignant neoplasm of bladder: Secondary | ICD-10-CM | POA: Diagnosis not present

## 2022-10-28 DIAGNOSIS — I739 Peripheral vascular disease, unspecified: Secondary | ICD-10-CM | POA: Diagnosis not present

## 2022-10-28 DIAGNOSIS — E785 Hyperlipidemia, unspecified: Secondary | ICD-10-CM | POA: Diagnosis not present

## 2022-10-28 DIAGNOSIS — Z8673 Personal history of transient ischemic attack (TIA), and cerebral infarction without residual deficits: Secondary | ICD-10-CM | POA: Diagnosis not present

## 2022-10-28 DIAGNOSIS — R1310 Dysphagia, unspecified: Secondary | ICD-10-CM | POA: Diagnosis not present

## 2022-10-28 DIAGNOSIS — I110 Hypertensive heart disease with heart failure: Secondary | ICD-10-CM | POA: Diagnosis not present

## 2022-10-28 DIAGNOSIS — F329 Major depressive disorder, single episode, unspecified: Secondary | ICD-10-CM | POA: Diagnosis not present

## 2022-10-28 DIAGNOSIS — J690 Pneumonitis due to inhalation of food and vomit: Secondary | ICD-10-CM | POA: Diagnosis not present

## 2022-10-28 DIAGNOSIS — Z9181 History of falling: Secondary | ICD-10-CM | POA: Diagnosis not present

## 2022-10-28 DIAGNOSIS — J9601 Acute respiratory failure with hypoxia: Secondary | ICD-10-CM | POA: Diagnosis not present

## 2022-10-30 ENCOUNTER — Other Ambulatory Visit (HOSPITAL_COMMUNITY): Payer: Self-pay | Admitting: *Deleted

## 2022-10-30 ENCOUNTER — Other Ambulatory Visit (HOSPITAL_COMMUNITY): Payer: Self-pay

## 2022-10-31 ENCOUNTER — Encounter (HOSPITAL_COMMUNITY)
Admission: RE | Admit: 2022-10-31 | Discharge: 2022-10-31 | Disposition: A | Discharge status: Home / Self Care | Payer: PPO | Source: Ambulatory Visit | Attending: Internal Medicine | Admitting: Internal Medicine

## 2022-10-31 VITALS — BP 128/57 | HR 62 | Temp 98.2°F | Resp 17 | Wt 140.0 lb

## 2022-10-31 DIAGNOSIS — A0471 Enterocolitis due to Clostridium difficile, recurrent: Secondary | ICD-10-CM | POA: Diagnosis not present

## 2022-10-31 DIAGNOSIS — A498 Other bacterial infections of unspecified site: Secondary | ICD-10-CM

## 2022-10-31 MED ORDER — SODIUM CHLORIDE 0.9 % IV SOLN
10.0000 mg/kg | Freq: Once | INTRAVENOUS | Status: AC
  Administered 2022-10-31: 635 mg via INTRAVENOUS
  Filled 2022-10-31: qty 25.4

## 2022-11-04 ENCOUNTER — Encounter (HOSPITAL_COMMUNITY): Payer: PPO

## 2022-11-04 DIAGNOSIS — E440 Moderate protein-calorie malnutrition: Secondary | ICD-10-CM | POA: Diagnosis not present

## 2022-11-04 DIAGNOSIS — Z125 Encounter for screening for malignant neoplasm of prostate: Secondary | ICD-10-CM | POA: Diagnosis not present

## 2022-11-04 DIAGNOSIS — F0390 Unspecified dementia without behavioral disturbance: Secondary | ICD-10-CM | POA: Diagnosis not present

## 2022-11-04 DIAGNOSIS — I2609 Other pulmonary embolism with acute cor pulmonale: Secondary | ICD-10-CM | POA: Diagnosis not present

## 2022-11-04 DIAGNOSIS — Z0000 Encounter for general adult medical examination without abnormal findings: Secondary | ICD-10-CM | POA: Diagnosis not present

## 2022-11-04 DIAGNOSIS — I110 Hypertensive heart disease with heart failure: Secondary | ICD-10-CM | POA: Diagnosis not present

## 2022-11-04 DIAGNOSIS — E291 Testicular hypofunction: Secondary | ICD-10-CM | POA: Diagnosis not present

## 2022-11-04 DIAGNOSIS — C679 Malignant neoplasm of bladder, unspecified: Secondary | ICD-10-CM | POA: Diagnosis not present

## 2022-11-04 DIAGNOSIS — I10 Essential (primary) hypertension: Secondary | ICD-10-CM | POA: Diagnosis not present

## 2022-11-04 DIAGNOSIS — A0472 Enterocolitis due to Clostridium difficile, not specified as recurrent: Secondary | ICD-10-CM | POA: Diagnosis not present

## 2022-11-04 DIAGNOSIS — Z1331 Encounter for screening for depression: Secondary | ICD-10-CM | POA: Diagnosis not present

## 2022-11-04 DIAGNOSIS — R809 Proteinuria, unspecified: Secondary | ICD-10-CM | POA: Diagnosis not present

## 2022-11-04 DIAGNOSIS — Z1339 Encounter for screening examination for other mental health and behavioral disorders: Secondary | ICD-10-CM | POA: Diagnosis not present

## 2022-11-04 DIAGNOSIS — E785 Hyperlipidemia, unspecified: Secondary | ICD-10-CM | POA: Diagnosis not present

## 2022-11-04 DIAGNOSIS — I5032 Chronic diastolic (congestive) heart failure: Secondary | ICD-10-CM | POA: Diagnosis not present

## 2022-11-04 DIAGNOSIS — D692 Other nonthrombocytopenic purpura: Secondary | ICD-10-CM | POA: Diagnosis not present

## 2022-11-04 DIAGNOSIS — I6990 Unspecified sequelae of unspecified cerebrovascular disease: Secondary | ICD-10-CM | POA: Diagnosis not present

## 2022-11-04 DIAGNOSIS — I739 Peripheral vascular disease, unspecified: Secondary | ICD-10-CM | POA: Diagnosis not present

## 2022-11-04 DIAGNOSIS — E538 Deficiency of other specified B group vitamins: Secondary | ICD-10-CM | POA: Diagnosis not present

## 2022-11-05 ENCOUNTER — Encounter (HOSPITAL_COMMUNITY): Payer: PPO

## 2022-11-10 DIAGNOSIS — E785 Hyperlipidemia, unspecified: Secondary | ICD-10-CM | POA: Diagnosis not present

## 2022-11-10 DIAGNOSIS — L8990 Pressure ulcer of unspecified site, unspecified stage: Secondary | ICD-10-CM | POA: Diagnosis not present

## 2022-11-10 DIAGNOSIS — I639 Cerebral infarction, unspecified: Secondary | ICD-10-CM | POA: Diagnosis not present

## 2022-11-10 DIAGNOSIS — F0390 Unspecified dementia without behavioral disturbance: Secondary | ICD-10-CM | POA: Diagnosis not present

## 2022-11-13 DIAGNOSIS — J690 Pneumonitis due to inhalation of food and vomit: Secondary | ICD-10-CM | POA: Diagnosis not present

## 2022-11-13 DIAGNOSIS — Z792 Long term (current) use of antibiotics: Secondary | ICD-10-CM | POA: Diagnosis not present

## 2022-11-13 DIAGNOSIS — Z86711 Personal history of pulmonary embolism: Secondary | ICD-10-CM | POA: Diagnosis not present

## 2022-11-13 DIAGNOSIS — E785 Hyperlipidemia, unspecified: Secondary | ICD-10-CM | POA: Diagnosis not present

## 2022-11-13 DIAGNOSIS — Z9181 History of falling: Secondary | ICD-10-CM | POA: Diagnosis not present

## 2022-11-13 DIAGNOSIS — F0393 Unspecified dementia, unspecified severity, with mood disturbance: Secondary | ICD-10-CM | POA: Diagnosis not present

## 2022-11-13 DIAGNOSIS — A0471 Enterocolitis due to Clostridium difficile, recurrent: Secondary | ICD-10-CM | POA: Diagnosis not present

## 2022-11-13 DIAGNOSIS — R1310 Dysphagia, unspecified: Secondary | ICD-10-CM | POA: Diagnosis not present

## 2022-11-13 DIAGNOSIS — Z8551 Personal history of malignant neoplasm of bladder: Secondary | ICD-10-CM | POA: Diagnosis not present

## 2022-11-13 DIAGNOSIS — I739 Peripheral vascular disease, unspecified: Secondary | ICD-10-CM | POA: Diagnosis not present

## 2022-11-13 DIAGNOSIS — H9190 Unspecified hearing loss, unspecified ear: Secondary | ICD-10-CM | POA: Diagnosis not present

## 2022-11-13 DIAGNOSIS — I110 Hypertensive heart disease with heart failure: Secondary | ICD-10-CM | POA: Diagnosis not present

## 2022-11-13 DIAGNOSIS — F03918 Unspecified dementia, unspecified severity, with other behavioral disturbance: Secondary | ICD-10-CM | POA: Diagnosis not present

## 2022-11-13 DIAGNOSIS — I5032 Chronic diastolic (congestive) heart failure: Secondary | ICD-10-CM | POA: Diagnosis not present

## 2022-11-13 DIAGNOSIS — Z8673 Personal history of transient ischemic attack (TIA), and cerebral infarction without residual deficits: Secondary | ICD-10-CM | POA: Diagnosis not present

## 2022-11-13 DIAGNOSIS — J9601 Acute respiratory failure with hypoxia: Secondary | ICD-10-CM | POA: Diagnosis not present

## 2022-11-13 DIAGNOSIS — Z86010 Personal history of colonic polyps: Secondary | ICD-10-CM | POA: Diagnosis not present

## 2022-11-13 DIAGNOSIS — F329 Major depressive disorder, single episode, unspecified: Secondary | ICD-10-CM | POA: Diagnosis not present

## 2022-11-13 DIAGNOSIS — R627 Adult failure to thrive: Secondary | ICD-10-CM | POA: Diagnosis not present

## 2022-11-13 DIAGNOSIS — N39498 Other specified urinary incontinence: Secondary | ICD-10-CM | POA: Diagnosis not present

## 2022-11-13 DIAGNOSIS — N401 Enlarged prostate with lower urinary tract symptoms: Secondary | ICD-10-CM | POA: Diagnosis not present

## 2022-11-18 DIAGNOSIS — I110 Hypertensive heart disease with heart failure: Secondary | ICD-10-CM | POA: Diagnosis not present

## 2022-11-18 DIAGNOSIS — Z86010 Personal history of colonic polyps: Secondary | ICD-10-CM | POA: Diagnosis not present

## 2022-11-18 DIAGNOSIS — N39498 Other specified urinary incontinence: Secondary | ICD-10-CM | POA: Diagnosis not present

## 2022-11-18 DIAGNOSIS — Z9181 History of falling: Secondary | ICD-10-CM | POA: Diagnosis not present

## 2022-11-18 DIAGNOSIS — Z8551 Personal history of malignant neoplasm of bladder: Secondary | ICD-10-CM | POA: Diagnosis not present

## 2022-11-18 DIAGNOSIS — F03918 Unspecified dementia, unspecified severity, with other behavioral disturbance: Secondary | ICD-10-CM | POA: Diagnosis not present

## 2022-11-18 DIAGNOSIS — Z86711 Personal history of pulmonary embolism: Secondary | ICD-10-CM | POA: Diagnosis not present

## 2022-11-18 DIAGNOSIS — N401 Enlarged prostate with lower urinary tract symptoms: Secondary | ICD-10-CM | POA: Diagnosis not present

## 2022-11-18 DIAGNOSIS — J690 Pneumonitis due to inhalation of food and vomit: Secondary | ICD-10-CM | POA: Diagnosis not present

## 2022-11-18 DIAGNOSIS — R1310 Dysphagia, unspecified: Secondary | ICD-10-CM | POA: Diagnosis not present

## 2022-11-18 DIAGNOSIS — F329 Major depressive disorder, single episode, unspecified: Secondary | ICD-10-CM | POA: Diagnosis not present

## 2022-11-18 DIAGNOSIS — H9190 Unspecified hearing loss, unspecified ear: Secondary | ICD-10-CM | POA: Diagnosis not present

## 2022-11-18 DIAGNOSIS — J9601 Acute respiratory failure with hypoxia: Secondary | ICD-10-CM | POA: Diagnosis not present

## 2022-11-18 DIAGNOSIS — E785 Hyperlipidemia, unspecified: Secondary | ICD-10-CM | POA: Diagnosis not present

## 2022-11-18 DIAGNOSIS — F0393 Unspecified dementia, unspecified severity, with mood disturbance: Secondary | ICD-10-CM | POA: Diagnosis not present

## 2022-11-18 DIAGNOSIS — Z8673 Personal history of transient ischemic attack (TIA), and cerebral infarction without residual deficits: Secondary | ICD-10-CM | POA: Diagnosis not present

## 2022-11-18 DIAGNOSIS — I739 Peripheral vascular disease, unspecified: Secondary | ICD-10-CM | POA: Diagnosis not present

## 2022-11-18 DIAGNOSIS — A0471 Enterocolitis due to Clostridium difficile, recurrent: Secondary | ICD-10-CM | POA: Diagnosis not present

## 2022-11-18 DIAGNOSIS — I5032 Chronic diastolic (congestive) heart failure: Secondary | ICD-10-CM | POA: Diagnosis not present

## 2022-11-18 DIAGNOSIS — R627 Adult failure to thrive: Secondary | ICD-10-CM | POA: Diagnosis not present

## 2022-11-18 DIAGNOSIS — Z792 Long term (current) use of antibiotics: Secondary | ICD-10-CM | POA: Diagnosis not present

## 2022-11-21 DIAGNOSIS — I739 Peripheral vascular disease, unspecified: Secondary | ICD-10-CM | POA: Diagnosis not present

## 2022-11-21 DIAGNOSIS — H9190 Unspecified hearing loss, unspecified ear: Secondary | ICD-10-CM | POA: Diagnosis not present

## 2022-11-21 DIAGNOSIS — J9601 Acute respiratory failure with hypoxia: Secondary | ICD-10-CM | POA: Diagnosis not present

## 2022-11-21 DIAGNOSIS — J690 Pneumonitis due to inhalation of food and vomit: Secondary | ICD-10-CM | POA: Diagnosis not present

## 2022-11-21 DIAGNOSIS — Z792 Long term (current) use of antibiotics: Secondary | ICD-10-CM | POA: Diagnosis not present

## 2022-11-21 DIAGNOSIS — N39498 Other specified urinary incontinence: Secondary | ICD-10-CM | POA: Diagnosis not present

## 2022-11-21 DIAGNOSIS — Z8673 Personal history of transient ischemic attack (TIA), and cerebral infarction without residual deficits: Secondary | ICD-10-CM | POA: Diagnosis not present

## 2022-11-21 DIAGNOSIS — E785 Hyperlipidemia, unspecified: Secondary | ICD-10-CM | POA: Diagnosis not present

## 2022-11-21 DIAGNOSIS — R1310 Dysphagia, unspecified: Secondary | ICD-10-CM | POA: Diagnosis not present

## 2022-11-21 DIAGNOSIS — I5032 Chronic diastolic (congestive) heart failure: Secondary | ICD-10-CM | POA: Diagnosis not present

## 2022-11-21 DIAGNOSIS — A0471 Enterocolitis due to Clostridium difficile, recurrent: Secondary | ICD-10-CM | POA: Diagnosis not present

## 2022-11-21 DIAGNOSIS — Z86010 Personal history of colonic polyps: Secondary | ICD-10-CM | POA: Diagnosis not present

## 2022-11-21 DIAGNOSIS — N401 Enlarged prostate with lower urinary tract symptoms: Secondary | ICD-10-CM | POA: Diagnosis not present

## 2022-11-21 DIAGNOSIS — R627 Adult failure to thrive: Secondary | ICD-10-CM | POA: Diagnosis not present

## 2022-11-21 DIAGNOSIS — Z86711 Personal history of pulmonary embolism: Secondary | ICD-10-CM | POA: Diagnosis not present

## 2022-11-21 DIAGNOSIS — F0393 Unspecified dementia, unspecified severity, with mood disturbance: Secondary | ICD-10-CM | POA: Diagnosis not present

## 2022-11-21 DIAGNOSIS — Z8551 Personal history of malignant neoplasm of bladder: Secondary | ICD-10-CM | POA: Diagnosis not present

## 2022-11-21 DIAGNOSIS — Z9181 History of falling: Secondary | ICD-10-CM | POA: Diagnosis not present

## 2022-11-21 DIAGNOSIS — F329 Major depressive disorder, single episode, unspecified: Secondary | ICD-10-CM | POA: Diagnosis not present

## 2022-11-21 DIAGNOSIS — I110 Hypertensive heart disease with heart failure: Secondary | ICD-10-CM | POA: Diagnosis not present

## 2022-11-21 DIAGNOSIS — F03918 Unspecified dementia, unspecified severity, with other behavioral disturbance: Secondary | ICD-10-CM | POA: Diagnosis not present

## 2022-11-23 DIAGNOSIS — A0471 Enterocolitis due to Clostridium difficile, recurrent: Secondary | ICD-10-CM | POA: Diagnosis not present

## 2022-11-23 DIAGNOSIS — H9190 Unspecified hearing loss, unspecified ear: Secondary | ICD-10-CM | POA: Diagnosis not present

## 2022-11-23 DIAGNOSIS — F0393 Unspecified dementia, unspecified severity, with mood disturbance: Secondary | ICD-10-CM | POA: Diagnosis not present

## 2022-11-23 DIAGNOSIS — R627 Adult failure to thrive: Secondary | ICD-10-CM | POA: Diagnosis not present

## 2022-11-23 DIAGNOSIS — J690 Pneumonitis due to inhalation of food and vomit: Secondary | ICD-10-CM | POA: Diagnosis not present

## 2022-11-23 DIAGNOSIS — J9601 Acute respiratory failure with hypoxia: Secondary | ICD-10-CM | POA: Diagnosis not present

## 2022-11-23 DIAGNOSIS — I110 Hypertensive heart disease with heart failure: Secondary | ICD-10-CM | POA: Diagnosis not present

## 2022-11-23 DIAGNOSIS — F03918 Unspecified dementia, unspecified severity, with other behavioral disturbance: Secondary | ICD-10-CM | POA: Diagnosis not present

## 2022-11-23 DIAGNOSIS — R1310 Dysphagia, unspecified: Secondary | ICD-10-CM | POA: Diagnosis not present

## 2022-11-23 DIAGNOSIS — I739 Peripheral vascular disease, unspecified: Secondary | ICD-10-CM | POA: Diagnosis not present

## 2022-11-23 DIAGNOSIS — I5032 Chronic diastolic (congestive) heart failure: Secondary | ICD-10-CM | POA: Diagnosis not present

## 2022-11-23 DIAGNOSIS — F329 Major depressive disorder, single episode, unspecified: Secondary | ICD-10-CM | POA: Diagnosis not present

## 2022-11-25 DIAGNOSIS — Z86711 Personal history of pulmonary embolism: Secondary | ICD-10-CM | POA: Diagnosis not present

## 2022-11-25 DIAGNOSIS — Z792 Long term (current) use of antibiotics: Secondary | ICD-10-CM | POA: Diagnosis not present

## 2022-11-25 DIAGNOSIS — F329 Major depressive disorder, single episode, unspecified: Secondary | ICD-10-CM | POA: Diagnosis not present

## 2022-11-25 DIAGNOSIS — R627 Adult failure to thrive: Secondary | ICD-10-CM | POA: Diagnosis not present

## 2022-11-25 DIAGNOSIS — Z86010 Personal history of colonic polyps: Secondary | ICD-10-CM | POA: Diagnosis not present

## 2022-11-25 DIAGNOSIS — I5032 Chronic diastolic (congestive) heart failure: Secondary | ICD-10-CM | POA: Diagnosis not present

## 2022-11-25 DIAGNOSIS — F0393 Unspecified dementia, unspecified severity, with mood disturbance: Secondary | ICD-10-CM | POA: Diagnosis not present

## 2022-11-25 DIAGNOSIS — Z9181 History of falling: Secondary | ICD-10-CM | POA: Diagnosis not present

## 2022-11-25 DIAGNOSIS — J690 Pneumonitis due to inhalation of food and vomit: Secondary | ICD-10-CM | POA: Diagnosis not present

## 2022-11-25 DIAGNOSIS — E785 Hyperlipidemia, unspecified: Secondary | ICD-10-CM | POA: Diagnosis not present

## 2022-11-25 DIAGNOSIS — N39498 Other specified urinary incontinence: Secondary | ICD-10-CM | POA: Diagnosis not present

## 2022-11-25 DIAGNOSIS — H9190 Unspecified hearing loss, unspecified ear: Secondary | ICD-10-CM | POA: Diagnosis not present

## 2022-11-25 DIAGNOSIS — Z8551 Personal history of malignant neoplasm of bladder: Secondary | ICD-10-CM | POA: Diagnosis not present

## 2022-11-25 DIAGNOSIS — I739 Peripheral vascular disease, unspecified: Secondary | ICD-10-CM | POA: Diagnosis not present

## 2022-11-25 DIAGNOSIS — J9601 Acute respiratory failure with hypoxia: Secondary | ICD-10-CM | POA: Diagnosis not present

## 2022-11-25 DIAGNOSIS — Z8673 Personal history of transient ischemic attack (TIA), and cerebral infarction without residual deficits: Secondary | ICD-10-CM | POA: Diagnosis not present

## 2022-11-25 DIAGNOSIS — A0471 Enterocolitis due to Clostridium difficile, recurrent: Secondary | ICD-10-CM | POA: Diagnosis not present

## 2022-11-25 DIAGNOSIS — N401 Enlarged prostate with lower urinary tract symptoms: Secondary | ICD-10-CM | POA: Diagnosis not present

## 2022-11-25 DIAGNOSIS — F03918 Unspecified dementia, unspecified severity, with other behavioral disturbance: Secondary | ICD-10-CM | POA: Diagnosis not present

## 2022-11-25 DIAGNOSIS — R1310 Dysphagia, unspecified: Secondary | ICD-10-CM | POA: Diagnosis not present

## 2022-11-25 DIAGNOSIS — I110 Hypertensive heart disease with heart failure: Secondary | ICD-10-CM | POA: Diagnosis not present

## 2022-11-27 ENCOUNTER — Encounter: Payer: Self-pay | Admitting: Internal Medicine

## 2022-11-27 ENCOUNTER — Ambulatory Visit (INDEPENDENT_AMBULATORY_CARE_PROVIDER_SITE_OTHER): Payer: PPO | Admitting: Internal Medicine

## 2022-11-27 VITALS — BP 114/52 | HR 59 | Ht 68.0 in | Wt 140.0 lb

## 2022-11-27 DIAGNOSIS — R159 Full incontinence of feces: Secondary | ICD-10-CM | POA: Diagnosis not present

## 2022-11-27 DIAGNOSIS — A0472 Enterocolitis due to Clostridium difficile, not specified as recurrent: Secondary | ICD-10-CM | POA: Diagnosis not present

## 2022-11-27 MED ORDER — VANCOMYCIN HCL 125 MG PO CAPS: 125.0000 mg | ORAL_CAPSULE | Freq: Four times a day (QID) | ORAL | 2 refills | Status: DC

## 2022-11-27 NOTE — Patient Instructions (Signed)
_______________________________________________________  If you are age 86 or older, your body mass index should be between 23-30. Your Body mass index is 21.29 kg/m. If this is out of the aforementioned range listed, please consider follow up with your Primary Care Provider.  If you are age 24 or younger, your body mass index should be between 19-25. Your Body mass index is 21.29 kg/m. If this is out of the aformentioned range listed, please consider follow up with your Primary Care Provider.   ________________________________________________________  The  GI providers would like to encourage you to use [facility] to communicate with providers for non-urgent requests or questions.  Due to long hold times on the telephone, sending your provider a message by [facility] may be a faster and more efficient way to get a response.  Please allow 48 business hours for a response.  Please remember that this is for non-urgent requests.  _______________________________________________________  We have sent the following medications to your pharmacy for you to pick up at your convenience:  Vancomycin  Take 2 tablespoons of Citrucel daily  Take 1/2 of an Imodium weekly.

## 2022-11-27 NOTE — Progress Notes (Signed)
HISTORY OF PRESENT ILLNESS:  Paul [name] is a 88 y.o. male with past medical history as listed below who was instructed to follow-up with me in the GI clinic after a recent hospitalization for C. difficile related diarrhea and incontinence.  I last saw this patient in 2012.  He is accompanied today by his wife and son.  He was seen in consultation by my colleague Dr. Lorenso Courier, during his most recent hospitalization, on September 18, 2022.  Her assessment at that time is as follows: 88 year old male with history of CVA, prior bladder cancer, PAD, PE on Eliquis, recent C. difficile infection in 06/2022 presents with vomiting, diarrhea, and weakness. Found to be febrile to 101.7 upon arrival here. Denies abdominal pain. Patient has had emesis of brown liquid at home. He was first diagnosed with C. difficile in 7/23 and underwent a course of Dificid. After that, he developed recurrent diarrhea after completing his Dificid and thus was restarted on a oral vancomycin taper. He just completed this vancomycin taper last week. He was initially found to have a leukocytosis of 14.2 upon arrival, which has subsequently resolved. C. difficile studies show positive C. difficile antigen, negative C. difficile toxin, and positive C. difficile toxin PCR. Since patient does appear to have some fecal incontinence, we will proceed with treatment for C. difficile with p.o. vancomycin. Patient does appear to be tympanic on physical exam. Will perform a KUB to assess for ileus. If ileus is present, patient would need IV Flagyl in addition to p.o. vancomycin for treatment of C. difficile. ID is following.  He improved on medical therapy.  He was seen by ID.  He was discharged home.  He last saw ID October 08, 2022.  The impression and plan from Dr. Linus Salmons as follows: He seems to have infection with C diff and now resolving. A big part of the issue is continued antibiotic use. I will have him continue with the long taper of oral  vancomycin and set him up for the Zinplava infusion toward the end of the treatment course. I discussed with the daughter and wife extensively the expectations that he will continue to have intermittent loose stools and bloating for months after an episode of C diff but certainly is at risk of relapse if he develops a signficant number of watery stools again. Marland Kitchen  He did receive infusion of Zinplava on October 31, 2022.  He has continued on vancomycin milligrams 4 times daily.  Family tells me that he may have soft control bowel movements for 2 to 3 days at a time.  Then he will develop loose stools with incontinence.  He was clearly having issues with incontinence prior to his initial episode of C. difficile, dating back last year.  He does wear protective undergarments.  They have difficulty differentiating between his usual incontinence and the possibility of C. difficile.  He did try Imodium in the past, but this resulted in fecal impaction requiring disimpaction.  He is comfortable without abdominal pain or fevers.      REVIEW OF SYSTEMS:  All non-GI ROS negative except for hard of hearing  Past Medical History:  Diagnosis Date   Back pain    Bladder tumor    BPH (benign prostatic hyperplasia)    Clostridioides difficile infection 06/10/2022   Hyperlipidemia    Hypertension    Impaired hearing BILATERAL HEARING AIDS   Personal history of gastric ulcer    Pneumonia 06/10/2022    Past Surgical History:  Procedure  Laterality Date   APPENDECTOMY  1957   CATARACT EXTRACTION W/ INTRAOCULAR LENS  IMPLANT, BILATERAL     CYSTOSCOPY WITH BIOPSY  03/08/2012   Procedure: CYSTOSCOPY WITH BIOPSY;  Surgeon: Claybon Jabs, MD;  Location: [facility];  Service: Urology;  Laterality: N/A;  gyrus   TONSILLECTOMY  CHILD   TRANSURETHRAL INCISION OF PROSTATE  03/08/2012   Procedure: TRANSURETHRAL INCISION OF THE PROSTATE (TUIP);  Surgeon: Claybon Jabs, MD;  Location: [facility];  Service: Urology;  Laterality: N/A;   TRANSURETHRAL RESECTION OF BLADDER TUMOR  01/26/2012   Procedure: TRANSURETHRAL RESECTION OF BLADDER TUMOR (TURBT);  Surgeon: Claybon Jabs, MD;  Location: [facility];  Service: Urology;  Laterality: N/A;  GYRUS MYTOMICIN C     Social History MAGNUM LUNDE  reports that he quit smoking about 41 years ago. His smoking use included cigarettes. He has never used smokeless tobacco. He reports that he does not drink alcohol and does not use drugs.  family history includes Hypertension in his father; Kidney cancer in his mother.  Allergies  Allergen Reactions   Livalo [Pitavastatin] Other (See Comments)    Cramping and back pain   Haldol [Haloperidol] Other (See Comments)    Family does not like his affect when he is given this - makes him a "zombie."   Other Diarrhea and Other (See Comments)    Unnamed patch and tablets for dementia caused diarrhea (Possibly galantamine, from outside source)        PHYSICAL EXAMINATION: Vital signs: BP (!) 114/52   Pulse (!) 59   Ht '5\' 8"'$  (1.727 m)   Wt 140 lb (63.5 kg)   SpO2 95%   BMI 21.29 kg/m   Constitutional: Somewhat frail-appearing elderly gentleman in a wheelchair, no acute distress.  Looks good for 89 Psychiatric: alert and oriented x3, cooperative Eyes: extraocular movements intact, anicteric, conjunctiva pink Mouth: oral pharynx moist, no lesions Neck: supple no lymphadenopathy Cardiovascular: heart regular rate and rhythm, no murmur Lungs: clear to auscultation bilaterally Abdomen: soft, nontender, nondistended, no obvious ascites, no peritoneal signs, normal bowel sounds, no organomegaly Rectal: Omitted Extremities: no clubbing, cyanosis, or lower extremity edema bilaterally Skin: no lesions on visible extremities Neuro: No focal deficits.  Cranial nerves intact  ASSESSMENT:  1.  Recurrent C. difficile diarrhea. 2.  Chronic incontinence as described 3.   General medical problems   PLAN:  1.  Continue vancomycin 125 mg p.o. 4 times daily till follow-up..  The medication was reordered with refills.  Medication risks reviewed 2.  Initiate Citrucel 2 tablespoons daily.  Hopefully this will improve the consistency of his bowel movements. 3.  Try low-dose Imodium.  Just 1/2 tablet once weekly 4.  Continue to wear protective undergarments 5.  GI office follow-up in 1 month 6.  Ongoing general medical care with Dr. Joylene Draft A total time of 50 minutes was spent preparing to see the patient (regarding hospitalizations, consultant notes, consults outpatient notes, laboratories, x-rays, endoscopy reports and pathology reports), obtaining comprehensive history, performing medically appropriate physical examination, counseling and educating the patient and his family on the above listed issues, ordering medication, arranging follow-up, and documenting clinical information in the health record

## 2022-12-03 DIAGNOSIS — I5032 Chronic diastolic (congestive) heart failure: Secondary | ICD-10-CM | POA: Diagnosis not present

## 2022-12-03 DIAGNOSIS — A0471 Enterocolitis due to Clostridium difficile, recurrent: Secondary | ICD-10-CM | POA: Diagnosis not present

## 2022-12-03 DIAGNOSIS — Z9181 History of falling: Secondary | ICD-10-CM | POA: Diagnosis not present

## 2022-12-03 DIAGNOSIS — N39498 Other specified urinary incontinence: Secondary | ICD-10-CM | POA: Diagnosis not present

## 2022-12-03 DIAGNOSIS — H9190 Unspecified hearing loss, unspecified ear: Secondary | ICD-10-CM | POA: Diagnosis not present

## 2022-12-03 DIAGNOSIS — Z792 Long term (current) use of antibiotics: Secondary | ICD-10-CM | POA: Diagnosis not present

## 2022-12-03 DIAGNOSIS — F03918 Unspecified dementia, unspecified severity, with other behavioral disturbance: Secondary | ICD-10-CM | POA: Diagnosis not present

## 2022-12-03 DIAGNOSIS — R627 Adult failure to thrive: Secondary | ICD-10-CM | POA: Diagnosis not present

## 2022-12-03 DIAGNOSIS — Z8673 Personal history of transient ischemic attack (TIA), and cerebral infarction without residual deficits: Secondary | ICD-10-CM | POA: Diagnosis not present

## 2022-12-03 DIAGNOSIS — I739 Peripheral vascular disease, unspecified: Secondary | ICD-10-CM | POA: Diagnosis not present

## 2022-12-03 DIAGNOSIS — R1310 Dysphagia, unspecified: Secondary | ICD-10-CM | POA: Diagnosis not present

## 2022-12-03 DIAGNOSIS — Z86010 Personal history of colonic polyps: Secondary | ICD-10-CM | POA: Diagnosis not present

## 2022-12-03 DIAGNOSIS — Z8551 Personal history of malignant neoplasm of bladder: Secondary | ICD-10-CM | POA: Diagnosis not present

## 2022-12-03 DIAGNOSIS — E785 Hyperlipidemia, unspecified: Secondary | ICD-10-CM | POA: Diagnosis not present

## 2022-12-03 DIAGNOSIS — J690 Pneumonitis due to inhalation of food and vomit: Secondary | ICD-10-CM | POA: Diagnosis not present

## 2022-12-03 DIAGNOSIS — F0393 Unspecified dementia, unspecified severity, with mood disturbance: Secondary | ICD-10-CM | POA: Diagnosis not present

## 2022-12-03 DIAGNOSIS — J9601 Acute respiratory failure with hypoxia: Secondary | ICD-10-CM | POA: Diagnosis not present

## 2022-12-03 DIAGNOSIS — Z86711 Personal history of pulmonary embolism: Secondary | ICD-10-CM | POA: Diagnosis not present

## 2022-12-03 DIAGNOSIS — F329 Major depressive disorder, single episode, unspecified: Secondary | ICD-10-CM | POA: Diagnosis not present

## 2022-12-03 DIAGNOSIS — N401 Enlarged prostate with lower urinary tract symptoms: Secondary | ICD-10-CM | POA: Diagnosis not present

## 2022-12-03 DIAGNOSIS — I110 Hypertensive heart disease with heart failure: Secondary | ICD-10-CM | POA: Diagnosis not present

## 2022-12-10 DIAGNOSIS — F0393 Unspecified dementia, unspecified severity, with mood disturbance: Secondary | ICD-10-CM | POA: Diagnosis not present

## 2022-12-10 DIAGNOSIS — E785 Hyperlipidemia, unspecified: Secondary | ICD-10-CM | POA: Diagnosis not present

## 2022-12-10 DIAGNOSIS — H9190 Unspecified hearing loss, unspecified ear: Secondary | ICD-10-CM | POA: Diagnosis not present

## 2022-12-10 DIAGNOSIS — F0390 Unspecified dementia without behavioral disturbance: Secondary | ICD-10-CM | POA: Diagnosis not present

## 2022-12-10 DIAGNOSIS — A0471 Enterocolitis due to Clostridium difficile, recurrent: Secondary | ICD-10-CM | POA: Diagnosis not present

## 2022-12-10 DIAGNOSIS — F03918 Unspecified dementia, unspecified severity, with other behavioral disturbance: Secondary | ICD-10-CM | POA: Diagnosis not present

## 2022-12-10 DIAGNOSIS — R1310 Dysphagia, unspecified: Secondary | ICD-10-CM | POA: Diagnosis not present

## 2022-12-10 DIAGNOSIS — N401 Enlarged prostate with lower urinary tract symptoms: Secondary | ICD-10-CM | POA: Diagnosis not present

## 2022-12-10 DIAGNOSIS — Z8551 Personal history of malignant neoplasm of bladder: Secondary | ICD-10-CM | POA: Diagnosis not present

## 2022-12-10 DIAGNOSIS — N39498 Other specified urinary incontinence: Secondary | ICD-10-CM | POA: Diagnosis not present

## 2022-12-10 DIAGNOSIS — Z86711 Personal history of pulmonary embolism: Secondary | ICD-10-CM | POA: Diagnosis not present

## 2022-12-10 DIAGNOSIS — Z8673 Personal history of transient ischemic attack (TIA), and cerebral infarction without residual deficits: Secondary | ICD-10-CM | POA: Diagnosis not present

## 2022-12-10 DIAGNOSIS — L8990 Pressure ulcer of unspecified site, unspecified stage: Secondary | ICD-10-CM | POA: Diagnosis not present

## 2022-12-10 DIAGNOSIS — I5032 Chronic diastolic (congestive) heart failure: Secondary | ICD-10-CM | POA: Diagnosis not present

## 2022-12-10 DIAGNOSIS — J690 Pneumonitis due to inhalation of food and vomit: Secondary | ICD-10-CM | POA: Diagnosis not present

## 2022-12-10 DIAGNOSIS — F329 Major depressive disorder, single episode, unspecified: Secondary | ICD-10-CM | POA: Diagnosis not present

## 2022-12-10 DIAGNOSIS — Z9181 History of falling: Secondary | ICD-10-CM | POA: Diagnosis not present

## 2022-12-10 DIAGNOSIS — J9601 Acute respiratory failure with hypoxia: Secondary | ICD-10-CM | POA: Diagnosis not present

## 2022-12-10 DIAGNOSIS — I110 Hypertensive heart disease with heart failure: Secondary | ICD-10-CM | POA: Diagnosis not present

## 2022-12-10 DIAGNOSIS — Z792 Long term (current) use of antibiotics: Secondary | ICD-10-CM | POA: Diagnosis not present

## 2022-12-10 DIAGNOSIS — I639 Cerebral infarction, unspecified: Secondary | ICD-10-CM | POA: Diagnosis not present

## 2022-12-10 DIAGNOSIS — R627 Adult failure to thrive: Secondary | ICD-10-CM | POA: Diagnosis not present

## 2022-12-10 DIAGNOSIS — I739 Peripheral vascular disease, unspecified: Secondary | ICD-10-CM | POA: Diagnosis not present

## 2022-12-10 DIAGNOSIS — Z86010 Personal history of colonic polyps: Secondary | ICD-10-CM | POA: Diagnosis not present

## 2022-12-17 ENCOUNTER — Ambulatory Visit: Payer: PPO | Admitting: Internal Medicine

## 2022-12-17 ENCOUNTER — Telehealth: Payer: Self-pay | Admitting: Internal Medicine

## 2022-12-17 NOTE — Telephone Encounter (Signed)
Pts daughter states they are having a hard time getting pt to take the medication. Reports he wakes up around 3-4 pm and they give him a dose, then he gets a dose at bedtime, a dose at 3am and last dose at 9am. The doses they are waking him up for they are afraid he is going to choke as he is not sitting up good. Family wants to know if they can perhaps go to 2 or 3 times/day with the vancomycin. She also wanted to know if this was indefinite. Please advise.

## 2022-12-17 NOTE — Telephone Encounter (Signed)
PT daughter is calling about Vancomycin. They are having a tough time getting him to take the meds 4x daily. Wants to know can they do on twice a day and will it affect him. Also wants to know will he be on the medication for life or only until February when he's seen. Please advise.

## 2022-12-17 NOTE — Telephone Encounter (Signed)
Spoke with pts daughter and she is aware. States they will do the best they can.

## 2022-12-17 NOTE — Telephone Encounter (Signed)
Take 3 times a day.  Take all 3 doses, spread out, during the day.  No need to wake him from sleep

## 2022-12-26 DIAGNOSIS — J9601 Acute respiratory failure with hypoxia: Secondary | ICD-10-CM | POA: Diagnosis not present

## 2022-12-26 DIAGNOSIS — H9190 Unspecified hearing loss, unspecified ear: Secondary | ICD-10-CM | POA: Diagnosis not present

## 2022-12-26 DIAGNOSIS — F03918 Unspecified dementia, unspecified severity, with other behavioral disturbance: Secondary | ICD-10-CM | POA: Diagnosis not present

## 2022-12-26 DIAGNOSIS — Z8551 Personal history of malignant neoplasm of bladder: Secondary | ICD-10-CM | POA: Diagnosis not present

## 2022-12-26 DIAGNOSIS — J690 Pneumonitis due to inhalation of food and vomit: Secondary | ICD-10-CM | POA: Diagnosis not present

## 2022-12-26 DIAGNOSIS — Z792 Long term (current) use of antibiotics: Secondary | ICD-10-CM | POA: Diagnosis not present

## 2022-12-26 DIAGNOSIS — Z9181 History of falling: Secondary | ICD-10-CM | POA: Diagnosis not present

## 2022-12-26 DIAGNOSIS — A0471 Enterocolitis due to Clostridium difficile, recurrent: Secondary | ICD-10-CM | POA: Diagnosis not present

## 2022-12-26 DIAGNOSIS — E785 Hyperlipidemia, unspecified: Secondary | ICD-10-CM | POA: Diagnosis not present

## 2022-12-26 DIAGNOSIS — Z86711 Personal history of pulmonary embolism: Secondary | ICD-10-CM | POA: Diagnosis not present

## 2022-12-26 DIAGNOSIS — N401 Enlarged prostate with lower urinary tract symptoms: Secondary | ICD-10-CM | POA: Diagnosis not present

## 2022-12-26 DIAGNOSIS — I739 Peripheral vascular disease, unspecified: Secondary | ICD-10-CM | POA: Diagnosis not present

## 2022-12-26 DIAGNOSIS — Z86010 Personal history of colonic polyps: Secondary | ICD-10-CM | POA: Diagnosis not present

## 2022-12-26 DIAGNOSIS — F0393 Unspecified dementia, unspecified severity, with mood disturbance: Secondary | ICD-10-CM | POA: Diagnosis not present

## 2022-12-26 DIAGNOSIS — I5032 Chronic diastolic (congestive) heart failure: Secondary | ICD-10-CM | POA: Diagnosis not present

## 2022-12-26 DIAGNOSIS — R627 Adult failure to thrive: Secondary | ICD-10-CM | POA: Diagnosis not present

## 2022-12-26 DIAGNOSIS — N39498 Other specified urinary incontinence: Secondary | ICD-10-CM | POA: Diagnosis not present

## 2022-12-26 DIAGNOSIS — R1310 Dysphagia, unspecified: Secondary | ICD-10-CM | POA: Diagnosis not present

## 2022-12-26 DIAGNOSIS — Z8673 Personal history of transient ischemic attack (TIA), and cerebral infarction without residual deficits: Secondary | ICD-10-CM | POA: Diagnosis not present

## 2022-12-26 DIAGNOSIS — I110 Hypertensive heart disease with heart failure: Secondary | ICD-10-CM | POA: Diagnosis not present

## 2022-12-26 DIAGNOSIS — F329 Major depressive disorder, single episode, unspecified: Secondary | ICD-10-CM | POA: Diagnosis not present

## 2023-01-06 DIAGNOSIS — N39498 Other specified urinary incontinence: Secondary | ICD-10-CM | POA: Diagnosis not present

## 2023-01-06 DIAGNOSIS — R1310 Dysphagia, unspecified: Secondary | ICD-10-CM | POA: Diagnosis not present

## 2023-01-06 DIAGNOSIS — Z8551 Personal history of malignant neoplasm of bladder: Secondary | ICD-10-CM | POA: Diagnosis not present

## 2023-01-06 DIAGNOSIS — I739 Peripheral vascular disease, unspecified: Secondary | ICD-10-CM | POA: Diagnosis not present

## 2023-01-06 DIAGNOSIS — F329 Major depressive disorder, single episode, unspecified: Secondary | ICD-10-CM | POA: Diagnosis not present

## 2023-01-06 DIAGNOSIS — E785 Hyperlipidemia, unspecified: Secondary | ICD-10-CM | POA: Diagnosis not present

## 2023-01-06 DIAGNOSIS — Z9181 History of falling: Secondary | ICD-10-CM | POA: Diagnosis not present

## 2023-01-06 DIAGNOSIS — F0393 Unspecified dementia, unspecified severity, with mood disturbance: Secondary | ICD-10-CM | POA: Diagnosis not present

## 2023-01-06 DIAGNOSIS — Z86010 Personal history of colonic polyps: Secondary | ICD-10-CM | POA: Diagnosis not present

## 2023-01-06 DIAGNOSIS — Z8673 Personal history of transient ischemic attack (TIA), and cerebral infarction without residual deficits: Secondary | ICD-10-CM | POA: Diagnosis not present

## 2023-01-06 DIAGNOSIS — I110 Hypertensive heart disease with heart failure: Secondary | ICD-10-CM | POA: Diagnosis not present

## 2023-01-06 DIAGNOSIS — I5032 Chronic diastolic (congestive) heart failure: Secondary | ICD-10-CM | POA: Diagnosis not present

## 2023-01-06 DIAGNOSIS — R627 Adult failure to thrive: Secondary | ICD-10-CM | POA: Diagnosis not present

## 2023-01-06 DIAGNOSIS — A0471 Enterocolitis due to Clostridium difficile, recurrent: Secondary | ICD-10-CM | POA: Diagnosis not present

## 2023-01-06 DIAGNOSIS — F03918 Unspecified dementia, unspecified severity, with other behavioral disturbance: Secondary | ICD-10-CM | POA: Diagnosis not present

## 2023-01-06 DIAGNOSIS — J690 Pneumonitis due to inhalation of food and vomit: Secondary | ICD-10-CM | POA: Diagnosis not present

## 2023-01-06 DIAGNOSIS — J9601 Acute respiratory failure with hypoxia: Secondary | ICD-10-CM | POA: Diagnosis not present

## 2023-01-06 DIAGNOSIS — H9190 Unspecified hearing loss, unspecified ear: Secondary | ICD-10-CM | POA: Diagnosis not present

## 2023-01-06 DIAGNOSIS — N401 Enlarged prostate with lower urinary tract symptoms: Secondary | ICD-10-CM | POA: Diagnosis not present

## 2023-01-06 DIAGNOSIS — Z86711 Personal history of pulmonary embolism: Secondary | ICD-10-CM | POA: Diagnosis not present

## 2023-01-06 DIAGNOSIS — Z792 Long term (current) use of antibiotics: Secondary | ICD-10-CM | POA: Diagnosis not present

## 2023-01-10 DIAGNOSIS — I639 Cerebral infarction, unspecified: Secondary | ICD-10-CM | POA: Diagnosis not present

## 2023-01-10 DIAGNOSIS — L8990 Pressure ulcer of unspecified site, unspecified stage: Secondary | ICD-10-CM | POA: Diagnosis not present

## 2023-01-10 DIAGNOSIS — E785 Hyperlipidemia, unspecified: Secondary | ICD-10-CM | POA: Diagnosis not present

## 2023-01-10 DIAGNOSIS — F0390 Unspecified dementia without behavioral disturbance: Secondary | ICD-10-CM | POA: Diagnosis not present

## 2023-01-13 DIAGNOSIS — E785 Hyperlipidemia, unspecified: Secondary | ICD-10-CM | POA: Diagnosis not present

## 2023-01-13 DIAGNOSIS — H9190 Unspecified hearing loss, unspecified ear: Secondary | ICD-10-CM | POA: Diagnosis not present

## 2023-01-13 DIAGNOSIS — N401 Enlarged prostate with lower urinary tract symptoms: Secondary | ICD-10-CM | POA: Diagnosis not present

## 2023-01-13 DIAGNOSIS — N39498 Other specified urinary incontinence: Secondary | ICD-10-CM | POA: Diagnosis not present

## 2023-01-13 DIAGNOSIS — F329 Major depressive disorder, single episode, unspecified: Secondary | ICD-10-CM | POA: Diagnosis not present

## 2023-01-13 DIAGNOSIS — R1310 Dysphagia, unspecified: Secondary | ICD-10-CM | POA: Diagnosis not present

## 2023-01-13 DIAGNOSIS — F03918 Unspecified dementia, unspecified severity, with other behavioral disturbance: Secondary | ICD-10-CM | POA: Diagnosis not present

## 2023-01-13 DIAGNOSIS — A0471 Enterocolitis due to Clostridium difficile, recurrent: Secondary | ICD-10-CM | POA: Diagnosis not present

## 2023-01-13 DIAGNOSIS — I110 Hypertensive heart disease with heart failure: Secondary | ICD-10-CM | POA: Diagnosis not present

## 2023-01-13 DIAGNOSIS — J690 Pneumonitis due to inhalation of food and vomit: Secondary | ICD-10-CM | POA: Diagnosis not present

## 2023-01-13 DIAGNOSIS — Z9181 History of falling: Secondary | ICD-10-CM | POA: Diagnosis not present

## 2023-01-13 DIAGNOSIS — F0393 Unspecified dementia, unspecified severity, with mood disturbance: Secondary | ICD-10-CM | POA: Diagnosis not present

## 2023-01-13 DIAGNOSIS — J9601 Acute respiratory failure with hypoxia: Secondary | ICD-10-CM | POA: Diagnosis not present

## 2023-01-13 DIAGNOSIS — Z792 Long term (current) use of antibiotics: Secondary | ICD-10-CM | POA: Diagnosis not present

## 2023-01-13 DIAGNOSIS — R627 Adult failure to thrive: Secondary | ICD-10-CM | POA: Diagnosis not present

## 2023-01-13 DIAGNOSIS — I739 Peripheral vascular disease, unspecified: Secondary | ICD-10-CM | POA: Diagnosis not present

## 2023-01-13 DIAGNOSIS — Z8673 Personal history of transient ischemic attack (TIA), and cerebral infarction without residual deficits: Secondary | ICD-10-CM | POA: Diagnosis not present

## 2023-01-13 DIAGNOSIS — Z86711 Personal history of pulmonary embolism: Secondary | ICD-10-CM | POA: Diagnosis not present

## 2023-01-13 DIAGNOSIS — Z86010 Personal history of colonic polyps: Secondary | ICD-10-CM | POA: Diagnosis not present

## 2023-01-13 DIAGNOSIS — Z8551 Personal history of malignant neoplasm of bladder: Secondary | ICD-10-CM | POA: Diagnosis not present

## 2023-01-13 DIAGNOSIS — I5032 Chronic diastolic (congestive) heart failure: Secondary | ICD-10-CM | POA: Diagnosis not present

## 2023-01-19 DIAGNOSIS — N401 Enlarged prostate with lower urinary tract symptoms: Secondary | ICD-10-CM | POA: Diagnosis not present

## 2023-01-19 DIAGNOSIS — H9190 Unspecified hearing loss, unspecified ear: Secondary | ICD-10-CM | POA: Diagnosis not present

## 2023-01-19 DIAGNOSIS — F329 Major depressive disorder, single episode, unspecified: Secondary | ICD-10-CM | POA: Diagnosis not present

## 2023-01-19 DIAGNOSIS — J9601 Acute respiratory failure with hypoxia: Secondary | ICD-10-CM | POA: Diagnosis not present

## 2023-01-19 DIAGNOSIS — Z8673 Personal history of transient ischemic attack (TIA), and cerebral infarction without residual deficits: Secondary | ICD-10-CM | POA: Diagnosis not present

## 2023-01-19 DIAGNOSIS — I110 Hypertensive heart disease with heart failure: Secondary | ICD-10-CM | POA: Diagnosis not present

## 2023-01-19 DIAGNOSIS — R1310 Dysphagia, unspecified: Secondary | ICD-10-CM | POA: Diagnosis not present

## 2023-01-19 DIAGNOSIS — Z9181 History of falling: Secondary | ICD-10-CM | POA: Diagnosis not present

## 2023-01-19 DIAGNOSIS — F03918 Unspecified dementia, unspecified severity, with other behavioral disturbance: Secondary | ICD-10-CM | POA: Diagnosis not present

## 2023-01-19 DIAGNOSIS — E785 Hyperlipidemia, unspecified: Secondary | ICD-10-CM | POA: Diagnosis not present

## 2023-01-19 DIAGNOSIS — F0393 Unspecified dementia, unspecified severity, with mood disturbance: Secondary | ICD-10-CM | POA: Diagnosis not present

## 2023-01-19 DIAGNOSIS — I5032 Chronic diastolic (congestive) heart failure: Secondary | ICD-10-CM | POA: Diagnosis not present

## 2023-01-19 DIAGNOSIS — Z792 Long term (current) use of antibiotics: Secondary | ICD-10-CM | POA: Diagnosis not present

## 2023-01-19 DIAGNOSIS — Z8551 Personal history of malignant neoplasm of bladder: Secondary | ICD-10-CM | POA: Diagnosis not present

## 2023-01-19 DIAGNOSIS — N39498 Other specified urinary incontinence: Secondary | ICD-10-CM | POA: Diagnosis not present

## 2023-01-19 DIAGNOSIS — Z86010 Personal history of colonic polyps: Secondary | ICD-10-CM | POA: Diagnosis not present

## 2023-01-19 DIAGNOSIS — J690 Pneumonitis due to inhalation of food and vomit: Secondary | ICD-10-CM | POA: Diagnosis not present

## 2023-01-19 DIAGNOSIS — R627 Adult failure to thrive: Secondary | ICD-10-CM | POA: Diagnosis not present

## 2023-01-19 DIAGNOSIS — A0471 Enterocolitis due to Clostridium difficile, recurrent: Secondary | ICD-10-CM | POA: Diagnosis not present

## 2023-01-19 DIAGNOSIS — I739 Peripheral vascular disease, unspecified: Secondary | ICD-10-CM | POA: Diagnosis not present

## 2023-01-19 DIAGNOSIS — Z86711 Personal history of pulmonary embolism: Secondary | ICD-10-CM | POA: Diagnosis not present

## 2023-01-20 ENCOUNTER — Ambulatory Visit (INDEPENDENT_AMBULATORY_CARE_PROVIDER_SITE_OTHER): Payer: PPO | Admitting: Internal Medicine

## 2023-01-20 ENCOUNTER — Encounter: Payer: Self-pay | Admitting: Internal Medicine

## 2023-01-20 VITALS — BP 116/62 | HR 58 | Wt 150.0 lb

## 2023-01-20 DIAGNOSIS — R197 Diarrhea, unspecified: Secondary | ICD-10-CM | POA: Diagnosis not present

## 2023-01-20 DIAGNOSIS — A0472 Enterocolitis due to Clostridium difficile, not specified as recurrent: Secondary | ICD-10-CM | POA: Diagnosis not present

## 2023-01-20 DIAGNOSIS — R159 Full incontinence of feces: Secondary | ICD-10-CM

## 2023-01-20 MED ORDER — VANCOMYCIN HCL 125 MG PO CAPS: ORAL_CAPSULE | ORAL | 0 refills | Status: DC

## 2023-01-20 NOTE — Progress Notes (Signed)
HISTORY OF PRESENT ILLNESS:  Paul [name] is a 89 y.o. male with multiple significant medical problems who presents today for follow-up regarding recurrent C. difficile with diarrhea, chronic incontinence.  Last seen November 27, 2022.  See that dictation for details.  At that time he was continued on vancomycin 125 mg 4 times daily, told to initiate Citrucel 2 tablespoons daily, and try low-dose Imodium, just 1/2 tablet once weekly.  He was also instructed to continue to wear undergarments.  Follow-up at this time recommended.  Patient is accompanied today by his wife and daughter.  He could not tolerate Citrucel (difficult to swallow).  Did not try Imodium.  His problems with diarrhea have improved.  They currently report that he is typically having 1 or 2 formed bowel movements per day.  Approximately once per week he may have loose stool with incontinence.  He has no warning.  His problems with incontinence preceded issues with C. difficile.  He continues on [employer].  They wonder if they can stop.  1 point they did decrease his vancomycin to 3 times daily, but thought his diarrhea was worse.  No new problems.  Multiple questions.  REVIEW OF SYSTEMS:  All non-GI ROS negative unless otherwise stated in the HPI   Past Medical History:  Diagnosis Date   Back pain    Bladder tumor    BPH (benign prostatic hyperplasia)    Clostridioides difficile infection 06/10/2022   Hyperlipidemia    Hypertension    Impaired hearing BILATERAL HEARING AIDS   Personal history of gastric ulcer    Pneumonia 06/10/2022    Past Surgical History:  Procedure Laterality Date   APPENDECTOMY  1957   CATARACT EXTRACTION W/ INTRAOCULAR LENS  IMPLANT, BILATERAL     CYSTOSCOPY WITH BIOPSY  03/08/2012   Procedure: CYSTOSCOPY WITH BIOPSY;  Surgeon: Claybon Jabs, MD;  Location: [facility];  Service: Urology;  Laterality: N/A;  gyrus   TONSILLECTOMY  CHILD   TRANSURETHRAL INCISION OF PROSTATE   03/08/2012   Procedure: TRANSURETHRAL INCISION OF THE PROSTATE (TUIP);  Surgeon: Claybon Jabs, MD;  Location: [facility];  Service: Urology;  Laterality: N/A;   TRANSURETHRAL RESECTION OF BLADDER TUMOR  01/26/2012   Procedure: TRANSURETHRAL RESECTION OF BLADDER TUMOR (TURBT);  Surgeon: Claybon Jabs, MD;  Location[facility];  Service: Urology;  Laterality: N/A;  GYRUS MYTOMICIN C     Social History EMIR NACK  reports that he quit smoking about 42 years ago. His smoking use included cigarettes. He has never used smokeless tobacco. He reports that he does not drink alcohol and does not use drugs.  family history includes Hypertension in his father; Kidney cancer in his mother.  Allergies  Allergen Reactions   Livalo [Pitavastatin] Other (See Comments)    Cramping and back pain   Haldol [Haloperidol] Other (See Comments)    Family does not like his affect when he is given this - makes him a "zombie."   Other Diarrhea and Other (See Comments)    Unnamed patch and tablets for dementia caused diarrhea (Possibly galantamine, from outside source)        PHYSICAL EXAMINATION: Vital signs: BP 116/62   Pulse (!) 58   Wt 150 lb (68 kg)   BMI 22.81 kg/m   Constitutional: Elderly male somewhat frail, no acute distress.  In wheelchair Psychiatric: alert and oriented x3, cooperative Eyes: Anicteric Abdomen: Not reexamined Extremities: no obvious lower extremity edema  bilaterally Skin: no jaundice or relevant lesions on visible extremities  ASSESSMENT:  1.  History of recurrent C. difficile diarrhea.  Resolution of diarrhea on vancomycin 2.  Chronic incontinence.  Intermittent.  Continues 3.  Multiple significant medical problems   PLAN:  1.  Discussion on C. difficile.  Multiple questions toes.  Multiple questions answered 2.  Taper vancomycin.  Recommended vancomycin 125 mg 3 times daily for 2 weeks, then twice daily for 2 weeks, then once  daily for 2 weeks, then once every other day for 2 weeks, then stop 3.  Continue to wear protective undergarments 4.  Ongoing general medical care with Dr. Joylene Draft Total time of 30 minutes was spent preparing to see the patient, obtaining interval history, performing medically appropriate physical examination, counseling and educating the patient and his wife and his daughter regarding his above listed issues.  Directing medical therapy and documenting clinical information in the health record

## 2023-01-20 NOTE — Patient Instructions (Addendum)
We have sent the following medications to your pharmacy for you to pick up at your convenience: Vancomycin    Vancomycin  Taper as instructed:   1 tablet three times daily for 2 weeks, then 1 tablet two times daily for 2 weeks, then 1 tablet once daily for 2 weeks, then 1 tablet every other day for 2 weeks, then stop.   _______________________________________________________  If your blood pressure at your visit was 140/90 or greater, please contact your primary care physician to follow up on this.  _______________________________________________________  If you are age 87 or older, your body mass index should be between 23-30. Your Body mass index is 22.81 kg/m. If this is out of the aforementioned range listed, please consider follow up with your Primary Care Provider.  If you are age 87 or younger, your body mass index should be between 19-25. Your Body mass index is 22.81 kg/m. If this is out of the aformentioned range listed, please consider follow up with your Primary Care Provider.   ________________________________________________________  The [facility] providers would like to encourage you to use [facility] to communicate with providers for non-urgent requests or questions.  Due to long hold times on the telephone, sending your provider a message by [facility] may be a faster and more efficient way to get a response.  Please allow 48 business hours for a response.  Please remember that this is for non-urgent requests.  _______________________________________________________  Follow up as needed.   Thank you for choosing me and [facility].  Dr. Scarlette Shorts

## 2023-02-03 ENCOUNTER — Emergency Department (HOSPITAL_COMMUNITY): Payer: No Typology Code available for payment source

## 2023-02-03 ENCOUNTER — Other Ambulatory Visit: Payer: Self-pay

## 2023-02-03 ENCOUNTER — Emergency Department (HOSPITAL_COMMUNITY)
Admission: EM | Admit: 2023-02-03 | Discharge: 2023-02-03 | Disposition: A | Discharge status: Home / Self Care | Payer: No Typology Code available for payment source | Attending: Emergency Medicine | Admitting: Emergency Medicine

## 2023-02-03 DIAGNOSIS — Y9301 Activity, walking, marching and hiking: Secondary | ICD-10-CM | POA: Diagnosis not present

## 2023-02-03 DIAGNOSIS — Z7901 Long term (current) use of anticoagulants: Secondary | ICD-10-CM | POA: Diagnosis not present

## 2023-02-03 DIAGNOSIS — G4489 Other headache syndrome: Secondary | ICD-10-CM | POA: Diagnosis not present

## 2023-02-03 DIAGNOSIS — S0990XA Unspecified injury of head, initial encounter: Secondary | ICD-10-CM | POA: Diagnosis present

## 2023-02-03 DIAGNOSIS — S60511A Abrasion of right hand, initial encounter: Secondary | ICD-10-CM | POA: Insufficient documentation

## 2023-02-03 DIAGNOSIS — R0781 Pleurodynia: Secondary | ICD-10-CM | POA: Diagnosis not present

## 2023-02-03 DIAGNOSIS — M542 Cervicalgia: Secondary | ICD-10-CM | POA: Diagnosis not present

## 2023-02-03 DIAGNOSIS — Z043 Encounter for examination and observation following other accident: Secondary | ICD-10-CM | POA: Diagnosis not present

## 2023-02-03 DIAGNOSIS — M549 Dorsalgia, unspecified: Secondary | ICD-10-CM | POA: Diagnosis not present

## 2023-02-03 DIAGNOSIS — W19XXXA Unspecified fall, initial encounter: Secondary | ICD-10-CM | POA: Diagnosis not present

## 2023-02-03 DIAGNOSIS — W01198A Fall on same level from slipping, tripping and stumbling with subsequent striking against other object, initial encounter: Secondary | ICD-10-CM | POA: Diagnosis not present

## 2023-02-03 DIAGNOSIS — I10 Essential (primary) hypertension: Secondary | ICD-10-CM | POA: Diagnosis not present

## 2023-02-03 DIAGNOSIS — S0081XA Abrasion of other part of head, initial encounter: Secondary | ICD-10-CM | POA: Insufficient documentation

## 2023-02-03 DIAGNOSIS — R519 Headache, unspecified: Secondary | ICD-10-CM | POA: Diagnosis not present

## 2023-02-03 MED ORDER — HYDROCODONE-ACETAMINOPHEN 5-325 MG PO TABS: 1.0000 | ORAL_TABLET | Freq: Four times a day (QID) | ORAL | 0 refills | Status: DC | PRN

## 2023-02-03 MED ORDER — ACETAMINOPHEN 500 MG PO TABS
1000.0000 mg | ORAL_TABLET | Freq: Once | ORAL | Status: AC
  Administered 2023-02-03: 1000 mg via ORAL
  Filled 2023-02-03: qty 2

## 2023-02-03 NOTE — ED Triage Notes (Signed)
Pt BIBA for fall, As per wife patient was walking with walker when he fell forward hitting head. No LOC. On Eliquis. Denies pain at this time.

## 2023-02-03 NOTE — ED Provider Notes (Signed)
[facility] Provider Note   CSN: [id number] Arrival date & time: 02/03/23  2058     History  Chief Complaint  Patient presents with   Paul [name] is a 89 y.o. male.  89 year old male with prior medical history as detailed below presents for evaluation.  Patient uses a walker at baseline for ambulation assistance.  Patient is on Eliquis.  He did take his evening dose prior to his fall.  He reports that he lost his balance while walking with walker.  He struck his head on furniture.  He denies LOC.  He denies neck pain.  He complains primarily of abrasion and mild pain to the anterior right forehead.  He also complains of posterior right rib pain.  He denies shortness of breath.  He denies extremity pain.  He has a superficial abrasion to the right hand knuckles.  He declines pain medication.  He reports that his tetanus is up-to-date.  The history is provided by the patient and medical records.       Home Medications Prior to Admission medications   Medication Sig Start Date End Date Taking? Authorizing Provider  acetaminophen (TYLENOL) 500 MG tablet Take 500-1,000 mg by mouth daily as needed for mild pain or headache.    [provider]  apixaban (ELIQUIS) 5 MG TABS tablet Take 1 tablet (5 mg total) by mouth 2 (two) times daily. Patient taking differently: Take 5 mg by mouth 2 (two) times daily. Taking half a day 09/21/22   Elgergawy, Silver Huguenin, MD  donepezil (ARICEPT) 5 MG tablet Take 5 mg by mouth daily.    [provider]  Ensure (ENSURE) Take 237 mLs by mouth daily.    [provider]  escitalopram (LEXAPRO) 20 MG tablet Take 20 mg by mouth daily. 01/24/20   [provider]  ezetimibe (ZETIA) 10 MG tablet Take 10 mg by mouth daily.    [provider]  isosorbide mononitrate (IMDUR) 60 MG 24 hr tablet Take 1 tablet (60 mg total) by mouth every morning. do not give if systolic  blood pressure < 130 Patient taking differently: Take 60 mg by mouth daily. 02/01/22   Patrecia Pour, MD  loperamide (IMODIUM) 2 MG capsule Take 2 mg by mouth once.    [provider]  Probiotic Product (PROBIOTIC PO) Take 1 tablet by mouth daily.    [provider]  saccharomyces boulardii (FLORASTOR) 250 MG capsule Take 1 capsule (250 mg total) by mouth 2 (two) times daily. 09/21/22   Elgergawy, Silver Huguenin, MD  vancomycin (VANCOCIN) 125 MG capsule Take 1 capsule (125 mg total) by mouth 3 (three) times daily for 14 days, THEN 1 capsule (125 mg total) 2 (two) times daily for 14 days, THEN 1 capsule (125 mg total) daily for 14 days, THEN 1 capsule (125 mg total) every other day for 14 days. 01/20/23 03/17/23  Irene Shipper, MD  simvastatin (ZOCOR) 20 MG tablet Take 20 mg by mouth every evening.  02/27/12  [provider]      Allergies    Livalo [pitavastatin], Haldol [haloperidol], and Other    Review of Systems   Review of Systems  All other systems reviewed and are negative.   Physical Exam Updated Vital Signs BP (!) 174/78 (BP Location: Right Arm)   Pulse 64   Temp 97.9 F (36.6 C) (Oral)   Resp 16   SpO2 95%  Physical  Exam Vitals and nursing note reviewed.  Constitutional:      General: He is not in acute distress.    Appearance: Normal appearance. He is well-developed.  HENT:     Head: Normocephalic.     Comments: Superficial abrasion and contusion to the right forehead. Eyes:     Conjunctiva/sclera: Conjunctivae normal.     Pupils: Pupils are equal, round, and reactive to light.  Cardiovascular:     Rate and Rhythm: Normal rate and regular rhythm.     Heart sounds: Normal heart sounds.  Pulmonary:     Effort: Pulmonary effort is normal. No respiratory distress.     Breath sounds: Normal breath sounds.  Abdominal:     General: There is no distension.     Palpations: Abdomen is soft.     Tenderness: There is no abdominal tenderness.   Musculoskeletal:        General: No deformity. Normal range of motion.     Cervical back: Normal range of motion and neck supple.  Skin:    General: Skin is warm and dry.     Comments: Superficial abrasion to the right hand overlying the knuckles.  Neurological:     General: No focal deficit present.     Mental Status: He is alert and oriented to person, place, and time. Mental status is at baseline.     Cranial Nerves: No cranial nerve deficit.     Sensory: No sensory deficit.     Motor: No weakness.     Coordination: Coordination normal.     ED Results / Procedures / Treatments   Labs (all labs ordered are listed, but only abnormal results are displayed) Labs Reviewed - No data to display  EKG None  Radiology No results found.  Procedures Procedures    Medications Ordered in ED Medications - No data to display  ED Course/ Medical Decision Making/ A&P                             Medical Decision Making Amount and/or Complexity of Data Reviewed Radiology: ordered.  Risk OTC drugs. Prescription drug management.    Medical Screen Complete  This patient presented to the ED with complaint of fall, head injury.  This complaint involves an extensive number of treatment options. The initial differential diagnosis includes, but is not limited to, trauma related to fall  This presentation is: Acute, Self-Limited, Previously Undiagnosed, Uncertain Prognosis, Complicated, Systemic Symptoms, and Threat to Life/Bodily Function  Patient presents after mechanical fall while ambulating with walker.  He did strike his head.  He is on Eliquis.  Patient without clear evidence of significant traumatic injury on exam.  Imaging obtained is without acute abnormality.  CT imaging of head and C-spine are without acute abnormality.  Patient did complain of posterior right rib pain.  No clear evidence of rib fracture seen on plain film.  However, will provide patient with  incentive spirometer and advised patient on pain control methods for suspected rib fracture.  Importance of close follow-up is stressed.  Strict return precautions given and understood.  Additional history obtained:  Additional history obtained from EMS and Family External records from outside sources obtained and reviewed including prior ED visits and prior Inpatient records.    Imaging Studies ordered:  I ordered imaging studies including CT head, CT C-spine, plain films of right ribs and pelvis I independently visualized and interpreted obtained imaging which showed NAD I  agree with the radiologist interpretation.   Cardiac Monitoring:  The patient was maintained on a cardiac monitor.  I personally viewed and interpreted the cardiac monitor which showed an underlying rhythm of: NSR   Medicines ordered:  I ordered medication including Tylenol for pain Reevaluation of the patient after these medicines showed that the patient: improved   Problem List / ED Course:  Fall, head injury   Reevaluation:  After the interventions noted above, I reevaluated the patient and found that they have: improved  Disposition:  After consideration of the diagnostic results and the patients response to treatment, I feel that the patent would benefit from close outpatient follow-up.          Final Clinical Impression(s) / ED Diagnoses Final diagnoses:  Fall, initial encounter  Injury of head, initial encounter    Rx / DC Orders ED Discharge Orders     None         Valarie Merino, MD 02/03/23 2307

## 2023-02-03 NOTE — Discharge Instructions (Signed)
Return for any problem.  ?

## 2023-02-04 DIAGNOSIS — S2249XA Multiple fractures of ribs, unspecified side, initial encounter for closed fracture: Secondary | ICD-10-CM | POA: Diagnosis not present

## 2023-02-10 ENCOUNTER — Telehealth: Payer: Self-pay

## 2023-02-10 DIAGNOSIS — I639 Cerebral infarction, unspecified: Secondary | ICD-10-CM | POA: Diagnosis not present

## 2023-02-10 DIAGNOSIS — L8990 Pressure ulcer of unspecified site, unspecified stage: Secondary | ICD-10-CM | POA: Diagnosis not present

## 2023-02-10 DIAGNOSIS — E785 Hyperlipidemia, unspecified: Secondary | ICD-10-CM | POA: Diagnosis not present

## 2023-02-10 DIAGNOSIS — F0390 Unspecified dementia without behavioral disturbance: Secondary | ICD-10-CM | POA: Diagnosis not present

## 2023-02-10 NOTE — Telephone Encounter (Signed)
     Patient  visit on 2/21  at [facility]  Have you been able to follow up with your primary care physician? Yes   The patient was or was not able to obtain any needed medicine or equipment. Yes   Are there diet recommendations that you are having difficulty following? Na   Patient expresses understanding of discharge instructions and education provided has no other needs at this time.  Yes     [facility] [phone] [address], [place],  [address] Phone: [phone] Email: [email or web address]

## 2023-02-13 ENCOUNTER — Other Ambulatory Visit: Payer: Self-pay

## 2023-02-13 ENCOUNTER — Telehealth: Payer: Self-pay | Admitting: Internal Medicine

## 2023-02-13 MED ORDER — VANCOMYCIN HCL 125 MG PO CAPS: ORAL_CAPSULE | ORAL | 0 refills | Status: AC

## 2023-02-13 NOTE — Telephone Encounter (Signed)
Spoke with daughter and she is aware and script sent to pharmacy.

## 2023-02-13 NOTE — Telephone Encounter (Signed)
Pts daughter calling, states her father just started the vancomycin '125mg'$  daily and is supposed to take it daily for 14 days then every other day for 14 days. She reports when he tries to taper off he always winds up having to go back up. Report that with his insurance it does not matter what quantity of pills it is always 100.00. They will need a refill for him to complete the taper but is wanting to know if the quantity could be sent in for more pills since he always has to go back up on the dose. Please advise.

## 2023-02-13 NOTE — Telephone Encounter (Signed)
Inbound call from patient daughter requesting a phone call from a nurse regarding medication vancomycin .Please advise

## 2023-03-03 ENCOUNTER — Telehealth: Payer: Self-pay | Admitting: Internal Medicine

## 2023-03-03 NOTE — Telephone Encounter (Signed)
Vaughan Basta I am just seeing this at this time, can you please call them in the morning with a few additional questions.  Dr. Henrene Pastor had recommended the following vancomycin taper: Recommended vancomycin 125 mg 3 times daily for 2 weeks, then twice daily for 2 weeks, then once daily for 2 weeks, then once every other day for 2 weeks, then stop   Can you clarify where he was on this taper.  Did he stop it completely or he is he only at 1/day or did he go to every other day yet etc.?  Has he been trying Imodium yet as previously recommended?  He can take an Imodium once every few days or daily.  If he would like to go back to 1 pill/day for another week or 2 and then taper to every other week of the vancomycin that is okay if he is concerned and wants to prolong the taper a bit more.

## 2023-03-03 NOTE — Telephone Encounter (Signed)
PT daughter is calling to discuss vancomycin. Tomorrow is the last day of the medication and he is still having diarrhea. Should he continue taking one a day or stop completely. Please advise.

## 2023-03-03 NOTE — Telephone Encounter (Signed)
Paul [name] Pts daughter called back and states that tomorrow he will take his last vancomycin pill. States he is still having some diarrhea but not every day. They were wondering if they should get more vanc and continue taking 1 pill a day or possibly go up to 2 per day. Please advise as DOD.

## 2023-03-04 NOTE — Telephone Encounter (Signed)
Spoke with patients daughter and they were supposed to finish the taper today. They have not tried any imodium. She will discuss further with her mother, pt is now on hospice and bedridden. They do have enough vancomycin to do the extended taper of daily for 2 weeks and every other day for a week. She states they will discuss and decide what they want to do with her father.

## 2023-03-24 ENCOUNTER — Telehealth: Payer: Self-pay | Admitting: Internal Medicine

## 2023-03-24 NOTE — Telephone Encounter (Signed)
PT daughter wants to know if PT should continue vancomycin 1x a day or slowly come off of it. It is helping and there has been a great change in BM. Please advise.

## 2023-03-24 NOTE — Telephone Encounter (Signed)
Pts daughter is calling requesting that pt stay on 1 vancomycin pill a day. States his stool are better, one day the stool may be firm, the next day a little loose. They have not completely tapered him off and are afraid to do so. Reports they have plenty left and want to know if it is safe to just leave him on vanco daily. Please advise.

## 2023-03-24 NOTE — Telephone Encounter (Signed)
Spoke with pts daughter and she is aware. 

## 2023-03-24 NOTE — Telephone Encounter (Signed)
Okay to leave him on 1 vancomycin a day

## 2023-05-20 NOTE — Telephone Encounter (Signed)
Pt daughter is calling requesting refill on Vancomycin states the patient has been off of it for 4 days and his watery stool has recurred for 3 days now. Please advise

## 2023-05-21 ENCOUNTER — Other Ambulatory Visit: Payer: Self-pay

## 2023-05-21 MED ORDER — VANCOMYCIN HCL 125 MG PO CAPS: 125.0000 mg | ORAL_CAPSULE | Freq: Four times a day (QID) | ORAL | 3 refills | Status: DC

## 2023-05-21 NOTE — Addendum Note (Signed)
Addended by: Selinda Michaels R on: 05/21/2023 04:00 PM   Modules accepted: Orders

## 2023-05-21 NOTE — Telephone Encounter (Signed)
Left message for pt to call back  °

## 2023-05-21 NOTE — Telephone Encounter (Signed)
Spoke with pts daughter and she is aware. Prescription sent to pharmacy.

## 2023-05-21 NOTE — Telephone Encounter (Signed)
Daughter states pt has been off vancomycin for a week. For the past 3-4 days he has had some soft stools during the day and then at night he is having heavy watery stools and they report it has a terrible odor. Daughter is wanting to know if Dr. Marina Goodell will prescribe vancomycin again. Pt has no fever. Please advise.

## 2023-05-21 NOTE — Telephone Encounter (Signed)
PT returning call

## 2023-05-21 NOTE — Telephone Encounter (Signed)
Sure. Prescribe Vanco 125 mg po qid x 2 weeks. 3 refills

## 2024-08-15 DEATH — deceased
